# Patient Record
Sex: Female | Born: 1960
Health system: Southern US, Community
[De-identification: ages and names within clinical notes are randomized; demographics above are authoritative.]

## PROBLEM LIST (undated history)

## (undated) DIAGNOSIS — H905 Unspecified sensorineural hearing loss: Secondary | ICD-10-CM

## (undated) DIAGNOSIS — Z9221 Personal history of antineoplastic chemotherapy: Secondary | ICD-10-CM

## (undated) DIAGNOSIS — E785 Hyperlipidemia, unspecified: Secondary | ICD-10-CM

## (undated) DIAGNOSIS — Z923 Personal history of irradiation: Secondary | ICD-10-CM

## (undated) DIAGNOSIS — C50919 Malignant neoplasm of unspecified site of unspecified female breast: Secondary | ICD-10-CM

## (undated) DIAGNOSIS — Z973 Presence of spectacles and contact lenses: Secondary | ICD-10-CM

## (undated) HISTORY — DX: Personal history of antineoplastic chemotherapy: Z92.21

## (undated) HISTORY — PX: TONSILLECTOMY: SUR1361

## (undated) HISTORY — DX: Personal history of irradiation: Z92.3

## (undated) HISTORY — PX: AUGMENTATION MAMMAPLASTY: SUR837

## (undated) HISTORY — DX: Unspecified sensorineural hearing loss: H90.5

## (undated) HISTORY — DX: Malignant neoplasm of unspecified site of unspecified female breast: C50.919

## (undated) HISTORY — DX: Hyperlipidemia, unspecified: E78.5

---

## 1999-08-26 ENCOUNTER — Other Ambulatory Visit: Admission: RE | Admit: 1999-08-26 | Discharge: 1999-08-26 | Payer: Self-pay | Admitting: Obstetrics and Gynecology

## 2000-09-16 ENCOUNTER — Other Ambulatory Visit: Admission: RE | Admit: 2000-09-16 | Discharge: 2000-09-16 | Payer: Self-pay | Admitting: Obstetrics and Gynecology

## 2000-11-09 DIAGNOSIS — C50919 Malignant neoplasm of unspecified site of unspecified female breast: Secondary | ICD-10-CM

## 2000-11-09 HISTORY — PX: BREAST SURGERY: SHX581

## 2000-11-09 HISTORY — PX: BREAST LUMPECTOMY: SHX2

## 2000-11-09 HISTORY — DX: Malignant neoplasm of unspecified site of unspecified female breast: C50.919

## 2000-11-22 ENCOUNTER — Other Ambulatory Visit: Admission: RE | Admit: 2000-11-22 | Discharge: 2000-11-22 | Payer: Self-pay | Admitting: Surgery

## 2000-11-30 ENCOUNTER — Encounter: Admission: RE | Admit: 2000-11-30 | Discharge: 2001-02-28 | Payer: Self-pay | Admitting: Radiation Oncology

## 2000-12-03 ENCOUNTER — Encounter: Admission: RE | Admit: 2000-12-03 | Discharge: 2000-12-03 | Payer: Self-pay | Admitting: Surgery

## 2000-12-03 ENCOUNTER — Encounter: Payer: Self-pay | Admitting: Surgery

## 2000-12-08 ENCOUNTER — Encounter (INDEPENDENT_AMBULATORY_CARE_PROVIDER_SITE_OTHER): Payer: Self-pay | Admitting: Specialist

## 2000-12-08 ENCOUNTER — Ambulatory Visit (HOSPITAL_COMMUNITY): Admission: RE | Admit: 2000-12-08 | Discharge: 2000-12-09 | Payer: Self-pay | Admitting: Surgery

## 2000-12-08 ENCOUNTER — Encounter: Payer: Self-pay | Admitting: Surgery

## 2001-03-28 ENCOUNTER — Ambulatory Visit: Admission: RE | Admit: 2001-03-28 | Discharge: 2001-06-08 | Payer: Self-pay | Admitting: Radiation Oncology

## 2001-06-09 ENCOUNTER — Ambulatory Visit: Admission: RE | Admit: 2001-06-09 | Discharge: 2001-09-07 | Payer: Self-pay | Admitting: Radiation Oncology

## 2001-11-18 ENCOUNTER — Other Ambulatory Visit: Admission: RE | Admit: 2001-11-18 | Discharge: 2001-11-18 | Payer: Self-pay | Admitting: Obstetrics and Gynecology

## 2002-12-14 ENCOUNTER — Other Ambulatory Visit: Admission: RE | Admit: 2002-12-14 | Discharge: 2002-12-14 | Payer: Self-pay | Admitting: Obstetrics and Gynecology

## 2003-11-19 ENCOUNTER — Encounter: Admission: RE | Admit: 2003-11-19 | Discharge: 2003-11-19 | Payer: Self-pay | Admitting: Oncology

## 2004-01-31 ENCOUNTER — Other Ambulatory Visit: Admission: RE | Admit: 2004-01-31 | Discharge: 2004-01-31 | Payer: Self-pay | Admitting: Obstetrics and Gynecology

## 2004-09-19 ENCOUNTER — Ambulatory Visit: Payer: Self-pay | Admitting: Oncology

## 2004-11-19 ENCOUNTER — Ambulatory Visit: Payer: Self-pay | Admitting: Oncology

## 2005-01-28 ENCOUNTER — Inpatient Hospital Stay (HOSPITAL_COMMUNITY): Admission: AD | Admit: 2005-01-28 | Discharge: 2005-02-01 | Payer: Self-pay | Admitting: Obstetrics and Gynecology

## 2005-02-02 ENCOUNTER — Encounter: Admission: RE | Admit: 2005-02-02 | Discharge: 2005-03-04 | Payer: Self-pay | Admitting: Obstetrics and Gynecology

## 2005-03-06 ENCOUNTER — Other Ambulatory Visit: Admission: RE | Admit: 2005-03-06 | Discharge: 2005-03-06 | Payer: Self-pay | Admitting: Obstetrics and Gynecology

## 2005-03-18 ENCOUNTER — Ambulatory Visit: Payer: Self-pay | Admitting: Oncology

## 2005-04-07 ENCOUNTER — Encounter: Admission: RE | Admit: 2005-04-07 | Discharge: 2005-04-07 | Payer: Self-pay | Admitting: Surgery

## 2005-05-11 ENCOUNTER — Encounter: Admission: RE | Admit: 2005-05-11 | Discharge: 2005-05-11 | Payer: Self-pay | Admitting: Surgery

## 2005-07-07 ENCOUNTER — Ambulatory Visit: Payer: Self-pay | Admitting: Oncology

## 2006-01-05 ENCOUNTER — Ambulatory Visit: Payer: Self-pay | Admitting: Oncology

## 2006-05-18 ENCOUNTER — Encounter: Admission: RE | Admit: 2006-05-18 | Discharge: 2006-05-18 | Payer: Self-pay | Admitting: Oncology

## 2006-06-24 ENCOUNTER — Ambulatory Visit: Payer: Self-pay | Admitting: Oncology

## 2006-06-30 LAB — COMPREHENSIVE METABOLIC PANEL
AST: 14 U/L (ref 0–37)
Alkaline Phosphatase: 66 U/L (ref 39–117)
BUN: 17 mg/dL (ref 6–23)
CO2: 27 mEq/L (ref 19–32)
Chloride: 108 mEq/L (ref 96–112)
Total Bilirubin: 0.4 mg/dL (ref 0.3–1.2)
Total Protein: 6.7 g/dL (ref 6.0–8.3)

## 2006-06-30 LAB — CBC WITH DIFFERENTIAL/PLATELET
BASO%: 0.3 % (ref 0.0–2.0)
EOS%: 1.7 % (ref 0.0–7.0)
Eosinophils Absolute: 0.1 10*3/uL (ref 0.0–0.5)
HGB: 13.7 g/dL (ref 11.6–15.9)
MONO%: 8.5 % (ref 0.0–13.0)
WBC: 3.2 10*3/uL — ABNORMAL LOW (ref 3.9–10.0)

## 2007-05-23 ENCOUNTER — Encounter: Admission: RE | Admit: 2007-05-23 | Discharge: 2007-05-23 | Payer: Self-pay | Admitting: Surgery

## 2007-06-24 ENCOUNTER — Ambulatory Visit: Payer: Self-pay | Admitting: Oncology

## 2007-06-28 LAB — CBC WITH DIFFERENTIAL/PLATELET
EOS%: 3.7 % (ref 0.0–7.0)
Eosinophils Absolute: 0.2 10*3/uL (ref 0.0–0.5)
HCT: 41.5 % (ref 34.8–46.6)
LYMPH%: 22 % (ref 14.0–48.0)
MCHC: 35.4 g/dL (ref 32.0–36.0)
MONO#: 0.3 10*3/uL (ref 0.1–0.9)
NEUT#: 3.5 10*3/uL (ref 1.5–6.5)
Platelets: ADEQUATE 10*3/uL (ref 145–400)
RBC: 4.66 10*6/uL (ref 3.70–5.32)

## 2007-06-28 LAB — COMPREHENSIVE METABOLIC PANEL
ALT: 18 U/L (ref 0–35)
AST: 18 U/L (ref 0–37)
Alkaline Phosphatase: 83 U/L (ref 39–117)
Calcium: 9.5 mg/dL (ref 8.4–10.5)
Creatinine, Ser: 0.72 mg/dL (ref 0.40–1.20)
Potassium: 4.3 mEq/L (ref 3.5–5.3)
Sodium: 140 mEq/L (ref 135–145)

## 2007-07-20 ENCOUNTER — Encounter: Admission: RE | Admit: 2007-07-20 | Discharge: 2007-07-20 | Payer: Self-pay | Admitting: Oncology

## 2007-07-21 ENCOUNTER — Encounter: Admission: RE | Admit: 2007-07-21 | Discharge: 2007-07-21 | Payer: Self-pay | Admitting: Internal Medicine

## 2008-05-24 ENCOUNTER — Encounter: Admission: RE | Admit: 2008-05-24 | Discharge: 2008-05-24 | Payer: Self-pay | Admitting: Oncology

## 2008-06-24 ENCOUNTER — Ambulatory Visit: Payer: Self-pay | Admitting: Oncology

## 2008-06-27 LAB — COMPREHENSIVE METABOLIC PANEL
ALT: 18 U/L (ref 0–35)
AST: 15 U/L (ref 0–37)
Albumin: 4.1 g/dL (ref 3.5–5.2)
CO2: 26 mEq/L (ref 19–32)
Creatinine, Ser: 0.76 mg/dL (ref 0.40–1.20)
Glucose, Bld: 111 mg/dL — ABNORMAL HIGH (ref 70–99)
Potassium: 3.9 mEq/L (ref 3.5–5.3)

## 2008-06-27 LAB — CBC WITH DIFFERENTIAL/PLATELET
Eosinophils Absolute: 0.1 10*3/uL (ref 0.0–0.5)
HGB: 13.7 g/dL (ref 11.6–15.9)
MCH: 32.3 pg (ref 26.0–34.0)
MCHC: 35.3 g/dL (ref 32.0–36.0)
MCV: 91.6 fL (ref 81.0–101.0)
MONO%: 7.5 % (ref 0.0–13.0)
NEUT#: 2.1 10*3/uL (ref 1.5–6.5)
NEUT%: 61 % (ref 39.6–76.8)
RBC: 4.25 10*6/uL (ref 3.70–5.32)
WBC: 3.5 10*3/uL — ABNORMAL LOW (ref 3.9–10.0)
lymph#: 1 10*3/uL (ref 0.9–3.3)

## 2009-06-03 ENCOUNTER — Encounter: Admission: RE | Admit: 2009-06-03 | Discharge: 2009-06-03 | Payer: Self-pay | Admitting: Oncology

## 2009-06-20 ENCOUNTER — Ambulatory Visit: Payer: Self-pay | Admitting: Oncology

## 2009-06-24 LAB — COMPREHENSIVE METABOLIC PANEL
ALT: 19 U/L (ref 0–35)
AST: 16 U/L (ref 0–37)
Albumin: 4.3 g/dL (ref 3.5–5.2)
BUN: 15 mg/dL (ref 6–23)
CO2: 25 mEq/L (ref 19–32)
Calcium: 9.6 mg/dL (ref 8.4–10.5)
Chloride: 108 mEq/L (ref 96–112)
Sodium: 142 mEq/L (ref 135–145)
Total Bilirubin: 0.4 mg/dL (ref 0.3–1.2)
Total Protein: 6.8 g/dL (ref 6.0–8.3)

## 2009-06-24 LAB — CBC WITH DIFFERENTIAL/PLATELET
BASO%: 0.5 % (ref 0.0–2.0)
EOS%: 3.6 % (ref 0.0–7.0)
HGB: 13.9 g/dL (ref 11.6–15.9)
MCHC: 34.4 g/dL (ref 31.5–36.0)
MCV: 91.4 fL (ref 79.5–101.0)
MONO#: 0.3 10*3/uL (ref 0.1–0.9)
MONO%: 8.1 % (ref 0.0–14.0)
NEUT%: 51.7 % (ref 38.4–76.8)
RBC: 4.43 10*6/uL (ref 3.70–5.45)
WBC: 3.6 10*3/uL — ABNORMAL LOW (ref 3.9–10.3)
lymph#: 1.3 10*3/uL (ref 0.9–3.3)

## 2009-07-26 ENCOUNTER — Encounter: Admission: RE | Admit: 2009-07-26 | Discharge: 2009-07-26 | Payer: Self-pay | Admitting: Oncology

## 2010-06-10 ENCOUNTER — Encounter: Admission: RE | Admit: 2010-06-10 | Discharge: 2010-06-10 | Payer: Self-pay | Admitting: Obstetrics and Gynecology

## 2010-06-19 ENCOUNTER — Ambulatory Visit: Payer: Self-pay | Admitting: Oncology

## 2010-07-22 ENCOUNTER — Ambulatory Visit: Payer: Self-pay | Admitting: Oncology

## 2010-09-17 ENCOUNTER — Ambulatory Visit: Payer: Self-pay | Admitting: Oncology

## 2010-09-19 LAB — MORPHOLOGY: PLT EST: ADEQUATE

## 2010-09-19 LAB — CBC WITH DIFFERENTIAL/PLATELET
EOS%: 2.1 % (ref 0.0–7.0)
HCT: 41.8 % (ref 34.8–46.6)
MCH: 32.3 pg (ref 25.1–34.0)
MCHC: 34.6 g/dL (ref 31.5–36.0)
MONO#: 0.3 10*3/uL (ref 0.1–0.9)
MONO%: 6.7 % (ref 0.0–14.0)
NEUT%: 61.8 % (ref 38.4–76.8)
RDW: 12.7 % (ref 11.2–14.5)
WBC: 3.8 10*3/uL — ABNORMAL LOW (ref 3.9–10.3)

## 2010-11-29 ENCOUNTER — Other Ambulatory Visit: Payer: Self-pay | Admitting: Oncology

## 2010-11-29 DIAGNOSIS — Z1239 Encounter for other screening for malignant neoplasm of breast: Secondary | ICD-10-CM

## 2011-03-27 NOTE — Discharge Summary (Signed)
NAMEBRITAINY, KOZUB NO.:  [id number]   MEDICAL RECORD NO.:  [id number]          PATIENT TYPE:  INP   LOCATION:  9145                          FACILITY:  [facility]   PHYSICIAN:  Freddy Finner, M.D.   DATE OF BIRTH:  Jul 22, 1961   DATE OF ADMISSION:  01/28/2005  DATE OF DISCHARGE:  02/01/2005                                 DISCHARGE SUMMARY   ADMISSION DIAGNOSES:  1.  Intrauterine pregnancy at terms.  2. Spontaneous rupture of membranes.   DISCHARGE DIAGNOSES:  1.  Status post low transverse cesarean section secondary to cephalopelvic      disproportion.  2. Viable female infant.   PROCEDURE:  Primary low transverse cesarean section.   REASON FOR ADMISSION:  Please see written history and physical.   HOSPITAL COURSE:  The patient is a 50 year old primigravida that was  admitted to [facility] with spontaneous rupture of  membranes.  The patient was noted to have minimal labor.  The patient had  history of positive group B beta strep and IV antibiotics were administered  prophylactically.  Pitocin was started per protocol to augment her labor.  The patient was also administered epidural for patient's comfort.  Fetal  heart tones were in the 140s with good variability.  The patient did  progress to full dilatation.  After three hours of pushing, fetal vertex  failed to descend beyond a +2 station.  The decision was made to proceed  with primary low transverse cesarean section for the diagnosis of  cephalopelvic disproportion.  The patient was then transferred to the  operating room where epidural was dosed to an adequate surgical level.  The  low transverse incision was made with a delivery of a viable female infant  weighing 7 pounds 11 ounces with Apgars of 8 at 1 minute, 9 at 5 minutes.  The patient tolerated the procedure well and was taken to the recovery room  in stable condition.  Later that morning, the patient was transferred to the  mother/baby unit.  Vital signs were stable.  She was afebrile.  Abdomen was  soft.  Fundus was firm and nontender.  Abdominal dressing was noted to be  clean, dry, and intact.  Foley was to straight drain with adequate output.  On postoperative day #1, the patient was without complaint.  Vital signs  were stable.  Abdomen is soft.  Fundus is firm and nontender.  Abdominal  dressing was partially revealing incision that was clean, dry, and intact.   Laboratory findings revealed hemoglobin of 12.5, WBC count of 11.9, platelet  count of 177,000.  On postoperative day #2, the patient was without  complaint.  Vital signs remained stable.  Fundus was firm and nontender.  Incision was clean, dry, and intact.  She is ambulating well and tolerating  a regular diet without complaints of nausea or vomiting.  On postoperative  day #3, the patient was without complaints.  Vital signs remained stable.  She was afebrile.  Fundus was firm and nontender.  The incision was clean,  dry, and intact.  Staples  were removed.  The patient was discharged home.   CONDITION ON DISCHARGE:  Good.   DIET:  Regular as tolerated.   ACTIVITY:  No heavy lifting, no driving x2 weeks, no vaginal entry.   FOLLOW UP:  The patient is to follow up in the office in one to two weeks  for incision check.  She is to call for temperature greater than 100  degrees, persistent nausea or vomiting, heavy vaginal bleeding, and/or  redness or drainage from the incision site.   DISCHARGE MEDICATIONS:  1.  Percocet 5/325 #30, 1 p.o. every 4-6h. p.r.n.  2. Ibuprofen 600 mg every      6 hours.  3. Prenatal vitamins 1 p.o. daily.  4. Colace 1 p.o. daily.      CC/MEDQ  D:  03/06/2005  T:  03/06/2005  Job:  [id number]

## 2011-03-27 NOTE — Op Note (Signed)
NAMEBRITAIN, ANAGNOS NO.:  [id number]   MEDICAL RECORD NO.:  [id number]          PATIENT TYPE:  INP   LOCATION:  9145                          FACILITY:  [facility]   PHYSICIAN:  Duke Salvia. Marcelle Overlie, M.D.DATE OF BIRTH:  Apr 22, 1961   DATE OF PROCEDURE:  01/29/2005  DATE OF DISCHARGE:                                 OPERATIVE REPORT   PREOPERATIVE DIAGNOSIS:  Cephalopelvic disproportion, failure to descend.   POSTOPERATIVE DIAGNOSIS:  Cephalopelvic disproportion, failure to descend.   PROCEDURE:  Primary low transverse cesarean section.   SURGEON:  Duke Salvia. Marcelle Overlie, M.D.   ANESTHESIA:  Epidural.   COMPLICATIONS:  None.   DRAINS:  Foley catheter.   ESTIMATED BLOOD LOSS:  800.   PROCEDURE AND FINDINGS:  This patient had a three-hour second stage.  Increased caput was noted.  Stable fetal heart rate throughout.  Failure to  descend beyond +2 station.  A decision made to proceed with primary CS for  CPD.   The patient taken to the operating room.  After an adequate level of  epidural anesthesia was attained with the patient supine, the abdomen was  prepped and draped in the usual manner for sterile abdominal procedures.  A  Foley catheter was already positioned draining clear urine.  A transverse  incision was made two fingerbreadths above the symphysis, carried down to  the fascia, which was incised and extended transversely.  Rectus muscles  divided in the midline.  Peritoneum entered superiorly without incident and  extended in a vertical fashion.  The vesicouterine serosa was incised and  the bladder was bluntly and sharply dissected off of the lower uterine  segment.  The bladder blade was positioned.  A transverse incision made in  the lower segment, extended with blunt dissection.  Clear fluid was noted.  The patient then delivered of a healthy female.  Apgars were 9 and 9.  The  infant was suctioned, the cord clamped and passed to the pediatric team for  further care.  The placenta was then delivered manually intact, the uterus  exteriorized, the cavity wiped clean with a laparotomy pack.  Closure  obtained in the first layer with 0 chromic in a locked fashion, followed by  an imbricating layer of 0 chromic.  This was hemostatic.  The bladder flap  area was intact and hemostatic.  The tubes and ovaries were normal.  Prior  to closure the sponge, needle and instrument counts were reported as correct  x2.  The peritoneum was closed with running 3-0 Vicryl suture.  The rectus  muscles reapproximated with interrupted 3-0 Vicryl suture.  The fascia  closed from laterally to midline on either side with a 0 PDS suture.  The  subcutaneous fat was hemostatic, clips and Steri-Strips used on the skin.  Clear  urine noted at the end of the case.  The patient was already on ampicillin  IV for GBS prophylaxis, receiving her dose just prior to surgery.  Pitocin  was given IV after the cord was clamped also.  She went to the recovery room  in good  condition.      RMH/MEDQ  D:  01/29/2005  T:  01/29/2005  Job:  [id number]

## 2011-06-12 ENCOUNTER — Ambulatory Visit: Payer: Self-pay

## 2011-06-17 ENCOUNTER — Ambulatory Visit
Admission: RE | Admit: 2011-06-17 | Discharge: 2011-06-17 | Disposition: A | Discharge status: Home / Self Care | Payer: 59 | Source: Ambulatory Visit | Attending: Oncology | Admitting: Oncology

## 2011-06-17 DIAGNOSIS — Z1239 Encounter for other screening for malignant neoplasm of breast: Secondary | ICD-10-CM

## 2011-07-17 ENCOUNTER — Other Ambulatory Visit: Payer: Self-pay | Admitting: Oncology

## 2011-07-17 ENCOUNTER — Encounter (HOSPITAL_BASED_OUTPATIENT_CLINIC_OR_DEPARTMENT_OTHER): Payer: 59 | Admitting: Oncology

## 2011-07-17 DIAGNOSIS — D696 Thrombocytopenia, unspecified: Secondary | ICD-10-CM

## 2011-07-17 DIAGNOSIS — Z853 Personal history of malignant neoplasm of breast: Secondary | ICD-10-CM

## 2011-07-17 DIAGNOSIS — D72819 Decreased white blood cell count, unspecified: Secondary | ICD-10-CM

## 2011-07-17 LAB — COMPREHENSIVE METABOLIC PANEL
ALT: 27 U/L (ref 0–35)
Albumin: 4.1 g/dL (ref 3.5–5.2)
Alkaline Phosphatase: 86 U/L (ref 39–117)
Chloride: 104 mEq/L (ref 96–112)
Potassium: 4.2 mEq/L (ref 3.5–5.3)
Total Protein: 6.8 g/dL (ref 6.0–8.3)

## 2011-07-17 LAB — CBC WITH DIFFERENTIAL/PLATELET
BASO%: 0.3 % (ref 0.0–2.0)
HCT: 39 % (ref 34.8–46.6)
HGB: 13.5 g/dL (ref 11.6–15.9)
LYMPH%: 32.9 % (ref 14.0–49.7)
MCH: 31.5 pg (ref 25.1–34.0)
MCV: 90.9 fL (ref 79.5–101.0)
NEUT#: 2.2 10*3/uL (ref 1.5–6.5)
NEUT%: 58.5 % (ref 38.4–76.8)
RDW: 13.1 % (ref 11.2–14.5)
lymph#: 1.2 10*3/uL (ref 0.9–3.3)

## 2011-07-21 ENCOUNTER — Other Ambulatory Visit: Payer: Self-pay | Admitting: Oncology

## 2011-07-21 DIAGNOSIS — Z9889 Other specified postprocedural states: Secondary | ICD-10-CM

## 2011-07-21 DIAGNOSIS — Z853 Personal history of malignant neoplasm of breast: Secondary | ICD-10-CM

## 2011-08-21 ENCOUNTER — Other Ambulatory Visit: Payer: 59

## 2011-08-25 ENCOUNTER — Ambulatory Visit
Admission: RE | Admit: 2011-08-25 | Discharge: 2011-08-25 | Disposition: A | Discharge status: Home / Self Care | Payer: BC Managed Care – PPO | Source: Ambulatory Visit | Attending: Oncology | Admitting: Oncology

## 2011-08-25 DIAGNOSIS — Z9889 Other specified postprocedural states: Secondary | ICD-10-CM

## 2011-08-25 DIAGNOSIS — Z853 Personal history of malignant neoplasm of breast: Secondary | ICD-10-CM

## 2011-08-25 MED ORDER — GADOBENATE DIMEGLUMINE 529 MG/ML IV SOLN
14.0000 mL | Freq: Once | INTRAVENOUS | Status: AC | PRN
  Administered 2011-08-25: 14 mL via INTRAVENOUS

## 2011-12-12 ENCOUNTER — Telehealth: Payer: Self-pay | Admitting: Oncology

## 2011-12-12 NOTE — Telephone Encounter (Signed)
Called pt, talked to the mother, called sister and left message for the upcoming appt in March 3013, called interpreter, pt is deaf.

## 2012-01-26 ENCOUNTER — Ambulatory Visit: Payer: BC Managed Care – PPO | Admitting: Oncology

## 2012-01-26 ENCOUNTER — Other Ambulatory Visit: Payer: BC Managed Care – PPO | Admitting: Lab

## 2012-07-19 ENCOUNTER — Other Ambulatory Visit: Payer: Self-pay | Admitting: Internal Medicine

## 2012-07-19 DIAGNOSIS — Z1231 Encounter for screening mammogram for malignant neoplasm of breast: Secondary | ICD-10-CM

## 2012-08-10 ENCOUNTER — Ambulatory Visit
Admission: RE | Admit: 2012-08-10 | Discharge: 2012-08-10 | Disposition: A | Discharge status: Home / Self Care | Payer: BC Managed Care – PPO | Source: Ambulatory Visit | Attending: Internal Medicine | Admitting: Internal Medicine

## 2012-08-10 DIAGNOSIS — Z1231 Encounter for screening mammogram for malignant neoplasm of breast: Secondary | ICD-10-CM

## 2013-12-20 ENCOUNTER — Other Ambulatory Visit: Payer: Self-pay | Admitting: Obstetrics and Gynecology

## 2013-12-20 DIAGNOSIS — R928 Other abnormal and inconclusive findings on diagnostic imaging of breast: Secondary | ICD-10-CM

## 2014-01-01 ENCOUNTER — Ambulatory Visit
Admission: RE | Admit: 2014-01-01 | Discharge: 2014-01-01 | Disposition: A | Discharge status: Home / Self Care | Payer: BC Managed Care – PPO | Source: Ambulatory Visit | Attending: Obstetrics and Gynecology | Admitting: Obstetrics and Gynecology

## 2014-01-01 ENCOUNTER — Other Ambulatory Visit: Payer: Self-pay | Admitting: Obstetrics and Gynecology

## 2014-01-01 ENCOUNTER — Other Ambulatory Visit (HOSPITAL_COMMUNITY): Payer: Self-pay | Admitting: Diagnostic Radiology

## 2014-01-01 DIAGNOSIS — R928 Other abnormal and inconclusive findings on diagnostic imaging of breast: Secondary | ICD-10-CM

## 2014-01-02 ENCOUNTER — Other Ambulatory Visit: Payer: Self-pay | Admitting: Obstetrics and Gynecology

## 2014-01-02 DIAGNOSIS — C50919 Malignant neoplasm of unspecified site of unspecified female breast: Secondary | ICD-10-CM

## 2014-01-04 ENCOUNTER — Other Ambulatory Visit: Payer: BC Managed Care – PPO

## 2014-01-05 ENCOUNTER — Other Ambulatory Visit: Payer: BC Managed Care – PPO

## 2014-01-11 ENCOUNTER — Encounter (INDEPENDENT_AMBULATORY_CARE_PROVIDER_SITE_OTHER): Payer: Self-pay | Admitting: Surgery

## 2014-01-11 ENCOUNTER — Ambulatory Visit (INDEPENDENT_AMBULATORY_CARE_PROVIDER_SITE_OTHER): Payer: BC Managed Care – PPO | Admitting: Surgery

## 2014-01-11 VITALS — Temp 98.1°F | Ht 67.0 in | Wt 171.0 lb

## 2014-01-11 DIAGNOSIS — C50219 Malignant neoplasm of upper-inner quadrant of unspecified female breast: Secondary | ICD-10-CM

## 2014-01-11 DIAGNOSIS — C50919 Malignant neoplasm of unspecified site of unspecified female breast: Secondary | ICD-10-CM

## 2014-01-11 DIAGNOSIS — Z853 Personal history of malignant neoplasm of breast: Secondary | ICD-10-CM

## 2014-01-11 DIAGNOSIS — C50912 Malignant neoplasm of unspecified site of left female breast: Secondary | ICD-10-CM

## 2014-01-11 DIAGNOSIS — C50212 Malignant neoplasm of upper-inner quadrant of left female breast: Secondary | ICD-10-CM

## 2014-01-11 NOTE — Progress Notes (Addendum)
Re:   Regina Watts DOB:   17-Mar-1961 MRN:   [id number]  ASSESSMENT AND PLAN: 1.  Left breast cancer (T1, N0), approximately 1.5 cm.  At 10 o'clock.  Biopsy (Accession: [id number]) - IDC, 0% - ER, 0% - PR, Ki67- 66%, Her2Neu - neg  Plan:  1) Consult medical oncology (has seen Dr. Marko Plume), radiation oncology  (has seen Dr. Tammi Klippel), and genetics (I do not think that she got genetic testing the last time around), 2) Has MRI scheduled 01/13/2014, 3) See will back to review all recommendations.  She is a candidate for breast conservation, unless her genetics are abnormal, 4) I showed her a porta cath and explained its function.  I think that last time, she got her treatment with out it.  [MRI showed solitary left breast mass.  Genetic testing negative from [facility] on 02/20/2014.  To schedule surgery.  DN  02/21/2014]   2.  History of right breast cancer  Right breast lumpectomy and right axillary sentinel lymph node biopsy - 12/08/2000 - D. Navaeh Kehres  Invasive ductal carcinoma -T2, N0 - 4 cm cancer, 0/2 nodes, ER/PR - neg, Her2neu - positive  She had chemotx (adriamycin and cytoxan) by Dr. Salomon Mast, then was followed by Dr. Godfrey Pick.  Had RTx by Dr. Gari Crown.  She has been disease free on the right side and has been pleased with the results.  3.  Congenital deafness  Janetta Hora provided sign language  Chief Complaint  Patient presents with  . New Evaluation    eval new br ca/hx of br ca   REFERRING PHYSICIAN: No primary provider on file.  HISTORY OF PRESENT ILLNESS: Regina Watts is a 53 y.o. (DOB: Jan 07, 1961)  white  female whose primary care physician is No primary provider on file. and comes to me today for left breast cancer. Regina Watts is accompanied by Janetta Hora who provided sign language. Andreal is married, but her husband lives in [place].  It seems that they get together maybe once a month.  I am not sure that I have ever met her husband.  [name] is a former breast cancer  patient of mine.  Her original diagnosis was 2002. She had a T2, N0 right breast cancer treated with lumpectomy and SLNBx, chemotx supervised by Dr. Salomon Mast, and radiation therapy supervised by Dr. Gari Crown.  Her right breast cancer was ER/PR neg, but Her2Neu positive.  She received adriamycin and cytoxan.  I am guessing this is just before Herceptin became generally available. I last saw Regina Watts in June 2011.  [facility]. She has a 1.5 cm lesion at 10 o'clock on mammogram and [place].  She underwent a US guided biopsy the same day.   Past Medical History  Diagnosis Date  . Breast cancer JANUARY 2002    RIGHT BREAST T4 SENTINEL NODE NEGATIVE, HER2 3+, ER/PR NEGATIVE  . Congenital deafness   . Hyperlipidemia      Past Surgical History  Procedure Laterality Date  . Breast surgery      lumpectomy on right side      Current Outpatient Prescriptions  Medication Sig Dispense Refill  . rosuvastatin (CRESTOR) 10 MG tablet Take 10 mg by mouth daily.      . sertraline (ZOLOFT) 50 MG tablet Take 50 mg by mouth daily.       No current facility-administered medications for this visit.     No Known  Allergies  REVIEW OF SYSTEMS: Skin:  No history of rash.  No history of abnormal moles. Infection:  No history of hepatitis or HIV.  No history of MRSA. Neurologic:  Congenital deafness. Cardiac:  No history of hypertension. No history of heart disease.  No history of prior cardiac catheterization.  No history of seeing a cardiologist. Pulmonary:  Does not smoke cigarettes.  No asthma or bronchitis.  No OSA/CPAP.  Endocrine:  No diabetes. No thyroid disease. Gastrointestinal:  No history of stomach disease.  No history of liver disease.  No history of gall bladder disease.  No history of pancreas disease.  No history of colon disease. Urologic:  No history of kidney stones.  No history of bladder infections. Musculoskeletal:  No history of joint  or back disease. Hematologic:  No bleeding disorder.  No history of anemia.  Not anticoagulated. Psycho-social:  The patient is oriented.   The patient has no obvious psychologic or social impairment to understanding our conversation and plan.  SOCIAL and FAMILY HISTORY: Married.  Though odd story that her husband lives in [place]. She has one daughter, Regina Watts, who is 77 (born 2006). She is a stay at home mom. Carena is accompanied by Janetta Hora who provided sign language.  PHYSICAL EXAM: Temp(Src) 98.1 F (36.7 C) (Oral)  Ht '5\' 7"'  (1.702 m)  Wt 171 lb (77.565 kg)  BMI 26.78 kg/m2  General: WN WF who is alert and generally healthy appearing.  He communication is through the person signing. HEENT: Normal. Pupils equal. Neck: Supple. No mass.  No thyroid mass. Lymph Nodes:  No supraclavicular, cervical, or axillary node. Lungs: Clear to auscultation and symmetric breath sounds. Heart:  RRR. No murmur or rub. Breasts:  Right - scar at 12 o'clock.  Somewhat thickened.  Som pigmentation of right breast.  Left - Bruise at biopsy site at 10 o'clock. Abdomen: Soft. No mass. No tenderness. No hernia. Normal bowel sounds.  No abdominal scars. Rectal: Not done. Extremities:  Good strength and ROM  in upper and lower extremities. Neurologic:  Grossly intact to motor and sensory function. Psychiatric: Has normal mood and affect. Behavior is normal.   DATA REVIEWED: Epic notes.  Alphonsa Overall, MD,  [facility], [address]  [place], [place]    [place] Phone:  [phone] FAX:  [phone]

## 2014-01-12 ENCOUNTER — Encounter: Payer: Self-pay | Admitting: Radiation Oncology

## 2014-01-13 ENCOUNTER — Ambulatory Visit
Admission: RE | Admit: 2014-01-13 | Discharge: 2014-01-13 | Disposition: A | Discharge status: Home / Self Care | Payer: BC Managed Care – PPO | Source: Ambulatory Visit | Attending: Obstetrics and Gynecology | Admitting: Obstetrics and Gynecology

## 2014-01-13 DIAGNOSIS — C50919 Malignant neoplasm of unspecified site of unspecified female breast: Secondary | ICD-10-CM | POA: Diagnosis not present

## 2014-01-13 MED ORDER — GADOBENATE DIMEGLUMINE 529 MG/ML IV SOLN
14.0000 mL | Freq: Once | INTRAVENOUS | Status: AC | PRN
  Administered 2014-01-13: 14 mL via INTRAVENOUS

## 2014-01-15 ENCOUNTER — Ambulatory Visit
Admission: RE | Admit: 2014-01-15 | Discharge: 2014-01-15 | Disposition: A | Discharge status: Home / Self Care | Payer: BC Managed Care – PPO | Source: Ambulatory Visit | Attending: Radiation Oncology | Admitting: Radiation Oncology

## 2014-01-15 ENCOUNTER — Encounter: Payer: Self-pay | Admitting: Radiation Oncology

## 2014-01-15 VITALS — BP 125/76 | HR 81 | Temp 98.0°F | Resp 16 | Ht 67.0 in | Wt 174.4 lb

## 2014-01-15 DIAGNOSIS — C50212 Malignant neoplasm of upper-inner quadrant of left female breast: Secondary | ICD-10-CM

## 2014-01-15 DIAGNOSIS — E785 Hyperlipidemia, unspecified: Secondary | ICD-10-CM | POA: Diagnosis not present

## 2014-01-15 DIAGNOSIS — C50919 Malignant neoplasm of unspecified site of unspecified female breast: Secondary | ICD-10-CM | POA: Insufficient documentation

## 2014-01-15 DIAGNOSIS — Z923 Personal history of irradiation: Secondary | ICD-10-CM | POA: Insufficient documentation

## 2014-01-15 NOTE — Progress Notes (Signed)
See progress note under physician encounter. 

## 2014-01-15 NOTE — Progress Notes (Signed)
Radiation Oncology         [phone] ________________________________  Initial outpatient Consultation  Name: Regina Watts  MRN: [id number]  Date: 01/15/2014  DOB: [birth year]  DG:LOVFIE,PPIRJJ, MD  Shann Medal, MD   REFERRING PHYSICIAN: Shann Medal, MD  DIAGNOSIS: 53 year old woman with a 1.5 cm estrogen receptor negative invasive ductal carcinoma of the left breast, pending surgery and lymph node sampling - Clinical Stage I  HISTORY OF PRESENT ILLNESS::Regina Watts is a 53 y.o. female with previous right breast cancer who underwent screening mammogram on 12/14/13 at Dr. Ophelia Charter' office.  She was found to have a new small rounded density in the upper inner left breast.  On 2/23, she underwent compression spot views confirming the mass.  Ultrasound guided core needle biopsy showed invasive ductal carcinoma that was estrogen receptor negative.  Subsequent breast MRI shows that the abnormal left breast lesion is solitary and there are no abnormal appearing lymph nodes.  She presents today to discuss the findings and discuss treatment options.  Because of her previous breast cancer at age 69 and new breast cancer at age 71, she is scheduled to see a Dietitian for screening.  Once we have that information, she will be ready to decide about treatment.  PREVIOUS RADIATION THERAPY: Yes 50.4 Gy to the right breast and a boosted to 60.4 Gy for T2 N0 breast cancer in 2002  PAST MEDICAL HISTORY:  has a past medical history of Congenital deafness; Hyperlipidemia; Breast cancer (JANUARY 2002); Breast cancer (February 2015); radiation therapy; and antineoplastic chemo.    PAST SURGICAL HISTORY: Past Surgical History  Procedure Laterality Date  . Breast surgery      lumpectomy on right side  . Breast biopsy    . Breast biopsy      FAMILY HISTORY: family history includes Cancer in her maternal aunt and maternal grandfather; Heart disease in her mother; Hyperlipidemia in her mother;  Hypertension in her mother.  SOCIAL HISTORY:  reports that she quit smoking about 20 years ago. She has never used smokeless tobacco. She reports that she drinks alcohol. She reports that she does not use illicit drugs.  ALLERGIES: Review of patient's allergies indicates not on file.  MEDICATIONS:  Current Outpatient Prescriptions  Medication Sig Dispense Refill  . rosuvastatin (CRESTOR) 10 MG tablet Take 10 mg by mouth daily.      . sertraline (ZOLOFT) 50 MG tablet Take 50 mg by mouth daily.       No current facility-administered medications for this encounter.    REVIEW OF SYSTEMS:  A 15 point review of systems is documented in the electronic medical record. This was obtained by the nursing staff. However, I reviewed this with the patient to discuss relevant findings and make appropriate changes.  A comprehensive review of systems was negative.   PHYSICAL EXAM:  height is _0  (1.702 m) and weight is 174 lb 6.4 oz (79.107 kg). Her oral temperature is 98 F (36.7 C). Her blood pressure is 125/76 and her pulse is 81. Her respiration is 16 and oxygen saturation is 100%.   Exam deferred today, because I have an upper respiratory tract infection, so, all efforts were made to avoid spreading this to the patient.  KPS = 100  100 - Normal; no complaints; no evidence of disease. 90   - Able to carry on normal activity; minor signs or symptoms of disease. 80   - Normal activity with effort; some signs  or symptoms of disease. 75   - Cares for self; unable to carry on normal activity or to do active work. 60   - Requires occasional assistance, but is able to care for most of his personal needs. 50   - Requires considerable assistance and frequent medical care. 76   - Disabled; requires special care and assistance. 85   - Severely disabled; hospital admission is indicated although death not imminent. 62   - Very sick; hospital admission necessary; active supportive treatment necessary. 10   -  Moribund; fatal processes progressing rapidly. 0     - Dead  Karnofsky DA, Abelmann Malott, Craver LS and Burchenal [facility] [phone]) The use of the nitrogen mustards in the palliative treatment of carcinoma: with particular reference to bronchogenic carcinoma Cancer 1 634-56  LABORATORY DATA:  Lab Results  Component Value Date   WBC 3.7* 07/17/2011   HGB 13.5 07/17/2011   HCT 39.0 07/17/2011   MCV 90.9 07/17/2011   PLT 145 07/17/2011   Lab Results  Component Value Date   NA 141 07/17/2011   K 4.2 07/17/2011   CL 104 07/17/2011   CO2 25 07/17/2011   Lab Results  Component Value Date   ALT 27 07/17/2011   AST 21 07/17/2011   ALKPHOS 86 07/17/2011   BILITOT 0.4 07/17/2011     RADIOGRAPHY: Mr Breast Bilateral W Wo Contrast  01/15/2014   CLINICAL DATA:  Biopsy proven invasive ductal carcinoma in the 10 o'clock region of the left breast. History of right breast cancer status post lumpectomy in 2002.  EXAM: BILATERAL BREAST MRI WITH AND WITHOUT CONTRAST  LABS:  None.  TECHNIQUE: Multiplanar, multisequence MR images of both breasts were obtained prior to and following the intravenous administration of 63ml of MultiHance.  THREE-DIMENSIONAL MR IMAGE RENDERING ON INDEPENDENT WORKSTATION:  Three-dimensional MR images were rendered by post-processing of the original MR data on an independent workstation. The three-dimensional MR images were interpreted, and findings are reported in the following complete MRI report for this study. Three dimensional images were evaluated at the independent DynaCad workstation  COMPARISON:  Previous exams  FINDINGS: Breast composition: b.  Scattered fibroglandular tissue.  Background parenchymal enhancement: Moderate  Right breast: Lumpectomy changes are seen in the right breast. No abnormal enhancement.  Left breast: There is an rim enhancing spiculated mass with biopsy changes in middle third of the upper inner quadrant of the left breast measuring 1.7 x 2.0 x 1.8 cm. There is a signal void artifact in  the mass from the biopsy clip.  Lymph nodes: No abnormal appearing lymph nodes.  Ancillary findings:  None.  IMPRESSION: Solitary enhancing mass in the upper-inner quadrant of the left breast corresponding with the known invasive ductal carcinoma.  RECOMMENDATION: Treatment planning at left breast is recommended.  BI-RADS CATEGORY  6: Known biopsy-proven malignancy - appropriate action should be taken.   Electronically Signed   By: Lillia Mountain M.D.   On: 01/15/2014 10:59   Mm Digital Diagnostic Unilat L  01/02/2014   ADDENDUM REPORT: 01/02/2014 09:32  ADDENDUM: Corrected report: Original report was generated due to an error in Voice Recognition.  ACR Breast density category b: There are scattered areas of fibroglandular density.   Electronically Signed   By: Shon Hale M.D.   On: 01/02/2014 09:32   01/02/2014   CLINICAL DATA:  Possible left breast mass at recent screening mammography.  EXAM: DIGITAL DIAGNOSTIC  LEFT MAMMOGRAM  ULTRASOUND LEFT BREAST  COMPARISON:  Previous  examinations, including the screening mammogram dated 12/14/2013.  ACR Breast Density Category d: The breast tissue is extremely dense, which lowers the sensitivity of mammography.  FINDINGS: Spot compression views of the left breast confirm an oval mass with some circumscribed and some mildly irregular and poorly defined margins. This is located in the upper inner quadrant of the breast, posteriorly.  On physical exam, there is a faintly palpable 1.5 cm oval area of focal thickening in the deep 10 o'clock position of the left breast, 9 cm from the nipple. No palpable left axillary lymph nodes.  Ultrasound is performed, showing a 1.5 x 1.4 x 1.3 cm oval, vertically oriented, hypoechoic mass deep in the 10 o'clock position of the left breast, 9 cm from the nipple. This has a mildly irregular and mildly ill-defined margins. No abnormal appearing left axillary lymph nodes.  IMPRESSION: 1.5 cm mass in the 10 o'clock position of the left breast  with imaging features suspicious for malignancy. Ultrasound-guided core needle biopsy is recommended and is scheduled to follow.  RECOMMENDATION: Left breast ultrasound-guided core needle biopsy (scheduled to follow).  I have discussed the findings and recommendations with the patient. Results were also provided in writing at the conclusion of the visit. If applicable, a reminder letter will be sent to the patient regarding the next appointment.  BI-RADS CATEGORY  4: Suspicious abnormality - biopsy should be considered.  Electronically Signed: By: Enrique Sack M.D. On: 01/01/2014 10:12   Mm Digital Diagnostic Unilat L  01/01/2014   CLINICAL DATA:  Status post ultrasound-guided core needle biopsy of a 1.5 cm mass deep in the 10 o'clock position of the left breast.  EXAM: POST-BIOPSY CLIP PLACEMENT LEFT DIAGNOSTIC MAMMOGRAM  COMPARISON:  Previous exams.  FINDINGS: Films are performed following ultrasound guided biopsy of a 1.5 cm mass deep in the 10 o'clock position of the left breast, 9 cm from the nipple. These demonstrate a coil shaped biopsy marker clip within the biopsied mass.  IMPRESSION: Appropriate clip deployment following left breast ultrasound-guided core needle biopsy.  Final Assessment: Post Procedure Mammograms for Marker Placement   Electronically Signed   By: Enrique Sack M.D.   On: 01/01/2014 10:43   US Breast Ltd Uni Left Inc Axilla  01/02/2014   ADDENDUM REPORT: 01/02/2014 09:32  ADDENDUM: Corrected report: Original report was generated due to an error in Voice Recognition.  ACR Breast density category b: There are scattered areas of fibroglandular density.   Electronically Signed   By: Shon Hale M.D.   On: 01/02/2014 09:32   01/02/2014   CLINICAL DATA:  Possible left breast mass at recent screening mammography.  EXAM: DIGITAL DIAGNOSTIC  LEFT MAMMOGRAM  ULTRASOUND LEFT BREAST  COMPARISON:  Previous examinations, including the screening mammogram dated 12/14/2013.  ACR Breast Density  Category d: The breast tissue is extremely dense, which lowers the sensitivity of mammography.  FINDINGS: Spot compression views of the left breast confirm an oval mass with some circumscribed and some mildly irregular and poorly defined margins. This is located in the upper inner quadrant of the breast, posteriorly.  On physical exam, there is a faintly palpable 1.5 cm oval area of focal thickening in the deep 10 o'clock position of the left breast, 9 cm from the nipple. No palpable left axillary lymph nodes.  Ultrasound is performed, showing a 1.5 x 1.4 x 1.3 cm oval, vertically oriented, hypoechoic mass deep in the 10 o'clock position of the left breast, 9 cm from the nipple. This  has a mildly irregular and mildly ill-defined margins. No abnormal appearing left axillary lymph nodes.  IMPRESSION: 1.5 cm mass in the 10 o'clock position of the left breast with imaging features suspicious for malignancy. Ultrasound-guided core needle biopsy is recommended and is scheduled to follow.  RECOMMENDATION: Left breast ultrasound-guided core needle biopsy (scheduled to follow).  I have discussed the findings and recommendations with the patient. Results were also provided in writing at the conclusion of the visit. If applicable, a reminder letter will be sent to the patient regarding the next appointment.  BI-RADS CATEGORY  4: Suspicious abnormality - biopsy should be considered.  Electronically Signed: By: Enrique Sack M.D. On: 01/01/2014 10:12   [place] Lt Breast Bx W Loc Dev 1st Lesion Img Bx Spec US Guide  01/02/2014   ADDENDUM REPORT: 01/02/2014 11:29  ADDENDUM: PATHOLOGY ADDENDUM:  Pathology left breast: Invasive ductal carcinoma  Pathology concordance with imaging findings: Yes  Recommendation: Surgical consultation regarding treatment plan. The patient is scheduled to see Dr. Lucia Gaskins for Surgical consultation on 01/11/2014. MRI has been scheduled for the patient on 01/05/2014.  The patient is deaf. I spoke with her by  telephone at her request through an sign language interpreter on 01/02/2014 at 11:27. All questions were answered. She reports doing well after the biopsy . She reports no significant pain or bleeding at the biopsy site.  I requested that the patient come to the [facility] to pick up educational materials at her convenience.   Electronically Signed   By: Shon Hale M.D.   On: 01/02/2014 11:29   01/02/2014   CLINICAL DATA:  1.5 cm mass in the 10 o'clock position of the left breast with imaging features suspicious for malignancy.  EXAM: ULTRASOUND GUIDED LEFT BREAST CORE NEEDLE BIOPSY WITH VACUUM ASSIST  COMPARISON:  Previous exams.  PROCEDURE: I met with the patient and we discussed the procedure of ultrasound-guided biopsy, including benefits and alternatives. We discussed the high likelihood of a successful procedure. We discussed the risks of the procedure including infection, bleeding, tissue injury, clip migration, and inadequate sampling. Informed written consent was given. The usual time-out protocol was performed immediately prior to the procedure.  Using sterile technique and 2% Lidocaine as local anesthetic, under direct ultrasound visualization, a 12 gauge vacuum-assisteddevice was used to perform biopsy of the recently demonstrated 1.5 cm mass in the 10 o'clock position of the left breast using an inferior approach. At the conclusion of the procedure, a coil shaped tissue marker clip was deployed into the biopsy cavity. Follow-up 2-view mammogram was performed and dictated separately.  IMPRESSION: Ultrasound-guided biopsy of a 1.5 cm 10 o'clock left breast mass. No apparent complications.  Electronically Signed: By: Enrique Sack M.D. On: 01/01/2014 10:44      IMPRESSION: This patient is a very nice 53 year old woman with a new left-sided breast cancer which is apparently clinical stage IA, pending surgical intervention. She had a right-sided T2 N0 breast cancer successfully treated with breast  conservation in 2002 at the age of 80. Her development of metachronous bilateral breast cancers at age 60 or less raises some concern for a germline mutation on BRCA one or 2. This is currently being set up for testing with genetic counseling. Regardless of her BRCA status, patient appears to be a candidate for left breast conservation including radiotherapy versus mastectomy. Patients with documented BRCA germ line mutations do not have a higher risk of local recurrence then age-matched sporadic breast cancers in younger women.  PLAN:Today, I talked to the patient through a sign language interpreter about the findings and work-up thus far.  We discussed the natural history of early stage breast cancer and general treatment, highlighting the role or radiotherapy in the management. She has a good recollection of her previous right breast radiotherapy. We discussed the available radiation techniques, and focused on the details of logistics and delivery.  We reviewed the anticipated acute and late sequelae associated with radiation in this setting.  The patient was encouraged to ask questions that I answered to the best of my ability.  I filled out a patient counseling form during our discussion including treatment diagrams.  We retained a copy for our records.    We spent some time today talking about her upcoming visits to genetic counseling and the impact of this on treatment decisions. She understands that some experts would recommend that she strongly consider bilateral mastectomies in the event that she is a BRCA germline mutation carrier. However, there is compelling evidence that patients who have undergone breast conserving treatment experience local recurrence, can be successfully salvaged with mastectomy to avoid any impact on overall survival. In some series, the risk for local recurrence is no higher than sporadic breast cancers in age-matched controls. In other series, the risk has been quoted to be  as high as 25% which obviously represents a small minority of cases. With this in mind, women who prefer to pursue breast conservation should be encouraged to consider the statistics as they face this decision.  The patient would like to proceed with genetic counseling. I will look forward followup on her progress during the next few weeks..  I spent 60 minutes minutes face to face with the patient and more than 50% of that time was spent in counseling and/or coordination of care.     ------------------------------------------------  Sheral Apley. Tammi Klippel, M.D.

## 2014-01-15 NOTE — Progress Notes (Signed)
Location of Breast Cancer:Left breast cancer, T1N0, approximately 1.5 cm at 10 o'clock  Histology per Pathology Report:    Did patient present with symptoms (if so, please note symptoms) or was this found on screening mammography? Found on routine screening  Past/Anticipated interventions by surgeon, if any:hx of right sided lumpectomy and Dr. Lucia Gaskins planning breast conservation surgery if genetics are NOT abnormal   Past/Anticipated interventions by medical oncology, if any: Chemotherapy in 2002 adriamycin and cytoxan  Lymphedema issues, if any:    Pain issues, if any:    SAFETY ISSUES:  Prior radiation? YES  Pacemaker/ICD? NO  Possible current pregnancy?NO  Is the patient on methotrexate? NO  Current Complaints / other details:  53 year old. Deaf. Married.

## 2014-01-15 NOTE — Progress Notes (Signed)
Patient deaf. Interpretor not present. Obtained information from patient by writing questions and she writing a response back. Patient reports NKDA. Denies pain but, does report her left elbow has been stiff from some time. No edema of either upper extremity noted. Reports biopsy scar is well healed without redness, pain or edema. Denies nipple discharge. Reports night sweats and nausea are very seldom. Denies weight loss or vomiting. Patient does not have a follow up appointment scheduled with either [facility] or surgeon at this time. Patient reports she understands Marko Plume is on vacation this week. Patient anxious to review MRI and pathology results. Patient wants to save her breast if she can. Demonstrated ability to raise arms above her head with restriction or pain. Denies breast pain or tenderness.

## 2014-01-17 ENCOUNTER — Telehealth: Payer: Self-pay | Admitting: *Deleted

## 2014-01-17 ENCOUNTER — Other Ambulatory Visit: Payer: Self-pay | Admitting: Oncology

## 2014-01-17 DIAGNOSIS — C50912 Malignant neoplasm of unspecified site of left female breast: Secondary | ICD-10-CM

## 2014-01-17 NOTE — Progress Notes (Unsigned)
Medical Oncology  Patient presented today at multidisciplinary breast conference; discussed directly with Dr Lucia Gaskins. Request sent to schedulers for appointment with this MD, and to see genetics counselor. Needs sign language interpreter with visits.  Godfrey Pick, MD

## 2014-01-17 NOTE — Telephone Encounter (Signed)
Pt returned my call and I confirmed 01/23/14 genetic appt w/ pt.  Unable to schedule Karen's appt - emailed Audie Clear to enter it.  Emailed Santiago Glad to make aware.  Emailed Christy at [employer] to make aware.

## 2014-01-17 NOTE — Telephone Encounter (Signed)
Left a message with the service center for pt to contact me to schedule a genetic appt.

## 2014-01-18 ENCOUNTER — Other Ambulatory Visit: Payer: Self-pay | Admitting: Oncology

## 2014-01-18 ENCOUNTER — Telehealth: Payer: Self-pay | Admitting: Oncology

## 2014-01-18 NOTE — Telephone Encounter (Signed)
, °

## 2014-01-21 ENCOUNTER — Other Ambulatory Visit: Payer: Self-pay | Admitting: Oncology

## 2014-01-22 ENCOUNTER — Other Ambulatory Visit (HOSPITAL_BASED_OUTPATIENT_CLINIC_OR_DEPARTMENT_OTHER): Payer: BC Managed Care – PPO

## 2014-01-22 ENCOUNTER — Telehealth: Payer: Self-pay | Admitting: Oncology

## 2014-01-22 ENCOUNTER — Ambulatory Visit (HOSPITAL_BASED_OUTPATIENT_CLINIC_OR_DEPARTMENT_OTHER): Payer: Medicare Other | Admitting: Oncology

## 2014-01-22 ENCOUNTER — Encounter: Payer: Self-pay | Admitting: Oncology

## 2014-01-22 VITALS — BP 147/88 | HR 65 | Temp 98.3°F | Resp 18 | Ht 67.0 in | Wt 172.9 lb

## 2014-01-22 DIAGNOSIS — C50212 Malignant neoplasm of upper-inner quadrant of left female breast: Secondary | ICD-10-CM

## 2014-01-22 DIAGNOSIS — C50219 Malignant neoplasm of upper-inner quadrant of unspecified female breast: Secondary | ICD-10-CM

## 2014-01-22 DIAGNOSIS — Z853 Personal history of malignant neoplasm of breast: Secondary | ICD-10-CM

## 2014-01-22 DIAGNOSIS — Z171 Estrogen receptor negative status [ER-]: Secondary | ICD-10-CM | POA: Diagnosis not present

## 2014-01-22 DIAGNOSIS — C50912 Malignant neoplasm of unspecified site of left female breast: Secondary | ICD-10-CM

## 2014-01-22 LAB — COMPREHENSIVE METABOLIC PANEL (CC13)
ALT: 65 U/L — AB (ref 0–55)
AST: 40 U/L — AB (ref 5–34)
Albumin: 4.1 g/dL (ref 3.5–5.0)
Alkaline Phosphatase: 99 U/L (ref 40–150)
Anion Gap: 9 mEq/L (ref 3–11)
BILIRUBIN TOTAL: 0.52 mg/dL (ref 0.20–1.20)
BUN: 13.3 mg/dL (ref 7.0–26.0)
CHLORIDE: 106 meq/L (ref 98–109)
CO2: 25 mEq/L (ref 22–29)
CREATININE: 0.8 mg/dL (ref 0.6–1.1)
Calcium: 9.3 mg/dL (ref 8.4–10.4)
Glucose: 136 mg/dl (ref 70–140)
Potassium: 3.9 mEq/L (ref 3.5–5.1)
SODIUM: 141 meq/L (ref 136–145)
Total Protein: 7.1 g/dL (ref 6.4–8.3)

## 2014-01-22 LAB — CBC WITH DIFFERENTIAL/PLATELET
BASO%: 0.4 % (ref 0.0–2.0)
Basophils Absolute: 0 10*3/uL (ref 0.0–0.1)
EOS%: 3.7 % (ref 0.0–7.0)
Eosinophils Absolute: 0.1 10*3/uL (ref 0.0–0.5)
HEMATOCRIT: 42 % (ref 34.8–46.6)
HGB: 13.9 g/dL (ref 11.6–15.9)
LYMPH#: 1.1 10*3/uL (ref 0.9–3.3)
LYMPH%: 27.1 % (ref 14.0–49.7)
MCH: 31.1 pg (ref 25.1–34.0)
MCHC: 33.2 g/dL (ref 31.5–36.0)
MCV: 93.7 fL (ref 79.5–101.0)
MONO#: 0.3 10*3/uL (ref 0.1–0.9)
MONO%: 7.9 % (ref 0.0–14.0)
NEUT#: 2.4 10*3/uL (ref 1.5–6.5)
NEUT%: 60.9 % (ref 38.4–76.8)
Platelets: 141 10*3/uL — ABNORMAL LOW (ref 145–400)
RBC: 4.48 10*6/uL (ref 3.70–5.45)
RDW: 12.9 % (ref 11.2–14.5)
WBC: 3.9 10*3/uL (ref 3.9–10.3)

## 2014-01-22 NOTE — Telephone Encounter (Signed)
, °

## 2014-01-22 NOTE — Progress Notes (Signed)
OFFICE PROGRESS NOTE   01/22/2014   Physicians: Alphonsa Overall, Tyler Pita, Karrar Husein, Arvella Nigh, Earle Gell  Patient is seen, together with sign language interpreter, for the first time back at this office since 07-17-2011, now with new diagnosis of triple negative left breast cancer. She has had biopsy done, has seen Dr Alphonsa Overall and is scheduled to meed with genetics counselor on 01-23-14. Case was presented at multidisciplinary breast conference last week; Dr Lucia Gaskins and I have discussed at that conference and subsequently. History is significant for T2N0 ER / PR negative, HER 2 + right breast cancer in 12-2000 when she was age 53.  I believe that she declined genetics testing after that cancer. She had infertility treatment in 2005 with successful pregnancy, daughter Regina Watts now 78.  Patient had bilateral mammograms at Dr [facility] office early 12-2013, with area questioned on left as compared with last prior mammograms of 08-2012. She had diagnostic left mammogram and [place] at [facility] on 01-01-14 which showed a mass in left upper inner breast posteriorly 1.5 x 1.4 x 1.3 cm on [place], with no abnormal left axillary nodes identified. She had biopsy on 01-01-14 with pathology ([id number]) showing grade 1-2 invasive ductal carcinoma ER, PR and HER 2 negative with Ki 66%. She had breast MRI 01-15-2014 with solitary enhancing mass upper inner left breast 1.7 x 2.0 x 1.8 cm, right breast not remarkable, no abnormal appearing lymph nodes and no mention of upper abdomen. Dr Lucia Gaskins prefers to wait on genetic testing information before deciding on type of surgery. She had consultation with Dr Tammi Klippel on 01-15-2014  Patient has not been aware of any changes in either breast. She has felt entirely well as usual, with exception of point tenderness lateral left elbow x 1-2 weeks which is noticeable when she lifts.    ONCOLOGIC HISTORY Initial records re right breast cancer diagnosis and treatment are on  microfilm and have been requested.  Notes in [place] include information that the right breast cancer was diagnosed February 2002 when patient was age 53 and premenopausal, T2N0M0 ER negative, PR negative, HER 2 positive. She had lumpectomy, adjuvant chemotherapy by Dr Starr Sinclair with adriamycin cytoxan x 4 cycles from March 2002 thru May 2002  (chemo flowsheets are not in [place]), and local radiation (50.4 Gy with boost to 60.4) Gy Note she declined PAC and received the chemotherapy in 2002 by peripheral vein. I followed her beginning Sept 2003, after Dr Pearlie Oyster left [place], until 07-2011.  She declined TEACH trial with lapatinib in 2007 and 2008. Last breast MRI previously was 08-2011.  Patient had infertility treatments at [facility] in 2005, daughter born 02-01-2005.   Review of systems as above, also: No recent infectious illness. No HA. No dental concerns, up to date on dental exams. No environmental allergies. No respiratory symptoms. No pain other than left elbow, no known trauma. Appetite good, no GERD, no nausea, no other GI symptoms, no change in bowels. No abdominal or pelvic discomfort. Menses irregular prior to pregnancy and stopped after pregnancy. No vaginal or other bleeding. No LE swelling. No neurologic symptoms.  Remainder of 10 point Review of Systems negative.  Past Medical/ Surgical History Congenital deafness Right breast cancer 2002 Infertility treatments 2005, successful pregnancy.  C section 01-2005 Colonoscopy ~ Jan 2015, which patient tells me was not remarkable  Family History Cardiac disease in mother No breast cancer history in family   Social History Lives in [place] with 32 yo daughter Regina Watts, who is  in third grade. Her husband, who is partially deaf, lives in [place] and is employed as Orthoptist; he generally has 3 weeks off per year which he spends in [place], including next week. Regina Watts no longer works outside of home. Keshia's mother lives  locally. Past tobacco DCd 20 years ago. No drugs. Occasional ETOH   Objective:  Vital signs in last 24 hours:  BP 147/88  Pulse 65  Temp(Src) 98.3 F (36.8 C) (Oral)  Resp 18  Ht 5' 7" (1.702 m)  Wt 172 lb 14.4 oz (78.427 kg)  BMI 27.07 kg/m2  SpO2 97%  Alert, oriented and appropriate, a little tearful at times. Ambulatory without difficulty. Respirations not labored RA. Looks comfortable.   HEENT:PERRL, sclerae not icteric. Oral mucosa moist without lesions, posterior pharynx clear. Normal hair pattern. Neck supple. No JVD.  Lymphatics:no cervical,suraclavicular, axillary or inguinal adenopathy Resp: clear to auscultation bilaterally and normal percussion bilaterally Cardio: regular rate and rhythm. No gallop. GI: soft, nontender, not distended, no appreciable mass or organomegaly. Normally active bowel sounds.  Musculoskeletal/ Extremities: without pitting edema, cords, tenderness other than lateral aspect left elbow without bruise, swelling, erythema.  Neuro: no peripheral neuropathy.  Deafness, otherwise nonfocal Skin without rash, ecchymosis, petechiae Breasts: Right with well healed superior lumpectomy scar,without dominant mass, skin or nipple findings. Right axilla benign. Left breast with biopsy site noted, minimal resolving ecchymosis, no clear mass that I can appreciate in area of biopsy site or otherwise, no skin or nipple findings of concern and left axilla negative. No swelling either UE.   Lab Results:  Results for orders placed in visit on 01/22/14  CBC WITH DIFFERENTIAL      Result Value Ref Range   WBC 3.9  3.9 - 10.3 10e3/uL   NEUT# 2.4  1.5 - 6.5 10e3/uL   HGB 13.9  11.6 - 15.9 g/dL   HCT 42.0  34.8 - 46.6 %   Platelets 141 (*) 145 - 400 10e3/uL   MCV 93.7  79.5 - 101.0 fL   MCH 31.1  25.1 - 34.0 pg   MCHC 33.2  31.5 - 36.0 g/dL   RBC 4.48  3.70 - 5.45 10e6/uL   RDW 12.9  11.2 - 14.5 %   lymph# 1.1  0.9 - 3.3 10e3/uL   MONO# 0.3  0.1 - 0.9 10e3/uL    Eosinophils Absolute 0.1  0.0 - 0.5 10e3/uL   Basophils Absolute 0.0  0.0 - 0.1 10e3/uL   NEUT% 60.9  38.4 - 76.8 %   LYMPH% 27.1  14.0 - 49.7 %   MONO% 7.9  0.0 - 14.0 %   EOS% 3.7  0.0 - 7.0 %   BASO% 0.4  0.0 - 2.0 %  COMPREHENSIVE METABOLIC PANEL (JO83)      Result Value Ref Range   Sodium 141  136 - 145 mEq/L   Potassium 3.9  3.5 - 5.1 mEq/L   Chloride 106  98 - 109 mEq/L   CO2 25  22 - 29 mEq/L   Glucose 136  70 - 140 mg/dl   BUN 13.3  7.0 - 26.0 mg/dL   Creatinine 0.8  0.6 - 1.1 mg/dL   Total Bilirubin 0.52  0.20 - 1.20 mg/dL   Alkaline Phosphatase 99  40 - 150 U/L   AST 40 (*) 5 - 34 U/L   ALT 65 (*) 0 - 55 U/L   Total Protein 7.1  6.4 - 8.3 g/dL   Albumin 4.1  3.5 -  5.0 g/dL   Calcium 9.3  8.4 - 10.4 mg/dL   Anion Gap 9  3 - 11 mEq/L  Chemistries resulted after visit, AST 40 and ALT 65  Markers sent for baseline and resulted after visit:  CEA   1.0 CA 2729   27  Studies/Results: Screening mammogram was done at Dr [facility] office, that report not in this EMR.   EXAM: 01-01-14 DIGITAL DIAGNOSTIC LEFT MAMMOGRAM  ULTRASOUND LEFT BREAST  COMPARISON: Previous examinations, including the screening  mammogram dated 12/14/2013.  ACR Breast Density Category d: The breast tissue is extremely dense,  which lowers the sensitivity of mammography.  FINDINGS:  Spot compression views of the left breast confirm an oval mass with  some circumscribed and some mildly irregular and poorly defined  margins. This is located in the upper inner quadrant of the breast,  posteriorly.  On physical exam, there is a faintly palpable 1.5 cm oval area of  focal thickening in the deep 10 o'clock position of the left breast,  9 cm from the nipple. No palpable left axillary lymph nodes.  Ultrasound is performed, showing a 1.5 x 1.4 x 1.3 cm oval,  vertically oriented, hypoechoic mass deep in the 10 o'clock position  of the left breast, 9 cm from the nipple. This has a mildly  irregular and  mildly ill-defined margins. No abnormal appearing left  axillary lymph nodes.  IMPRESSION:  1.5 cm mass in the 10 o'clock position of the left breast with  imaging features suspicious for malignancy. Ultrasound-guided core  needle biopsy is recommended and is scheduled to follow.  RECOMMENDATION:  Left breast ultrasound-guided core needle biopsy (scheduled to  follow).  I have discussed the findings and recommendations with the patient.  Results were also provided in writing at the conclusion of the  visit. If applicable, a reminder letter will be sent to the patient  regarding the next appointment.  BI-RADS CATEGORY 4: Suspicious abnormality - biopsy should be  considered.    EXAM: 01-01-14 POST-BIOPSY CLIP PLACEMENT LEFT DIAGNOSTIC MAMMOGRAM  COMPARISON: Previous exams.  FINDINGS:  Films are performed following ultrasound guided biopsy of a 1.5 cm  mass deep in the 10 o'clock position of the left breast, 9 cm from  the nipple. These demonstrate a coil shaped biopsy marker clip  within the biopsied mass.  IMPRESSION:  Appropriate clip deployment following left breast ultrasound-guided  core needle biopsy.    EXAM: 01-15-2014 BILATERAL BREAST MRI WITH AND WITHOUT CONTRAST   COMPARISON: Previous exams  FINDINGS:  Breast composition: b. Scattered fibroglandular tissue.  Background parenchymal enhancement: Moderate  Right breast: Lumpectomy changes are seen in the right breast. No  abnormal enhancement.  Left breast: There is an rim enhancing spiculated mass with biopsy  changes in middle third of the upper inner quadrant of the left  breast measuring 1.7 x 2.0 x 1.8 cm. There is a signal void artifact  in the mass from the biopsy clip.  Lymph nodes: No abnormal appearing lymph nodes.  Ancillary findings: None.  IMPRESSION:  Solitary enhancing mass in the upper-inner quadrant of the left  breast corresponding with the known invasive ductal  carcinoma.     PATHOLOGY  Accession: [id number] Received: 01/01/2014  SURGICAL PATHOLOGY ADDITIONAL INFORMATION: PROGNOSTIC INDICATORS - ACIS Results: IMMUNOHISTOCHEMICAL AND MORPHOMETRIC ANALYSIS BY THE AUTOMATED CELLULAR IMAGING SYSTEM (ACIS) Estrogen Receptor: 0%, NEGATIVE Progesterone Receptor: 0%, NEGATIVE Proliferation Marker Ki67: 66% COMMENT: The negative hormone receptor study(ies) in this case have an internal positive control.  REFERENCE RANGE ESTROGEN RECEPTOR NEGATIVE <1% POSITIVE =>1% PROGESTERONE RECEPTOR NEGATIVE <1% POSITIVE =>1% All controls stained appropriately  CHROMOGENIC IN-SITU HYBRIDIZATION Results: HER-2/NEU BY CISH - NO AMPLIFICATION OF HER-2 DETECTED. RESULT RATIO OF HER2: CEP 17 SIGNALS 0.85 AVERAGE HER2 COPY NUMBER PER CELL 1.85  REFERENCE RANGE NEGATIVE HER2/Chr17 Ratio <2.0 and Average HER2 copy number <4.0 EQUIVOCAL HER2/Chr17 Ratio <2.0 and Average HER2 copy number 4.0 and <6.0 POSITIVE HER2/Chr17 Ratio >=2.0 and/or Average HER2 copy number >=6.0 Claudette Laws MD Pathologist, Electronic Signature ( Signed 01/05/2014) FINAL DIAGNOSIS Diagnosis Breast, left, needle core biopsy, mass, 10 o'clock - INVASIVE DUCTAL CARCINOMA. - SEE COMMENT. Microscopic Comment Although definitive grading of breast carcinoma is best done on excision, the features of the invasive tumor from the left needle core biopsy at 10 o'clock are compatible with a grade I-II breast carcinoma. Breast prognostic markers will be performed and reported in an addendum. Findings are called to the [place] on 01/02/14. Dr. Donato Heinz has seen this case in consultation with agreement. (RAH:gt, 01/02/14)   Medications: I have reviewed the patient's current medications.  DISCUSSION: with excellent assistance from sign language interpreter, we have carefully discussed all of information available at time of visit, including past breast cancer features and treatment  history,  imaging findings including on MRI from 01-15-14 as well as the other recent breast imaging, and information about triple negative breast cancers. I have explained that genetics information will be important in deciding best surgery, which could be lumpectomy if no genetic abnormality identified, or mastectomy/ bilateral mastectomies if BRCA abnormality present. She understands that blood work for genetics will be obtained after she talks with genetics counselor tomorrow, and will take 3 weeks to result. Even prior to liver chemistries resulting as above, I had recommended staging CTs CAP at least as baseline due to prior history, and also indicated now with the LFT abnormality. We have discussed use of systemic chemotherapy with triple negative breast cancers >0.5 cm even if nodes negative. I did not have total adriamycin information by time of this visit, but have told her that drugs at least similar to her previous AC followed by taxol weekly x 8 may be appropriate; she understands that I will get her previous chemotherapy information and discuss chemo recommendations in detail with her then. We have discussed PAC, which will be needed due to bilateral axillary node evaluations and if vesicant chemotherapy is used; she does seem willing to have PAC per our discussion now. Patient is understandably upset as she thinks about more chemotherapy and hair loss, but is willing to do whatever will be best. I have offered to meet with her and husband next week when he is in town, as he may well have questions that would be easiest discussed directly. She agrees to scheduling another visit with me for this next week, but can cancel if husband does not feel that he needs to meet together.  Patient wonders if she should cancel plans for trip to [place] in July. As this will likely be during or just after completion of chemotherapy, I believe that she will cancel those plans.  I have completed FMLA papers for her  husband now. He may be able to come for several weeks around surgery/ chemo.  Note patient is very comfortable with this sign language interpreter, seemed to understand all of discussion and had questions answered to her satisfaction.  Assessment/Plan: 1.New diagnosis of triple negative left breast cancer: will get body CTs due  to prior treatment and elevated LFTs. Anticipate genetics testing after visit to [facility] tomorrow. Surgery choice will depend on findings from above. Chemotherapy will be needed with triple negative disease. Dr Tammi Klippel involved if radiation appropriate. I will see her back with husband next week if she/ husband would like, then further appointments depending on scans and surgery. 2.history of T2N0 ER PR negative HER 2 + right breast cancer in 2002 at age 37 and premenopausal: treatment as above, which did not include herceptin adjuvantly in 2002. No findings of concern on right by mammogram or MRI now 3.She will need PAC for chemotherapy 4.negative colonoscopy ~ Jan 2015 5.post menopausal since ~ age 87 6.pain left elbow may be tendonitis. Suggested Aleve or ibuprofen for several days, each dose with food.    Time spent 60+ min, >50% in counseling and coordination of care   Akaiya Touchette P, MD   01/22/2014, 10:00 PM

## 2014-01-22 NOTE — Patient Instructions (Signed)
Call if needed prior to next appointment   [phone]

## 2014-01-23 ENCOUNTER — Encounter: Payer: Self-pay | Admitting: Genetic Counselor

## 2014-01-23 ENCOUNTER — Ambulatory Visit (HOSPITAL_BASED_OUTPATIENT_CLINIC_OR_DEPARTMENT_OTHER): Payer: Medicare Other | Admitting: Genetic Counselor

## 2014-01-23 ENCOUNTER — Other Ambulatory Visit: Payer: BC Managed Care – PPO

## 2014-01-23 DIAGNOSIS — C50219 Malignant neoplasm of upper-inner quadrant of unspecified female breast: Secondary | ICD-10-CM | POA: Diagnosis not present

## 2014-01-23 DIAGNOSIS — IMO0002 Reserved for concepts with insufficient information to code with codable children: Secondary | ICD-10-CM

## 2014-01-23 DIAGNOSIS — H9190 Unspecified hearing loss, unspecified ear: Secondary | ICD-10-CM

## 2014-01-23 DIAGNOSIS — Z853 Personal history of malignant neoplasm of breast: Secondary | ICD-10-CM

## 2014-01-23 DIAGNOSIS — Z822 Family history of deafness and hearing loss: Secondary | ICD-10-CM

## 2014-01-23 DIAGNOSIS — C50212 Malignant neoplasm of upper-inner quadrant of left female breast: Secondary | ICD-10-CM

## 2014-01-23 DIAGNOSIS — H905 Unspecified sensorineural hearing loss: Secondary | ICD-10-CM

## 2014-01-23 DIAGNOSIS — Z8042 Family history of malignant neoplasm of prostate: Secondary | ICD-10-CM

## 2014-01-23 LAB — CANCER ANTIGEN 27.29: CA 27.29: 27 U/mL (ref 0–39)

## 2014-01-23 LAB — CEA: CEA: 1 ng/mL (ref 0.0–5.0)

## 2014-01-23 NOTE — Progress Notes (Signed)
Dr.  Evlyn Clines requested a consultation for genetic counseling and risk assessment for Regina Watts, a 53 y.o. female, for discussion of her personal history of bilateral breast cancer.  She presents to clinic today, with her interpreter Juliann Pulse, to discuss the possibility of a genetic predisposition to cancer, and to further clarify her risks, as well as her family members' risks for cancer.   HISTORY OF PRESENT ILLNESS: In 2002, at the age of 50, Regina Watts was diagnosed with invasive ductal carcinoma of the right breast. This was treated with lumpectomy.  The tumor was ER-/PR-/Her2+.  In 2015, at the age of 57, Regina Watts was diagnosed with IDC of the left breast.  The tumor is triple negative.  She never had genetic testing at her original cancer.  Regina Watts also has congenital deafness, which appears to be non-syndromic.  She has a significant family history of deafness on both sides of her family.   Past Medical History  Diagnosis Date  . Congenital deafness   . Hyperlipidemia   . Breast cancer JANUARY 2002    RIGHT BREAST T4 SENTINEL NODE NEGATIVE, HER2 3+, ER/PR NEGATIVE  . Breast cancer February 2015    Left Breast T1N0  . Hx of radiation therapy   . Hx antineoplastic chemo     Past Surgical History  Procedure Laterality Date  . Breast surgery      lumpectomy on right side  . Breast biopsy    . Breast biopsy      History   Social History  . Marital Status: Married    Spouse Name: N/A    Number of Children: 1  . Years of Education: N/A   Social History Main Topics  . Smoking status: Former Smoker -- 3 years    Quit date: 01/11/1994  . Smokeless tobacco: Never Used  . Alcohol Use: Yes     Comment: very very seldom  . Drug Use: No  . Sexual Activity: None   Other Topics Concern  . None   Social History Narrative  . None    REPRODUCTIVE HISTORY AND PERSONAL RISK ASSESSMENT FACTORS: Menarche was at age 63.   postmenopausal Uterus Intact: yes Ovaries Intact:  yes G1P1A0, first live birth at age 70  She has not previously undergone treatment for infertility.   Oral Contraceptive use: 0 years   She has not used HRT in the past.    FAMILY HISTORY:  We obtained a detailed, 4-generation family history.  Significant diagnoses are listed below: Family History  Problem Relation Age of Onset  . Heart disease Mother   . Hypertension Mother   . Hyperlipidemia Mother   . Deafness Mother   . Liver cancer Maternal Aunt   . Liver cancer Maternal Grandfather   . Deafness Father   . Prostate cancer Father 31  . Deafness Sister   . Deafness Paternal Uncle   . Breast cancer Cousin     maternal cousin dx over 56  . Deafness Maternal Aunt   . Leukemia Cousin 2  Regina Watts's mother has 16 brothers and sisters, one sister is deaf.  Her maternal grandfather's mother was also deaf.  She has two sisters and one brother, and one sister is deaf.  Both of Regina Watts's parents are deaf, as is her paternal uncle.  She denies any other physical problem associated with deafness (e.g. Retinitis pigmentosa).  Patient's ancestors are of Caucasian descent. There is no reported Ashkenazi Jewish ancestry. There is no known consanguinity.  GENETIC COUNSELING ASSESSMENT: [name] is a 53 y.o. female with a personal history of bilateral breast cancer, one of which is triple negative, which somewhat suggestive of a hereditary cancer syndrome and predisposition to cancer. We, therefore, discussed and recommended the following at today's visit.   DISCUSSION: We reviewed the characteristics, features and inheritance patterns of hereditary cancer syndromes. We also discussed genetic testing, including the appropriate family members to test, the process of testing, insurance coverage and turn-around-time for results. We reviewed hereditary cancer syndromes, and that her mother's side of the family is large and is not suggestive of a hereditary cancer syndrome.  However, her father had prostate  cancer, he had one brother who had one child who died at age 42.  Therefore she has a limited paternal family history.  PLAN: After considering the risks, benefits, and limitations, Regina Watts provided informed consent to pursue genetic testing and the blood sample will be sent to [facility] for analysis of the Breast/ovarian cancer panel. We discussed the implications of a positive, negative and/ or variant of uncertain significance genetic test result. Results should be available within approximately 3 weeks' time, at which point they will be disclosed by telephone to Regina Watts, as will any additional recommendations warranted by these results. [facility]FRANEK will receive a summary of her genetic counseling visit and a copy of her results once available. This information will also be available in Epic. We encouraged Regina Watts to remain in contact with cancer genetics annually so that we can continuously update the family history and inform her of any changes in cancer genetics and testing that may be of benefit for her family. Regina Watts's questions were answered to her satisfaction today. Our contact information was provided should additional questions or concerns arise.  The patient was seen for a total of 60 minutes, greater than 50% of which was spent face-to-face counseling.  This note will also be sent to the referring provider via the electronic medical record. The patient will be supplied with a summary of this genetic counseling discussion as well as educational information on the discussed hereditary cancer syndromes following the conclusion of their visit.   Patient was discussed with Dr. Marcy Panning.   _______________________________________________________________________ For Office Staff:  Number of people involved in session: 2 Was an Intern/ student involved with case: no

## 2014-01-24 DIAGNOSIS — Z853 Personal history of malignant neoplasm of breast: Secondary | ICD-10-CM | POA: Diagnosis not present

## 2014-01-24 DIAGNOSIS — Z8042 Family history of malignant neoplasm of prostate: Secondary | ICD-10-CM | POA: Diagnosis not present

## 2014-01-24 DIAGNOSIS — C50919 Malignant neoplasm of unspecified site of unspecified female breast: Secondary | ICD-10-CM | POA: Diagnosis not present

## 2014-01-24 DIAGNOSIS — C50219 Malignant neoplasm of upper-inner quadrant of unspecified female breast: Secondary | ICD-10-CM | POA: Diagnosis not present

## 2014-01-29 ENCOUNTER — Other Ambulatory Visit: Payer: Self-pay | Admitting: Oncology

## 2014-01-29 ENCOUNTER — Ambulatory Visit (HOSPITAL_COMMUNITY)
Admission: RE | Admit: 2014-01-29 | Discharge: 2014-01-29 | Disposition: A | Discharge status: Home / Self Care | Payer: BC Managed Care – PPO | Source: Ambulatory Visit | Attending: Oncology | Admitting: Oncology

## 2014-01-29 ENCOUNTER — Encounter (HOSPITAL_COMMUNITY): Payer: Self-pay

## 2014-01-29 DIAGNOSIS — K7689 Other specified diseases of liver: Secondary | ICD-10-CM | POA: Insufficient documentation

## 2014-01-29 DIAGNOSIS — C50919 Malignant neoplasm of unspecified site of unspecified female breast: Secondary | ICD-10-CM | POA: Insufficient documentation

## 2014-01-29 DIAGNOSIS — C50212 Malignant neoplasm of upper-inner quadrant of left female breast: Secondary | ICD-10-CM

## 2014-01-29 MED ORDER — IOHEXOL 300 MG/ML  SOLN
100.0000 mL | Freq: Once | INTRAMUSCULAR | Status: AC | PRN
  Administered 2014-01-29: 100 mL via INTRAVENOUS

## 2014-01-31 ENCOUNTER — Ambulatory Visit (HOSPITAL_BASED_OUTPATIENT_CLINIC_OR_DEPARTMENT_OTHER): Payer: Medicare Other | Admitting: Oncology

## 2014-01-31 ENCOUNTER — Telehealth: Payer: Self-pay | Admitting: Oncology

## 2014-01-31 ENCOUNTER — Encounter: Payer: Self-pay | Admitting: Oncology

## 2014-01-31 ENCOUNTER — Other Ambulatory Visit: Payer: BC Managed Care – PPO

## 2014-01-31 VITALS — BP 127/81 | HR 74 | Temp 98.6°F | Resp 20 | Ht 67.0 in | Wt 173.5 lb

## 2014-01-31 DIAGNOSIS — R945 Abnormal results of liver function studies: Secondary | ICD-10-CM

## 2014-01-31 DIAGNOSIS — C50219 Malignant neoplasm of upper-inner quadrant of unspecified female breast: Secondary | ICD-10-CM | POA: Diagnosis not present

## 2014-01-31 DIAGNOSIS — H918X9 Other specified hearing loss, unspecified ear: Secondary | ICD-10-CM

## 2014-01-31 DIAGNOSIS — C50212 Malignant neoplasm of upper-inner quadrant of left female breast: Secondary | ICD-10-CM

## 2014-01-31 DIAGNOSIS — R7989 Other specified abnormal findings of blood chemistry: Secondary | ICD-10-CM

## 2014-01-31 DIAGNOSIS — Z853 Personal history of malignant neoplasm of breast: Secondary | ICD-10-CM

## 2014-01-31 NOTE — Telephone Encounter (Signed)
S/w the interpreter and the pt is aware of her mri appt this Friday along with the instructions. Pt has the appt in her hand.

## 2014-01-31 NOTE — Patient Instructions (Signed)
We will set up the MRI of liver and will let you know the results.  Call if questions or other concerns before next MD visit

## 2014-01-31 NOTE — Progress Notes (Signed)
OFFICE PROGRESS NOTE   01/31/2014   Physicians: Alphonsa Overall, Tyler Pita, Karrar Husein, Arvella Nigh, Earle Gell  INTERVAL HISTORY:  Patient is seen, together with husband and sign language interpreter, to review all information to date concerning the newly diagnosed triple negative left breast cancer, particularly as husband is in [place] only this week from his residence and work in [place]. Husband is hearing impaired, but communicates verbally as well as by sign language. Since I saw her last week, she met with genetics counselor on 01-23-14 with lab work submitted then. Chemistries from 01-22-14 returned with elevations in AST to 40 and ALT to 65, otherwise normal (CMET had been normal when last done in this EMR system in 07-2011). CEA and CA 27.29 were normal at 1.0 and 27 respectively. CT CAP 01-29-14 had minimal fibrosis anterior right lung thought from RT, minimal scarring LLL, stable 5 mm nodule left unchanged from 2008, diffuse decreased attenuation in liver consistent with steatosis, 9 mm "blush of hyperattenuation" peripheral left hepatic lobe, no other findings of concern abdomen or pelvis. I have reviewed this new information in addition to review of all of situation to date with [place] and husband now, with assistance of the interpreter.   FMLA papers completed for husband.  Daughter's 9th birthday was 01-29-14, thus timing of husband's visit this week; daughter wanted to go to [employer] twice for the birthday.   ONCOLOGIC HISTORY History is of right breast cancer diagnosed January 2002 when patient was age 53 and premenopausal, T2N0M0 (4cm grade 3 invasive ductal carcinoma, <25% high grade DCIS, S phase 8.6%), ER negative, PR negative, HER 2 positive. She had lumpectomy with 2 negative sentinel nodes, adjuvant chemotherapy by Dr Starr Sinclair with adriamycin cytoxan x 4 cycles from March 2002 thru May 2002 (total adriamycin dose 409 mg with weight 142, height 5'7"), and local  radiation (50.4 Gy with boost to 60.4) Gy Note she declined PAC and received the chemotherapy in 2002 by peripheral vein. I followed her beginning Sept 2003, after Dr Pearlie Oyster left [place], until last previous visit 07-2011. She declined TEACH trial with lapatinib in 2007 and 2008. Last breast MRI previously was 08-2011.  Patient had infertility treatments at [facility] in 2005, daughter born 02-01-2005. Menses had been very infrequent after chemo until the infertility treatment, then stopped after that pregnancy.  Patient had bilateral mammograms at [facility] office early 12-2013, with area questioned on left as compared with last prior mammograms of 08-2012. She had diagnostic left mammogram and [place] at [facility] on 01-01-14 which showed a mass in left upper inner breast posteriorly 1.5 x 1.4 x 1.3 cm on [place], with no abnormal left axillary nodes identified. She had biopsy on 01-01-14 with pathology ([id number]) showing grade 1-2 invasive ductal carcinoma ER, PR and HER 2 negative with Ki 66%. She had breast MRI 01-15-2014 with solitary enhancing mass upper inner left breast 1.7 x 2.0 x 1.8 cm, right breast not remarkable, no abnormal appearing lymph nodes and no mention of upper abdomen. Dr Lucia Gaskins prefers to wait on genetic testing information before deciding on type of surgery. She had consultation with Dr Tammi Klippel on 01-15-2014   Review of systems as above, also: No new or different complaints since I saw her a week ago. Remainder of 10 point Review of Systems negative.  Objective:  Vital signs in last 24 hours:  BP 127/81  Pulse 74  Temp(Src) 98.6 F (37 C) (Oral)  Resp 20  Ht '5\' 7"'  (1.702  m)  Wt 173 lb 8 oz (78.699 kg)  BMI 27.17 kg/m2  Alert, oriented and appropriate. Ambulatory without difficulty. Respirations not labored. Looks comfortable  Exam not repeated today, as all of visit was counseling, discussion and coordination of care.   Lab Results:  Results for orders placed in visit on  01/22/14  CBC WITH DIFFERENTIAL      Result Value Ref Range   WBC 3.9  3.9 - 10.3 10e3/uL   NEUT# 2.4  1.5 - 6.5 10e3/uL   HGB 13.9  11.6 - 15.9 g/dL   HCT 42.0  34.8 - 46.6 %   Platelets 141 (*) 145 - 400 10e3/uL   MCV 93.7  79.5 - 101.0 fL   MCH 31.1  25.1 - 34.0 pg   MCHC 33.2  31.5 - 36.0 g/dL   RBC 4.48  3.70 - 5.45 10e6/uL   RDW 12.9  11.2 - 14.5 %   lymph# 1.1  0.9 - 3.3 10e3/uL   MONO# 0.3  0.1 - 0.9 10e3/uL   Eosinophils Absolute 0.1  0.0 - 0.5 10e3/uL   Basophils Absolute 0.0  0.0 - 0.1 10e3/uL   NEUT% 60.9  38.4 - 76.8 %   LYMPH% 27.1  14.0 - 49.7 %   MONO% 7.9  0.0 - 14.0 %   EOS% 3.7  0.0 - 7.0 %   BASO% 0.4  0.0 - 2.0 %  COMPREHENSIVE METABOLIC PANEL (OY77)      Result Value Ref Range   Sodium 141  136 - 145 mEq/L   Potassium 3.9  3.5 - 5.1 mEq/L   Chloride 106  98 - 109 mEq/L   CO2 25  22 - 29 mEq/L   Glucose 136  70 - 140 mg/dl   BUN 13.3  7.0 - 26.0 mg/dL   Creatinine 0.8  0.6 - 1.1 mg/dL   Total Bilirubin 0.52  0.20 - 1.20 mg/dL   Alkaline Phosphatase 99  40 - 150 U/L   AST 40 (*) 5 - 34 U/L   ALT 65 (*) 0 - 55 U/L   Total Protein 7.1  6.4 - 8.3 g/dL   Albumin 4.1  3.5 - 5.0 g/dL   Calcium 9.3  8.4 - 10.4 mg/dL   Anion Gap 9  3 - 11 mEq/L  CEA      Result Value Ref Range   CEA 1.0  0.0 - 5.0 ng/mL  CANCER ANTIGEN 27.29      Result Value Ref Range   CA 27.29 27  0 - 39 U/mL     Studies/Results:  CT CHEST, ABDOMEN, AND PELVIS WITH CONTRAST   01-29-14  COMPARISON: CT CHEST W/O CM dated 07/21/2007  FINDINGS:  CT CHEST FINDINGS  No pathologically enlarged mediastinal, hilar or axillary lymph  nodes. Heart is at the upper limits of normal in size. No  pericardial effusion. No internal mammary adenopathy. Surgical clips  in the right breast.  Minimal biapical pleural parenchymal scarring. Mild subpleural  radiation fibrosis in the anterior right lung. Triangular-shaped 5  mm nodule along the lateral margin of the left major fissure (image  26) is  unchanged from 07/21/2007 and is most consistent with a  subpleural lymph node. Minimal scarring in the left lower lobe. Mild  mosaic attenuation in the lungs, nonspecific and possibly due to  expiratory phase imaging. No pleural fluid. Airway is unremarkable.  CT ABDOMEN AND PELVIS FINDINGS  Liver is decreased in attenuation diffusely. A 9 mm blush of hyper  attenuation in the periphery of the left hepatic lobe (series 2,  image 29) is nonspecific. Liver, gallbladder, adrenal glands,  spleen, pancreas, stomach and bowel, including appendix, are  otherwise unremarkable. Uterus and ovaries are visualized. No  pathologically enlarged lymph nodes. No free fluid. Scattered  atherosclerotic calcification of the arterial vasculature without  abdominal aortic aneurysm. No worrisome lytic or sclerotic lesions.  IMPRESSION:  1. Small blush of hyperattenuation in the peripheral left hepatic  lobe, typical of a perfusion anomaly. However, definitive  characterization is difficult without precontrast and delayed  postcontrast imaging. Therefore, metastatic disease cannot be  definitively excluded. Additional evaluation with MR abdomen without  and with contrast could be performed in further evaluation, as  clinically indicated.  2. Hepatic steatosis.  Medications: I have reviewed the patient's current medications.  DISCUSSION: all of information from imaging and pathology reviewed and discussed now. We have discussed specifics of triple negative breast cancers, rationale for genetics testing, need to consider bilateral mastectomies if genetic condition identified / fact that Lindora may want to consider bilateral mastectomies even if no genetic abnormalities can be identified. Discussed need for chemotherapy for triple negative tumors >0.5 cm. Discussed labs and CT as above, have recommended MRI liver as radiologist requests due to the questioned area left hepatic lobe. Discussed apparent hepatic steatosis  from weight gain, fact that this may account for the LFT abnormalities if MRI not otherwise of concern. I discussed in general chemotherapy considerations given prior adriamycin, including addition of taxane to other chemotherapy for triple negative. We have discussed reconstruction after mastectomy, with tissue expander or TRAM. She understands that reconstruction may be best done later so as not to delay chemotherapy. She does not want referral to plastic surgeon now. Husband aware that [place] is upset by anticipated hair loss. Husband hopes to get extended FMLA time to be in [place] thru much of treatment.   Assessment/Plan: 1.New diagnosis triple negative left breast cancer: work up thus far as above, now to have MRI liver and awaiting genetics information. Expect surgery promptly after genetics testing available, in order to do mastectomies if best based on that. 2.history of T2N0 ER PR negative HER 2 + right breast cancer in 2002 at age 79 and premenopausal: treatment as above, which did not include herceptin adjuvantly in 2002. No findings of concern on right by mammogram or MRI now  3.She will need PAC for chemotherapy  4.negative colonoscopy ~ Jan 2015  5.post menopausal since ~ age 32 6.congenital deafness. Needs sign language interpreter with all visits.   All questions answered to their satisfaction and all information carefully discussed such that patient and husband both appear to have good understanding of situation now. Time spent 35 min, all of which was in counseling and coordination of care.    Peyten Weare P, MD   01/31/2014, 9:17 PM

## 2014-02-02 ENCOUNTER — Telehealth: Payer: Self-pay

## 2014-02-02 ENCOUNTER — Other Ambulatory Visit: Payer: Self-pay | Admitting: Oncology

## 2014-02-02 ENCOUNTER — Ambulatory Visit (HOSPITAL_COMMUNITY)
Admission: RE | Admit: 2014-02-02 | Discharge: 2014-02-02 | Disposition: A | Discharge status: Home / Self Care | Payer: BC Managed Care – PPO | Source: Ambulatory Visit | Attending: Oncology | Admitting: Oncology

## 2014-02-02 DIAGNOSIS — C50212 Malignant neoplasm of upper-inner quadrant of left female breast: Secondary | ICD-10-CM

## 2014-02-02 DIAGNOSIS — R7989 Other specified abnormal findings of blood chemistry: Secondary | ICD-10-CM

## 2014-02-02 DIAGNOSIS — C50919 Malignant neoplasm of unspecified site of unspecified female breast: Secondary | ICD-10-CM | POA: Insufficient documentation

## 2014-02-02 DIAGNOSIS — R945 Abnormal results of liver function studies: Secondary | ICD-10-CM | POA: Insufficient documentation

## 2014-02-02 DIAGNOSIS — K7689 Other specified diseases of liver: Secondary | ICD-10-CM | POA: Insufficient documentation

## 2014-02-02 MED ORDER — GADOBENATE DIMEGLUMINE 529 MG/ML IV SOLN
16.0000 mL | Freq: Once | INTRAVENOUS | Status: AC | PRN
Start: 2014-02-02 — End: 2014-02-02
  Administered 2014-02-02: 16 mL via INTRAVENOUS

## 2014-02-02 NOTE — Telephone Encounter (Signed)
Regina Watts came in today with wife and stated that the insurance company needed more detailed information regarding treatment cycles days etc. On His FMLA papers. Called and spoke with Lovey Newcomer and Explained that definative treatment cannot be determined until the surgical path and prognostic indicators are obtained.   There are different types of chemotherapy treatment options.  Reviewed various  Scenarios.  Lovey Newcomer will approve the max of days of in a month which is 20 days to be used as needed eg:1 day a week or 5 days a week.  She will call Regina Watts  to discuss process. [name]  Will call Lovey Newcomer as he does not have a phone as his parents have one as both he and patient are hearing impaired.

## 2014-02-05 ENCOUNTER — Telehealth: Payer: Self-pay | Admitting: *Deleted

## 2014-02-05 NOTE — Telephone Encounter (Signed)
Left voice mail through interpreter with results below. Told her to call for any questions

## 2014-02-05 NOTE — Telephone Encounter (Signed)
Message copied by Patton Salles on Mon Feb 05, 2014  4:09 PM ------      Message from: Evlyn Clines P      Created: Mon Feb 05, 2014  1:08 PM       Labs seen and need follow up: please let her know that the MRI of liver just shows fatty liver from some weight gain, but nothing that we can tell is cancer, so that is good news.      (Note patient is deaf, but she is able to get the phone messages however we have done that.)  thanks ------

## 2014-02-08 DIAGNOSIS — R7309 Other abnormal glucose: Secondary | ICD-10-CM | POA: Diagnosis not present

## 2014-02-08 DIAGNOSIS — C50919 Malignant neoplasm of unspecified site of unspecified female breast: Secondary | ICD-10-CM | POA: Diagnosis not present

## 2014-02-08 DIAGNOSIS — K219 Gastro-esophageal reflux disease without esophagitis: Secondary | ICD-10-CM | POA: Diagnosis not present

## 2014-02-08 DIAGNOSIS — E782 Mixed hyperlipidemia: Secondary | ICD-10-CM | POA: Diagnosis not present

## 2014-02-08 DIAGNOSIS — F411 Generalized anxiety disorder: Secondary | ICD-10-CM | POA: Diagnosis not present

## 2014-02-18 ENCOUNTER — Encounter: Payer: Self-pay | Admitting: Genetic Counselor

## 2014-02-18 NOTE — Progress Notes (Signed)
HPI:  [name] was previously seen in the [place] clinic due to a personal and family history of cancer and concerns regarding a hereditary predisposition to cancer. Please refer to our prior cancer genetics clinic note for more information regarding [name] medical, social and family histories, and our assessment and recommendations, at the time. [name] recent genetic test results were disclosed to her, as were recommendations warranted by these results. These results and recommendations are discussed in more detail below.  GENETIC TEST RESULTS: At the time of [name]'s visit, we recommended she pursue genetic testing of the Breast/Ovarian Cancer Gene Panel. This test, which included sequencing and deletion/duplication analysis of the genes listed on the test report, was performed at [facility]. Genetic testing was normal, and did not reveal a pathogenic mutation in these genes. A complete list of all genes tested is located on the test report scanned into EPIC. Genetic testing did identify three variants of uncertain significance called, ATM, [id number]; ATM, c.8674G>A and PMS2,  C.1309C>T. At this time, it is unclear if these variants are associated with an increased risk for cancer or a normal finding. With time, we suspect the lab will re-classify these and we will re-contact [name] at that time to discuss the significance, if any.   We discussed with [name] that since the current genetic testing is not perfect, it is possible there may be a gene mutation in one of these genes that current testing cannot detect, but that chance is small.  We also discussed, that it is possible that another gene that has not yet been discovered, or that we have not yet tested, is responsible for the cancer diagnoses in the family, and it is, therefore, important to remain in touch with cancer genetics in the future so that we can continue to offer [name] the most  up to date genetic testing.   CANCER SCREENING RECOMMENDATIONS:  This result is reassuring and suggests that [name] cancer was most likely not due to an inherited predisposition associated with one of these genes.  Most cancers happen by chance and this negative test, along with details of her family history, suggests that her cancer falls into this category.  We, therefore, recommended she continue to follow the cancer management and screening guidelines provided by her primary and oncology providers.   RECOMMENDATIONS FOR FAMILY MEMBERS:  Women in this family might be at some increased risk of developing cancer, over the general population risk, simply due to the family history of cancer.  We recommended women in this family have a yearly mammogram beginning at age 27, an an annual clinical breast exam, and perform monthly breast self-exams. Women in this family should also have a gynecological exam as recommended by their primary provider. All family members should have a colonoscopy by age 92.  FOLLOW-UP: Lastly, we discussed with [name] that cancer genetics is a rapidly advancing field and it is possible that new genetic tests will be appropriate for her and/or her family members in the future. We encouraged her to remain in contact with cancer genetics on an annual basis so we can update her personal and family histories and let her know of advances in cancer genetics that may benefit this family.   Our contact number was provided. [name] questions were answered to her satisfaction, and she knows she is welcome to call us at anytime with additional questions or concerns. This patient was discussed with Dr.  Marko Plume who agrees with the above.   Catherine A. Fine, MS, CGC Certified Genetic Counseor phone: [phone] [email or web address]

## 2014-02-19 ENCOUNTER — Telehealth: Payer: Self-pay | Admitting: *Deleted

## 2014-02-19 NOTE — Telephone Encounter (Signed)
Pt left a voice mail stating she has received her genetic testing results in the mail. She wants to know what is next? Is "ready to get this over with" Note to Dr Marko Plume. Called patient back and left her a message that we received her message and we will follow up as soon as Dr Marko Plume has reviewed her results.

## 2014-02-20 ENCOUNTER — Other Ambulatory Visit: Payer: Self-pay | Admitting: *Deleted

## 2014-02-20 NOTE — Progress Notes (Signed)
Noted scheduled follow up with Dr. Fenton Malling. Newman on 03-01-2014.  Routed Dr. Mariana Kaufman instructions to Dr. Lucia Gaskins.  Report of BRCA not yet received and surgery schedule pending.  No P.O.F initiated for [facility] f/u due to no surgery scheduled at this time.      02-19-2014 patient calling to see when surgery will be due to genetics test results back.  No appointments in EMR from Dr Alphonsa Overall.   RN let Dr Lucia Gaskins his RN know BRCA testing negative (in EMR note from [facility]). Please tell them patient is anxious to proceed with surgery so they can get that scheduled.   Let patient know Dr Pollie Friar office should be in touch with [place], but that she should call us back if she does not hear from them by 4-15 or 4-16.   Set up return apt x 60 min with me 2-3 weeks post surgery + lab   NOTE PATIENT IS DEAF

## 2014-02-21 NOTE — Addendum Note (Signed)
Addended by: Shann Medal on: 02/21/2014 02:25 PM   Modules accepted: Orders

## 2014-03-01 ENCOUNTER — Encounter (INDEPENDENT_AMBULATORY_CARE_PROVIDER_SITE_OTHER): Payer: Self-pay | Admitting: Surgery

## 2014-03-01 ENCOUNTER — Ambulatory Visit (INDEPENDENT_AMBULATORY_CARE_PROVIDER_SITE_OTHER): Payer: BC Managed Care – PPO | Admitting: Surgery

## 2014-03-01 VITALS — BP 120/76 | HR 74 | Resp 14 | Ht 67.0 in | Wt 169.2 lb

## 2014-03-01 DIAGNOSIS — C50219 Malignant neoplasm of upper-inner quadrant of unspecified female breast: Secondary | ICD-10-CM

## 2014-03-01 DIAGNOSIS — C50212 Malignant neoplasm of upper-inner quadrant of left female breast: Secondary | ICD-10-CM

## 2014-03-01 NOTE — Progress Notes (Signed)
 Re:   Regina Watts DOB:   09/21/1961 MRN:   [id number]  ASSESSMENT AND PLAN: 1.  Left breast cancer (T1, N0), approximately 1.5 cm.  At 10 o'clock.  Biopsy (Accession: [id number]) - IDC, 0% - ER, 0% - PR, Ki67- 66%, Her2Neu - neg  She saw Dr. Livesay, saw Dr. Manning, and obtained genetics   MRI showed solitary left breast mass measuring 1.9 cm.  Genetic testing negative from Catherine Fine on 02/20/2014.    I discussed the surgical options of lumpectomy vs. mastectomy.   I think that she is a lumpectomy candidate, though she is obviously concerned about this being her second cancer.   I discussed the indications and potential complications of the power port placement.  The primary complications of the power port, include, but are not limited to, bleeding, infection, nerve injury, thrombosis, and pneumothorax.  I discussed and reviewed left breast lumpectomy, left axillary SLNBx, and power port placement.   2.  History of right breast cancer  Right breast lumpectomy and right axillary sentinel lymph node biopsy - 12/08/2000 - D.   Invasive ductal carcinoma -T2, N0 - 4 cm cancer, 0/2 nodes, ER/PR - neg, Her2neu - positive  She had chemotx (adriamycin and cytoxan) by Dr. L. Shinn, then was followed by Dr. L. Livesay.  Had RTx by Dr. M. Manning.  She has been disease free on the right side and has been pleased with the results.  3.  Congenital deafness  Cathy Ferris provided sign language  No chief complaint on file.  REFERRING PHYSICIAN: HUSAIN,KARRAR, MD  HISTORY OF PRESENT ILLNESS: Regina Watts is a 52 y.o. (DOB: 11/28/1960)  white  female whose primary care physician is HUSAIN,KARRAR, MD and comes to me today for left breast cancer. She is doing well.  I spent around 45 minutes reviewing the findings and discussing the plans.  She had good questions, particularly lumpectomy vs mastectomy and the need for a power port. I wrote my notes on the back of her MRI report. She thinks  that her husband, who lives in [place], will be down here next week.  I have an opening next Tuesday, 4/28, but will have to see OR availability.  Breast Cancer History: Regina Watts is married, but her husband lives in [place].  It seems that they get together maybe once a month.  I am not sure that I have ever met her husband.  Regina Watts is a former breast cancer patient of mine.  Her original diagnosis was 2002. She had a T2, N0 right breast cancer treated with lumpectomy and SLNBx, chemotx supervised by Dr. L. Shinn, and radiation therapy supervised by Dr. M. Manning.  Her right breast cancer was ER/PR neg, but Her2Neu positive.  She received adriamycin and cytoxan.  I am guessing this is just before Herceptin became generally available. I last saw Regina Watts in June 2011.  [facility]. She has a 1.5 cm lesion at 10 o'clock on mammogram and US.  She underwent a US guided biopsy the same day.   Past Medical History  Diagnosis Date  . Congenital deafness   . Hyperlipidemia   . Breast cancer JANUARY 2002    RIGHT BREAST T4 SENTINEL NODE NEGATIVE, HER2 3+, ER/PR NEGATIVE  . Breast cancer February 2015    Left Breast T1N0  . Hx of radiation therapy   . Hx antineoplastic chemo      Past Surgical History    Procedure Laterality Date  . Breast surgery      lumpectomy on right side  . Breast biopsy    . Breast biopsy        Current Outpatient Prescriptions  Medication Sig Dispense Refill  . rosuvastatin (CRESTOR) 10 MG tablet Take 10 mg by mouth daily.      . sertraline (ZOLOFT) 100 MG tablet Take 100 mg by mouth daily.       No current facility-administered medications for this visit.     No Known Allergies  REVIEW OF SYSTEMS: Skin:  No history of rash.  No history of abnormal moles. Infection:  No history of hepatitis or HIV.  No history of MRSA. Neurologic:  Congenital deafness. Cardiac:  No history of hypertension. No history of heart  disease.  No history of prior cardiac catheterization.  No history of seeing a cardiologist. Pulmonary:  Does not smoke cigarettes.  No asthma or bronchitis.  No OSA/CPAP.  Endocrine:  No diabetes. No thyroid disease. Gastrointestinal:  No history of stomach disease.  No history of liver disease.  No history of gall bladder disease.  No history of pancreas disease.  No history of colon disease. Urologic:  No history of kidney stones.  No history of bladder infections. Musculoskeletal:  No history of joint or back disease. Hematologic:  No bleeding disorder.  No history of anemia.  Not anticoagulated. Psycho-social:  The patient is oriented.   The patient has no obvious psychologic or social impairment to understanding our conversation and plan.  SOCIAL and FAMILY HISTORY: Married.  Though odd story that her husband lives in [place]. She has one daughter, [name], who is 10 (born 2006). She is a stay at home mom. Regina Watts is accompanied by Janetta Hora who provided sign language.  PHYSICAL EXAM: BP 120/76  Pulse 74  Resp 14  Ht 5' 7" (1.702 m)  Wt 169 lb 3.2 oz (76.749 kg)  BMI 26.49 kg/m2  General: WN WF who is alert and generally healthy appearing.  He communication is through the person signing. I did not examine her today (see last visit).  I spent time reviewing the findings and discussing the plans.  DATA REVIEWED: Epic notes.  Alphonsa Overall, MD,  [facility], [address]  [place], [place]    [place] Phone:  [phone] FAX:  [phone]

## 2014-03-02 ENCOUNTER — Other Ambulatory Visit (INDEPENDENT_AMBULATORY_CARE_PROVIDER_SITE_OTHER): Payer: Self-pay | Admitting: Surgery

## 2014-03-02 ENCOUNTER — Encounter (HOSPITAL_BASED_OUTPATIENT_CLINIC_OR_DEPARTMENT_OTHER): Payer: Self-pay | Admitting: *Deleted

## 2014-03-02 ENCOUNTER — Other Ambulatory Visit: Payer: Self-pay

## 2014-03-02 DIAGNOSIS — C50212 Malignant neoplasm of upper-inner quadrant of left female breast: Secondary | ICD-10-CM

## 2014-03-02 NOTE — Progress Notes (Signed)
Pt is deaf-will request a sign language person for her-she reads and writes well

## 2014-03-05 ENCOUNTER — Telehealth: Payer: Self-pay | Admitting: Oncology

## 2014-03-05 NOTE — Telephone Encounter (Signed)
lvm with sign lang vm person...they will give msg

## 2014-03-06 ENCOUNTER — Encounter (HOSPITAL_BASED_OUTPATIENT_CLINIC_OR_DEPARTMENT_OTHER): Payer: Self-pay | Admitting: Certified Registered"

## 2014-03-06 ENCOUNTER — Encounter (HOSPITAL_BASED_OUTPATIENT_CLINIC_OR_DEPARTMENT_OTHER)
Admission: RE | Disposition: A | Discharge status: Home / Self Care | Payer: Self-pay | Source: Ambulatory Visit | Attending: Surgery

## 2014-03-06 ENCOUNTER — Ambulatory Visit (HOSPITAL_COMMUNITY)
Admission: RE | Admit: 2014-03-06 | Discharge: 2014-03-06 | Disposition: A | Discharge status: Home / Self Care | Payer: BC Managed Care – PPO | Source: Ambulatory Visit | Attending: Surgery | Admitting: Surgery

## 2014-03-06 ENCOUNTER — Ambulatory Visit (HOSPITAL_BASED_OUTPATIENT_CLINIC_OR_DEPARTMENT_OTHER)
Admission: RE | Admit: 2014-03-06 | Discharge: 2014-03-06 | Disposition: A | Discharge status: Home / Self Care | Payer: BC Managed Care – PPO | Source: Ambulatory Visit | Attending: Surgery | Admitting: Surgery

## 2014-03-06 ENCOUNTER — Ambulatory Visit
Admission: RE | Admit: 2014-03-06 | Discharge: 2014-03-06 | Disposition: A | Discharge status: Home / Self Care | Payer: BC Managed Care – PPO | Source: Ambulatory Visit | Attending: Surgery | Admitting: Surgery

## 2014-03-06 ENCOUNTER — Ambulatory Visit (HOSPITAL_BASED_OUTPATIENT_CLINIC_OR_DEPARTMENT_OTHER): Payer: BC Managed Care – PPO | Admitting: Certified Registered"

## 2014-03-06 ENCOUNTER — Ambulatory Visit (HOSPITAL_COMMUNITY): Payer: BC Managed Care – PPO

## 2014-03-06 ENCOUNTER — Other Ambulatory Visit (INDEPENDENT_AMBULATORY_CARE_PROVIDER_SITE_OTHER): Payer: Self-pay | Admitting: Surgery

## 2014-03-06 ENCOUNTER — Encounter (HOSPITAL_BASED_OUTPATIENT_CLINIC_OR_DEPARTMENT_OTHER): Payer: BC Managed Care – PPO | Admitting: Certified Registered"

## 2014-03-06 DIAGNOSIS — C50919 Malignant neoplasm of unspecified site of unspecified female breast: Secondary | ICD-10-CM

## 2014-03-06 DIAGNOSIS — C50212 Malignant neoplasm of upper-inner quadrant of left female breast: Secondary | ICD-10-CM

## 2014-03-06 DIAGNOSIS — Z9221 Personal history of antineoplastic chemotherapy: Secondary | ICD-10-CM | POA: Insufficient documentation

## 2014-03-06 DIAGNOSIS — Z79899 Other long term (current) drug therapy: Secondary | ICD-10-CM | POA: Insufficient documentation

## 2014-03-06 DIAGNOSIS — Z853 Personal history of malignant neoplasm of breast: Secondary | ICD-10-CM | POA: Insufficient documentation

## 2014-03-06 DIAGNOSIS — E785 Hyperlipidemia, unspecified: Secondary | ICD-10-CM | POA: Insufficient documentation

## 2014-03-06 DIAGNOSIS — H9190 Unspecified hearing loss, unspecified ear: Secondary | ICD-10-CM | POA: Insufficient documentation

## 2014-03-06 DIAGNOSIS — Z171 Estrogen receptor negative status [ER-]: Secondary | ICD-10-CM | POA: Insufficient documentation

## 2014-03-06 DIAGNOSIS — Z923 Personal history of irradiation: Secondary | ICD-10-CM | POA: Insufficient documentation

## 2014-03-06 HISTORY — PX: BREAST LUMPECTOMY: SHX2

## 2014-03-06 HISTORY — PX: BREAST LUMPECTOMY WITH NEEDLE LOCALIZATION AND AXILLARY SENTINEL LYMPH NODE BX: SHX5760

## 2014-03-06 HISTORY — PX: PORTACATH PLACEMENT: SHX2246

## 2014-03-06 HISTORY — DX: Presence of spectacles and contact lenses: Z97.3

## 2014-03-06 LAB — POCT HEMOGLOBIN-HEMACUE: Hemoglobin: 14.1 g/dL (ref 12.0–15.0)

## 2014-03-06 SURGERY — BREAST LUMPECTOMY WITH NEEDLE LOCALIZATION AND AXILLARY SENTINEL LYMPH NODE BX
Anesthesia: General | Site: Chest | Laterality: Right

## 2014-03-06 MED ORDER — SODIUM CHLORIDE 0.9 % IJ SOLN
INTRAMUSCULAR | Status: AC
  Filled 2014-03-06: qty 10

## 2014-03-06 MED ORDER — HEPARIN (PORCINE) IN NACL 2-0.9 UNIT/ML-% IJ SOLN
INTRAMUSCULAR | Status: DC | PRN
  Administered 2014-03-06: 500 mL via INTRAVENOUS

## 2014-03-06 MED ORDER — BUPIVACAINE HCL (PF) 0.25 % IJ SOLN
INTRAMUSCULAR | Status: AC
  Filled 2014-03-06: qty 30

## 2014-03-06 MED ORDER — MIDAZOLAM HCL 2 MG/2ML IJ SOLN
INTRAMUSCULAR | Status: AC
  Filled 2014-03-06: qty 2

## 2014-03-06 MED ORDER — BUPIVACAINE HCL (PF) 0.25 % IJ SOLN
INTRAMUSCULAR | Status: DC | PRN
  Administered 2014-03-06: 30 mL

## 2014-03-06 MED ORDER — CHLORHEXIDINE GLUCONATE 4 % EX LIQD: 1.0000 "application " | Freq: Once | CUTANEOUS | Status: DC

## 2014-03-06 MED ORDER — FENTANYL CITRATE 0.05 MG/ML IJ SOLN
INTRAMUSCULAR | Status: AC
  Filled 2014-03-06: qty 6

## 2014-03-06 MED ORDER — CEFAZOLIN SODIUM-DEXTROSE 2-3 GM-% IV SOLR
INTRAVENOUS | Status: AC
  Filled 2014-03-06: qty 50

## 2014-03-06 MED ORDER — MIDAZOLAM HCL 2 MG/2ML IJ SOLN
1.0000 mg | INTRAMUSCULAR | Status: DC | PRN
  Administered 2014-03-06: 1 mg via INTRAVENOUS

## 2014-03-06 MED ORDER — ONDANSETRON HCL 4 MG/2ML IJ SOLN
INTRAMUSCULAR | Status: DC | PRN
  Administered 2014-03-06: 4 mg via INTRAVENOUS

## 2014-03-06 MED ORDER — CEFAZOLIN SODIUM-DEXTROSE 2-3 GM-% IV SOLR: 2.0000 g | INTRAVENOUS | Status: DC

## 2014-03-06 MED ORDER — HYDROCODONE-ACETAMINOPHEN 5-325 MG PO TABS: 1.0000 | ORAL_TABLET | Freq: Four times a day (QID) | ORAL | Status: DC | PRN

## 2014-03-06 MED ORDER — SUCCINYLCHOLINE CHLORIDE 20 MG/ML IJ SOLN
INTRAMUSCULAR | Status: AC
  Filled 2014-03-06: qty 1

## 2014-03-06 MED ORDER — EPHEDRINE SULFATE 50 MG/ML IJ SOLN
INTRAMUSCULAR | Status: DC | PRN
  Administered 2014-03-06: 10 mg via INTRAVENOUS
  Administered 2014-03-06: 5 mg via INTRAVENOUS

## 2014-03-06 MED ORDER — FENTANYL CITRATE 0.05 MG/ML IJ SOLN
50.0000 ug | INTRAMUSCULAR | Status: DC | PRN
  Administered 2014-03-06: 50 ug via INTRAVENOUS

## 2014-03-06 MED ORDER — LIDOCAINE HCL (PF) 1 % IJ SOLN
INTRAMUSCULAR | Status: AC
  Filled 2014-03-06: qty 30

## 2014-03-06 MED ORDER — LACTATED RINGERS IV SOLN
INTRAVENOUS | Status: DC
  Administered 2014-03-06 (×3): via INTRAVENOUS

## 2014-03-06 MED ORDER — PROPOFOL 10 MG/ML IV EMUL
INTRAVENOUS | Status: AC
  Filled 2014-03-06: qty 50

## 2014-03-06 MED ORDER — BUPIVACAINE HCL (PF) 0.25 % IJ SOLN
INTRAMUSCULAR | Status: DC | PRN
  Administered 2014-03-06: 4.5 mL

## 2014-03-06 MED ORDER — HEPARIN SOD (PORK) LOCK FLUSH 100 UNIT/ML IV SOLN
INTRAVENOUS | Status: AC
  Filled 2014-03-06: qty 5

## 2014-03-06 MED ORDER — CEFAZOLIN SODIUM-DEXTROSE 2-3 GM-% IV SOLR
2.0000 g | INTRAVENOUS | Status: AC
Start: 2014-03-06 — End: 2014-03-06
  Administered 2014-03-06: 2 g via INTRAVENOUS

## 2014-03-06 MED ORDER — FENTANYL CITRATE 0.05 MG/ML IJ SOLN
INTRAMUSCULAR | Status: AC
  Filled 2014-03-06: qty 2

## 2014-03-06 MED ORDER — HEPARIN (PORCINE) IN NACL 2-0.9 UNIT/ML-% IJ SOLN
INTRAMUSCULAR | Status: AC
  Filled 2014-03-06: qty 500

## 2014-03-06 MED ORDER — LIDOCAINE HCL (CARDIAC) 20 MG/ML IV SOLN
INTRAVENOUS | Status: DC | PRN
  Administered 2014-03-06: 80 mg via INTRAVENOUS

## 2014-03-06 MED ORDER — PROPOFOL 10 MG/ML IV BOLUS
INTRAVENOUS | Status: DC | PRN
  Administered 2014-03-06: 180 mg via INTRAVENOUS

## 2014-03-06 MED ORDER — POVIDONE-IODINE 10 % EX OINT
TOPICAL_OINTMENT | CUTANEOUS | Status: AC
  Filled 2014-03-06: qty 28.35

## 2014-03-06 MED ORDER — FENTANYL CITRATE 0.05 MG/ML IJ SOLN
INTRAMUSCULAR | Status: DC | PRN
  Administered 2014-03-06 (×3): 50 ug via INTRAVENOUS

## 2014-03-06 MED ORDER — SODIUM CHLORIDE 0.9 % IJ SOLN
INTRAMUSCULAR | Status: DC | PRN
  Administered 2014-03-06: 10:00:00 via INTRAMUSCULAR

## 2014-03-06 MED ORDER — HEPARIN SOD (PORK) LOCK FLUSH 100 UNIT/ML IV SOLN
INTRAVENOUS | Status: DC | PRN
  Administered 2014-03-06: 500 [IU]

## 2014-03-06 MED ORDER — METHYLENE BLUE 1 % INJ SOLN
INTRAMUSCULAR | Status: AC
  Filled 2014-03-06: qty 10

## 2014-03-06 MED ORDER — DEXAMETHASONE SODIUM PHOSPHATE 4 MG/ML IJ SOLN
INTRAMUSCULAR | Status: DC | PRN
  Administered 2014-03-06: 10 mg via INTRAVENOUS

## 2014-03-06 MED ORDER — TECHNETIUM TC 99M SULFUR COLLOID FILTERED
1.0000 | Freq: Once | INTRAVENOUS | Status: AC | PRN
  Administered 2014-03-06: 1 via INTRADERMAL

## 2014-03-06 SURGICAL SUPPLY — 89 items
ADH SKN CLS APL DERMABOND .7 (GAUZE/BANDAGES/DRESSINGS) ×4
APL SKNCLS STERI-STRIP NONHPOA (GAUZE/BANDAGES/DRESSINGS) ×2
APPLIER CLIP 11 MED OPEN (CLIP)
APR CLP MED 11 20 MLT OPN (CLIP)
BAG DECANTER FOR FLEXI CONT (MISCELLANEOUS) ×4 IMPLANT
BANDAGE ELASTIC 6 VELCRO ST LF (GAUZE/BANDAGES/DRESSINGS) IMPLANT
BENZOIN TINCTURE PRP APPL 2/3 (GAUZE/BANDAGES/DRESSINGS) ×4 IMPLANT
BINDER BREAST LRG (GAUZE/BANDAGES/DRESSINGS) ×2 IMPLANT
BINDER BREAST MEDIUM (GAUZE/BANDAGES/DRESSINGS) IMPLANT
BINDER BREAST XLRG (GAUZE/BANDAGES/DRESSINGS) IMPLANT
BINDER BREAST XXLRG (GAUZE/BANDAGES/DRESSINGS) IMPLANT
BLADE 10 SAFETY STRL DISP (BLADE) ×4 IMPLANT
BLADE 15 SAFETY STRL DISP (BLADE) ×4 IMPLANT
CANISTER SUCT 1200ML W/VALVE (MISCELLANEOUS) ×4 IMPLANT
CHLORAPREP W/TINT 26ML (MISCELLANEOUS) ×6 IMPLANT
CLEANER CAUTERY TIP 5X5 PAD (MISCELLANEOUS) ×2 IMPLANT
CLIP APPLIE 11 MED OPEN (CLIP) IMPLANT
CLIP TI WIDE RED SMALL 6 (CLIP) ×4 IMPLANT
CLOSURE WOUND 1/4X4 (GAUZE/BANDAGES/DRESSINGS) ×1
COVER MAYO STAND STRL (DRAPES) ×6 IMPLANT
COVER PROBE 5X48 (MISCELLANEOUS)
COVER PROBE W GEL 5X96 (DRAPES) ×4 IMPLANT
COVER TABLE BACK 60X90 (DRAPES) ×6 IMPLANT
DECANTER SPIKE VIAL GLASS SM (MISCELLANEOUS) IMPLANT
DERMABOND ADVANCED (GAUZE/BANDAGES/DRESSINGS) ×4
DERMABOND ADVANCED .7 DNX12 (GAUZE/BANDAGES/DRESSINGS) ×2 IMPLANT
DEVICE DUBIN W/COMP PLATE 8390 (MISCELLANEOUS) ×4 IMPLANT
DRAIN CHANNEL 19F RND (DRAIN) IMPLANT
DRAIN HEMOVAC 1/8 X 5 (WOUND CARE) IMPLANT
DRAPE C-ARM 42X72 X-RAY (DRAPES) ×4 IMPLANT
DRAPE LAPAROSCOPIC ABDOMINAL (DRAPES) ×6 IMPLANT
DRAPE LAPAROTOMY T 102X78X121 (DRAPES) ×6 IMPLANT
DRAPE UTILITY XL STRL (DRAPES) ×6 IMPLANT
ELECT COATED BLADE 2.86 ST (ELECTRODE) ×6 IMPLANT
ELECT REM PT RETURN 9FT ADLT (ELECTROSURGICAL) ×4
ELECTRODE REM PT RTRN 9FT ADLT (ELECTROSURGICAL) ×2 IMPLANT
EVACUATOR SILICONE 100CC (DRAIN) IMPLANT
GAUZE SPONGE 4X4 12PLY STRL (GAUZE/BANDAGES/DRESSINGS) IMPLANT
GAUZE SPONGE 4X4 16PLY XRAY LF (GAUZE/BANDAGES/DRESSINGS) IMPLANT
GLOVE BIOGEL PI IND STRL 6.5 (GLOVE) IMPLANT
GLOVE BIOGEL PI IND STRL 7.0 (GLOVE) IMPLANT
GLOVE BIOGEL PI INDICATOR 6.5 (GLOVE) ×2
GLOVE BIOGEL PI INDICATOR 7.0 (GLOVE) ×2
GLOVE ECLIPSE 6.5 STRL STRAW (GLOVE) ×8 IMPLANT
GLOVE EXAM NITRILE MD LF STRL (GLOVE) ×2 IMPLANT
GLOVE SURG SIGNA 7.5 PF LTX (GLOVE) ×6 IMPLANT
GOWN STRL REUS W/ TWL LRG LVL3 (GOWN DISPOSABLE) IMPLANT
GOWN STRL REUS W/ TWL XL LVL3 (GOWN DISPOSABLE) IMPLANT
GOWN STRL REUS W/TWL LRG LVL3 (GOWN DISPOSABLE) ×12
GOWN STRL REUS W/TWL XL LVL3 (GOWN DISPOSABLE) ×8
IV CATH AUTO 14GX1.75 SAFE ORG (IV SOLUTION) IMPLANT
IV CATH PLACEMENT UNIT 16 GA (IV SOLUTION) IMPLANT
IV KIT MINILOC 20X1 SAFETY (NEEDLE) IMPLANT
KIT CVR 48X5XPRB PLUP LF (MISCELLANEOUS) IMPLANT
KIT MARKER MARGIN INK (KITS) ×2 IMPLANT
KIT PORT POWER 8FR ISP CVUE (Catheter) ×2 IMPLANT
NDL HYPO 25X1 1.5 SAFETY (NEEDLE) ×4 IMPLANT
NDL SAFETY ECLIPSE 18X1.5 (NEEDLE) ×2 IMPLANT
NEEDLE HYPO 18GX1.5 SHARP (NEEDLE) ×4
NEEDLE HYPO 22GX1.5 SAFETY (NEEDLE) ×2 IMPLANT
NEEDLE HYPO 25X1 1.5 SAFETY (NEEDLE) ×12 IMPLANT
NS IRRIG 1000ML POUR BTL (IV SOLUTION) ×4 IMPLANT
PACK BASIN DAY SURGERY FS (CUSTOM PROCEDURE TRAY) ×4 IMPLANT
PAD ALCOHOL SWAB (MISCELLANEOUS) ×2 IMPLANT
PAD CLEANER CAUTERY TIP 5X5 (MISCELLANEOUS) ×2
PENCIL BUTTON HOLSTER BLD 10FT (ELECTRODE) ×6 IMPLANT
PIN SAFETY STERILE (MISCELLANEOUS) IMPLANT
SET SHEATH INTRODUCER 10FR (MISCELLANEOUS) IMPLANT
SHEATH COOK PEEL AWAY SET 9F (SHEATH) IMPLANT
SHEET MEDIUM DRAPE 40X70 STRL (DRAPES) ×4 IMPLANT
SLEEVE SCD COMPRESS KNEE MED (MISCELLANEOUS) ×4 IMPLANT
SPONGE GAUZE 4X4 12PLY STER LF (GAUZE/BANDAGES/DRESSINGS) ×8 IMPLANT
SPONGE LAP 18X18 X RAY DECT (DISPOSABLE) ×6 IMPLANT
STRIP CLOSURE SKIN 1/4X4 (GAUZE/BANDAGES/DRESSINGS) ×3 IMPLANT
SUT ETHILON 2 0 FS 18 (SUTURE) IMPLANT
SUT ETHILON 3 0 PS 1 (SUTURE) IMPLANT
SUT MNCRL AB 4-0 PS2 18 (SUTURE) ×2 IMPLANT
SUT MON AB 5-0 PS2 18 (SUTURE) ×4 IMPLANT
SUT SILK 3 0 TIES 17X18 (SUTURE)
SUT SILK 3-0 18XBRD TIE BLK (SUTURE) IMPLANT
SUT VIC AB 5-0 PS2 18 (SUTURE) ×4 IMPLANT
SUT VICRYL 3-0 CR8 SH (SUTURE) ×8 IMPLANT
SYR 5ML LUER SLIP (SYRINGE) ×4 IMPLANT
SYR CONTROL 10ML LL (SYRINGE) ×10 IMPLANT
TOWEL OR 17X24 6PK STRL BLUE (TOWEL DISPOSABLE) ×8 IMPLANT
TOWEL OR NON WOVEN STRL DISP B (DISPOSABLE) ×4 IMPLANT
TUBE CONNECTING 20'X1/4 (TUBING) ×1
TUBE CONNECTING 20X1/4 (TUBING) ×3 IMPLANT
YANKAUER SUCT BULB TIP NO VENT (SUCTIONS) ×4 IMPLANT

## 2014-03-06 NOTE — Op Note (Addendum)
03/06/2014  12:46 PM  PATIENT:  Regina Watts DOB: [birth year] MRN: [id number]  PREOP DIAGNOSIS:  left breast cancer  POSTOP DIAGNOSIS:   Left breast cancer  , 11 o'clock position (T1, N0)  PROCEDURE:   Procedure(s): Left BREAST LUMPECTOMY WITH NEEDLE LOCALIZATION AND Left AXILLARY SENTINEL LYMPH NODE BIOPSY, INSERTION PORT-A-CATH, Injection of peri areolar area of breast with methylene blue (1.0 cc)  SURGEON:   Alphonsa Overall, M.D.  ANESTHESIA:   general  Anesthesiologist: Albertha Ghee, MD CRNA: Baxter Flattery, CRNA; Tawni Millers, CRNA  General  EBL:  < 50  ml  DRAINS: none   LOCAL MEDICATIONS USED:   35 cc 1/4% Marcaine  SPECIMEN:   Left breast lumpectomy, posterior margin of lumpectomy (suture marks superior margin), left axillary sentinel lymph node  COUNTS CORRECT:  YES  INDICATIONS FOR PROCEDURE:  Regina Watts is a 53 y.o. (DOB: [birth year]) white  female whose primary care physician is HUSAIN,KARRAR, MD and comes for left breast lumpectomy and left axillary sentinel lymph node biopsy.  I will also place a power port for anticipated chemotherapy.  Dr. Evlyn Clines is the treating oncologist.   Options for breast cancer treatment were discussed with the patient. She elected to proceed with lumpectomy and axillary sentinel lymph node.     The indications and potential complications of surgery were explained to the patient. Potential complications include, but are not limited to, bleeding, infection, the need for further surgery, and nerve injury.  Since she is expecting chemotherapy, I will place a power port at the same time.    The patient had a needle loc wire placed at the 10 o'clock position of the left breast at [facility] by Dr. Radford Pax.  In the holding area, her left areola was injected with 1 millicurie of Technitium Sulfur Colloid.  OPERATIVE NOTE:   The patient was taken to room # 8 at [facility] where she underwent a general anesthesia  supervised by  Anesthesiologist: Albertha Ghee, MD CRNA: Baxter Flattery, CRNA; Tawni Millers, CRNA. Her left breast and axilla were prepped with  ChloraPrep and sterilely draped.    A time-out and the surgical check list was reviewed.    I injected about 1.0 mL of 40% methylene blue around her left areola.   I started with the left axillary sentinel lymph node biopsy. I made an incision in the left axilla.  I found a hot area at the junction of the breast and the pectoralis major muscle. I cut down and  identified a hot node that had counts of 140 and the background has 0 counts. The lymph node was blue. I checked her internal mammary nodes and supraclavicular nodes with the neoprobe and found no other hot area. The axillary node was then sent to pathology.    I turned attention to the cancer which was about at the 11 o'clock position of the left breast.   The wire came in medial to lateral about 6 cm from the tumor.  I cut down and tried to take an ellipse of breast tissue around the tumor by at least 1 cm.   I excised this block of breast tissue approximately 6 cm by 6 cm  in diameter. I did take the dissection down to the pectoralis major.  The tumor was close to the pectoralis major, but not in the muscle.   I took an additional deep margin.  I painted the lumpectomy specimen with the 6 color paint  kit (medial on the specimen is towards the sternum) and did a specimen mammogram which confirmed the mass, clip and the wire were all in the right position.  I painted the additional deep margin and placed a suture on the cranial end.  This additional margin was mainly fatty tissue.   I sent the specimen to pathology, who called back and said the tumor is at the deep margin.  I think I have addressed this and the tumor is not in the pectoralis major.   I then irrigated the wound with saline. I infiltrated approximately 30 mL of 1% local between the  Incisions.  I placed 8 clips to mark biopsy cavity, at 12, 3, 6, and 9  o'clock. Four clips were placed on the pectoralis major in a circle around where the tumor was close to the pectoralis major.  I expect that this area may need a boost in radiation therapy.   I then closed all the wounds in layers using 3-0 Vicryl sutures for the deep layer. At the skin, I closed the incisions with a 5-0 Monocryl suture.    The patient was then repositioned for the power port placement.   A time out was held and the surgery checklist reviewed.   The patient was placed in Trendelenburg position.  The right subclavian vein was accessed with a 16 gauge needle and a guide wire threaded through the needle into the vein.  The position of the wire was checked with fluoroscopy.   I then developed a pocket in the upper inner aspect of the right chest for the port reservoir.  I used the [employer] for venous access.  The reservoir was sewn in place with a 3-0 Vicryl suture.  The reservoir had been flushed with dilute (10 units/cc) heparin.   I then passed the silastic tubing from the reservoir incision to the subclavian stick site and used the 8 French introducer to pass it into the vein.  The tip of the silastic catheter was position at the junction of the SVC and the right atrium under fluoroscopy.  The silastic catheter was then attached to the port with the bayonet device.     The entire port and tubing were checked with fluoroscopy and then the port was flushed with 4 cc of concentrated heparin (100 units/cc).   The wounds were then closed with 3-0 vicryl subcutaneous sutures and the skin closed with a 5-0 Vicryl suture.  The skin was painted with tincture of benzoin and steri-stripped.   The breast and axillary incisions were then painted with Dermabond.  She had gauze place over the wounds and placed in a breast binder.   The patient tolerated the procedure well, was transported to the recovery room in good condition. Sponge and needle count were correct at the end of the  case.   Final pathology is pending.  CXR is pending.   Alphonsa Overall, MD, [facility] Pager: [phone] Office phone:  [phone]

## 2014-03-06 NOTE — H&P (View-Only) (Signed)
 Re:   Regina Watts DOB:   11/21/1960 MRN:   [id number]  ASSESSMENT AND PLAN: 1.  Left breast cancer (T1, N0), approximately 1.5 cm.  At 10 o'clock.  Biopsy (Accession: [id number]) - IDC, 0% - ER, 0% - PR, Ki67- 66%, Her2Neu - neg  She saw Dr. Livesay, saw Dr. Manning, and obtained genetics   MRI showed solitary left breast mass measuring 1.9 cm.  Genetic testing negative from [facility] on 02/20/2014.    I discussed the surgical options of lumpectomy vs. mastectomy.   I think that she is a lumpectomy candidate, though she is obviously concerned about this being her second cancer.   I discussed the indications and potential complications of the power port placement.  The primary complications of the power port, include, but are not limited to, bleeding, infection, nerve injury, thrombosis, and pneumothorax.  I discussed and reviewed left breast lumpectomy, left axillary SLNBx, and power port placement.   2.  History of right breast cancer  Right breast lumpectomy and right axillary sentinel lymph node biopsy - 12/08/2000 - D. Bernadette Gores  Invasive ductal carcinoma -T2, N0 - 4 cm cancer, 0/2 nodes, ER/PR - neg, Her2neu - positive  She had chemotx (adriamycin and cytoxan) by Dr. L. Shinn, then was followed by Dr. L. Livesay.  Had RTx by Dr. M. Manning.  She has been disease free on the right side and has been pleased with the results.  3.  Congenital deafness  Regina Watts provided sign language  No chief complaint on file.  REFERRING PHYSICIAN: HUSAIN,KARRAR, MD  HISTORY OF PRESENT ILLNESS: Regina Watts is a 53 y.o. (DOB: 06/09/1961)  white  female whose primary care physician is HUSAIN,KARRAR, MD and comes to me today for left breast cancer. She is doing well.  I spent around 45 minutes reviewing the findings and discussing the plans.  She had good questions, particularly lumpectomy vs mastectomy and the need for a power port. I wrote my notes on the back of her MRI report. She thinks  that her husband, who lives in [place], will be down here next week.  I have an opening next Tuesday, 4/28, but will have to see OR availability.  Breast Cancer History: Regina Watts is married, but her husband lives in [place].  It seems that they get together maybe once a month.  I am not sure that I have ever met her husband.  Regina Watts is a former breast cancer patient of mine.  Her original diagnosis was 2002. She had a T2, N0 right breast cancer treated with lumpectomy and SLNBx, chemotx supervised by Dr. L. Shinn, and radiation therapy supervised by Dr. M. Manning.  Her right breast cancer was ER/PR neg, but Her2Neu positive.  She received adriamycin and cytoxan.  I am guessing this is just before Herceptin became generally available. I last saw Skiler in June 2011.  [facility]. She has a 1.5 cm lesion at 10 o'clock on mammogram and US.  She underwent a US guided biopsy the same day.   Past Medical History  Diagnosis Date  . Congenital deafness   . Hyperlipidemia   . Breast cancer JANUARY 2002    RIGHT BREAST T4 SENTINEL NODE NEGATIVE, HER2 3+, ER/PR NEGATIVE  . Breast cancer February 2015    Left Breast T1N0  . Hx of radiation therapy   . Hx antineoplastic chemo      Past Surgical History    Procedure Laterality Date  . Breast surgery      lumpectomy on right side  . Breast biopsy    . Breast biopsy        Current Outpatient Prescriptions  Medication Sig Dispense Refill  . rosuvastatin (CRESTOR) 10 MG tablet Take 10 mg by mouth daily.      . sertraline (ZOLOFT) 100 MG tablet Take 100 mg by mouth daily.       No current facility-administered medications for this visit.     No Known Allergies  REVIEW OF SYSTEMS: Skin:  No history of rash.  No history of abnormal moles. Infection:  No history of hepatitis or HIV.  No history of MRSA. Neurologic:  Congenital deafness. Cardiac:  No history of hypertension. No history of heart  disease.  No history of prior cardiac catheterization.  No history of seeing a cardiologist. Pulmonary:  Does not smoke cigarettes.  No asthma or bronchitis.  No OSA/CPAP.  Endocrine:  No diabetes. No thyroid disease. Gastrointestinal:  No history of stomach disease.  No history of liver disease.  No history of gall bladder disease.  No history of pancreas disease.  No history of colon disease. Urologic:  No history of kidney stones.  No history of bladder infections. Musculoskeletal:  No history of joint or back disease. Hematologic:  No bleeding disorder.  No history of anemia.  Not anticoagulated. Psycho-social:  The patient is oriented.   The patient has no obvious psychologic or social impairment to understanding our conversation and plan.  SOCIAL and FAMILY HISTORY: Married.  Though odd story that her husband lives in [place]. She has one daughter, Regina Watts, who is 10 (born 2006). She is a stay at home mom. Alethia is accompanied by Janetta Hora who provided sign language.  PHYSICAL EXAM: BP 120/76  Pulse 74  Resp 14  Ht 5' 7" (1.702 m)  Wt 169 lb 3.2 oz (76.749 kg)  BMI 26.49 kg/m2  General: WN WF who is alert and generally healthy appearing.  He communication is through the person signing. I did not examine her today (see last visit).  I spent time reviewing the findings and discussing the plans.  DATA REVIEWED: Epic notes.  Alphonsa Overall, MD,  [facility], [address] [place]  [place], [place]    [place] Phone:  [phone] FAX:  [phone]

## 2014-03-06 NOTE — Progress Notes (Signed)
Present for nuc med inj. Side rails up, monitors on throughout procedure. See vital signs in flow sheet. Tolerated Procedure well.

## 2014-03-06 NOTE — Discharge Instructions (Signed)
[facility] - DISCHARGE INSTRUCTIONS TO PATIENT  Activity:  Lifting - Take is easy for 3 or 4 days, then no limit  Wound Care:   Leave bandages x 2 days, then may shower.  Diet:  Regular  Follow up appointment:  Call Dr. Pollie Friar office [facility]) at [phone] for an appointment in 2 to 3 weeks.  Medications and dosages:  Resume your home medications.  You have a prescription for:  Vicodin  Call Dr. Lucia Gaskins or his office  [phone]) if you have:  Temperature greater than 100.4,  Persistent nausea and vomiting,  Severe uncontrolled pain,  Redness, tenderness, or signs of infection (pain, swelling, redness, odor or green/yellow discharge around the site),  Difficulty breathing, headache or visual disturbances,  Any other questions or concerns you may have after discharge.  In an emergency, call 911 or go to an Emergency Department at a nearby hospital.    Post Anesthesia Home Care Instructions  Activity: Get plenty of rest for the remainder of the day. A responsible adult should stay with you for 24 hours following the procedure.  For the next 24 hours, DO NOT: -Drive a car -Paediatric nurse -Drink alcoholic beverages -Take any medication unless instructed by your physician -Make any legal decisions or sign important papers.  Meals: Start with liquid foods such as gelatin or soup. Progress to regular foods as tolerated. Avoid greasy, spicy, heavy foods. If nausea and/or vomiting occur, drink only clear liquids until the nausea and/or vomiting subsides. Call your physician if vomiting continues.  Special Instructions/Symptoms: Your throat may feel dry or sore from the anesthesia or the breathing tube placed in your throat during surgery. If this causes discomfort, gargle with warm salt water. The discomfort should disappear within 24 hours.

## 2014-03-06 NOTE — Anesthesia Procedure Notes (Signed)
Procedure Name: LMA Insertion Date/Time: 03/06/2014 10:21 AM Performed by: Baxter Flattery Pre-anesthesia Checklist: Patient identified, Emergency Drugs available, Suction available and Patient being monitored Patient Re-evaluated:Patient Re-evaluated prior to inductionOxygen Delivery Method: Circle System Utilized Preoxygenation: Pre-oxygenation with 100% oxygen Intubation Type: IV induction Ventilation: Mask ventilation without difficulty LMA: LMA inserted LMA Size: 4.0 Number of attempts: 1 Airway Equipment and Method: bite block Placement Confirmation: positive ETCO2 and breath sounds checked- equal and bilateral Tube secured with: Tape Dental Injury: Teeth and Oropharynx as per pre-operative assessment

## 2014-03-06 NOTE — Anesthesia Preprocedure Evaluation (Signed)
Anesthesia Evaluation  Patient identified by MRN, date of birth, ID band Patient awake    Reviewed: Allergy & Precautions, H&P , NPO status , Patient's Chart, lab work & pertinent test results  Airway Mallampati: II   Neck ROM: full    Dental   Pulmonary former smoker,          Cardiovascular negative cardio ROS      Neuro/Psych    GI/Hepatic   Endo/Other    Renal/GU      Musculoskeletal   Abdominal   Peds  Hematology   Anesthesia Other Findings   Reproductive/Obstetrics                             Anesthesia Physical Anesthesia Plan  ASA: II  Anesthesia Plan: General   Post-op Pain Management:    Induction: Intravenous  Airway Management Planned: LMA  Additional Equipment:   Intra-op Plan:   Post-operative Plan:   Informed Consent: I have reviewed the patients History and Physical, chart, labs and discussed the procedure including the risks, benefits and alternatives for the proposed anesthesia with the patient or authorized representative who has indicated his/her understanding and acceptance.     Plan Discussed with: CRNA, Anesthesiologist and Surgeon  Anesthesia Plan Comments:         Anesthesia Quick Evaluation  

## 2014-03-06 NOTE — Anesthesia Postprocedure Evaluation (Signed)
Anesthesia Post Note  Patient: Regina Watts  Procedure(s) Performed: Procedure(s) (LRB): BREAST LUMPECTOMY WITH NEEDLE LOCALIZATION AND AXILLARY SENTINEL LYMPH NODE BIOPSY (Left) INSERTION PORT-A-CATH (Right)  Anesthesia type: General  Patient location: PACU  Post pain: Pain level controlled and Adequate analgesia  Post assessment: Post-op Vital signs reviewed, Patient's Cardiovascular Status Stable, Respiratory Function Stable, Patent Airway and Pain level controlled  Last Vitals:  Filed Vitals:   03/06/14 1330  BP: 143/127  Pulse: 95  Temp:   Resp: 18    Post vital signs: Reviewed and stable  Level of consciousness: awake, alert  and oriented  Complications: No apparent anesthesia complications

## 2014-03-06 NOTE — Transfer of Care (Signed)
Immediate Anesthesia Transfer of Care Note  Patient: Regina Watts  Procedure(s) Performed: Procedure(s) with comments: BREAST LUMPECTOMY WITH NEEDLE LOCALIZATION AND AXILLARY SENTINEL LYMPH NODE BIOPSY (Left) INSERTION PORT-A-CATH (Right) - subclavian  Patient Location: PACU  Anesthesia Type:General  Level of Consciousness: awake, alert  and patient cooperative  Airway & Oxygen Therapy: Patient Spontanous Breathing and Patient connected to face mask oxygen  Post-op Assessment: Report given to PACU RN, Post -op Vital signs reviewed and stable and Patient moving all extremities  Post vital signs: Reviewed and stable  Complications: No apparent anesthesia complications

## 2014-03-06 NOTE — Interval H&P Note (Signed)
History and Physical Interval Note:  03/06/2014 9:50 AM  Regina Watts  has presented today for surgery, with the diagnosis of left breast cancer  The various methods of treatment have been discussed with the patient and family.  Husband is here.  Interpreter is with patient.  After consideration of risks, benefits and other options for treatment, the patient has consented to  Procedure(s): BREAST LUMPECTOMY WITH NEEDLE LOCALIZATION AND AXILLARY SENTINEL LYMPH NODE BX (Left) INSERTION PORT-A-CATH (N/A) as a surgical intervention .  The patient's history has been reviewed, patient examined, no change in status, stable for surgery.  I have reviewed the patient's chart and labs.  Questions were answered to the patient's satisfaction.     Shann Medal

## 2014-03-07 ENCOUNTER — Encounter (HOSPITAL_BASED_OUTPATIENT_CLINIC_OR_DEPARTMENT_OTHER): Payer: Self-pay | Admitting: Surgery

## 2014-03-08 ENCOUNTER — Other Ambulatory Visit: Payer: Self-pay | Admitting: Oncology

## 2014-03-09 ENCOUNTER — Telehealth (INDEPENDENT_AMBULATORY_CARE_PROVIDER_SITE_OTHER): Payer: Self-pay

## 2014-03-09 NOTE — Telephone Encounter (Signed)
Pt and her husband called per Dr Pollie Friar request for the path results. [place] working the urgent office.  I did review the path result with him and advised him that Dr Lucia Gaskins would call them back next week when he returns to discuss the path result further and next step in the pts treatment plan. Pts husband states he understands and will await call back next week.

## 2014-03-14 ENCOUNTER — Telehealth: Payer: Self-pay | Admitting: Oncology

## 2014-03-14 NOTE — Telephone Encounter (Signed)
, °

## 2014-03-18 ENCOUNTER — Other Ambulatory Visit: Payer: Self-pay | Admitting: Oncology

## 2014-03-19 ENCOUNTER — Encounter: Payer: Self-pay | Admitting: Oncology

## 2014-03-19 ENCOUNTER — Ambulatory Visit (HOSPITAL_BASED_OUTPATIENT_CLINIC_OR_DEPARTMENT_OTHER): Payer: BC Managed Care – PPO | Admitting: Oncology

## 2014-03-19 ENCOUNTER — Other Ambulatory Visit (HOSPITAL_BASED_OUTPATIENT_CLINIC_OR_DEPARTMENT_OTHER): Payer: BC Managed Care – PPO

## 2014-03-19 ENCOUNTER — Telehealth: Payer: Self-pay | Admitting: Oncology

## 2014-03-19 VITALS — BP 131/76 | HR 96 | Temp 98.5°F | Resp 18 | Ht 67.0 in | Wt 169.7 lb

## 2014-03-19 DIAGNOSIS — C50219 Malignant neoplasm of upper-inner quadrant of unspecified female breast: Secondary | ICD-10-CM

## 2014-03-19 DIAGNOSIS — H905 Unspecified sensorineural hearing loss: Secondary | ICD-10-CM

## 2014-03-19 DIAGNOSIS — H9190 Unspecified hearing loss, unspecified ear: Secondary | ICD-10-CM

## 2014-03-19 DIAGNOSIS — C50212 Malignant neoplasm of upper-inner quadrant of left female breast: Secondary | ICD-10-CM

## 2014-03-19 DIAGNOSIS — Z853 Personal history of malignant neoplasm of breast: Secondary | ICD-10-CM

## 2014-03-19 DIAGNOSIS — Z171 Estrogen receptor negative status [ER-]: Secondary | ICD-10-CM

## 2014-03-19 LAB — CBC WITH DIFFERENTIAL/PLATELET
BASO%: 0.5 % (ref 0.0–2.0)
BASOS ABS: 0 10*3/uL (ref 0.0–0.1)
EOS ABS: 0.1 10*3/uL (ref 0.0–0.5)
EOS%: 1.9 % (ref 0.0–7.0)
HCT: 41.2 % (ref 34.8–46.6)
HGB: 13.8 g/dL (ref 11.6–15.9)
LYMPH%: 23.6 % (ref 14.0–49.7)
MCH: 31 pg (ref 25.1–34.0)
MCHC: 33.4 g/dL (ref 31.5–36.0)
MCV: 92.9 fL (ref 79.5–101.0)
MONO#: 0.3 10*3/uL (ref 0.1–0.9)
MONO%: 4.4 % (ref 0.0–14.0)
NEUT#: 4.1 10*3/uL (ref 1.5–6.5)
NEUT%: 69.6 % (ref 38.4–76.8)
Platelets: 161 10*3/uL (ref 145–400)
RBC: 4.44 10*6/uL (ref 3.70–5.45)
RDW: 12.5 % (ref 11.2–14.5)
WBC: 5.9 10*3/uL (ref 3.9–10.3)
lymph#: 1.4 10*3/uL (ref 0.9–3.3)

## 2014-03-19 LAB — COMPREHENSIVE METABOLIC PANEL (CC13)
ALT: 54 U/L (ref 0–55)
AST: 34 U/L (ref 5–34)
Albumin: 3.9 g/dL (ref 3.5–5.0)
Alkaline Phosphatase: 96 U/L (ref 40–150)
Anion Gap: 13 mEq/L — ABNORMAL HIGH (ref 3–11)
BUN: 17 mg/dL (ref 7.0–26.0)
CALCIUM: 9.9 mg/dL (ref 8.4–10.4)
CHLORIDE: 106 meq/L (ref 98–109)
CO2: 24 mEq/L (ref 22–29)
Creatinine: 0.9 mg/dL (ref 0.6–1.1)
Glucose: 190 mg/dl — ABNORMAL HIGH (ref 70–140)
Potassium: 4.6 mEq/L (ref 3.5–5.1)
Sodium: 143 mEq/L (ref 136–145)
Total Bilirubin: 0.5 mg/dL (ref 0.20–1.20)
Total Protein: 6.9 g/dL (ref 6.4–8.3)

## 2014-03-19 NOTE — Telephone Encounter (Signed)
, °

## 2014-03-19 NOTE — Progress Notes (Signed)
OFFICE PROGRESS NOTE   03/19/2014   Physicians: Alphonsa Overall, Tyler Pita, Karrar Husein, Arvella Nigh, Earle Gell   INTERVAL HISTORY:  Patient is seen, together with sign language interpreter, now post lumpectomy with sentinel node evaluation for left triple negative breast cancer, this done 03-06-2014 by Dr Lucia Gaskins. This is second primary breast cancer, with right T2N0M0 in 2002.  Surgery was done outpatient and she has been recovering without complications. She is to see Dr Lucia Gaskins on 03-22-14. Pathology [phone]) found invasive grade II ductal carcinoma 1.8 cm with final margins clear by >=1 cm, one sentinel node negative, No DCIS or lobular neoplasia identified, no definite lymphovascular invasion identified. Previous biopsy ([id number]) had ER/PR/Her-2 negative with Ki-67 66%. Preoperatively she had CT CAP and MRI liver, without apparent metastatic disease, and breast imaging including MRI.  She had genetics testing with Breast/Ovarian Cancer Gene Panel  results available 02-18-14, with no pathogenic mutation in those genes, tho 3 variants of uncertain significance (ATM, [id number]; ATM, c.8674G>A and PMS2, C.1309C>T); genetics counseling program will let her know if these are re-classified as significant variants in future.  She will have individual chemotherapy education at this office with sign language interpreter within the week.   She had PAC placed at time of the lumpectomy.  ONCOLOGIC HISTORY History is of right breast cancer diagnosed January 2002 when patient was age 65 and premenopausal, T2N0M0 (4cm grade 3 invasive ductal carcinoma, <25% high grade DCIS, S phase 8.6%), ER negative, PR negative, HER 2 positive. She had lumpectomy with 2 negative sentinel nodes, adjuvant chemotherapy by Dr Starr Sinclair with adriamycin cytoxan x 4 cycles from March 2002 thru May 2002 (total adriamycin dose 409 mg with weight 142, height 5'7", for total adriamycin 235 mg/m2), and  local radiation (50.4 Gy with boost to 60.4) Gy Note she declined PAC and received the chemotherapy in 2002 by peripheral vein. I followed her beginning Sept 2003, after Dr Pearlie Oyster left [place], until last previous visit 07-2011. She declined TEACH trial with lapatinib in 2007 and 2008. Last breast MRI previously was 08-2011.  Patient had infertility treatments at [facility] in 2005, daughter born 02-01-2005. Menses had been very infrequent after chemo until the infertility treatment, then stopped after that pregnancy.     Review of systems as above, also: No significant discomfort from lumpectomy/node or PAC, and has not used pain medication. No respiratory, GI, musculoskeletal symptoms. Remainder of 10 point Review of Systems negative.  Husband came from [facility] for ~ a week for her surgery and plans to come at least intermittently during chemo. His work has been very Equities trader.  Objective:  Vital signs in last 24 hours:  BP 131/76  Pulse 96  Temp(Src) 98.5 F (36.9 C) (Oral)  Resp 18  Ht 5' 7" (1.702 m)  Wt 169 lb 11.2 oz (76.975 kg)  BMI 26.57 kg/m2  Alert, oriented and appropriate. Ambulatory without difficulty  HEENT:PERRL, sclerae not icteric. Oral mucosa moist without lesions, posterior pharynx clear.  Neck supple. No JVD.  Lymphatics:no cervical,suraclavicular, axillary or inguinal adenopathy Resp: clear to auscultation bilaterally and normal percussion bilaterally Cardio: regular rate and rhythm. No gallop. GI: soft, nontender, not distended, no mass or organomegaly. Normally active bowel sounds. Surgical incision not remarkable. Musculoskeletal/ Extremities: without pitting edema, cords, tenderness. No swelling either UE. Neuro: Deafness. no peripheral neuropathy. Otherwise nonfocal Skin without rash, ecchymosis, petechiae Breasts: Right breast with well healed lumpectomy scar, no dominant mass, no skin or nipple findings. Left breast with lumpectomy  scar  superiorly which appears to be healing well, no obvious hematoma, no erythema and minimal resolving ecchymoses. No other dominant mass in left breast and no skin or nipple findings.  Axillae benign. Portacath-steristrips in place, minimal resolving ecchymosis, positioning good, not tender.  Lab Results:  Results for orders placed in visit on 03/19/14  CBC WITH DIFFERENTIAL      Result Value Ref Range   WBC 5.9  3.9 - 10.3 10e3/uL   NEUT# 4.1  1.5 - 6.5 10e3/uL   HGB 13.8  11.6 - 15.9 g/dL   HCT 41.2  34.8 - 46.6 %   Platelets 161  145 - 400 10e3/uL   MCV 92.9  79.5 - 101.0 fL   MCH 31.0  25.1 - 34.0 pg   MCHC 33.4  31.5 - 36.0 g/dL   RBC 4.44  3.70 - 5.45 10e6/uL   RDW 12.5  11.2 - 14.5 %   lymph# 1.4  0.9 - 3.3 10e3/uL   MONO# 0.3  0.1 - 0.9 10e3/uL   Eosinophils Absolute 0.1  0.0 - 0.5 10e3/uL   Basophils Absolute 0.0  0.0 - 0.1 10e3/uL   NEUT% 69.6  38.4 - 76.8 %   LYMPH% 23.6  14.0 - 49.7 %   MONO% 4.4  0.0 - 14.0 %   EOS% 1.9  0.0 - 7.0 %   BASO% 0.5  0.0 - 2.0 %  COMPREHENSIVE METABOLIC PANEL ([id number])      Result Value Ref Range   Sodium 143  136 - 145 mEq/L   Potassium 4.6  3.5 - 5.1 mEq/L   Chloride 106  98 - 109 mEq/L   CO2 24  22 - 29 mEq/L   Glucose 190 (*) 70 - 140 mg/dl   BUN 17.0  7.0 - 26.0 mg/dL   Creatinine 0.9  0.6 - 1.1 mg/dL   Total Bilirubin 0.50  0.20 - 1.20 mg/dL   Alkaline Phosphatase 96  40 - 150 U/L   AST 34  5 - 34 U/L   ALT 54  0 - 55 U/L   Total Protein 6.9  6.4 - 8.3 g/dL   Albumin 3.9  3.5 - 5.0 g/dL   Calcium 9.9  8.4 - 10.4 mg/dL   Anion Gap 13 (*) 3 - 11 mEq/L     Studies/Results:  PATHOLOGY ([id number]) Patient: Regina Watts A Collected: 03/06/2014 Client: [facility] Accession: [id number] Received: 03/06/2014 Alphonsa Overall  Diagnosis 1. Lymph node, sentinel, biopsy, Left axillary - ONE BENIGN LYMPH NODE WITH NO TUMOR SEEN (0/1). 2. Breast, lumpectomy, Left - INVASIVE GRADE II DUCTAL CARCINOMA SPANNING 1.8 CM IN  GREATEST DIMENSION. - INVASIVE TUMOR IS EXTREMELY CLOSE (0.1 CM) TO POSTERIOR MARGIN ON INITIAL LUMPECTOMY SPECIMEN, PLEASE SEE SPECIMEN # 3 FOR FINAL MARGIN STATUS. - OTHER MARGINS ARE NEGATIVE. - SEE ONCOLOGY TEMPLATE. 3. Breast, excision, Left posterior margin - BENIGN BREAST PARENCHYMA. - NO TUMOR SEEN. Microscopic Comment 2. BREAST, INVASIVE TUMOR, WITH LYMPH NODE SAMPLING Specimen, including laterality and lymph node sampling (sentinel, non-sentinel): Left partial breast with additional left posterior margin and left sentinel lymph node sampling. Procedure: Left breast lumpectomy with additional left posterior margin excision and sentinel lymph node biopsy. Histologic type: Invasive ductal carcinoma. Grade: II. Tubule formation: 3. Nuclear pleomorphism: 2. Mitotic:2. Tumor size (gross measurement): 1.8 cm. Margins: Invasive, distance to closest margin: Invasive tumor is extremely close (0.1 cm) to posterior margin on initial lumpectomy specimen. However, the additional left posterior margin specimen does not  demonstrate tumor; therefore, tumor is greater than 1 cm away from the nearest margin. Lymphovascular invasion: Definitive lymph/vascular invasion is not identified. Ductal carcinoma in situ: Not identified. Lobular neoplasia: No. Tumor focality: Unifocal. Treatment effect: N/A.  Extent of tumor: Confined to breast parenchyma. Skin: Not involved. Nipple: Not received. Skeletal muscle: Not received. Lymph nodes: Examined: 1 Sentinel. 0 Non-sentinel. 1 Total. Lymph nodes with metastasis: 0. Breast prognostic profile: Performed on previous case [id number] Estrogen receptor: 0%, negative. Progesterone receptor: 0%, negative. Her 2 neu: 0.85 ratio, no amplification. Ki-67: 66%. Non-neoplastic breast: Fibrocystic changes. TNM: pT1c, pN0.  Medications: I have reviewed the patient's current medications. Pharmacy is [facility]. Prescriptions will be sent for  EMLA, zofran 8 mg bid prn starting on day 3, ativan 0.5 mg 1-2 SL or po q 6 hr prn will make drowsy, compazine 10 mg q 6 hr prn, and decadron 4 mg two tabs AM after chemo with food then 8 mg bid x 2 days.  DISCUSSION: With excellent assistance of sign language interpreter, we have discussed pathology information and genetics testing as above. We have reviewed rationale for adjuvant chemotherapy in triple negative breast cancer, as well as her previous chemotherapy in 2002. We have discussed fact that adriamycin in dose dense regimen can be very useful for triple negative breast cancers, however total amount of this drug is limited by cardiac tolerance. As she has had only total of 235 mg/m2 adriamycin with chemotherapy in 2002, it is possible that we can give additional adriamycin in dose dense fashion for at least 2 cycles, tho she needs cardiac evaluation prior. Following her visit, I learned from [place] that PREVENT study thru [facility] is just now available, this using statins in attempt to prevent anthracycline toxicity, monitoring with MRIs done at [facility] at baseline, 6 mo and 24 months. I did not discuss the PREVENT trial with Cecille Rubin at visit, but will share that information with her if [facility] finds her eligible. I have discussed following initial ~ 2 cycles of dose dense AC with taxotere cytoxan 3-4 cycles.  Dior is particularly upset by losing her hair again, but is willing to do this. I mentioned wig boutique at [facility].  Consent will be done after full chemotherapy education and prior to first chemotherapy.  I did not address possible RT following chemo with rest of conversation today. She is known to Dr Tyler Pita.  EPIC messaging sent to collaborative RNs, chemo ed RNs and Research re prescriptions, antiemetic teaching, chemo plan, possible PREVENT study, timing of chemo, alopecia concerns in order to coordinate with sign language interpreter as  possible.  Assessment/Plan: 1.New diagnosis triple negative invasive ductal left breast cancer: 1.8 cm with clear margins and single negative sentinel node (pT1cN0   M0). Post lumpectomy with single sentinel node on 03-06-14. For adjuvant chemotherapy, regimen to be confirmed after cardiac evaluation (echocardiogram ordered now, possibly also MRI evaluation on PREVENT study if eligible and if she agrees), but would like to give dose dense AC x2 followed by 3-4 cycles of taxotere cytoxan if possible. I need to see her back after teaching to confirm all of plan, and timing of first chemotherapy depends on rest of scheduling including possible PREVENT trial, tho we mentioned beginning around 03-30-14 (this may not be ideal with office closed for [facility]). As above, I did not address RT today, but will copy this note to Dr Tammi Klippel. 2.history of T2N0 ER PR negative HER 2 + right  breast cancer in 2002 at age 3 and premenopausal: treatment as above including 4 cycles of AC for total adriamycin dose calculated now at 235 mg/m2. That adjuvant treatment did not include herceptin in 2002 and subsequently patient declined TEACH trial. No findings of concern on right by mammogram or MRI now  3.PAC placed at left breast surgery - thank you! 4.negative colonoscopy ~ Jan 2015  5.post menopausal since ~ age 75  6.congenital deafness. Needs sign language interpreter with all visits.    Patient understood all of our discussion and had all questions answered to her satisfaction. Sign language interpreter was present for all conversation (PE done without interpreter present, as has been patient's preference with me for years).   Time spent 60 min including >50% counseling and coordination of care.    Gordy Levan, MD   03/19/2014, 9:33 PM

## 2014-03-20 ENCOUNTER — Encounter: Payer: Self-pay | Admitting: Oncology

## 2014-03-20 ENCOUNTER — Other Ambulatory Visit: Payer: Self-pay

## 2014-03-20 ENCOUNTER — Encounter: Payer: Self-pay | Admitting: *Deleted

## 2014-03-20 DIAGNOSIS — H905 Unspecified sensorineural hearing loss: Secondary | ICD-10-CM | POA: Insufficient documentation

## 2014-03-20 NOTE — Progress Notes (Unsigned)
Medical Oncology  Communicated with Dr Ledon Snare, who would be glad to see her again, generally would begin RT ~ 4 weeks after chemo.  Not discussed with patient as yet.  Godfrey Pick, MD

## 2014-03-20 NOTE — Progress Notes (Signed)
03/20/2014 Referred by Dr. Marko Plume for the [name],  Preventing Anthracycline Cardiovascular Toxicity with Statins (PREVENT).  She is not eligible for 2 reasons, She is taking a statin and there is a panel of timed neurocognitive testing procedures to be administered before and during the study. Regina Watts is deaf and communicates using an interpreter. These tests cannot be administered through the use of an interpreter.

## 2014-03-21 ENCOUNTER — Other Ambulatory Visit: Payer: Self-pay

## 2014-03-21 DIAGNOSIS — C50212 Malignant neoplasm of upper-inner quadrant of left female breast: Secondary | ICD-10-CM

## 2014-03-21 MED ORDER — ONDANSETRON HCL 8 MG PO TABS: 8.0000 mg | ORAL_TABLET | Freq: Two times a day (BID) | ORAL | Status: DC | PRN

## 2014-03-21 MED ORDER — PROCHLORPERAZINE MALEATE 10 MG PO TABS: 10.0000 mg | ORAL_TABLET | Freq: Four times a day (QID) | ORAL | Status: DC | PRN

## 2014-03-21 MED ORDER — LORAZEPAM 0.5 MG PO TABS
ORAL_TABLET | ORAL | Status: DC
Start: 2014-03-21 — End: 2014-03-21

## 2014-03-21 MED ORDER — LIDOCAINE-PRILOCAINE 2.5-2.5 % EX CREA: TOPICAL_CREAM | CUTANEOUS | Status: DC

## 2014-03-21 MED ORDER — LORAZEPAM 0.5 MG PO TABS: ORAL_TABLET | ORAL | Status: DC

## 2014-03-21 MED ORDER — DEXAMETHASONE 4 MG PO TABS: ORAL_TABLET | ORAL | Status: DC

## 2014-03-21 NOTE — Addendum Note (Signed)
Addended by: Baruch Merl on: 03/21/2014 03:36 PM   Modules accepted: Orders

## 2014-03-22 ENCOUNTER — Telehealth: Payer: Self-pay | Admitting: Oncology

## 2014-03-22 ENCOUNTER — Telehealth (INDEPENDENT_AMBULATORY_CARE_PROVIDER_SITE_OTHER): Payer: Self-pay

## 2014-03-22 ENCOUNTER — Encounter (INDEPENDENT_AMBULATORY_CARE_PROVIDER_SITE_OTHER): Payer: BC Managed Care – PPO | Admitting: Surgery

## 2014-03-22 NOTE — Telephone Encounter (Signed)
Interpreter services number for patient  Is [phone] prefers Lang Snow

## 2014-03-22 NOTE — Telephone Encounter (Signed)
, °

## 2014-03-26 ENCOUNTER — Telehealth: Payer: Self-pay | Admitting: *Deleted

## 2014-03-26 ENCOUNTER — Other Ambulatory Visit: Payer: Self-pay | Admitting: *Deleted

## 2014-03-26 ENCOUNTER — Telehealth: Payer: Self-pay | Admitting: Oncology

## 2014-03-26 ENCOUNTER — Other Ambulatory Visit: Payer: BC Managed Care – PPO

## 2014-03-26 NOTE — Telephone Encounter (Signed)
Per staff message and POF I have scheduled appts.  JMW  

## 2014-03-26 NOTE — Telephone Encounter (Signed)
, °

## 2014-03-27 ENCOUNTER — Ambulatory Visit (HOSPITAL_COMMUNITY)
Admission: RE | Admit: 2014-03-27 | Discharge: 2014-03-27 | Disposition: A | Discharge status: Home / Self Care | Payer: BC Managed Care – PPO | Source: Ambulatory Visit | Attending: Oncology | Admitting: Oncology

## 2014-03-27 DIAGNOSIS — C50219 Malignant neoplasm of upper-inner quadrant of unspecified female breast: Secondary | ICD-10-CM | POA: Insufficient documentation

## 2014-03-27 DIAGNOSIS — I059 Rheumatic mitral valve disease, unspecified: Secondary | ICD-10-CM | POA: Diagnosis not present

## 2014-03-27 DIAGNOSIS — I079 Rheumatic tricuspid valve disease, unspecified: Secondary | ICD-10-CM | POA: Insufficient documentation

## 2014-03-27 DIAGNOSIS — Z01818 Encounter for other preprocedural examination: Secondary | ICD-10-CM | POA: Insufficient documentation

## 2014-03-27 DIAGNOSIS — Z853 Personal history of malignant neoplasm of breast: Secondary | ICD-10-CM

## 2014-03-27 DIAGNOSIS — C50212 Malignant neoplasm of upper-inner quadrant of left female breast: Secondary | ICD-10-CM

## 2014-03-27 NOTE — Progress Notes (Signed)
Echocardiogram 2D Echocardiogram has been performed.  Alyson Locket Cheyrl Buley 03/27/2014, 12:01 PM

## 2014-03-28 ENCOUNTER — Encounter: Payer: Self-pay | Admitting: *Deleted

## 2014-03-29 ENCOUNTER — Ambulatory Visit (HOSPITAL_BASED_OUTPATIENT_CLINIC_OR_DEPARTMENT_OTHER): Payer: BC Managed Care – PPO | Admitting: Oncology

## 2014-03-29 ENCOUNTER — Other Ambulatory Visit (HOSPITAL_BASED_OUTPATIENT_CLINIC_OR_DEPARTMENT_OTHER): Payer: BC Managed Care – PPO

## 2014-03-29 ENCOUNTER — Encounter: Payer: Self-pay | Admitting: Oncology

## 2014-03-29 ENCOUNTER — Other Ambulatory Visit: Payer: Self-pay | Admitting: Oncology

## 2014-03-29 VITALS — BP 128/80 | HR 76 | Temp 98.4°F | Resp 17 | Ht 67.0 in | Wt 171.8 lb

## 2014-03-29 DIAGNOSIS — Z853 Personal history of malignant neoplasm of breast: Secondary | ICD-10-CM | POA: Diagnosis not present

## 2014-03-29 DIAGNOSIS — C50919 Malignant neoplasm of unspecified site of unspecified female breast: Secondary | ICD-10-CM

## 2014-03-29 DIAGNOSIS — C50212 Malignant neoplasm of upper-inner quadrant of left female breast: Secondary | ICD-10-CM

## 2014-03-29 DIAGNOSIS — C50219 Malignant neoplasm of upper-inner quadrant of unspecified female breast: Secondary | ICD-10-CM | POA: Diagnosis not present

## 2014-03-29 DIAGNOSIS — C50912 Malignant neoplasm of unspecified site of left female breast: Secondary | ICD-10-CM

## 2014-03-29 DIAGNOSIS — Z171 Estrogen receptor negative status [ER-]: Secondary | ICD-10-CM | POA: Diagnosis not present

## 2014-03-29 DIAGNOSIS — Z780 Asymptomatic menopausal state: Secondary | ICD-10-CM

## 2014-03-29 LAB — COMPREHENSIVE METABOLIC PANEL (CC13)
ALBUMIN: 4.1 g/dL (ref 3.5–5.0)
ALK PHOS: 102 U/L (ref 40–150)
ALT: 66 U/L — AB (ref 0–55)
AST: 48 U/L — ABNORMAL HIGH (ref 5–34)
Anion Gap: 9 mEq/L (ref 3–11)
BUN: 15.8 mg/dL (ref 7.0–26.0)
CO2: 27 mEq/L (ref 22–29)
Calcium: 9.6 mg/dL (ref 8.4–10.4)
Chloride: 107 mEq/L (ref 98–109)
Creatinine: 0.8 mg/dL (ref 0.6–1.1)
Glucose: 111 mg/dl (ref 70–140)
Potassium: 4.4 mEq/L (ref 3.5–5.1)
SODIUM: 142 meq/L (ref 136–145)
TOTAL PROTEIN: 7.2 g/dL (ref 6.4–8.3)
Total Bilirubin: 0.55 mg/dL (ref 0.20–1.20)

## 2014-03-29 LAB — CBC WITH DIFFERENTIAL/PLATELET
BASO%: 0.5 % (ref 0.0–2.0)
Basophils Absolute: 0 10*3/uL (ref 0.0–0.1)
EOS%: 3 % (ref 0.0–7.0)
Eosinophils Absolute: 0.1 10*3/uL (ref 0.0–0.5)
HCT: 42.3 % (ref 34.8–46.6)
HGB: 14.1 g/dL (ref 11.6–15.9)
LYMPH%: 26.6 % (ref 14.0–49.7)
MCH: 31 pg (ref 25.1–34.0)
MCHC: 33.2 g/dL (ref 31.5–36.0)
MCV: 93.1 fL (ref 79.5–101.0)
MONO#: 0.3 10*3/uL (ref 0.1–0.9)
MONO%: 7.5 % (ref 0.0–14.0)
NEUT%: 62.4 % (ref 38.4–76.8)
NEUTROS ABS: 2.9 10*3/uL (ref 1.5–6.5)
Platelets: 153 10*3/uL (ref 145–400)
RBC: 4.55 10*6/uL (ref 3.70–5.45)
RDW: 13 % (ref 11.2–14.5)
WBC: 4.7 10*3/uL (ref 3.9–10.3)
lymph#: 1.2 10*3/uL (ref 0.9–3.3)

## 2014-03-29 NOTE — Progress Notes (Signed)
OFFICE PROGRESS NOTE   03/29/2014   Physicians:David Cristy Hilts, Karrar Husein, John McComb, Earle Gell   INTERVAL HISTORY:  Patient is seen, together with husband and sign language interpreter, to complete discussion and planning for adjuvant treatment for recently diagnosed T1N0 triple negative left breast cancer. She is to begin chemotherapy with dose dense AC on 03-30-14 and will have neulasta on 03-31-14.  We have reviewed the surgical findings, including information in EMR from Dr Lucia Gaskins that he feels margins are clear, with additional tissue taken during procedure. We have discussed 2 cycles of dose dense AC due to prior adriamycin doses, then 3-4 cycles of taxotere cytoxan. As nausea was significant problem with [facility] for her first breast cancer, we will set up IVF with the neulasta on day 2.  Discussed referral back to Dr Tammi Klippel, as she may be appropriate for local radiation.  I did not discuss PREVENT trial with patient, as Research let me know prior to this visit that she is not eligible due to communication limitations.    ONCOLOGIC HISTORY History is of right breast cancer diagnosed January 2002 when patient was age 89 and premenopausal, T2N0M0 (4cm grade 3 invasive ductal carcinoma, <25% high grade DCIS, S phase 8.6%), ER negative, PR negative, HER 2 positive. She had lumpectomy with 2 negative sentinel nodes, adjuvant chemotherapy by Dr Starr Sinclair with adriamycin cytoxan x 4 cycles from March 2002 thru May 2002 (total adriamycin dose 409 mg with weight 142, height 5'7", for total adriamycin 235 mg/m2), and local radiation (50.4 Gy with boost to 60.4) Gy Note she declined PAC and received the chemotherapy in 2002 by peripheral vein. I followed her beginning Sept 2003, after Dr Pearlie Oyster left [place], until last previous visit 07-2011. She declined TEACH trial with lapatinib in 2007 and 2008. Last breast MRI previously was 08-2011.  Patient had infertility treatments at  [facility] in 2005, daughter born 02-01-2005. Menses had been very infrequent after chemo until the infertility treatment, then stopped after that pregnancy.     Review of systems as above, also: Appetite and energy good. No problems from lumpectomy and sentinel node, or from [facility].  Remainder of 10 point Review of Systems negative.  Objective:  Vital signs in last 24 hours:  BP 128/80  Pulse 76  Temp(Src) 98.4 F (36.9 C) (Oral)  Resp 17  Ht 5\' 7"  (1.702 m)  Wt 171 lb 12.8 oz (77.928 kg)  BMI 26.90 kg/m2  SpO2 96%  Alert, looks comfortable and follows discussion well with assistance of interpreter. Ambulatory without difficulty.  Portacath-without erythema or tenderness  Lab Results:  Results for orders placed in visit on 03/29/14  CBC WITH DIFFERENTIAL      Result Value Ref Range   WBC 4.7  3.9 - 10.3 10e3/uL   NEUT# 2.9  1.5 - 6.5 10e3/uL   HGB 14.1  11.6 - 15.9 g/dL   HCT 42.3  34.8 - 46.6 %   Platelets 153  145 - 400 10e3/uL   MCV 93.1  79.5 - 101.0 fL   MCH 31.0  25.1 - 34.0 pg   MCHC 33.2  31.5 - 36.0 g/dL   RBC 4.55  3.70 - 5.45 10e6/uL   RDW 13.0  11.2 - 14.5 %   lymph# 1.2  0.9 - 3.3 10e3/uL   MONO# 0.3  0.1 - 0.9 10e3/uL   Eosinophils Absolute 0.1  0.0 - 0.5 10e3/uL   Basophils Absolute 0.0  0.0 - 0.1 10e3/uL   NEUT%  62.4  38.4 - 76.8 %   LYMPH% 26.6  14.0 - 49.7 %   MONO% 7.5  0.0 - 14.0 %   EOS% 3.0  0.0 - 7.0 %   BASO% 0.5  0.0 - 2.0 %  COMPREHENSIVE METABOLIC PANEL (IH47)      Result Value Ref Range   Sodium 142  136 - 145 mEq/L   Potassium 4.4  3.5 - 5.1 mEq/L   Chloride 107  98 - 109 mEq/L   CO2 27  22 - 29 mEq/L   Glucose 111  70 - 140 mg/dl   BUN 15.8  7.0 - 26.0 mg/dL   Creatinine 0.8  0.6 - 1.1 mg/dL   Total Bilirubin 0.55  0.20 - 1.20 mg/dL   Alkaline Phosphatase 102  40 - 150 U/L   AST 48 (*) 5 - 34 U/L   ALT 66 (*) 0 - 55 U/L   Total Protein 7.2  6.4 - 8.3 g/dL   Albumin 4.1  3.5 - 5.0 g/dL   Calcium 9.6  8.4 - 10.4 mg/dL   Anion Gap 9   3 - 11 mEq/L     Studies/Results:  No results found.  Medications: I have reviewed the patient's current medications. We have discussed treatment as above, antiemetics including decadron x 3 days after chemo, timing of zofran (due to aloxi) and compazine/ ativan,  EMLA and neulasta.  DISCUSSION: Patient and husband had all questions answered to their satisfaction. They understand that they can contact this office at any time prior to scheduled visits.  Husband will stay at least thru this weekend, tho his work in [place] is very accommodating if he needs to stay longer. He has just driven to [place] from [place] today.  Assessment/Plan: 1.New diagnosis triple negative invasive ductal left breast cancer: 1.8 cm with clear margins and single negative sentinel node (pT1cN0 M0). Post lumpectomy with single sentinel node on 03-06-14. For adjuvant chemotherapy with 2 cycles dose dense AC (due to prior adria doses) then taxotere cytoxan q 3 weeks x ~ 4 cycles. Will ask Dr Tammi Klippel to see in consultation. 2.history of T2N0 ER PR negative HER 2 + right breast cancer in 2002 at age 76 and premenopausal: treatment as above including 4 cycles of AC for total adriamycin dose calculated now at 235 mg/m2. That adjuvant treatment did not include herceptin in 2002 and subsequently patient declined TEACH trial. No findings of concern on right by mammogram or MRI now  3.PAC placed at left breast surgery  4.negative colonoscopy ~ Jan 2015  5.post menopausal since ~ age 89  6.congenital deafness. Needs sign language interpreter with all visits.    Patient and husband are in agreement with plan. Chemotherapy orders completed. IVF with neulasta day 2. I will see her back 04-09-14 with labs.  Time spent 20 min, all in counseling and coordination of care   Gordy Levan, MD   03/29/2014, 5:13 PM

## 2014-03-30 ENCOUNTER — Other Ambulatory Visit: Payer: Self-pay | Admitting: Oncology

## 2014-03-30 ENCOUNTER — Ambulatory Visit: Payer: Medicare Other | Admitting: Oncology

## 2014-03-30 ENCOUNTER — Other Ambulatory Visit: Payer: Medicare Other

## 2014-03-30 ENCOUNTER — Encounter: Payer: Self-pay | Admitting: *Deleted

## 2014-03-30 ENCOUNTER — Telehealth: Payer: Self-pay | Admitting: Oncology

## 2014-03-30 ENCOUNTER — Telehealth: Payer: Self-pay | Admitting: *Deleted

## 2014-03-30 ENCOUNTER — Ambulatory Visit (HOSPITAL_BASED_OUTPATIENT_CLINIC_OR_DEPARTMENT_OTHER): Payer: BC Managed Care – PPO

## 2014-03-30 VITALS — BP 120/76 | HR 80 | Temp 98.6°F | Resp 18

## 2014-03-30 DIAGNOSIS — C50212 Malignant neoplasm of upper-inner quadrant of left female breast: Secondary | ICD-10-CM

## 2014-03-30 DIAGNOSIS — Z5111 Encounter for antineoplastic chemotherapy: Secondary | ICD-10-CM

## 2014-03-30 DIAGNOSIS — C50219 Malignant neoplasm of upper-inner quadrant of unspecified female breast: Secondary | ICD-10-CM

## 2014-03-30 MED ORDER — PALONOSETRON HCL INJECTION 0.25 MG/5ML
0.2500 mg | Freq: Once | INTRAVENOUS | Status: AC
  Administered 2014-03-30: 0.25 mg via INTRAVENOUS

## 2014-03-30 MED ORDER — SODIUM CHLORIDE 0.9 % IV SOLN
Freq: Once | INTRAVENOUS | Status: AC
  Administered 2014-03-30: 09:00:00 via INTRAVENOUS

## 2014-03-30 MED ORDER — DEXAMETHASONE SODIUM PHOSPHATE 20 MG/5ML IJ SOLN
12.0000 mg | Freq: Once | INTRAMUSCULAR | Status: AC
  Administered 2014-03-30: 12 mg via INTRAVENOUS

## 2014-03-30 MED ORDER — SODIUM CHLORIDE 0.9 % IV SOLN: INTRAVENOUS | Status: DC

## 2014-03-30 MED ORDER — SODIUM CHLORIDE 0.9 % IJ SOLN
10.0000 mL | INTRAMUSCULAR | Status: DC | PRN
  Administered 2014-03-30: 10 mL
  Filled 2014-03-30: qty 10

## 2014-03-30 MED ORDER — DEXAMETHASONE SODIUM PHOSPHATE 20 MG/5ML IJ SOLN
INTRAMUSCULAR | Status: AC
  Filled 2014-03-30: qty 5

## 2014-03-30 MED ORDER — SODIUM CHLORIDE 0.9 % IV SOLN
600.0000 mg/m2 | Freq: Once | INTRAVENOUS | Status: AC
  Administered 2014-03-30: 1140 mg via INTRAVENOUS
  Filled 2014-03-30: qty 57

## 2014-03-30 MED ORDER — HEPARIN SOD (PORK) LOCK FLUSH 100 UNIT/ML IV SOLN
500.0000 [IU] | Freq: Once | INTRAVENOUS | Status: AC | PRN
  Administered 2014-03-30: 500 [IU]
  Filled 2014-03-30: qty 5

## 2014-03-30 MED ORDER — PALONOSETRON HCL INJECTION 0.25 MG/5ML
INTRAVENOUS | Status: AC
  Filled 2014-03-30: qty 5

## 2014-03-30 MED ORDER — DOXORUBICIN HCL CHEMO IV INJECTION 2 MG/ML
60.0000 mg/m2 | Freq: Once | INTRAVENOUS | Status: AC
Start: 2014-03-30 — End: 2014-03-30
  Administered 2014-03-30: 114 mg via INTRAVENOUS
  Filled 2014-03-30: qty 57

## 2014-03-30 MED ORDER — SODIUM CHLORIDE 0.9 % IV SOLN
150.0000 mg | Freq: Once | INTRAVENOUS | Status: AC
  Administered 2014-03-30: 150 mg via INTRAVENOUS
  Filled 2014-03-30: qty 5

## 2014-03-30 MED ORDER — PROCHLORPERAZINE EDISYLATE 5 MG/ML IJ SOLN: 10.0000 mg | Freq: Once | INTRAMUSCULAR | Status: DC | PRN

## 2014-03-30 NOTE — Patient Instructions (Signed)
[facility] Discharge Instructions for Patients Receiving Chemotherapy  Today you received the following chemotherapy agents ADRIAMYCIN, CYTOXAN To help prevent nausea and vomiting after your treatment, we encourage you to take your nausea medication AS DISCUSSED WITH DR Marko Plume   If you develop nausea and vomiting that is not controlled by your nausea medication, call the clinic.   BELOW ARE SYMPTOMS THAT SHOULD BE REPORTED IMMEDIATELY:  *FEVER GREATER THAN 100.5 F  *CHILLS WITH OR WITHOUT FEVER  NAUSEA AND VOMITING THAT IS NOT CONTROLLED WITH YOUR NAUSEA MEDICATION  *UNUSUAL SHORTNESS OF BREATH  *UNUSUAL BRUISING OR BLEEDING  TENDERNESS IN MOUTH AND THROAT WITH OR WITHOUT PRESENCE OF ULCERS  *URINARY PROBLEMS  *BOWEL PROBLEMS  UNUSUAL RASH Items with * indicate a potential emergency and should be followed up as soon as possible.  Feel free to call the clinic you have any questions or concerns. The clinic phone number is [phone].

## 2014-03-30 NOTE — Telephone Encounter (Signed)
Per staff message and POF I have scheduled appts.  JMW  

## 2014-03-30 NOTE — Progress Notes (Signed)
1120 pt lightheaded after chemo completed. Chair reclined and snack of crackers given. BP 108/65 pulse 76. Observation for 15 minutes. 1135 Pt asymptomatic. BP 120/76 pulse 80. Instructed to get up slowly and drink plenty of water.

## 2014-03-30 NOTE — Telephone Encounter (Signed)
, °

## 2014-03-31 ENCOUNTER — Ambulatory Visit (HOSPITAL_BASED_OUTPATIENT_CLINIC_OR_DEPARTMENT_OTHER): Payer: BC Managed Care – PPO

## 2014-03-31 VITALS — BP 137/71 | HR 89 | Temp 98.6°F | Resp 20

## 2014-03-31 DIAGNOSIS — C50212 Malignant neoplasm of upper-inner quadrant of left female breast: Secondary | ICD-10-CM

## 2014-03-31 DIAGNOSIS — Z5189 Encounter for other specified aftercare: Secondary | ICD-10-CM

## 2014-03-31 DIAGNOSIS — C50219 Malignant neoplasm of upper-inner quadrant of unspecified female breast: Secondary | ICD-10-CM

## 2014-03-31 MED ORDER — PEGFILGRASTIM INJECTION 6 MG/0.6ML
6.0000 mg | Freq: Once | SUBCUTANEOUS | Status: AC
  Administered 2014-03-31: 6 mg via SUBCUTANEOUS

## 2014-03-31 NOTE — Patient Instructions (Signed)

## 2014-03-31 NOTE — Progress Notes (Signed)
Pt here for injection. She had her first chemo treatment yesterday. She had a very good night. No nausea or vomiting. She ate a good dinner and breakfast. I explained the side effects of neulasta regarding bone pain and that she can take her regular pain medication if needed. She and her husband know to call if any problems or questions.

## 2014-04-04 ENCOUNTER — Other Ambulatory Visit: Payer: Self-pay | Admitting: Oncology

## 2014-04-04 ENCOUNTER — Ambulatory Visit: Payer: Medicare Other

## 2014-04-04 DIAGNOSIS — C50212 Malignant neoplasm of upper-inner quadrant of left female breast: Secondary | ICD-10-CM

## 2014-04-05 ENCOUNTER — Encounter: Payer: Self-pay | Admitting: Oncology

## 2014-04-05 ENCOUNTER — Ambulatory Visit (HOSPITAL_BASED_OUTPATIENT_CLINIC_OR_DEPARTMENT_OTHER): Payer: BC Managed Care – PPO | Admitting: Oncology

## 2014-04-05 ENCOUNTER — Telehealth: Payer: Self-pay | Admitting: Oncology

## 2014-04-05 ENCOUNTER — Other Ambulatory Visit (HOSPITAL_BASED_OUTPATIENT_CLINIC_OR_DEPARTMENT_OTHER): Payer: BC Managed Care – PPO

## 2014-04-05 ENCOUNTER — Ambulatory Visit (INDEPENDENT_AMBULATORY_CARE_PROVIDER_SITE_OTHER): Payer: BC Managed Care – PPO | Admitting: Surgery

## 2014-04-05 VITALS — BP 118/68 | HR 84 | Temp 98.4°F | Resp 20 | Ht 67.0 in | Wt 168.5 lb

## 2014-04-05 VITALS — BP 120/64 | HR 80 | Temp 98.0°F | Resp 18 | Ht 67.0 in | Wt 167.0 lb

## 2014-04-05 DIAGNOSIS — C50919 Malignant neoplasm of unspecified site of unspecified female breast: Secondary | ICD-10-CM

## 2014-04-05 DIAGNOSIS — C50219 Malignant neoplasm of upper-inner quadrant of unspecified female breast: Secondary | ICD-10-CM

## 2014-04-05 DIAGNOSIS — C50212 Malignant neoplasm of upper-inner quadrant of left female breast: Secondary | ICD-10-CM

## 2014-04-05 LAB — CBC WITH DIFFERENTIAL/PLATELET
BASO%: 0.3 % (ref 0.0–2.0)
Basophils Absolute: 0 10*3/uL (ref 0.0–0.1)
EOS%: 0.4 % (ref 0.0–7.0)
Eosinophils Absolute: 0 10*3/uL (ref 0.0–0.5)
HCT: 39.6 % (ref 34.8–46.6)
HGB: 13 g/dL (ref 11.6–15.9)
LYMPH#: 0.2 10*3/uL — AB (ref 0.9–3.3)
LYMPH%: 26 % (ref 14.0–49.7)
MCH: 31 pg (ref 25.1–34.0)
MCHC: 32.9 g/dL (ref 31.5–36.0)
MCV: 94.4 fL (ref 79.5–101.0)
MONO#: 0 10*3/uL — AB (ref 0.1–0.9)
MONO%: 3.2 % (ref 0.0–14.0)
NEUT#: 0.5 10*3/uL — CL (ref 1.5–6.5)
NEUT%: 70.1 % (ref 38.4–76.8)
Platelets: 81 10*3/uL — ABNORMAL LOW (ref 145–400)
RBC: 4.19 10*6/uL (ref 3.70–5.45)
RDW: 12.8 % (ref 11.2–14.5)
WBC: 0.8 10*3/uL — CL (ref 3.9–10.3)

## 2014-04-05 LAB — COMPREHENSIVE METABOLIC PANEL (CC13)
ALT: 36 U/L (ref 0–55)
ANION GAP: 10 meq/L (ref 3–11)
AST: 14 U/L (ref 5–34)
Albumin: 3.6 g/dL (ref 3.5–5.0)
Alkaline Phosphatase: 113 U/L (ref 40–150)
BUN: 18.9 mg/dL (ref 7.0–26.0)
CALCIUM: 9.3 mg/dL (ref 8.4–10.4)
CHLORIDE: 104 meq/L (ref 98–109)
CO2: 24 meq/L (ref 22–29)
Creatinine: 0.8 mg/dL (ref 0.6–1.1)
Glucose: 219 mg/dl — ABNORMAL HIGH (ref 70–140)
Potassium: 4.9 mEq/L (ref 3.5–5.1)
Sodium: 138 mEq/L (ref 136–145)
TOTAL PROTEIN: 6.8 g/dL (ref 6.4–8.3)
Total Bilirubin: 1.27 mg/dL — ABNORMAL HIGH (ref 0.20–1.20)

## 2014-04-05 MED ORDER — CIPROFLOXACIN HCL 250 MG PO TABS: 250.0000 mg | ORAL_TABLET | Freq: Two times a day (BID) | ORAL | Status: DC

## 2014-04-05 NOTE — Telephone Encounter (Signed)
, °

## 2014-04-05 NOTE — Progress Notes (Signed)
OFFICE PROGRESS NOTE   04/05/2014   Physicians:David Cristy Hilts, Karrar Husein, John Valene Bors   INTERVAL HISTORY:  Patient is seen, together with interpreter, in follow up of first dose dense AC given 03-30-14 with neulasta 03-31-14, for recently diagnosed T1N0 triple negative left breast cancer. She is neutropenic today, but not febrile, and otherwise has done better with most recent treatment than with prior chemotherapy in 2002. Particularly she has not had nausea and has not been fatigued, even mowing the yard this week.She has had slight sore throat in past 2 days, but no other symptoms of infection. PAC functioned well with treatment. Her husband returned to [place] 04-02-14.   She has PAC   ONCOLOGIC HISTORY History is of right breast cancer diagnosed January 2002 when patient was age 57 and premenopausal, T2N0M0 (4cm grade 3 invasive ductal carcinoma, <25% high grade DCIS, S phase 8.6%), ER negative, PR negative, HER 2 positive. She had lumpectomy with 2 negative sentinel nodes, adjuvant chemotherapy by Dr Starr Sinclair with adriamycin cytoxan x 4 cycles from March 2002 thru May 2002 (total adriamycin dose 409 mg with weight 142, height 5'7", for total adriamycin 235 mg/m2), and local radiation (50.4 Gy with boost to 60.4) Gy Note she declined PAC and received the chemotherapy in 2002 by peripheral vein. I followed her beginning Sept 2003, after Dr Pearlie Oyster left [place], until last previous visit 07-2011. She declined TEACH trial with lapatinib in 2007 and 2008. Last breast MRI previously was 08-2011.  Patient had infertility treatments at [facility] in 2005, daughter born 02-01-2005. Menses had been very infrequent after chemo until the infertility treatment, then stopped after that pregnancy. Patient had bilateral mammograms at Dr [facility] office early 12-2013, with area questioned on left compared with last prior mammograms of 08-2012. She had diagnostic left mammogram  and [place] at [facility] on 01-01-14 which showed a mass in left upper inner breast posteriorly 1.5 x 1.4 x 1.3 cm on [place], with no abnormal left axillary nodes identified. She had biopsy on 01-01-14 with pathology ([id number]) showing grade 1-2 invasive ductal carcinoma ER, PR and HER 2 negative with Ki 66%. She had breast MRI 01-15-2014 with solitary enhancing mass upper inner left breast 1.7 x 2.0 x 1.8 cm, right breast not remarkable, no abnormal appearing lymph nodes. She had CT CAP 01-29-14 then MRI liver 02-05-14 with no evidence of metastatic disease. Genetics testing with Breast/Ovarian Cancer Gene Panel [phone] did not reveal a pathogenic mutation in these genes, tho she did have 3 variants of uncertain significance (ATM, c.[id number]; ATM, c.8674G>A and PMS2, C.1309C>T). She had consultation with Dr Tammi Klippel prior to definitive surgery. She had lumpectomy and left axillary sentinel node biopsy by Dr Lucia Gaskins on 03-06-2014. Pathology [phone]) found 1.8 cm grade II invasive ductal carcinoma with one negative sentinel node and final margins negative. She had PAC placed by Dr Lucia Gaskins.  Chemotherapy began with dose dense AC on 03-30-14, planning 2 cycles of AC due to prior adriamycin treatment, then 4 cycles of taxotere cytoxan.     Review of systems as above, also: Has been able to sleep. No SOB, bowels moving, no bleeding. She did not have aching after neulasta injection. Remainder of 10 point Review of Systems negative.  Objective:  Vital signs in last 24 hours:  BP 118/68  Pulse 84  Temp(Src) 98.4 F (36.9 C) (Oral)  Resp 20  Ht _0  (1.702 m)  Wt 168 lb 8 oz (76.431 kg)  BMI 26.38 kg/m2  weight is down 1 lb  Alert, oriented and appropriate. Easily mobile, looks comfortable  No alopecia  HEENT:PERRL, sclerae not icteric. Oral mucosa moist without lesions, posterior pharynx with slight patchy erythema, no exudate, no thrush.  Neck supple. No JVD.  Lymphatics:no cervical,suraclavicular,  axillary or inguinal adenopathy Resp: clear to auscultation bilaterally and normal percussion bilaterally Cardio: regular rate and rhythm. No gallop. GI: soft, nontender, not distended, no mass or organomegaly. Normally active bowel sounds. Surgical incision not remarkable.Musculoskeletal/ Extremities: without pitting edema, cords, tenderness Neuro: congenital deafness Skin without rash, ecchymosis, petechiae Breasts: Left lumpectomy incision healing well, otherwise without dominant mass, skin or nipple findings bilaterally. Axillae benign. Portacath-without erythema or tenderness  Lab Results:  Results for orders placed in visit on 04/05/14  CBC WITH DIFFERENTIAL      Result Value Ref Range   WBC 0.8 Repeated and Verified (*) 3.9 - 10.3 10e3/uL   NEUT# 0.5 Repeated and Verified (*) 1.5 - 6.5 10e3/uL   HGB 13.0  11.6 - 15.9 g/dL   HCT 39.6  34.8 - 46.6 %   Platelets 81 repeated and verified (*) 145 - 400 10e3/uL   MCV 94.4  79.5 - 101.0 fL   MCH 31.0  25.1 - 34.0 pg   MCHC 32.9  31.5 - 36.0 g/dL   RBC 4.19  3.70 - 5.45 10e6/uL   RDW 12.8  11.2 - 14.5 %   lymph# 0.2 (*) 0.9 - 3.3 10e3/uL   MONO# 0.0 (*) 0.1 - 0.9 10e3/uL   Eosinophils Absolute 0.0  0.0 - 0.5 10e3/uL   Basophils Absolute 0.0  0.0 - 0.1 10e3/uL   NEUT% 70.1  38.4 - 76.8 %   LYMPH% 26.0  14.0 - 49.7 %   MONO% 3.2  0.0 - 14.0 %   EOS% 0.4  0.0 - 7.0 %   BASO% 0.3  0.0 - 2.0 %  COMPREHENSIVE METABOLIC PANEL (HX50)      Result Value Ref Range   Sodium 138  136 - 145 mEq/L   Potassium 4.9  3.5 - 5.1 mEq/L   Chloride 104  98 - 109 mEq/L   CO2 24  22 - 29 mEq/L   Glucose 219 (*) 70 - 140 mg/dl   BUN 18.9  7.0 - 26.0 mg/dL   Creatinine 0.8  0.6 - 1.1 mg/dL   Total Bilirubin 1.27 (*) 0.20 - 1.20 mg/dL   Alkaline Phosphatase 113  40 - 150 U/L   AST 14  5 - 34 U/L   ALT 36  0 - 55 U/L   Total Protein 6.8  6.4 - 8.3 g/dL   Albumin 3.6  3.5 - 5.0 g/dL   Calcium 9.3  8.4 - 10.4 mg/dL   Anion Gap 10  3 - 11 mEq/L      Studies/Results:  No results found.  Medications: I have reviewed the patient's current medications. Neulasta was given on 03-31-14. Will begin Cipro 250 mg bid until we recheck CBC on 04-09-14.  DISCUSSION: with assistance of interpreter, I have explained present neutropenia and thrombocytopenia, increased risk of infection and bleeding, neutropenic precautions and importance of contacting this office immediately for temp >=100.5 or symptoms of infection. She understood all of this information  Assessment/Plan: 1. T1cN0 triple negative invasive ductal left breast cancer: post lumpectomy and sentinel node evaluation and first cycle of dose dense AC given 03-30-14 with neulasta 03-31-14. She is neutropenic and thrombocytopenic today. Neutropenic and bleeding precautions, cipro and repeat counts at visit  04-09-14. Cycle 2 dose dense AC due 04-13-14. 2.history of T2N0 ER PR negative HER 2 + right breast cancer in 2002 at age 89 and premenopausal: treatment as above including 4 cycles of AC for total adriamycin dose calculated now at 235 mg/m2. That adjuvant treatment did not include herceptin in 2002 and subsequently patient declined TEACH trial. No findings of concern on right by mammogram or MRI now  3.PAC placed at left breast surgery  4.negative colonoscopy ~ Jan 2015  5.post menopausal since ~ age 56  6.congenital deafness. Needs sign language interpreter with all visits.        Gordy Levan, MD   04/05/2014, 3:03 PM

## 2014-04-05 NOTE — Progress Notes (Signed)
Re:   Regina KITT DOB:   1961-03-08 MRN:   [id number]  ASSESSMENT AND PLAN: 1.  Left breast cancer (T1, N0), approximately 1.5 cm.  At 10 o'clock.  Final path - 03/06/2104 (Accession: [id number]) - IDC, 0% - ER, 0% - PR, Ki67- 66%, Her2Neu - neg  She sees Dr. Marko Plume, saw Dr. Tammi Klippel.  Genetic testing negative from [facility] on 02/20/2014.    Left lumpectomy/left axillary SLNBx - 03/06/2014  On the first of 6 chemotx treatments.  She'll see me back in 6 months.    2.  History of right breast cancer  Right breast lumpectomy and right axillary sentinel lymph node biopsy - 12/08/2000 - D.   Invasive ductal carcinoma -T2, N0 - 4 cm cancer, 0/2 nodes, ER/PR - neg, Her2neu - positive  She had chemotx (adriamycin and cytoxan) by Dr. Salomon Mast, then was followed by Dr. Godfrey Pick.  Had RTx by Dr. Gari Crown.  She has been disease free on the right side and has been pleased with the results.  3.  Congenital deafness  Sherrilee Gilles provided sign language  Chief Complaint  Patient presents with  . Routine Post Op    left lumpectomy   REFERRING PHYSICIAN: Wenda Low, MD  HISTORY OF PRESENT ILLNESS: Regina Watts is a 53 y.o. (DOB: 1961/08/12)  white  female whose primary care physician is HUSAIN,KARRAR, MD and comes to me today for left breast cancer. She is doing well. She has had 1/6 chemo tx. Her only complaint is the glue on left breast.  Breast Cancer History: [name] is married, but her husband lives in [place].  It seems that they get together maybe once a month.  I am not sure that I have ever met her husband.  Regina Watts is a former breast cancer patient of mine.  Her original diagnosis was 2002. She had a T2, N0 right breast cancer treated with lumpectomy and SLNBx, chemotx supervised by Dr. Salomon Mast, and radiation therapy supervised by Dr. Gari Crown.  Her right breast cancer was ER/PR neg, but Her2Neu positive.  She received adriamycin and cytoxan.  I am guessing this is just  before Herceptin became generally available. I last saw Regina Watts in June 2011.  [facility]. She has a 1.5 cm lesion at 10 o'clock on mammogram and [place].  She underwent a US guided biopsy the same day.   Past Medical History  Diagnosis Date  . Congenital deafness   . Hyperlipidemia   . Breast cancer JANUARY 2002    RIGHT BREAST T4 SENTINEL NODE NEGATIVE, HER2 3+, ER/PR NEGATIVE  . Breast cancer February 2015    Left Breast T1N0  . Hx of radiation therapy   . Hx antineoplastic chemo   . Wears glasses     to drive     Past Surgical History  Procedure Laterality Date  . Tonsillectomy    . Breast surgery  2002    lumpectomy on right side  . Cesarean section    . Breast lumpectomy with needle localization and axillary sentinel lymph node bx Left 03/06/2014    Procedure: BREAST LUMPECTOMY WITH NEEDLE LOCALIZATION AND AXILLARY SENTINEL LYMPH NODE BIOPSY;  Surgeon: Shann Medal, MD;  Location: [place];  Service: General;  Laterality: Left;  . Portacath placement Right 03/06/2014    Procedure: INSERTION PORT-A-CATH;  Surgeon: Shann Medal, MD;  Location: [place];  Service:  General;  Laterality: Right;  subclavian      Current Outpatient Prescriptions  Medication Sig Dispense Refill  . dexamethasone (DECADRON) 4 MG tablet Take 2 tablets by mouth the day after chemotherapy; then take 2 tablets two times a day for 2 days. Take with food.  10 tablet  1  . lidocaine-prilocaine (EMLA) cream Apply to PortaCath  1-2 hrs prior to access as directed.  30 g  1  . LORazepam (ATIVAN) 0.5 MG tablet Place 1-2 tabs under tongue or swallow every 6 hrs as needed for nausea.  Take night of chemo whether or not any nausea.  Will make Drowsy  30 tablet  0  . ondansetron (ZOFRAN) 8 MG tablet Take 1 tablet (8 mg total) by mouth 2 (two) times daily as needed. Start on the third day after chemotherapy. Will not make Drowsy  30  tablet  1  . prochlorperazine (COMPAZINE) 10 MG tablet Take 1 tablet (10 mg total) by mouth every 6 (six) hours as needed for nausea (Will make drowsy).  30 tablet  1  . rosuvastatin (CRESTOR) 10 MG tablet Take 10 mg by mouth daily.      . sertraline (ZOLOFT) 100 MG tablet Take 100 mg by mouth daily.      Marland Kitchen HYDROcodone-acetaminophen (NORCO/VICODIN) 5-325 MG per tablet Take 1-2 tablets by mouth every 6 (six) hours as needed.  30 tablet  0   No current facility-administered medications for this visit.     No Known Allergies  REVIEW OF SYSTEMS: Neurologic:  Congenital deafness.  SOCIAL and FAMILY HISTORY: Married.  Though odd story that her husband lives in [place]. She has one daughter, Regina Watts, who is 65 (born 2006). She is a stay at home mom. Ellamae is accompanied by Janetta Hora who provided sign language.  PHYSICAL EXAM: BP 120/64  Pulse 80  Temp(Src) 98 F (36.7 C)  Resp 18  Ht 5' 7" (1.702 m)  Wt 167 lb (75.751 kg)  BMI 26.15 kg/m2  General: WN WF who is alert and generally healthy appearing. He communication is through the person signing.  HEENT: Normal. Pupils equal.  Breasts: Right - scar at 12 o'clock. Somewhat thickened. Som pigmentation of right breast.   Left - UOQ incision and left axillary incision - okay.  DATA REVIEWED: Path to patient.  Alphonsa Overall, MD,  [facility], [address]  [place], [place]    [place] Phone:  [phone] FAX:  [phone]

## 2014-04-06 ENCOUNTER — Telehealth: Payer: Self-pay | Admitting: *Deleted

## 2014-04-06 NOTE — Telephone Encounter (Signed)
Per staff message and POF I have adjusted 6/5 apt

## 2014-04-07 ENCOUNTER — Ambulatory Visit: Payer: Medicare Other

## 2014-04-08 ENCOUNTER — Other Ambulatory Visit: Payer: Self-pay | Admitting: Oncology

## 2014-04-08 DIAGNOSIS — C50212 Malignant neoplasm of upper-inner quadrant of left female breast: Secondary | ICD-10-CM

## 2014-04-09 ENCOUNTER — Ambulatory Visit (HOSPITAL_BASED_OUTPATIENT_CLINIC_OR_DEPARTMENT_OTHER): Payer: BC Managed Care – PPO | Admitting: Oncology

## 2014-04-09 ENCOUNTER — Encounter: Payer: Self-pay | Admitting: Oncology

## 2014-04-09 ENCOUNTER — Telehealth: Payer: Self-pay | Admitting: Oncology

## 2014-04-09 ENCOUNTER — Other Ambulatory Visit (HOSPITAL_BASED_OUTPATIENT_CLINIC_OR_DEPARTMENT_OTHER): Payer: BC Managed Care – PPO

## 2014-04-09 ENCOUNTER — Telehealth: Payer: Self-pay | Admitting: *Deleted

## 2014-04-09 VITALS — BP 113/73 | HR 80 | Temp 98.6°F | Resp 18 | Ht 67.0 in | Wt 170.3 lb

## 2014-04-09 DIAGNOSIS — C50912 Malignant neoplasm of unspecified site of left female breast: Secondary | ICD-10-CM

## 2014-04-09 DIAGNOSIS — Z171 Estrogen receptor negative status [ER-]: Secondary | ICD-10-CM | POA: Diagnosis not present

## 2014-04-09 DIAGNOSIS — K1230 Oral mucositis (ulcerative), unspecified: Secondary | ICD-10-CM

## 2014-04-09 DIAGNOSIS — C50219 Malignant neoplasm of upper-inner quadrant of unspecified female breast: Secondary | ICD-10-CM

## 2014-04-09 DIAGNOSIS — K121 Other forms of stomatitis: Secondary | ICD-10-CM

## 2014-04-09 DIAGNOSIS — C50212 Malignant neoplasm of upper-inner quadrant of left female breast: Secondary | ICD-10-CM

## 2014-04-09 LAB — CBC WITH DIFFERENTIAL/PLATELET
BASO%: 1.4 % (ref 0.0–2.0)
Basophils Absolute: 0.1 10*3/uL (ref 0.0–0.1)
EOS%: 1.6 % (ref 0.0–7.0)
Eosinophils Absolute: 0.1 10*3/uL (ref 0.0–0.5)
HCT: 36.8 % (ref 34.8–46.6)
HGB: 12.2 g/dL (ref 11.6–15.9)
LYMPH%: 21.6 % (ref 14.0–49.7)
MCH: 31.2 pg (ref 25.1–34.0)
MCHC: 33.3 g/dL (ref 31.5–36.0)
MCV: 93.8 fL (ref 79.5–101.0)
MONO#: 0.3 10*3/uL (ref 0.1–0.9)
MONO%: 5.8 % (ref 0.0–14.0)
NEUT%: 69.6 % (ref 38.4–76.8)
NEUTROS ABS: 3.2 10*3/uL (ref 1.5–6.5)
Platelets: 83 10*3/uL — ABNORMAL LOW (ref 145–400)
RBC: 3.92 10*6/uL (ref 3.70–5.45)
RDW: 12.4 % (ref 11.2–14.5)
WBC: 4.6 10*3/uL (ref 3.9–10.3)
lymph#: 1 10*3/uL (ref 0.9–3.3)

## 2014-04-09 LAB — COMPREHENSIVE METABOLIC PANEL (CC13)
ALBUMIN: 3.4 g/dL — AB (ref 3.5–5.0)
ALK PHOS: 103 U/L (ref 40–150)
ALT: 52 U/L (ref 0–55)
AST: 23 U/L (ref 5–34)
Anion Gap: 11 mEq/L (ref 3–11)
BUN: 13.1 mg/dL (ref 7.0–26.0)
CO2: 23 mEq/L (ref 22–29)
Calcium: 9.2 mg/dL (ref 8.4–10.4)
Chloride: 107 mEq/L (ref 98–109)
Creatinine: 0.7 mg/dL (ref 0.6–1.1)
GLUCOSE: 101 mg/dL (ref 70–140)
POTASSIUM: 4.5 meq/L (ref 3.5–5.1)
SODIUM: 140 meq/L (ref 136–145)
TOTAL PROTEIN: 6.4 g/dL (ref 6.4–8.3)
Total Bilirubin: 0.28 mg/dL (ref 0.20–1.20)

## 2014-04-09 MED ORDER — MAGIC MOUTHWASH W/LIDOCAINE: ORAL | Status: DC

## 2014-04-09 NOTE — Patient Instructions (Signed)
We will check your blood counts on Friday 6-5 before you get chemo, to be sure they have come up well enough for chemo that day. If you get chemo on 6-5, you will get the white blood cell shot on 6-6 (Saturday). You can use ice chips or popsicles during the adriamycin chemotherapy, which should help with mouth and throat soreness next time. The soreness is mucositis from that chemo.

## 2014-04-09 NOTE — Progress Notes (Signed)
OFFICE PROGRESS NOTE   04/09/2014   Physicians:David Cristy Hilts, Karrar Husein, John McComb, Earle Gell   INTERVAL HISTORY:  Patient is seen, together with sign language interpreter, now having had first dose dense AC on 03-30-14 for recently diagnosed T1N0 triple negative left breast cancer. She had neulasta on 03-31-14, was neutropenic with ANC 0.5 on day 7 but had no fever and WBC has recovered today. She has had mild oral and esophageal mucositis symptoms which appear related to the adriamycin, but no nausea, no bleeding and no noticeable fatigue. Her scalp itches, likely symptom of anticipated hair loss.  She has PAC   Husband has been back in [place] with work this past week, but will return to [facility] for chemo due 04-13-14.   ONCOLOGIC HISTORY History is of right breast cancer diagnosed January 2002 when patient was age 73 and premenopausal, T2N0M0 (4cm grade 3 invasive ductal carcinoma, <25% high grade DCIS, S phase 8.6%), ER negative, PR negative, HER 2 positive. She had lumpectomy with 2 negative sentinel nodes, adjuvant chemotherapy by Dr Starr Sinclair with adriamycin cytoxan x 4 cycles from March 2002 thru May 2002 (total adriamycin dose 409 mg with weight 142, height 5'7", for total adriamycin 235 mg/m2), and local radiation (50.4 Gy with boost to 60.4) Gy Note she declined PAC and received the chemotherapy in 2002 by peripheral vein. I followed her beginning Sept 2003, after Dr Pearlie Oyster left [place], until last previous visit 07-2011. She declined TEACH trial with lapatinib in 2007 and 2008. Last breast MRI previously was 08-2011.  Patient had infertility treatments at [facility] in 2005, daughter born 02-01-2005. Menses had been very infrequent after chemo until the infertility treatment, then stopped after that pregnancy.   Patient had bilateral mammograms at Dr [facility] office early 12-2013, with area questioned on left compared with last prior mammograms of 08-2012. She  had diagnostic left mammogram and [place] at [facility] on 01-01-14 which showed a mass in left upper inner breast posteriorly 1.5 x 1.4 x 1.3 cm on [place], with no abnormal left axillary nodes identified. She had biopsy on 01-01-14 with pathology ([id number]) showing grade 1-2 invasive ductal carcinoma ER, PR and HER 2 negative with Ki 66%. She had breast MRI 01-15-2014 with solitary enhancing mass upper inner left breast 1.7 x 2.0 x 1.8 cm, right breast not remarkable, no abnormal appearing lymph nodes. She had CT CAP 01-29-14 then MRI liver 02-05-14 with no evidence of metastatic disease. Genetics testing with Breast/Ovarian Cancer Gene Panel [phone] did not reveal a pathogenic mutation in these genes, tho she did have 3 variants of uncertain significance (ATM, c.3874_3885del12; ATM, c.8674G>A and PMS2, C.1309C>T). She had consultation with Dr Tammi Klippel prior to definitive surgery. She had lumpectomy and left axillary sentinel node biopsy by Dr Lucia Gaskins on 03-06-2014. Pathology [id number]) found 1.8 cm grade II invasive ductal carcinoma with one negative sentinel node and final margins negative. She had PAC placed by Dr Lucia Gaskins.  Chemotherapy began with dose dense AC on 03-30-14, planning 2 cycles of AC due to prior adriamycin treatment, then 4 cycles of taxotere cytoxan.      Review of systems as above, also: No SOB, no chest discomfort, no problems with PAC. Energy good. Bowels moving. Remainder of 10 point Review of Systems negative.  Objective:  Vital signs in last 24 hours:  BP 113/73  Pulse 80  Temp(Src) 98.6 F (37 C) (Oral)  Resp 18  Ht 5' 7" (1.702 m)  Wt 170 lb 4.8 oz (  77.248 kg)  BMI 26.67 kg/m2  Alert, oriented and appropriate. Ambulatory without difficulty. No allopecia  HEENT:PERRL, sclerae not icteric. Oral mucosa moist without lesions, posterior pharynx with patchy erythema, no thrush, no exudate.. Scalp not remarkable. Neck supple. No JVD.  Lymphatics:no cervical,suraclavicular, axillary  or inguinal adenopathy Resp: clear to auscultation bilaterally and normal percussion bilaterally Cardio: regular rate and rhythm. No gallop. GI: soft, nontender, not distended, no mass or organomegaly. Normally active bowel sounds. Musculoskeletal/ Extremities: without pitting edema, cords, tenderness Neuro: no peripheral neuropathy. Otherwise nonfocal Skin without rash, ecchymosis, petechiae Breasts: left lumpectomy scar healing, otherwise without dominant mass, skin or nipple findings. Axillae benign. Portacath-without erythema or tenderness  Lab Results:  Results for orders placed in visit on 04/09/14  CBC WITH DIFFERENTIAL      Result Value Ref Range   WBC 4.6  3.9 - 10.3 10e3/uL   NEUT# 3.2  1.5 - 6.5 10e3/uL   HGB 12.2  11.6 - 15.9 g/dL   HCT 36.8  34.8 - 46.6 %   Platelets 83 (*) 145 - 400 10e3/uL   MCV 93.8  79.5 - 101.0 fL   MCH 31.2  25.1 - 34.0 pg   MCHC 33.3  31.5 - 36.0 g/dL   RBC 3.92  3.70 - 5.45 10e6/uL   RDW 12.4  11.2 - 14.5 %   lymph# 1.0  0.9 - 3.3 10e3/uL   MONO# 0.3  0.1 - 0.9 10e3/uL   Eosinophils Absolute 0.1  0.0 - 0.5 10e3/uL   Basophils Absolute 0.1  0.0 - 0.1 10e3/uL   NEUT% 69.6  38.4 - 76.8 %   LYMPH% 21.6  14.0 - 49.7 %   MONO% 5.8  0.0 - 14.0 %   EOS% 1.6  0.0 - 7.0 %   BASO% 1.4  0.0 - 2.0 %  COMPREHENSIVE METABOLIC PANEL (ST41)      Result Value Ref Range   Sodium 140  136 - 145 mEq/L   Potassium 4.5  3.5 - 5.1 mEq/L   Chloride 107  98 - 109 mEq/L   CO2 23  22 - 29 mEq/L   Glucose 101  70 - 140 mg/dl   BUN 13.1  7.0 - 26.0 mg/dL   Creatinine 0.7  0.6 - 1.1 mg/dL   Total Bilirubin 0.28  0.20 - 1.20 mg/dL   Alkaline Phosphatase 103  40 - 150 U/L   AST 23  5 - 34 U/L   ALT 52  0 - 55 U/L   Total Protein 6.4  6.4 - 8.3 g/dL   Albumin 3.4 (*) 3.5 - 5.0 g/dL   Calcium 9.2  8.4 - 10.4 mg/dL   Anion Gap 11  3 - 11 mEq/L     Studies/Results:  No results found.  Medications: I have reviewed the patient's current medications. Will try  magic mouthwash prn. She has been on prophylactic cipro since neutropenic on 5-28, will stop that now with Post recovered.  DISCUSSION: will try ice or popsicles x 15 min pre/post/during adria to lessen mucositis with cycle 2  Assessment/Plan:  . T1cN0 triple negative invasive ductal left breast cancer: post lumpectomy and sentinel node evaluation and first cycle of dose dense AC given 03-30-14 with neulasta 03-31-14. She was neutropenic by day 7, ANC up without complications today tho still mildly thrombocytopenic. Cycle 2 dose dense AC due 04-13-14 as long as ANC >=1.5 and plt >=100k; she willl have neulasta on day 2. I will see her 6-10 (day 6)  and she will need cipro coverage then if ANC low. We will stop dose dense AC after this second cycle due to previous use of that drug, and will continue with 4 cycles of taxotere cytoxan q 3 weeks. She has had teaching for taxotere already. 2.history of T2N0 ER PR negative HER 2 + right breast cancer in 2002 at age 51 and premenopausal: treatment as above including 4 cycles of AC for total adriamycin dose calculated now at 235 mg/m2. That adjuvant treatment did not include herceptin in 2002 and subsequently patient declined TEACH trial. No findings of concern on right by mammogram or MRI now  3.PAC placed at left breast surgery  4.negative colonoscopy ~ Jan 2015  5.post menopausal since ~ age 49. She has not had bone density scan at least listed in this EMR 6.congenital deafness. Needs sign language interpreter with all visits.    Patient understood all of discussion and has had questions answered to her satisfaction. 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    Gordy Levan, MD   04/09/2014, 12:56 PM

## 2014-04-09 NOTE — Telephone Encounter (Signed)
Per staff message and POF I have scheduled appts.  JMW  

## 2014-04-13 ENCOUNTER — Ambulatory Visit (HOSPITAL_BASED_OUTPATIENT_CLINIC_OR_DEPARTMENT_OTHER): Payer: BC Managed Care – PPO

## 2014-04-13 ENCOUNTER — Other Ambulatory Visit (HOSPITAL_BASED_OUTPATIENT_CLINIC_OR_DEPARTMENT_OTHER): Payer: BC Managed Care – PPO

## 2014-04-13 VITALS — BP 117/76 | HR 82 | Temp 98.7°F | Resp 17

## 2014-04-13 DIAGNOSIS — C50212 Malignant neoplasm of upper-inner quadrant of left female breast: Secondary | ICD-10-CM

## 2014-04-13 DIAGNOSIS — Z5111 Encounter for antineoplastic chemotherapy: Secondary | ICD-10-CM | POA: Diagnosis not present

## 2014-04-13 DIAGNOSIS — C50219 Malignant neoplasm of upper-inner quadrant of unspecified female breast: Secondary | ICD-10-CM

## 2014-04-13 LAB — CBC WITH DIFFERENTIAL/PLATELET
BASO%: 0.7 % (ref 0.0–2.0)
Basophils Absolute: 0 10*3/uL (ref 0.0–0.1)
EOS%: 0.3 % (ref 0.0–7.0)
Eosinophils Absolute: 0 10*3/uL (ref 0.0–0.5)
HCT: 35.8 % (ref 34.8–46.6)
HGB: 12 g/dL (ref 11.6–15.9)
LYMPH%: 15.1 % (ref 14.0–49.7)
MCH: 30.7 pg (ref 25.1–34.0)
MCHC: 33.5 g/dL (ref 31.5–36.0)
MCV: 91.6 fL (ref 79.5–101.0)
MONO#: 0.5 10*3/uL (ref 0.1–0.9)
MONO%: 8.2 % (ref 0.0–14.0)
NEUT#: 4.5 10*3/uL (ref 1.5–6.5)
NEUT%: 75.7 % (ref 38.4–76.8)
Platelets: 131 10*3/uL — ABNORMAL LOW (ref 145–400)
RBC: 3.91 10*6/uL (ref 3.70–5.45)
RDW: 12.5 % (ref 11.2–14.5)
WBC: 5.9 10*3/uL (ref 3.9–10.3)
lymph#: 0.9 10*3/uL (ref 0.9–3.3)

## 2014-04-13 MED ORDER — SODIUM CHLORIDE 0.9 % IV SOLN
600.0000 mg/m2 | Freq: Once | INTRAVENOUS | Status: AC
  Administered 2014-04-13: 1140 mg via INTRAVENOUS
  Filled 2014-04-13: qty 57

## 2014-04-13 MED ORDER — DEXAMETHASONE SODIUM PHOSPHATE 20 MG/5ML IJ SOLN
INTRAMUSCULAR | Status: AC
  Filled 2014-04-13: qty 5

## 2014-04-13 MED ORDER — SODIUM CHLORIDE 0.9 % IV SOLN
Freq: Once | INTRAVENOUS | Status: AC
  Administered 2014-04-13: 11:00:00 via INTRAVENOUS

## 2014-04-13 MED ORDER — PALONOSETRON HCL INJECTION 0.25 MG/5ML
INTRAVENOUS | Status: AC
  Filled 2014-04-13: qty 5

## 2014-04-13 MED ORDER — HEPARIN SOD (PORK) LOCK FLUSH 100 UNIT/ML IV SOLN
500.0000 [IU] | Freq: Once | INTRAVENOUS | Status: AC | PRN
Start: 2014-04-13 — End: 2014-04-13
  Administered 2014-04-13: 500 [IU]
  Filled 2014-04-13: qty 5

## 2014-04-13 MED ORDER — DOXORUBICIN HCL CHEMO IV INJECTION 2 MG/ML
60.0000 mg/m2 | Freq: Once | INTRAVENOUS | Status: AC
  Administered 2014-04-13: 114 mg via INTRAVENOUS
  Filled 2014-04-13: qty 57

## 2014-04-13 MED ORDER — DEXAMETHASONE SODIUM PHOSPHATE 20 MG/5ML IJ SOLN
12.0000 mg | Freq: Once | INTRAMUSCULAR | Status: AC
  Administered 2014-04-13: 12 mg via INTRAVENOUS

## 2014-04-13 MED ORDER — SODIUM CHLORIDE 0.9 % IV SOLN
150.0000 mg | Freq: Once | INTRAVENOUS | Status: AC
  Administered 2014-04-13: 150 mg via INTRAVENOUS
  Filled 2014-04-13: qty 5

## 2014-04-13 MED ORDER — PALONOSETRON HCL INJECTION 0.25 MG/5ML
0.2500 mg | Freq: Once | INTRAVENOUS | Status: AC
  Administered 2014-04-13: 0.25 mg via INTRAVENOUS

## 2014-04-13 MED ORDER — SODIUM CHLORIDE 0.9 % IJ SOLN
10.0000 mL | INTRAMUSCULAR | Status: DC | PRN
  Administered 2014-04-13: 10 mL
  Filled 2014-04-13: qty 10

## 2014-04-13 NOTE — Patient Instructions (Signed)
[facility] Discharge Instructions for Patients Receiving Chemotherapy  Today you received the following chemotherapy agents: Adriamycin, Cytoxan   To help prevent nausea and vomiting after your treatment, we encourage you to take your nausea medication: Compazine 10 mg every 6 hrs as needed.    If you develop nausea and vomiting that is not controlled by your nausea medication, call the clinic.   BELOW ARE SYMPTOMS THAT SHOULD BE REPORTED IMMEDIATELY:  *FEVER GREATER THAN 100.5 F  *CHILLS WITH OR WITHOUT FEVER  NAUSEA AND VOMITING THAT IS NOT CONTROLLED WITH YOUR NAUSEA MEDICATION  *UNUSUAL SHORTNESS OF BREATH  *UNUSUAL BRUISING OR BLEEDING  TENDERNESS IN MOUTH AND THROAT WITH OR WITHOUT PRESENCE OF ULCERS  *URINARY PROBLEMS  *BOWEL PROBLEMS  UNUSUAL RASH Items with * indicate a potential emergency and should be followed up as soon as possible.  Feel free to call the clinic you have any questions or concerns. The clinic phone number is [phone].

## 2014-04-14 ENCOUNTER — Ambulatory Visit (HOSPITAL_BASED_OUTPATIENT_CLINIC_OR_DEPARTMENT_OTHER): Payer: BC Managed Care – PPO

## 2014-04-14 ENCOUNTER — Ambulatory Visit: Payer: Medicare Other

## 2014-04-14 VITALS — BP 110/63 | HR 87 | Temp 98.4°F

## 2014-04-14 DIAGNOSIS — Z5189 Encounter for other specified aftercare: Secondary | ICD-10-CM

## 2014-04-14 DIAGNOSIS — C50219 Malignant neoplasm of upper-inner quadrant of unspecified female breast: Secondary | ICD-10-CM

## 2014-04-14 DIAGNOSIS — C50212 Malignant neoplasm of upper-inner quadrant of left female breast: Secondary | ICD-10-CM

## 2014-04-14 MED ORDER — PEGFILGRASTIM INJECTION 6 MG/0.6ML
6.0000 mg | Freq: Once | SUBCUTANEOUS | Status: AC
  Administered 2014-04-14: 6 mg via SUBCUTANEOUS

## 2014-04-14 NOTE — Patient Instructions (Signed)

## 2014-04-15 ENCOUNTER — Other Ambulatory Visit: Payer: Self-pay | Admitting: Oncology

## 2014-04-15 DIAGNOSIS — C50212 Malignant neoplasm of upper-inner quadrant of left female breast: Secondary | ICD-10-CM

## 2014-04-18 ENCOUNTER — Other Ambulatory Visit (HOSPITAL_BASED_OUTPATIENT_CLINIC_OR_DEPARTMENT_OTHER): Payer: BC Managed Care – PPO

## 2014-04-18 ENCOUNTER — Telehealth: Payer: Self-pay

## 2014-04-18 ENCOUNTER — Other Ambulatory Visit: Payer: Medicare Other

## 2014-04-18 ENCOUNTER — Ambulatory Visit: Payer: Medicare Other | Admitting: Oncology

## 2014-04-18 DIAGNOSIS — C50219 Malignant neoplasm of upper-inner quadrant of unspecified female breast: Secondary | ICD-10-CM

## 2014-04-18 DIAGNOSIS — C50212 Malignant neoplasm of upper-inner quadrant of left female breast: Secondary | ICD-10-CM

## 2014-04-18 LAB — CBC WITH DIFFERENTIAL/PLATELET
BASO%: 0.9 % (ref 0.0–2.0)
Basophils Absolute: 0 10*3/uL (ref 0.0–0.1)
EOS%: 0.7 % (ref 0.0–7.0)
Eosinophils Absolute: 0 10*3/uL (ref 0.0–0.5)
HEMATOCRIT: 36.5 % (ref 34.8–46.6)
HGB: 12.2 g/dL (ref 11.6–15.9)
LYMPH%: 21.5 % (ref 14.0–49.7)
MCH: 31 pg (ref 25.1–34.0)
MCHC: 33.3 g/dL (ref 31.5–36.0)
MCV: 93.2 fL (ref 79.5–101.0)
MONO#: 0 10*3/uL — ABNORMAL LOW (ref 0.1–0.9)
MONO%: 2 % (ref 0.0–14.0)
NEUT#: 1.6 10*3/uL (ref 1.5–6.5)
NEUT%: 74.9 % (ref 38.4–76.8)
Platelets: 108 10*3/uL — ABNORMAL LOW (ref 145–400)
RBC: 3.92 10*6/uL (ref 3.70–5.45)
RDW: 12.7 % (ref 11.2–14.5)
WBC: 2.2 10*3/uL — ABNORMAL LOW (ref 3.9–10.3)
lymph#: 0.5 10*3/uL — ABNORMAL LOW (ref 0.9–3.3)

## 2014-04-18 NOTE — Telephone Encounter (Signed)
L/M in home voice mail to call regarding cancelled appointment today.  Will need for her to come in and check CBC/diff todayto see if neutropenic from treatment on 04-13-14.

## 2014-04-18 NOTE — Progress Notes (Signed)
Patient returned call via interpreter assistance and after explaining as to need for CBC today she agrees to lab work at 12:00. Since she is not seeing the physician, she will not have an interpreter with her.

## 2014-04-19 ENCOUNTER — Telehealth: Payer: Self-pay | Admitting: *Deleted

## 2014-04-19 ENCOUNTER — Other Ambulatory Visit: Payer: Self-pay | Admitting: *Deleted

## 2014-04-19 MED ORDER — CIPROFLOXACIN HCL 250 MG PO TABS: 250.0000 mg | ORAL_TABLET | Freq: Two times a day (BID) | ORAL | Status: DC

## 2014-04-19 NOTE — Telephone Encounter (Signed)
Left message to call.

## 2014-04-19 NOTE — Telephone Encounter (Signed)
INSTRUCTED Regina Watts. TO START CIPRO 250MG  BID X 3 DAYS. Regina Watts. HAS MEDICATION AT HOME AND WILL START IT. ALSO INSTRUCTED Regina Watts. TO CALL FOR TEMPERATURE >= 100.5 OR SYMPTOMS OF AN INFECTION, AND TO FOLLOW NEUTROPENIC PRECAUTIONS THROUGH THIS WEEKEND. REVIEWED NEXT WEEK'S APPOINTMENTS INCLUDING DR.LIVESAY'S WITH Regina Watts. LAST NIGHT Regina Watts. HAD TWO DIARRHEA STOOLS. SHE DID NOT TAKE ANYTHING FOR THE DIARRHEA AND HAS HAD ONE NORMAL BOWEL MOVEMENT TODAY. INSTRUCTED Regina Watts. TO GET IMODIUM TO HAVE AT HOME IF NEEDED. SHE IS EATING WELL BUT HAS ONLY HAD 16 OUNCES OF FLUID IN THE PAST 24 HOURS DUE TO NAUSEA. Regina Watts. HAS TAKEN THE ZOFRAN ONE TIME. STRESSED TO Regina Watts. THE IMPORTANCE OF FORCING FLUIDS AND TO TAKE HER MEDICATION FOR NAUSEA. SHE VOICES UNDERSTANDING. Regina Watts. MENTIONED THE SOLES OF HER FEET ACROSS THE BALLS OF HER FEET ARE SENSITIVE. SHE IS HAVING SOME DIFFICULTY WALKING. THIS INFORMATION WAS GIVEN TO DR.LIVESAY'S NURSE, TAMMI HOLLAND,RN.

## 2014-04-19 NOTE — Telephone Encounter (Signed)
Message copied by Patton Salles on Thu Apr 19, 2014 10:39 AM ------      Message from: Evlyn Clines P      Created: Thu Apr 19, 2014 10:19 AM       She missed MD apt yesterday but did have CBC -- not neutropenic yet as of yesterday, but likely will be in next 1-2 days based on cycle 1 dose dense chemo. Please have her start cipro 250 mg bid x 3 days. She should call for temp >=100.5 or sx infection, and follow neutropenic precautions thru this weekend      Please be sure she knows about my apt next week.            She is deaf, but uses interpreter phone system            Thank you ------

## 2014-04-23 ENCOUNTER — Other Ambulatory Visit: Payer: Self-pay | Admitting: Oncology

## 2014-04-25 ENCOUNTER — Other Ambulatory Visit (HOSPITAL_BASED_OUTPATIENT_CLINIC_OR_DEPARTMENT_OTHER): Payer: BC Managed Care – PPO

## 2014-04-25 ENCOUNTER — Telehealth: Payer: Self-pay | Admitting: Oncology

## 2014-04-25 ENCOUNTER — Ambulatory Visit (HOSPITAL_BASED_OUTPATIENT_CLINIC_OR_DEPARTMENT_OTHER): Payer: BC Managed Care – PPO | Admitting: Oncology

## 2014-04-25 ENCOUNTER — Encounter: Payer: Self-pay | Admitting: Oncology

## 2014-04-25 ENCOUNTER — Telehealth: Payer: Self-pay | Admitting: *Deleted

## 2014-04-25 VITALS — BP 118/79 | HR 83 | Temp 98.7°F | Resp 18 | Ht 67.0 in | Wt 168.3 lb

## 2014-04-25 DIAGNOSIS — K1231 Oral mucositis (ulcerative) due to antineoplastic therapy: Secondary | ICD-10-CM | POA: Diagnosis not present

## 2014-04-25 DIAGNOSIS — K1370 Unspecified lesions of oral mucosa: Secondary | ICD-10-CM

## 2014-04-25 DIAGNOSIS — Z171 Estrogen receptor negative status [ER-]: Secondary | ICD-10-CM

## 2014-04-25 DIAGNOSIS — C50219 Malignant neoplasm of upper-inner quadrant of unspecified female breast: Secondary | ICD-10-CM

## 2014-04-25 DIAGNOSIS — C50212 Malignant neoplasm of upper-inner quadrant of left female breast: Secondary | ICD-10-CM

## 2014-04-25 LAB — CBC WITH DIFFERENTIAL/PLATELET
BASO%: 0.6 % (ref 0.0–2.0)
BASOS ABS: 0.1 10*3/uL (ref 0.0–0.1)
EOS%: 0 % (ref 0.0–7.0)
Eosinophils Absolute: 0 10*3/uL (ref 0.0–0.5)
HCT: 33.9 % — ABNORMAL LOW (ref 34.8–46.6)
HEMOGLOBIN: 11.5 g/dL — AB (ref 11.6–15.9)
LYMPH#: 0.9 10*3/uL (ref 0.9–3.3)
LYMPH%: 10.6 % — ABNORMAL LOW (ref 14.0–49.7)
MCH: 31.3 pg (ref 25.1–34.0)
MCHC: 33.9 g/dL (ref 31.5–36.0)
MCV: 92.4 fL (ref 79.5–101.0)
MONO#: 0.5 10*3/uL (ref 0.1–0.9)
MONO%: 6.4 % (ref 0.0–14.0)
NEUT#: 6.9 10*3/uL — ABNORMAL HIGH (ref 1.5–6.5)
NEUT%: 82.4 % — ABNORMAL HIGH (ref 38.4–76.8)
Platelets: 107 10*3/uL — ABNORMAL LOW (ref 145–400)
RBC: 3.67 10*6/uL — AB (ref 3.70–5.45)
RDW: 12.3 % (ref 11.2–14.5)
WBC: 8.4 10*3/uL (ref 3.9–10.3)

## 2014-04-25 LAB — COMPREHENSIVE METABOLIC PANEL (CC13)
ALBUMIN: 3.4 g/dL — AB (ref 3.5–5.0)
ALK PHOS: 113 U/L (ref 40–150)
ALT: 45 U/L (ref 0–55)
AST: 29 U/L (ref 5–34)
Anion Gap: 7 mEq/L (ref 3–11)
BUN: 9.1 mg/dL (ref 7.0–26.0)
CO2: 28 mEq/L (ref 22–29)
Calcium: 9.3 mg/dL (ref 8.4–10.4)
Chloride: 109 mEq/L (ref 98–109)
Creatinine: 0.8 mg/dL (ref 0.6–1.1)
Glucose: 133 mg/dl (ref 70–140)
POTASSIUM: 4.5 meq/L (ref 3.5–5.1)
Sodium: 144 mEq/L (ref 136–145)
Total Bilirubin: 0.26 mg/dL (ref 0.20–1.20)
Total Protein: 6.2 g/dL — ABNORMAL LOW (ref 6.4–8.3)

## 2014-04-25 MED ORDER — DEXAMETHASONE 4 MG PO TABS: ORAL_TABLET | ORAL | Status: DC

## 2014-04-25 MED ORDER — TRIAMCINOLONE ACETONIDE 0.1 % MT PSTE: PASTE | OROMUCOSAL | Status: DC

## 2014-04-25 NOTE — Telephone Encounter (Signed)
per pof to sch appts-sent emailt o MW to see if correct time was alloted for trmt on 6/19-adv pt will call to let her know how ong trmt will be-pt understood-gave copy of sch to pt

## 2014-04-25 NOTE — Telephone Encounter (Signed)
Per POF and staff message I have adjsuted 6/19 appt

## 2014-04-25 NOTE — Progress Notes (Signed)
OFFICE PROGRESS NOTE   04/25/2014   Physicians:David Cristy Hilts, Karrar Husein, John McComb, Earle Gell   INTERVAL HISTORY:  Patient is seen, together with sign language interpreter, in continuing attention to adjuvant chemotherapy in process for T1N0 triple negative carcinoma of left breast. She had cycle 2 dose dense adriamycin cytoxan on 04-13-14, with neulasta on 04-14-14. We plan to change regimen now to taxotere cytoxan q 3 weeks x 4 cycles, due to total adriamycin dose now of 355 mg/m2. Patient missed MD appointment on 04-18-14 (day 6 cycle 2), but did have CBC done and was not neutropenic and counts otherwise not markedly low. Despite oral ice during adriamycin she has had oral mucositis symptoms not improved with Biotene; mouth now just feels "itchy" however she has ulcerations inner lips. She has had no nausea, has still been eating and drinking fluids, had only mild aches in legs from neulasta. She was tired last week, but energy is good today. Soles of feet were sore for several days after chemo, resolved now. Her husband is already en route from [place] for anticipated chemotherapy on 04-27-14.   She has PAC  ONCOLOGIC HISTORY History is of right breast cancer diagnosed January 2002 when patient was age 44 and premenopausal, T2N0M0 (4cm grade 3 invasive ductal carcinoma, <25% high grade DCIS, S phase 8.6%), ER negative, PR negative, HER 2 positive. She had lumpectomy with 2 negative sentinel nodes, adjuvant chemotherapy by Dr Starr Sinclair with adriamycin cytoxan x 4 cycles from March 2002 thru May 2002 (total adriamycin dose 409 mg with weight 142, height 5'7", for total adriamycin 235 mg/m2), and local radiation (50.4 Gy with boost to 60.4) Gy Note she declined PAC and received the chemotherapy in 2002 by peripheral vein. I followed her beginning Sept 2003, after Dr Pearlie Oyster left [place], until last previous visit 07-2011. She declined TEACH trial with lapatinib in 2007 and  2008. Last breast MRI previously was 08-2011.  Patient had infertility treatments at [facility] in 2005, daughter born 02-01-2005. Menses had been very infrequent after chemo until the infertility treatment, then stopped after that pregnancy.  Patient had bilateral mammograms at [facility] office early 12-2013, with area questioned on left compared with last prior mammograms of 08-2012. She had diagnostic left mammogram and [place] at [facility] on 01-01-14 which showed a mass in left upper inner breast posteriorly 1.5 x 1.4 x 1.3 cm on [place], with no abnormal left axillary nodes identified. She had biopsy on 01-01-14 with pathology ([id number]) showing grade 1-2 invasive ductal carcinoma ER, PR and HER 2 negative with Ki 66%. She had breast MRI 01-15-2014 with solitary enhancing mass upper inner left breast 1.7 x 2.0 x 1.8 cm, right breast not remarkable, no abnormal appearing lymph nodes. She had CT CAP 01-29-14 then MRI liver 02-05-14 with no evidence of metastatic disease. Genetics testing with Breast/Ovarian Cancer Gene Panel [phone] did not reveal a pathogenic mutation in these genes, tho she did have 3 variants of uncertain significance (ATM, [id number]; ATM, c.8674G>A and PMS2, C.1309C>T). She had consultation with Dr Tammi Klippel prior to definitive surgery. She had lumpectomy and left axillary sentinel node biopsy by Dr Lucia Gaskins on 03-06-2014. Pathology [id number]) found 1.8 cm grade II invasive ductal carcinoma with one negative sentinel node and final margins negative. She had PAC placed by Dr Lucia Gaskins.  Chemotherapy began with dose dense AC on 03-30-14, planning 2 cycles of AC due to prior adriamycin treatment, then 4 cycles of taxotere cytoxan.  Review of systems as  above, also: No fever or symptoms of infection. No SOB, LE swelling, chest pain. No problems with PAC. Bowels moving regularly since one episode of diarrhea last week.  Remainder of 10 point Review of Systems negative.  Objective:  Vital signs in last  24 hours:  BP 118/79  Pulse 83  Temp(Src) 98.7 F (37.1 C) (Oral)  Resp 18  Ht '5\' 7"'  (1.702 m)  Wt 168 lb 4.8 oz (76.34 kg)  BMI 26.35 kg/m2  Weight is down 2 lbs.  Alert, oriented and appropriate. Ambulatory without difficulty.  Alopecia  HEENT:PERRL, sclerae not icteric. Oral mucosa moist without lesions mouth or posterior pharynx other than aphthous ulcerations inner upper and lower lips. Neck supple. No JVD.  Lymphatics:no cervical,suraclavicular, axillary or inguinal adenopathy Resp: clear to auscultation bilaterally and normal percussion bilaterally Cardio: regular rate and rhythm. No gallop. GI: soft, nontender, not distended, no mass or organomegaly. Normally active bowel sounds.  Musculoskeletal/ Extremities: without pitting edema, cords, tenderness. Back not tender Neuro: congenital deafness, no peripheral neuropathy. Otherwise nonfocal. PSYCH mood and affect normal. Skin without rash, ecchymosis, petechiae. Slight dry desquamation soles of feet, without erythema or swelling now. Breasts: Right with well healed lumpectomy scar and no dominant mass, skin or nipple findings. Left with less seroma/hematoma fullness at otherwise well healed lumpectomy scar, no skin or nipple changes, no dominant mass otherwise.  Axillae benign. Portacath-without erythema or tenderness  Lab Results:  Results for orders placed in visit on 04/25/14  CBC WITH DIFFERENTIAL      Result Value Ref Range   WBC 8.4  3.9 - 10.3 10e3/uL   NEUT# 6.9 (*) 1.5 - 6.5 10e3/uL   HGB 11.5 (*) 11.6 - 15.9 g/dL   HCT 33.9 (*) 34.8 - 46.6 %   Platelets 107 (*) 145 - 400 10e3/uL   MCV 92.4  79.5 - 101.0 fL   MCH 31.3  25.1 - 34.0 pg   MCHC 33.9  31.5 - 36.0 g/dL   RBC 3.67 (*) 3.70 - 5.45 10e6/uL   RDW 12.3  11.2 - 14.5 %   lymph# 0.9  0.9 - 3.3 10e3/uL   MONO# 0.5  0.1 - 0.9 10e3/uL   Eosinophils Absolute 0.0  0.0 - 0.5 10e3/uL   Basophils Absolute 0.1  0.0 - 0.1 10e3/uL   NEUT% 82.4 (*) 38.4 - 76.8 %    LYMPH% 10.6 (*) 14.0 - 49.7 %   MONO% 6.4  0.0 - 14.0 %   EOS% 0.0  0.0 - 7.0 %   BASO% 0.6  0.0 - 2.0 %  COMPREHENSIVE METABOLIC PANEL (MW41)      Result Value Ref Range   Sodium 144  136 - 145 mEq/L   Potassium 4.5  3.5 - 5.1 mEq/L   Chloride 109  98 - 109 mEq/L   CO2 28  22 - 29 mEq/L   Glucose 133  70 - 140 mg/dl   BUN 9.1  7.0 - 26.0 mg/dL   Creatinine 0.8  0.6 - 1.1 mg/dL   Total Bilirubin 0.26  0.20 - 1.20 mg/dL   Alkaline Phosphatase 113  40 - 150 U/L   AST 29  5 - 34 U/L   ALT 45  0 - 55 U/L   Total Protein 6.2 (*) 6.4 - 8.3 g/dL   Albumin 3.4 (*) 3.5 - 5.0 g/dL   Calcium 9.3  8.4 - 10.4 mg/dL   Anion Gap 7  3 - 11 mEq/L     Studies/Results:  No results found.  Medications: I have reviewed the patient's current medications. Will add triamcinolone acetonide 0.1% dental paste bid.   DISCUSSION: teaching reviewed by RN for taxotere cytoxan q 3 weeks x 4, including decadron 8 mg with food bid x 3 days beginning day prior to taxotere. All questions answered and she is in agreement with planned treatment. Counts are just adequate for treatment with cycle 1 taxotere cytoxan on 04-27-14, which she wants to do particularly as husband has made arrangements with his work in [place] to be here. She understands that counts including platelets may drop with this treatment given lower counts today, and is aware of neulasta day after chemo. I have told her that we will follow counts prior to cycle 2 so that husband knows if there might be any delay then.   Assessment/Plan:  . T1cN0 triple negative invasive ductal left breast cancer: post lumpectomy and sentinel node evaluation and two cycles of dose dense AC given 03-30-14 - 04-13-14, with neulasta. Total adriamycin dose now 355 mg/m2 Will change now to taxotere cytoxan beginning 04-27-14 as long as counts repeated 6-19 have [place] >=1.5 and plt >=100k. IVF increased to keep bladder flushed with cytoxan. She will have neulasta day 2.  I will see  her with labs 6-26 and 05-09-14. 2.history of T2N0 ER PR negative HER 2 + right breast cancer in 2002 at age 52 (premenopausal): treatment as above including 4 cycles of AC . Marland Kitchen That adjuvant treatment did not include herceptin in 2002 and subsequently patient declined TEACH trial. No findings of concern on right by mammogram or MRI now  3.PAC placed at left breast surgery  4.adriamycin mucositis and aphthous ulcerations: triamcinolone acetonide dental paste now.  5.post menopausal since ~ age 89. She has not had bone density scan at least listed in this EMR  6.congenital deafness. Needs sign language interpreter with all visits. 7.negative colonoscopy ~ Jan 2015 8.bilateral breast ca: Genetics testing with Breast/Ovarian Gene Panel was negative in March 2015, tho she did have 3 variants of uncertain significance.     LIVESAY,LENNIS P, MD   04/25/2014, 2:04 PM

## 2014-04-26 ENCOUNTER — Telehealth: Payer: Self-pay | Admitting: Oncology

## 2014-04-26 ENCOUNTER — Other Ambulatory Visit: Payer: Self-pay

## 2014-04-26 ENCOUNTER — Other Ambulatory Visit: Payer: Self-pay | Admitting: Oncology

## 2014-04-26 NOTE — Telephone Encounter (Signed)
cld pt to adv of appt/trmt time for 6/19-left message with automatd line that will realy message to pt

## 2014-04-27 ENCOUNTER — Other Ambulatory Visit (HOSPITAL_BASED_OUTPATIENT_CLINIC_OR_DEPARTMENT_OTHER): Payer: BC Managed Care – PPO

## 2014-04-27 ENCOUNTER — Other Ambulatory Visit: Payer: Self-pay | Admitting: Oncology

## 2014-04-27 ENCOUNTER — Ambulatory Visit (HOSPITAL_BASED_OUTPATIENT_CLINIC_OR_DEPARTMENT_OTHER): Payer: BC Managed Care – PPO

## 2014-04-27 VITALS — BP 136/72 | HR 73 | Temp 97.9°F | Resp 18

## 2014-04-27 DIAGNOSIS — C50219 Malignant neoplasm of upper-inner quadrant of unspecified female breast: Secondary | ICD-10-CM

## 2014-04-27 DIAGNOSIS — C50212 Malignant neoplasm of upper-inner quadrant of left female breast: Secondary | ICD-10-CM

## 2014-04-27 DIAGNOSIS — Z5111 Encounter for antineoplastic chemotherapy: Secondary | ICD-10-CM | POA: Diagnosis not present

## 2014-04-27 LAB — CBC WITH DIFFERENTIAL/PLATELET
BASO%: 0.4 % (ref 0.0–2.0)
BASOS ABS: 0.1 10*3/uL (ref 0.0–0.1)
EOS%: 0 % (ref 0.0–7.0)
Eosinophils Absolute: 0 10*3/uL (ref 0.0–0.5)
HEMATOCRIT: 36.1 % (ref 34.8–46.6)
HEMOGLOBIN: 12.1 g/dL (ref 11.6–15.9)
LYMPH%: 3.3 % — ABNORMAL LOW (ref 14.0–49.7)
MCH: 31.1 pg (ref 25.1–34.0)
MCHC: 33.6 g/dL (ref 31.5–36.0)
MCV: 92.4 fL (ref 79.5–101.0)
MONO#: 0.4 10*3/uL (ref 0.1–0.9)
MONO%: 2.5 % (ref 0.0–14.0)
NEUT#: 16.5 10*3/uL — ABNORMAL HIGH (ref 1.5–6.5)
NEUT%: 93.8 % — AB (ref 38.4–76.8)
PLATELETS: 189 10*3/uL (ref 145–400)
RBC: 3.91 10*6/uL (ref 3.70–5.45)
RDW: 12.3 % (ref 11.2–14.5)
WBC: 17.6 10*3/uL — AB (ref 3.9–10.3)
lymph#: 0.6 10*3/uL — ABNORMAL LOW (ref 0.9–3.3)

## 2014-04-27 MED ORDER — SODIUM CHLORIDE 0.9 % IJ SOLN
10.0000 mL | INTRAMUSCULAR | Status: DC | PRN
  Administered 2014-04-27: 10 mL
  Filled 2014-04-27: qty 10

## 2014-04-27 MED ORDER — DOCETAXEL CHEMO INJECTION 160 MG/16ML
75.0000 mg/m2 | Freq: Once | INTRAVENOUS | Status: AC
  Administered 2014-04-27: 140 mg via INTRAVENOUS
  Filled 2014-04-27: qty 14

## 2014-04-27 MED ORDER — SODIUM CHLORIDE 0.9 % IV SOLN
600.0000 mg/m2 | Freq: Once | INTRAVENOUS | Status: AC
  Administered 2014-04-27: 1140 mg via INTRAVENOUS
  Filled 2014-04-27: qty 57

## 2014-04-27 MED ORDER — HEPARIN SOD (PORK) LOCK FLUSH 100 UNIT/ML IV SOLN
500.0000 [IU] | Freq: Once | INTRAVENOUS | Status: AC | PRN
Start: 2014-04-27 — End: 2014-04-27
  Administered 2014-04-27: 500 [IU]
  Filled 2014-04-27: qty 5

## 2014-04-27 MED ORDER — ONDANSETRON 16 MG/50ML IVPB (CHCC)
16.0000 mg | Freq: Once | INTRAVENOUS | Status: AC
  Administered 2014-04-27: 16 mg via INTRAVENOUS

## 2014-04-27 MED ORDER — ONDANSETRON 16 MG/50ML IVPB (CHCC)
INTRAVENOUS | Status: AC
  Filled 2014-04-27: qty 16

## 2014-04-27 MED ORDER — SODIUM CHLORIDE 0.9 % IV SOLN
Freq: Once | INTRAVENOUS | Status: AC
  Administered 2014-04-27: 10:00:00 via INTRAVENOUS

## 2014-04-27 MED ORDER — DEXAMETHASONE SODIUM PHOSPHATE 10 MG/ML IJ SOLN
10.0000 mg | Freq: Once | INTRAMUSCULAR | Status: AC
  Administered 2014-04-27: 10 mg via INTRAVENOUS

## 2014-04-27 MED ORDER — DEXAMETHASONE SODIUM PHOSPHATE 10 MG/ML IJ SOLN
INTRAMUSCULAR | Status: AC
  Filled 2014-04-27: qty 1

## 2014-04-27 NOTE — Patient Instructions (Signed)
[facility] Discharge Instructions for Patients Receiving Chemotherapy  Today you received the following chemotherapy agents Taxotere and Cytoxan.   To help prevent nausea and vomiting after your treatment, we encourage you to take your nausea medication.   If you develop nausea and vomiting that is not controlled by your nausea medication, call the clinic.   BELOW ARE SYMPTOMS THAT SHOULD BE REPORTED IMMEDIATELY:  *FEVER GREATER THAN 100.5 F  *CHILLS WITH OR WITHOUT FEVER  NAUSEA AND VOMITING THAT IS NOT CONTROLLED WITH YOUR NAUSEA MEDICATION  *UNUSUAL SHORTNESS OF BREATH  *UNUSUAL BRUISING OR BLEEDING  TENDERNESS IN MOUTH AND THROAT WITH OR WITHOUT PRESENCE OF ULCERS  *URINARY PROBLEMS  *BOWEL PROBLEMS  UNUSUAL RASH Items with * indicate a potential emergency and should be followed up as soon as possible.  Feel free to call the clinic you have any questions or concerns. The clinic phone number is [phone].

## 2014-04-28 ENCOUNTER — Ambulatory Visit (HOSPITAL_BASED_OUTPATIENT_CLINIC_OR_DEPARTMENT_OTHER): Payer: BC Managed Care – PPO

## 2014-04-28 VITALS — BP 114/67 | HR 77 | Temp 97.8°F

## 2014-04-28 DIAGNOSIS — C50212 Malignant neoplasm of upper-inner quadrant of left female breast: Secondary | ICD-10-CM

## 2014-04-28 DIAGNOSIS — Z5189 Encounter for other specified aftercare: Secondary | ICD-10-CM

## 2014-04-28 DIAGNOSIS — C50219 Malignant neoplasm of upper-inner quadrant of unspecified female breast: Secondary | ICD-10-CM

## 2014-04-28 MED ORDER — PEGFILGRASTIM INJECTION 6 MG/0.6ML
6.0000 mg | Freq: Once | SUBCUTANEOUS | Status: AC
  Administered 2014-04-28: 6 mg via SUBCUTANEOUS

## 2014-04-29 ENCOUNTER — Telehealth: Payer: Self-pay | Admitting: Oncology

## 2014-04-29 NOTE — Telephone Encounter (Signed)
Per LL to chge appt to 30 min to open spot for another p

## 2014-04-30 ENCOUNTER — Telehealth: Payer: Self-pay | Admitting: *Deleted

## 2014-04-30 NOTE — Telephone Encounter (Signed)
Message copied by Cherylynn Ridges on Mon Apr 30, 2014  2:08 PM ------      Message from: Jaci Carrel A      Created: Fri Apr 27, 2014  1:04 PM      Regarding: chemo follow up call       First time Taxotere and Cytoxan. Has had Cytoxan with AC prior. Dr Marko Plume. Patient is hearing impaired but has ability to receive phone calls.             Thanks ------

## 2014-04-30 NOTE — Telephone Encounter (Signed)
Called Regina Watts at [phone] number(s).  Message left requesting a return call for chemotherapy follow up.  Awaiting return call from patient.

## 2014-05-02 ENCOUNTER — Telehealth: Payer: Self-pay

## 2014-05-02 NOTE — Telephone Encounter (Signed)
LM for Ms. Cassetta that this nurse was returning her call from 73.  She may call back and if Dr. Mariana Kaufman nurse not available she can ask to speak with the triage nurse if issue is urgent.

## 2014-05-03 ENCOUNTER — Other Ambulatory Visit: Payer: Self-pay | Admitting: Oncology

## 2014-05-03 NOTE — Telephone Encounter (Signed)
Tried to reach Regina Watts today at home number x2.  Left a message stating that this nurse was following up her symptoms discussed yesterday. Reached her mother.She said that the rash is worse but the soreness in hands and feet was better today. She has the follow up appointment tomorrow with Dr. Marko Plume at 0930.  If the rash is progressing, she could go to the [facility] ED. To have it evaluated.

## 2014-05-03 NOTE — Telephone Encounter (Signed)
Reached Regina Watts 05-02-14 at 1430. She states that her feet and hands are sore.  They feel like they are on fire. She is using Gold bond foot cream. Suggested she try 1/2 to 1 norco tab and soak her hands and feet in cold water for ~15 minutes 4-5 times a day. She also has a rash on her face,chest, arms, and legs that is sore to the touch.  Rash is raised and lesions pea sized. Afebrile. The dental past is not effective on the oral ulcers but able to drink fluids

## 2014-05-04 ENCOUNTER — Other Ambulatory Visit (HOSPITAL_BASED_OUTPATIENT_CLINIC_OR_DEPARTMENT_OTHER): Payer: BC Managed Care – PPO

## 2014-05-04 ENCOUNTER — Ambulatory Visit: Payer: Medicare Other | Admitting: Oncology

## 2014-05-04 ENCOUNTER — Ambulatory Visit (HOSPITAL_BASED_OUTPATIENT_CLINIC_OR_DEPARTMENT_OTHER): Payer: Medicare Other | Admitting: Oncology

## 2014-05-04 ENCOUNTER — Encounter: Payer: Self-pay | Admitting: Oncology

## 2014-05-04 VITALS — BP 122/74 | HR 99 | Temp 98.9°F | Resp 20 | Ht 67.0 in | Wt 162.1 lb

## 2014-05-04 DIAGNOSIS — T451X5A Adverse effect of antineoplastic and immunosuppressive drugs, initial encounter: Secondary | ICD-10-CM | POA: Diagnosis not present

## 2014-05-04 DIAGNOSIS — C50212 Malignant neoplasm of upper-inner quadrant of left female breast: Secondary | ICD-10-CM

## 2014-05-04 DIAGNOSIS — C50219 Malignant neoplasm of upper-inner quadrant of unspecified female breast: Secondary | ICD-10-CM

## 2014-05-04 DIAGNOSIS — R21 Rash and other nonspecific skin eruption: Secondary | ICD-10-CM

## 2014-05-04 LAB — COMPREHENSIVE METABOLIC PANEL (CC13)
ALK PHOS: 128 U/L (ref 40–150)
ALT: 44 U/L (ref 0–55)
AST: 33 U/L (ref 5–34)
Albumin: 3.6 g/dL (ref 3.5–5.0)
Anion Gap: 12 mEq/L — ABNORMAL HIGH (ref 3–11)
BILIRUBIN TOTAL: 0.61 mg/dL (ref 0.20–1.20)
BUN: 8.6 mg/dL (ref 7.0–26.0)
CO2: 24 mEq/L (ref 22–29)
Calcium: 9.7 mg/dL (ref 8.4–10.4)
Chloride: 104 mEq/L (ref 98–109)
Creatinine: 0.8 mg/dL (ref 0.6–1.1)
Glucose: 118 mg/dl (ref 70–140)
Potassium: 3.9 mEq/L (ref 3.5–5.1)
Sodium: 139 mEq/L (ref 136–145)
TOTAL PROTEIN: 6.6 g/dL (ref 6.4–8.3)

## 2014-05-04 LAB — CBC WITH DIFFERENTIAL/PLATELET
BASO%: 1.3 % (ref 0.0–2.0)
Basophils Absolute: 0.1 10*3/uL (ref 0.0–0.1)
EOS%: 0.2 % (ref 0.0–7.0)
Eosinophils Absolute: 0 10*3/uL (ref 0.0–0.5)
HCT: 32.7 % — ABNORMAL LOW (ref 34.8–46.6)
HGB: 11.1 g/dL — ABNORMAL LOW (ref 11.6–15.9)
LYMPH%: 20.1 % (ref 14.0–49.7)
MCH: 30.8 pg (ref 25.1–34.0)
MCHC: 33.8 g/dL (ref 31.5–36.0)
MCV: 91 fL (ref 79.5–101.0)
MONO#: 1.2 10*3/uL — ABNORMAL HIGH (ref 0.1–0.9)
MONO%: 25.9 % — AB (ref 0.0–14.0)
NEUT#: 2.5 10*3/uL (ref 1.5–6.5)
NEUT%: 52.5 % (ref 38.4–76.8)
PLATELETS: 150 10*3/uL (ref 145–400)
RBC: 3.59 10*6/uL — AB (ref 3.70–5.45)
RDW: 12.4 % (ref 11.2–14.5)
WBC: 4.8 10*3/uL (ref 3.9–10.3)
lymph#: 1 10*3/uL (ref 0.9–3.3)

## 2014-05-04 LAB — TECHNOLOGIST REVIEW

## 2014-05-04 MED ORDER — TRAMADOL HCL 50 MG PO TABS: 50.0000 mg | ORAL_TABLET | Freq: Four times a day (QID) | ORAL | Status: DC | PRN

## 2014-05-04 NOTE — Patient Instructions (Signed)
Use benadryl (diphenhydramine) 25 mg every 4-6 hours for itching  Take claritin (lortadine) 10 mg now and once daily, also to help itching  Try Tramadol 50 mg every 6 hours for pain in feet. Call if this does not help pain or if it causes nausea.  Use baking soda 1 tsp in water swish and spit as mouthwash ~ every 2 hours.  Dr Rolm Bookbinder will see you at 1:45 today as work in patient. Take papers to her.

## 2014-05-04 NOTE — Progress Notes (Signed)
OFFICE PROGRESS NOTE   05/04/2014   Physicians:David Cristy Hilts, Karrar Husein, Arvella Nigh, Roni Bread Lomax   INTERVAL HISTORY:  Patient is seen, together with sign language interpreter, for scheduled visit but with acute problem since first cycle of cytoxan taxotere given 04-27-14. She had neulasta on 04-28-14.  From history now, she developed skin rash and soreness hands and feet on day 4 (04-30-14), which was progressed to a very puritic rash mostly on upper extremities, some on upper trunk and scattered on LE, as well as soles of feet so painful that she has difficulty walking, and sore and swollen hands. She also has more oral ulcers not improved with triamcinolone acetonide dental paste or mouthwashes, tho she is able to eat and drink. She denies fever, respiratory problems, diarrhea or significant N/V. She has not used any pain medication, as hydrocodone that she had available caused nausea previously. It has been difficult for our office to reach her by phone in last couple of days; she does tell me that we can speak with mother (who is hard of hearing) or sister if needed.   I have reviewed drug information and discussed with [place] pharmacist. There are case reports of hand foot syndrome and of rash with taxotere, tho no specific interventions that we can find for these problems. I have also spoken by phone with Dr Rolm Bookbinder, whom patient has seen in past. With hand and foot findings together with rash it seems most likely that this is all drug related, however Dr Ubaldo Glassing has kindly offered to work patient in after my visit today, as certainly we do not want to miss disseminated zoster.   ONCOLOGIC HISTORY History is of right breast cancer diagnosed January 2002 when patient was age 63 and premenopausal, T2N0M0 (4cm grade 3 invasive ductal carcinoma, <25% high grade DCIS, S phase 8.6%), ER negative, PR negative, HER 2 positive. She had lumpectomy with 2 negative  sentinel nodes, adjuvant chemotherapy by Dr Starr Sinclair with adriamycin cytoxan x 4 cycles from March 2002 thru May 2002 (total adriamycin dose 409 mg with weight 142, height 5'7", for total adriamycin 235 mg/m2), and local radiation (50.4 Gy with boost to 60.4) Gy Note she declined PAC and received the chemotherapy in 2002 by peripheral vein. I followed her beginning Sept 2003, after Dr Pearlie Oyster left [place], until last previous visit 07-2011. She declined TEACH trial with lapatinib in 2007 and 2008. Last breast MRI previously was 08-2011.  Patient had infertility treatments at [facility] in 2005, daughter born 02-01-2005. Menses had been very infrequent after chemo until the infertility treatment, then stopped after that pregnancy.  Patient had bilateral mammograms at Dr [facility] office early 12-2013, with area questioned on left compared with last prior mammograms of 08-2012. She had diagnostic left mammogram and [place] at [facility] on 01-01-14 which showed a mass in left upper inner breast posteriorly 1.5 x 1.4 x 1.3 cm on [place], with no abnormal left axillary nodes identified. She had biopsy on 01-01-14 with pathology ([id number]) showing grade 1-2 invasive ductal carcinoma ER, PR and HER 2 negative with Ki 66%. She had breast MRI 01-15-2014 with solitary enhancing mass upper inner left breast 1.7 x 2.0 x 1.8 cm, right breast not remarkable, no abnormal appearing lymph nodes. She had CT CAP 01-29-14 then MRI liver 02-05-14 with no evidence of metastatic disease. Genetics testing with Breast/Ovarian Cancer Gene Panel [phone] did not reveal a pathogenic mutation in these genes, tho she did have  3 variants of uncertain significance (ATM, c.[id number]; ATM, c.8674G>A and PMS2, C.1309C>T). She had consultation with Dr Tammi Klippel prior to definitive surgery. She had lumpectomy and left axillary sentinel node biopsy by Dr Lucia Gaskins on 03-06-2014. Pathology [id number] [id number]) found 1.8 cm grade II invasive ductal carcinoma with one negative  sentinel node and final margins negative. She had PAC placed by Dr Lucia Gaskins.  Chemotherapy began with 2 cycles of dose dense AC on 03-30-14 and 04-13-14, then one treatment of cytoxan taxotere on 2-84-13 complicated by severe rash and hand foot syndrome.  Review of systems as above, also: No pain in throat. Tired, sleeping poorly with pruritic rash and painful hands and feet. Slight aching across shoulders after neulasta, did not use claritin. No bleeding. Is using lotion on hands and feet, not helping. No problems with PAC. Remainder of 10 point Review of Systems negative.  Daughter will be spending summer in [place] with [place] husband. He will be in [place] next week and can come to visit with her to discuss changing chemo again due to these side effects.  Objective:  Vital signs in last 24 hours:  BP 122/74  Pulse 99  Temp(Src) 98.9 F (37.2 C) (Oral)  Resp 20  Ht 5' 7" (1.702 m)  Wt 162 lb 1.6 oz (73.528 kg)  BMI 25.38 kg/m2 weight is down 6 lbs.  Alert, oriented and appropriate. Scratching UE frequently. Looks uncomfortable but NAD. Ambulatory with difficulty due to painful feet. Alopecia  HEENT:PERRL, sclerae not icteric. Oral mucosa moist. Ulcers inner lower lip. No thrush.  Neck supple. No JVD.  Lymphatics:no cervical,suraclavicular, axillary or inguinal adenopathy Resp: clear to auscultation bilaterally and normal percussion bilaterally Cardio: regular rate and rhythm. No gallop. GI: soft, nontender, not distended, no mass or organomegaly. Normally active bowel sounds.  Musculoskeletal/ Extremities: No swelling UE except hands puffy and erythematous especially on palms, no desquamation. LE no edema or cords. Feet swollen and erythematous especially soles, which are extremely tender with some slight desquamation, no apparent suprainfection.  Neuro: no peripheral neuropathy. Otherwise nonfocal Skin excoriated, erythematous rash primarily dorsum of forearms onto upper arms, upper back  and chest, scattered on lower back and scattered on legs. No vesicles. Not clearly suprainfected. Breasts:Left lumpectomy scar fullness resolving, scar itself well healed and axillary scar also, without dominant mass, skin or nipple findings in left breast. Right breast with well healed lumpectomy scar, otherwise not remarkable.Marland Kitchen Axillae benign. Portacath-without erythema or tenderness  Lab Results:  Results for orders placed in visit on 05/04/14  TECHNOLOGIST REVIEW      Result Value Ref Range   Technologist Review       Value: Metas and Myelocytes, atypical lymphs, 8% nrbcs present  CBC WITH DIFFERENTIAL      Result Value Ref Range   WBC 4.8  3.9 - 10.3 10e3/uL   NEUT# 2.5  1.5 - 6.5 10e3/uL   HGB 11.1 (*) 11.6 - 15.9 g/dL   HCT 32.7 (*) 34.8 - 46.6 %   Platelets 150  145 - 400 10e3/uL   MCV 91.0  79.5 - 101.0 fL   MCH 30.8  25.1 - 34.0 pg   MCHC 33.8  31.5 - 36.0 g/dL   RBC 3.59 (*) 3.70 - 5.45 10e6/uL   RDW 12.4  11.2 - 14.5 %   lymph# 1.0  0.9 - 3.3 10e3/uL   MONO# 1.2 (*) 0.1 - 0.9 10e3/uL   Eosinophils Absolute 0.0  0.0 - 0.5 10e3/uL   Basophils Absolute 0.1  0.0 -  0.1 10e3/uL   NEUT% 52.5  38.4 - 76.8 %   LYMPH% 20.1  14.0 - 49.7 %   MONO% 25.9 (*) 0.0 - 14.0 %   EOS% 0.2  0.0 - 7.0 %   BASO% 1.3  0.0 - 2.0 %  COMPREHENSIVE METABOLIC PANEL (LG92)      Result Value Ref Range   Sodium 139  136 - 145 mEq/L   Potassium 3.9  3.5 - 5.1 mEq/L   Chloride 104  98 - 109 mEq/L   CO2 24  22 - 29 mEq/L   Glucose 118  70 - 140 mg/dl   BUN 8.6  7.0 - 26.0 mg/dL   Creatinine 0.8  0.6 - 1.1 mg/dL   Total Bilirubin 0.61  0.20 - 1.20 mg/dL   Alkaline Phosphatase 128  40 - 150 U/L   AST 33  5 - 34 U/L   ALT 44  0 - 55 U/L   Total Protein 6.6  6.4 - 8.3 g/dL   Albumin 3.6  3.5 - 5.0 g/dL   Calcium 9.7  8.4 - 10.4 mg/dL   Anion Gap 12 (*) 3 - 11 mEq/L   Left shift consistent with recent neulasta.  Studies/Results:  No results found.  Medications: I have reviewed the patient's  current medications. Will try tramadol 50 mg q 6 hrs, prn benadryl and prn claritin. I would like her to use baking soda in water as mouth rinse multiple times daily and add back biotene if possible.  Per my phone discussion with Dr Ubaldo Glassing after her exam, she will begin oral steroid taper + topical steroid and emollient lotion as she also thinks all of this is drug reaction.  From literature review that our pharmacist has found, not clear that steroids helpful with taxotere rash/ hand foot, but no contraindication and seems very reasonable to try.  DISCUSSION: Patient understands that these problems appear to be reaction to the taxotere, tho Dr Ubaldo Glassing is to evaluate to be sure she does not find another etiology. We will not use further taxotere, which needs to be listed as allergy. She should call if anything is worse over weekend and let us know how she is on 6-29. I will see her again at least 7-1, when her husband will also be here, to discuss change in chemo regimen. Not discussed now, will use either weekly taxol + carbo or weekly taxol x 12 weeks, beginning after this acute problem is improved sufficiently.  Even with discomfort in feet, Jazmen does not want family to drive her to Dr Josie Dixon appointment.  Assessment/Plan:  1.Pruritic rash and palmar plantar syndrome, which are reported as rare side effects of taxotere. Appreciate Dr Josie Dixon assistance today, and she is in agreement that this all appears to be drug effect. Plan symptomatic treatment and steroids as above.  2.T1cN0 triple negative invasive ductal left breast cancer: post lumpectomy and sentinel node evaluation and two cycles of dose dense AC given 03-30-14 - 04-13-14, with neulasta. Total adriamycin dose now 355 mg/m2. Cycle 1 cytoxan taxotere given 04-27-14 with neulasta 1-19, complicated by severe pruitic rash and hand foot syndrome. Will not use further taxotere. Expect will change to weekly carbo taxol vs weekly taxol x 12 after these  problems subside. 3.not neutropenic today and did have neulasta, but may still drop further in next several days. RN to follow up by phone on 6-29; would need to have counts checked and be worked in for visit then if concerns. 4.PAC  in 5.history of T2N0 ER PR negative HER 2 + right breast cancer in 2002 at age 71 (premenopausal): treatment as above including 4 cycles of AC . Marland Kitchen That adjuvant treatment did not include herceptin in 2002 and subsequently patient declined TEACH trial. No findings of concern on right by mammogram or MRI now 6.congenital deafness: needs sign language interpreter with all visits.  7.post menopausal since age 65. No bone density scan in this EMR 8.negative colonoscopy 11-2013 9.Breast Ovarian Gene Panel negative 01-2014 tho 3 variants of uncertain significance identified    Time spent 45 min including >50% counseling and coordination of care   LIVESAY,LENNIS P, MD   05/04/2014, 9:16 PM

## 2014-05-07 ENCOUNTER — Telehealth: Payer: Self-pay | Admitting: *Deleted

## 2014-05-07 NOTE — Telephone Encounter (Signed)
Per Dr. Marko Plume, I called patient to follow up with Friday's appointment. This message was left with the interpreter. Requested patient to call back and inform us how her skin and mouth sores are doing.

## 2014-05-07 NOTE — Telephone Encounter (Signed)
Reached Regina Watts and she states that she is doing much better. Mouth ulcers are gone. She is able to walk and sign with less difficulity. The rash is diminishing.   Dr. Marko Plume notified of information.

## 2014-05-08 ENCOUNTER — Other Ambulatory Visit: Payer: Self-pay

## 2014-05-08 DIAGNOSIS — C50212 Malignant neoplasm of upper-inner quadrant of left female breast: Secondary | ICD-10-CM

## 2014-05-09 ENCOUNTER — Ambulatory Visit (HOSPITAL_BASED_OUTPATIENT_CLINIC_OR_DEPARTMENT_OTHER): Payer: Medicare Other | Admitting: Oncology

## 2014-05-09 ENCOUNTER — Other Ambulatory Visit: Payer: Self-pay | Admitting: *Deleted

## 2014-05-09 ENCOUNTER — Telehealth: Payer: Self-pay | Admitting: *Deleted

## 2014-05-09 ENCOUNTER — Other Ambulatory Visit (HOSPITAL_BASED_OUTPATIENT_CLINIC_OR_DEPARTMENT_OTHER): Payer: BC Managed Care – PPO

## 2014-05-09 ENCOUNTER — Telehealth: Payer: Self-pay | Admitting: Oncology

## 2014-05-09 ENCOUNTER — Encounter: Payer: Self-pay | Admitting: Oncology

## 2014-05-09 ENCOUNTER — Telehealth: Payer: Self-pay

## 2014-05-09 VITALS — BP 133/73 | HR 80 | Temp 98.9°F | Resp 18 | Ht 67.0 in | Wt 165.5 lb

## 2014-05-09 DIAGNOSIS — R21 Rash and other nonspecific skin eruption: Secondary | ICD-10-CM

## 2014-05-09 DIAGNOSIS — C50212 Malignant neoplasm of upper-inner quadrant of left female breast: Secondary | ICD-10-CM

## 2014-05-09 DIAGNOSIS — C50219 Malignant neoplasm of upper-inner quadrant of unspecified female breast: Secondary | ICD-10-CM | POA: Diagnosis not present

## 2014-05-09 DIAGNOSIS — D72829 Elevated white blood cell count, unspecified: Secondary | ICD-10-CM

## 2014-05-09 DIAGNOSIS — Z171 Estrogen receptor negative status [ER-]: Secondary | ICD-10-CM

## 2014-05-09 DIAGNOSIS — Z853 Personal history of malignant neoplasm of breast: Secondary | ICD-10-CM

## 2014-05-09 LAB — CBC WITH DIFFERENTIAL/PLATELET
BASO%: 1.5 % (ref 0.0–2.0)
BASOS ABS: 0.4 10*3/uL — AB (ref 0.0–0.1)
EOS%: 0 % (ref 0.0–7.0)
Eosinophils Absolute: 0 10*3/uL (ref 0.0–0.5)
HEMATOCRIT: 32.7 % — AB (ref 34.8–46.6)
HEMOGLOBIN: 11 g/dL — AB (ref 11.6–15.9)
LYMPH#: 1.6 10*3/uL (ref 0.9–3.3)
LYMPH%: 6.1 % — ABNORMAL LOW (ref 14.0–49.7)
MCH: 31.3 pg (ref 25.1–34.0)
MCHC: 33.6 g/dL (ref 31.5–36.0)
MCV: 92.9 fL (ref 79.5–101.0)
MONO#: 1.4 10*3/uL — ABNORMAL HIGH (ref 0.1–0.9)
MONO%: 5.4 % (ref 0.0–14.0)
NEUT#: 23 10*3/uL — ABNORMAL HIGH (ref 1.5–6.5)
NEUT%: 87 % — AB (ref 38.4–76.8)
Platelets: 115 10*3/uL — ABNORMAL LOW (ref 145–400)
RBC: 3.52 10*6/uL — ABNORMAL LOW (ref 3.70–5.45)
RDW: 14.1 % (ref 11.2–14.5)
WBC: 26.5 10*3/uL — ABNORMAL HIGH (ref 3.9–10.3)
nRBC: 2 % — ABNORMAL HIGH (ref 0–0)

## 2014-05-09 MED ORDER — DEXAMETHASONE 4 MG PO TABS: ORAL_TABLET | ORAL | Status: DC

## 2014-05-09 NOTE — Telephone Encounter (Signed)
, °

## 2014-05-09 NOTE — Telephone Encounter (Signed)
Dex prescription printed.  Called in to [facility].

## 2014-05-09 NOTE — Telephone Encounter (Signed)
Per staff message and POF I have scheduled appts. Advised scheduler of appts. JMW  

## 2014-05-09 NOTE — Progress Notes (Signed)
OFFICE PROGRESS NOTE   05/09/2014   Physicians:David Cristy Hilts, Karrar Husein, John McComb, Roni Bread Lomax   INTERVAL HISTORY:  Patient is seen, together with husband and sign language interpreter, in follow up of acute skin reaction from first cycle of adjuvant taxotere cytoxan given 04-27-14 for T51cN0 triple negative left breast cancer. She had developed extensive puritic rash on arms and upper trunk on ~ day 4, with severe hand foot syndrome. Since she was seen here and by Dr Rolm Bookbinder on 05-04-14, she has been on steroid taper (completes tomorrow) with topical steroid cream and emollient lotion, with rapid improvement. The rash is nearly resolved, feet are no longer symptomatic and not peeling, and hands are desquamating with fingertips tender. She is tolerating the steroids without difficulty and remains in good spirits in spite of the problems.  She has had no fever, no mucositis, no bleeding.  She has PAC.   ONCOLOGIC HISTORY History is of right breast cancer diagnosed January 2002 when patient was age 53 and premenopausal, T2N0M0 (4cm grade 3 invasive ductal carcinoma, <25% high grade DCIS, S phase 8.6%), ER negative, PR negative, HER 2 positive. She had lumpectomy with 2 negative sentinel nodes, adjuvant chemotherapy by Dr Starr Sinclair with adriamycin cytoxan x 4 cycles from March 2002 thru May 2002 (total adriamycin dose 409 mg with weight 142, height 5'7", for total adriamycin 235 mg/m2), and local radiation (50.4 Gy with boost to 60.4) Gy Note she declined PAC and received the chemotherapy in 2002 by peripheral vein. I followed her beginning Sept 2003, after Dr Pearlie Oyster left [place], until last previous visit 07-2011. She declined TEACH trial with lapatinib in 2007 and 2008. Last breast MRI previously was 08-2011.  Patient had infertility treatments at [facility] in 2005, daughter born 02-01-2005. Menses had been very infrequent after chemo until the infertility  treatment, then stopped after that pregnancy.  Patient had bilateral mammograms at Dr [facility] office early 12-2013, with area questioned on left compared with last prior mammograms of 08-2012. She had diagnostic left mammogram and [place] at [facility] on 01-01-14 which showed a mass in left upper inner breast posteriorly 1.5 x 1.4 x 1.3 cm on [place], with no abnormal left axillary nodes identified. She had biopsy on 01-01-14 with pathology ([id number]) showing grade 1-2 invasive ductal carcinoma ER, PR and HER 2 negative with Ki 66%. She had breast MRI 01-15-2014 with solitary enhancing mass upper inner left breast 1.7 x 2.0 x 1.8 cm, right breast not remarkable, no abnormal appearing lymph nodes. She had CT CAP 01-29-14 then MRI liver 02-05-14 with no evidence of metastatic disease. Genetics testing with Breast/Ovarian Cancer Gene Panel [phone] did not reveal a pathogenic mutation in these genes, tho she did have 3 variants of uncertain significance (ATM, c.[id number]; ATM, c.8674G>A and PMS2, C.1309C>T). She had consultation with Dr Tammi Klippel prior to definitive surgery. She had lumpectomy and left axillary sentinel node biopsy by Dr Lucia Gaskins on 03-06-2014. Pathology [id number] [id number]) found 1.8 cm grade II invasive ductal carcinoma with one negative sentinel node and final margins negative. She had PAC placed by Dr Lucia Gaskins.  Chemotherapy began with 2 cycles of dose dense AC on 03-30-14 and 04-13-14, then one treatment of cytoxan taxotere on 02-18-50 complicated by severe rash and hand foot syndrome.  Review of systems as above, also: No fever. No diarrhea. No respiratory problems. No new or different pain. Remainder of 10 point Review of Systems negative.  Objective:  Vital signs in last 24 hours:  BP 133/73  Pulse 80  Temp(Src) 98.9 F (37.2 C) (Oral)  Resp 18  Ht '5\' 7"'  (1.702 m)  Wt 165 lb 8 oz (75.07 kg)  BMI 25.91 kg/m2 weight is up 3 lbs from last week.  Alert, oriented and appropriate. Ambulatory without  difficulty.  Alopecia  HEENT:PERRL, sclerae not icteric. Oral mucosa moist without lesions, posterior pharynx clear.  Neck supple. No JVD.  Lymphatics:no cervical,suraclavicular adenopathy Resp: clear to auscultation bilaterally and normal percussion bilaterally Cardio: regular rate and rhythm. No gallop. GI: soft, nontender, not distended. Normally active bowel sounds.  Musculoskeletal/ Extremities: without pitting edema, cords, tenderness Neuro: no peripheral neuropathy. Congenital deafness, otherwise nonfocal Skin rash nearly resolved on extremities and upper chest. Soles of feet with prominent callous MTP heads and heels, no swelling, minimal erythema, no skin desquamation. Palms and fingers with thick layers of skin peeling, fingers still somewhat puffy. Portacath-without erythema or tenderness  Lab Results:  Results for orders placed in visit on 05/09/14  CBC WITH DIFFERENTIAL      Result Value Ref Range   WBC 26.5 (*) 3.9 - 10.3 10e3/uL   NEUT# 23.0 (*) 1.5 - 6.5 10e3/uL   HGB 11.0 (*) 11.6 - 15.9 g/dL   HCT 32.7 (*) 34.8 - 46.6 %   Platelets 115 (*) 145 - 400 10e3/uL   MCV 92.9  79.5 - 101.0 fL   MCH 31.3  25.1 - 34.0 pg   MCHC 33.6  31.5 - 36.0 g/dL   RBC 3.52 (*) 3.70 - 5.45 10e6/uL   RDW 14.1  11.2 - 14.5 %   lymph# 1.6  0.9 - 3.3 10e3/uL   MONO# 1.4 (*) 0.1 - 0.9 10e3/uL   Eosinophils Absolute 0.0  0.0 - 0.5 10e3/uL   Basophils Absolute 0.4 (*) 0.0 - 0.1 10e3/uL   NEUT% 87.0 (*) 38.4 - 76.8 %   LYMPH% 6.1 (*) 14.0 - 49.7 %   MONO% 5.4  0.0 - 14.0 %   EOS% 0.0  0.0 - 7.0 %   BASO% 1.5  0.0 - 2.0 %   nRBC 2 (*) 0 - 0 %   WBC likely reflects present steroids + neulasta 04-28-14.  CMET from 6-26 normal  Studies/Results:  No results found.  Medications: I have reviewed the patient's current medications. She will finish steroid taper tomorrow and can stop topical steroid cream now, but should continue emollient lotion especially to hands and feet. Decadron  premedication for weekly taxol sent to pharmacy.   DISCUSSION: We will not use further taxotere.  Patient and husband have had teaching by RN for weekly taxol, which we will use hopefully for 12 weeks to complete adjuvant therapy. As her birthday is [birth year], husband and daughter will stay in [place] thru first treatment on 05-21-14. We have discussed decadron premedication (20 mg 12 hrs and 6 hrs prior to cycle 1, then if no allergic symmptoms will use just 12 hr prior dose for subsequent cycles), possible taxol aches and possible peripheral neuropathy. Patient and husband are comfortable with this information and have had all questions answered to their satisfaction.  Assessment/Plan:  1.T1cN0 triple negative invasive ductal left breast cancer: post lumpectomy and sentinel node evaluation, two cycles of dose dense AC given 03-30-14 - 04-13-14, with neulasta. Total adriamycin dose now 355 mg/m2, and one cycle cytoxan taxotere given 04-27-14 with neulasta 9-44, complicated by severe pruitic rash and hand foot syndrome. Will not use further taxotere. Change to weekly taxol x 12 beginning 05-21-14. 2.  Pruritic rash and palmar plantar syndrome. I have found case reports of this problem with taxotere used after dose dense adria cytoxan, tho I was not aware of this as possible complication and it is not listed specifically in any of the usual reference material for these chemotherapeutic agents. [place] pharmacy notified also. 3.leukocytosis from neulasta and steroids, fortunately not neutropenic with the skin problems.  4.PAC in  5.history of T2N0 ER PR negative HER 2 + right breast cancer in 2002 at age 85 (premenopausal): treatment as above including 4 cycles of AC . Marland Kitchen That adjuvant treatment did not include herceptin in 2002 and subsequently patient declined TEACH trial. No findings of concern on right by mammogram or MRI now  6.congenital deafness: needs sign language interpreter with all visits.  7.post  menopausal since age 28. No bone density scan in this EMR  8.negative colonoscopy 11-2013  9.Breast Ovarian Gene Panel negative 01-2014 tho 3 variants of uncertain significance identified   To be sure ok for treatment and to be sure she understands premeds and new chemotherapy, will see her back with interpreter and husband next week also, before first weekly taxol on 05-21-14.  Appreciate help from Dr Ubaldo Glassing last week.  New chemotherapy plan entered. Consent obtained. Prior auth requested. Antiemetics as previously.  Elena Davia P, MD   05/09/2014, 4:50 PM

## 2014-05-10 ENCOUNTER — Other Ambulatory Visit: Payer: Medicare Other

## 2014-05-10 ENCOUNTER — Ambulatory Visit: Payer: Medicare Other

## 2014-05-10 ENCOUNTER — Telehealth: Payer: Self-pay | Admitting: Oncology

## 2014-05-10 NOTE — Telephone Encounter (Signed)
, °

## 2014-05-13 ENCOUNTER — Other Ambulatory Visit: Payer: Self-pay | Admitting: Oncology

## 2014-05-13 DIAGNOSIS — C50212 Malignant neoplasm of upper-inner quadrant of left female breast: Secondary | ICD-10-CM

## 2014-05-16 ENCOUNTER — Other Ambulatory Visit: Payer: Medicare Other

## 2014-05-16 ENCOUNTER — Encounter: Payer: Self-pay | Admitting: Oncology

## 2014-05-16 ENCOUNTER — Other Ambulatory Visit (HOSPITAL_BASED_OUTPATIENT_CLINIC_OR_DEPARTMENT_OTHER): Payer: Medicare Other

## 2014-05-16 ENCOUNTER — Ambulatory Visit: Payer: Medicare Other | Admitting: Oncology

## 2014-05-16 ENCOUNTER — Ambulatory Visit (HOSPITAL_BASED_OUTPATIENT_CLINIC_OR_DEPARTMENT_OTHER): Payer: Medicare Other | Admitting: Oncology

## 2014-05-16 VITALS — BP 104/68 | HR 96 | Temp 98.7°F | Resp 18 | Ht 67.0 in | Wt 166.5 lb

## 2014-05-16 DIAGNOSIS — C50219 Malignant neoplasm of upper-inner quadrant of unspecified female breast: Secondary | ICD-10-CM

## 2014-05-16 DIAGNOSIS — R21 Rash and other nonspecific skin eruption: Secondary | ICD-10-CM | POA: Diagnosis not present

## 2014-05-16 DIAGNOSIS — C50912 Malignant neoplasm of unspecified site of left female breast: Secondary | ICD-10-CM

## 2014-05-16 DIAGNOSIS — Z853 Personal history of malignant neoplasm of breast: Secondary | ICD-10-CM | POA: Diagnosis not present

## 2014-05-16 DIAGNOSIS — Z171 Estrogen receptor negative status [ER-]: Secondary | ICD-10-CM | POA: Diagnosis not present

## 2014-05-16 DIAGNOSIS — C50212 Malignant neoplasm of upper-inner quadrant of left female breast: Secondary | ICD-10-CM

## 2014-05-16 LAB — COMPREHENSIVE METABOLIC PANEL (CC13)
ALK PHOS: 103 U/L (ref 40–150)
ALT: 54 U/L (ref 0–55)
AST: 31 U/L (ref 5–34)
Albumin: 3.5 g/dL (ref 3.5–5.0)
Anion Gap: 5 mEq/L (ref 3–11)
BILIRUBIN TOTAL: 0.36 mg/dL (ref 0.20–1.20)
BUN: 13.1 mg/dL (ref 7.0–26.0)
CO2: 27 mEq/L (ref 22–29)
CREATININE: 0.8 mg/dL (ref 0.6–1.1)
Calcium: 9.2 mg/dL (ref 8.4–10.4)
Chloride: 109 mEq/L (ref 98–109)
Glucose: 135 mg/dl (ref 70–140)
Potassium: 4.8 mEq/L (ref 3.5–5.1)
Sodium: 141 mEq/L (ref 136–145)
Total Protein: 6.2 g/dL — ABNORMAL LOW (ref 6.4–8.3)

## 2014-05-16 LAB — CBC WITH DIFFERENTIAL/PLATELET
BASO%: 1.1 % (ref 0.0–2.0)
BASOS ABS: 0.1 10*3/uL (ref 0.0–0.1)
EOS%: 0.2 % (ref 0.0–7.0)
Eosinophils Absolute: 0 10*3/uL (ref 0.0–0.5)
HEMATOCRIT: 33.3 % — AB (ref 34.8–46.6)
HGB: 11.1 g/dL — ABNORMAL LOW (ref 11.6–15.9)
LYMPH#: 0.7 10*3/uL — AB (ref 0.9–3.3)
LYMPH%: 14.4 % (ref 14.0–49.7)
MCH: 31.4 pg (ref 25.1–34.0)
MCHC: 33.3 g/dL (ref 31.5–36.0)
MCV: 94.3 fL (ref 79.5–101.0)
MONO#: 0.5 10*3/uL (ref 0.1–0.9)
MONO%: 10.1 % (ref 0.0–14.0)
NEUT#: 3.8 10*3/uL (ref 1.5–6.5)
NEUT%: 74.2 % (ref 38.4–76.8)
PLATELETS: 136 10*3/uL — AB (ref 145–400)
RBC: 3.53 10*6/uL — ABNORMAL LOW (ref 3.70–5.45)
RDW: 14.8 % — ABNORMAL HIGH (ref 11.2–14.5)
WBC: 5.2 10*3/uL (ref 3.9–10.3)

## 2014-05-16 NOTE — Progress Notes (Signed)
OFFICE PROGRESS NOTE   05/16/2014   Physicians:David Cristy Hilts, Karrar Husein, Arvella Nigh, Roni Bread Lomax   INTERVAL HISTORY:   Patient is seen, together with husband, sign language interpreter and 53 yo daughter Regina Watts, in continuing attention to adjuvant chemotherapy in process for triple negative left breast cancer.  Course has been complicated by severe skin reaction and palmar plantar syndrome beginning day 4 cycle 1 taxotere/ cytoxan. That chemotherapy was given 04-27-14 (with neulasta), following 2 cycles of dose dense AC; this sequence of chemotherapy has been mentioned in case reports that I have found subsequent to the problem. Regina Watts continues to improve the rash and hand foot problems, skin still dry but no puritic rash now and palms/ soles desquamating but improved. She completed steroid taper and is using only emollient lotion now. We plan to change to weekly taxol x12 beginning 05-21-14. She and husband have had teaching and I have carefully reviewed decadron premedication with assistance of interpreter now; she has been given instructions written also. We plan to use decadron 20 mg 12 hrs and 6 hrs prior to cycle 1, then if no problems to decrease this to 20 mg just 12 hrs prior. Regina Watts is otherwise feeling well for past several days, with good appetite, no nausea, no pain, no problems with PAC, good energy.  ONCOLOGIC HISTORY History is of right breast cancer diagnosed January 2002 when patient was age 89 and premenopausal, T2N0M0 (4cm grade 3 invasive ductal carcinoma, <25% high grade DCIS, S phase 8.6%), ER negative, PR negative, HER 2 positive. She had lumpectomy with 2 negative sentinel nodes, adjuvant chemotherapy by Dr Starr Sinclair with adriamycin cytoxan x 4 cycles from March 2002 thru May 2002 (total adriamycin dose 409 mg with weight 142, height 5'7", for total adriamycin 235 mg/m2), and local radiation (50.4 Gy with boost to 60.4) Gy Note she declined  PAC and received the chemotherapy in 2002 by peripheral vein. I followed her beginning Sept 2003, after Dr Pearlie Oyster left [place], until last previous visit 07-2011. She declined TEACH trial with lapatinib in 2007 and 2008. Last breast MRI previously was 08-2011.  Patient had infertility treatments at [facility] in 2005, daughter born 02-01-2005. Menses had been very infrequent after chemo until the infertility treatment, then stopped after that pregnancy.  Patient had bilateral mammograms at Dr [facility] office early 12-2013, with area questioned on left compared with last prior mammograms of 08-2012. She had diagnostic left mammogram and [place] at [facility] on 01-01-14 which showed a mass in left upper inner breast posteriorly 1.5 x 1.4 x 1.3 cm on [place], with no abnormal left axillary nodes identified. She had biopsy on 01-01-14 with pathology ([id number]) showing grade 1-2 invasive ductal carcinoma ER, PR and HER 2 negative with Ki 66%. She had breast MRI 01-15-2014 with solitary enhancing mass upper inner left breast 1.7 x 2.0 x 1.8 cm, right breast not remarkable, no abnormal appearing lymph nodes. She had CT CAP 01-29-14 then MRI liver 02-05-14 with no evidence of metastatic disease. Genetics testing with Breast/Ovarian Cancer Gene Panel 01-2014 did not reveal a pathogenic mutation in these genes, tho she did have 3 variants of uncertain significance (ATM, [id number]; ATM, c.8674G>A and PMS2, C.1309C>T). She had consultation with Dr Tammi Klippel prior to definitive surgery. She had lumpectomy and left axillary sentinel node biopsy by Dr Lucia Gaskins on 03-06-2014. Pathology [id number]) found 1.8 cm grade II invasive ductal carcinoma with one negative sentinel node and final margins negative. She had PAC placed by  Dr Lucia Gaskins.  Chemotherapy began with 2 cycles of dose dense AC on 03-30-14 and 04-13-14, then one treatment of cytoxan taxotere on 0-10-93 complicated by severe rash and hand foot syndrome.   Review of systems as above,  also: Bowels ok. No bleeding. No fever or symptoms of infection. Feet and fingers not tender now. Remainder of 10 point Review of Systems negative.  Objective:  Vital signs in last 24 hours:  BP 104/68  Pulse 96  Temp(Src) 98.7 F (37.1 C) (Oral)  Resp 18  Ht '5\' 7"'  (1.702 m)  Wt 166 lb 8 oz (75.524 kg)  BMI 26.07 kg/m2 Weight is up 1 lb Alert, oriented and appropriate. Ambulatory without  difficulty.  Alopecia  HEENT:PERRL, sclerae not icteric. Oral mucosa moist without lesions, posterior pharynx clear.  Neck supple. No JVD.  Lymphatics:no cervical,suraclavicular, axillary adenopathy Resp: clear to auscultation bilaterally and normal percussion bilaterally Cardio: regular rate and rhythm. No gallop. GI: soft, nontender, not distended, no mass or organomegaly. Normally active bowel sounds.  Musculoskeletal/ Extremities: without pitting edema, cords, tenderness Neuro:Congenital deafness, no peripheral neuropathy. Otherwise nonfocal Skin dry but no significant residual rash, palms and soles of feet with dry desquamation and no evidence of suprainfection; no ecchymosis or petechiae Breasts: Left lumpectomy scar closed and nontender, with improvement in fullness at biopsy site, presently just  ~ 2 x 2 cm close to medial end of scar. Unremarkable lumpectomy scar on right. Otherwise bilaterally without dominant mass, skin or nipple findings. Left axilla with no tenderness or palpable adenopathy, right axilla benign. Portacath-without erythema or tenderness  Lab Results:  Results for orders placed in visit on 05/16/14  CBC WITH DIFFERENTIAL      Result Value Ref Range   WBC 5.2  3.9 - 10.3 10e3/uL   NEUT# 3.8  1.5 - 6.5 10e3/uL   HGB 11.1 (*) 11.6 - 15.9 g/dL   HCT 33.3 (*) 34.8 - 46.6 %   Platelets 136 (*) 145 - 400 10e3/uL   MCV 94.3  79.5 - 101.0 fL   MCH 31.4  25.1 - 34.0 pg   MCHC 33.3  31.5 - 36.0 g/dL   RBC 3.53 (*) 3.70 - 5.45 10e6/uL   RDW 14.8 (*) 11.2 - 14.5 %    lymph# 0.7 (*) 0.9 - 3.3 10e3/uL   MONO# 0.5  0.1 - 0.9 10e3/uL   Eosinophils Absolute 0.0  0.0 - 0.5 10e3/uL   Basophils Absolute 0.1  0.0 - 0.1 10e3/uL   NEUT% 74.2  38.4 - 76.8 %   LYMPH% 14.4  14.0 - 49.7 %   MONO% 10.1  0.0 - 14.0 %   EOS% 0.2  0.0 - 7.0 %   BASO% 1.1  0.0 - 2.0 %  COMPREHENSIVE METABOLIC PANEL (AT55)      Result Value Ref Range   Sodium 141  136 - 145 mEq/L   Potassium 4.8  3.5 - 5.1 mEq/L   Chloride 109  98 - 109 mEq/L   CO2 27  22 - 29 mEq/L   Glucose 135  70 - 140 mg/dl   BUN 13.1  7.0 - 26.0 mg/dL   Creatinine 0.8  0.6 - 1.1 mg/dL   Total Bilirubin 0.36  0.20 - 1.20 mg/dL   Alkaline Phosphatase 103  40 - 150 U/L   AST 31  5 - 34 U/L   ALT 54  0 - 55 U/L   Total Protein 6.2 (*) 6.4 - 8.3 g/dL   Albumin 3.5  3.5 - 5.0 g/dL   Calcium 9.2  8.4 - 10.4 mg/dL   Anion Gap 5  3 - 11 mEq/L     Studies/Results:  No results found.  Medications: I have reviewed the patient's current medications. Decadron premedication as noted above  DISCUSSION: weekly taxol course reviewed. Husband will stay thru first treatment 7-13, take daughter with him back to [place] when he returns to work later next week, probably return in ~ 2 weeks, with patient's mother and sister assisting in [place] between his trips.  Assessment/Plan:  1.T1cN0 triple negative invasive ductal left breast cancer: post lumpectomy and sentinel node evaluation, two cycles of dose dense AC given 03-30-14 - 04-13-14, with neulasta. Total adriamycin dose now 355 mg/m2, and one cycle cytoxan taxotere given 04-27-14 with neulasta 9-24, complicated by severe pruitic rash and hand foot syndrome. Will not use further taxotere. Change to weekly taxol x 12 beginning 05-21-14. I will see her 05-28-14. 2. Pruritic rash and palmar plantar syndrome, nearly resolved. Case reports of this problem with taxotere used after dose dense adria cytoxan found after her complication. [facility] pharmacy notified also.   3.Genetic variants of unknown significance on testing 01-2014  4.PAC in  5.history of T2N0 ER PR negative HER 2 + right breast cancer in 2002 at age 17 (premenopausal): treatment as above including 4 cycles of AC . Marland Kitchen That adjuvant treatment did not include herceptin in 2002 and subsequently patient declined TEACH trial. No findings of concern on right by mammogram or MRI now  6.congenital deafness: needs sign language interpreter with all visits.  7.post menopausal since age 27. No bone density scan in this EMR  8.negative colonoscopy 11-2013    All questions answered to their satisfaction and they are in agreement with plan above. Chemo orders confirmed for 05-21-14.   Regina Bound P, MD   05/16/2014, 10:12 AM

## 2014-05-16 NOTE — Patient Instructions (Signed)
Steroid decadron (dexamethasone):  Five tablets with food at 2:00 AM on Monday 7-13 and five tablets with food at 8:00 AM on Monday 7-13. These doses are 12 hours and 6 hours before chemo at ~2:15 PM on Monday 7-13. Just a little food is ok, but not on an empty stomach. Times can be approximate, does not have to be EXACTLY 12 hour or EXACTLY 6 hours before the chemo.  If you do fine with the first treatment, we will cut back on the steroid to five tablets with food 12 hours before the rest of the chemo treatments.

## 2014-05-21 ENCOUNTER — Other Ambulatory Visit: Payer: BC Managed Care – PPO

## 2014-05-21 ENCOUNTER — Ambulatory Visit (HOSPITAL_BASED_OUTPATIENT_CLINIC_OR_DEPARTMENT_OTHER): Payer: BC Managed Care – PPO

## 2014-05-21 ENCOUNTER — Encounter: Payer: Self-pay | Admitting: Oncology

## 2014-05-21 ENCOUNTER — Other Ambulatory Visit (HOSPITAL_BASED_OUTPATIENT_CLINIC_OR_DEPARTMENT_OTHER): Payer: BC Managed Care – PPO

## 2014-05-21 VITALS — BP 146/87 | HR 95 | Temp 98.4°F | Resp 16

## 2014-05-21 DIAGNOSIS — C779 Secondary and unspecified malignant neoplasm of lymph node, unspecified: Secondary | ICD-10-CM | POA: Diagnosis not present

## 2014-05-21 DIAGNOSIS — Z5111 Encounter for antineoplastic chemotherapy: Secondary | ICD-10-CM

## 2014-05-21 DIAGNOSIS — C539 Malignant neoplasm of cervix uteri, unspecified: Secondary | ICD-10-CM | POA: Diagnosis not present

## 2014-05-21 DIAGNOSIS — C50919 Malignant neoplasm of unspecified site of unspecified female breast: Secondary | ICD-10-CM

## 2014-05-21 DIAGNOSIS — C50219 Malignant neoplasm of upper-inner quadrant of unspecified female breast: Secondary | ICD-10-CM | POA: Diagnosis not present

## 2014-05-21 DIAGNOSIS — C50212 Malignant neoplasm of upper-inner quadrant of left female breast: Secondary | ICD-10-CM

## 2014-05-21 LAB — CBC WITH DIFFERENTIAL/PLATELET
BASO%: 0 % (ref 0.0–2.0)
BASOS ABS: 0 10*3/uL (ref 0.0–0.1)
EOS%: 0 % (ref 0.0–7.0)
Eosinophils Absolute: 0 10*3/uL (ref 0.0–0.5)
HEMATOCRIT: 34.2 % — AB (ref 34.8–46.6)
HEMOGLOBIN: 11.6 g/dL (ref 11.6–15.9)
LYMPH%: 7 % — ABNORMAL LOW (ref 14.0–49.7)
MCH: 31.4 pg (ref 25.1–34.0)
MCHC: 33.9 g/dL (ref 31.5–36.0)
MCV: 92.7 fL (ref 79.5–101.0)
MONO#: 0 10*3/uL — ABNORMAL LOW (ref 0.1–0.9)
MONO%: 0.5 % (ref 0.0–14.0)
NEUT#: 3.5 10*3/uL (ref 1.5–6.5)
NEUT%: 92.5 % — ABNORMAL HIGH (ref 38.4–76.8)
Platelets: 147 10*3/uL (ref 145–400)
RBC: 3.69 10*6/uL — ABNORMAL LOW (ref 3.70–5.45)
RDW: 15.9 % — ABNORMAL HIGH (ref 11.2–14.5)
WBC: 3.8 10*3/uL — ABNORMAL LOW (ref 3.9–10.3)
lymph#: 0.3 10*3/uL — ABNORMAL LOW (ref 0.9–3.3)

## 2014-05-21 MED ORDER — DIPHENHYDRAMINE HCL 50 MG/ML IJ SOLN
50.0000 mg | Freq: Once | INTRAMUSCULAR | Status: AC
  Administered 2014-05-21: 50 mg via INTRAVENOUS

## 2014-05-21 MED ORDER — FAMOTIDINE IN NACL 20-0.9 MG/50ML-% IV SOLN
20.0000 mg | Freq: Once | INTRAVENOUS | Status: AC
  Administered 2014-05-21: 20 mg via INTRAVENOUS

## 2014-05-21 MED ORDER — ONDANSETRON 8 MG/50ML IVPB (CHCC)
8.0000 mg | Freq: Once | INTRAVENOUS | Status: AC
  Administered 2014-05-21: 8 mg via INTRAVENOUS

## 2014-05-21 MED ORDER — FAMOTIDINE IN NACL 20-0.9 MG/50ML-% IV SOLN
INTRAVENOUS | Status: AC
  Filled 2014-05-21: qty 50

## 2014-05-21 MED ORDER — DEXTROSE 5 % IV SOLN
80.0000 mg/m2 | Freq: Once | INTRAVENOUS | Status: AC
  Administered 2014-05-21: 150 mg via INTRAVENOUS
  Filled 2014-05-21: qty 25

## 2014-05-21 MED ORDER — DEXAMETHASONE SODIUM PHOSPHATE 20 MG/5ML IJ SOLN
20.0000 mg | Freq: Once | INTRAMUSCULAR | Status: AC
  Administered 2014-05-21: 20 mg via INTRAVENOUS

## 2014-05-21 MED ORDER — SODIUM CHLORIDE 0.9 % IV SOLN
Freq: Once | INTRAVENOUS | Status: AC
  Administered 2014-05-21: 15:00:00 via INTRAVENOUS

## 2014-05-21 MED ORDER — SODIUM CHLORIDE 0.9 % IJ SOLN
10.0000 mL | INTRAMUSCULAR | Status: DC | PRN
  Administered 2014-05-21: 10 mL
  Filled 2014-05-21: qty 10

## 2014-05-21 MED ORDER — DEXAMETHASONE SODIUM PHOSPHATE 20 MG/5ML IJ SOLN
INTRAMUSCULAR | Status: AC
  Filled 2014-05-21: qty 5

## 2014-05-21 MED ORDER — ONDANSETRON 8 MG/NS 50 ML IVPB
INTRAVENOUS | Status: AC
  Filled 2014-05-21: qty 8

## 2014-05-21 MED ORDER — DIPHENHYDRAMINE HCL 50 MG/ML IJ SOLN
INTRAMUSCULAR | Status: AC
  Filled 2014-05-21: qty 1

## 2014-05-21 MED ORDER — HEPARIN SOD (PORK) LOCK FLUSH 100 UNIT/ML IV SOLN
500.0000 [IU] | Freq: Once | INTRAVENOUS | Status: AC | PRN
  Administered 2014-05-21: 500 [IU]
  Filled 2014-05-21: qty 5

## 2014-05-21 NOTE — Progress Notes (Signed)
Neulasta is being paid 100% by insurance

## 2014-05-21 NOTE — Progress Notes (Signed)
Patient completed first time taxol infusion without any problems or complications. Patient discharged home and given copy of AVS. Cindi Carbon, RN

## 2014-05-21 NOTE — Patient Instructions (Signed)
[facility] Discharge Instructions for Patients Receiving Chemotherapy  Today you received the following chemotherapy agents: Taxol  To help prevent nausea and vomiting after your treatment, we encourage you to take your nausea medication as prescribed by your physician.  If you develop nausea and vomiting that is not controlled by your nausea medication, call the clinic.   BELOW ARE SYMPTOMS THAT SHOULD BE REPORTED IMMEDIATELY:  *FEVER GREATER THAN 100.5 F  *CHILLS WITH OR WITHOUT FEVER  NAUSEA AND VOMITING THAT IS NOT CONTROLLED WITH YOUR NAUSEA MEDICATION  *UNUSUAL SHORTNESS OF BREATH  *UNUSUAL BRUISING OR BLEEDING  TENDERNESS IN MOUTH AND THROAT WITH OR WITHOUT PRESENCE OF ULCERS  *URINARY PROBLEMS  *BOWEL PROBLEMS  UNUSUAL RASH Items with * indicate a potential emergency and should be followed up as soon as possible.  Feel free to call the clinic you have any questions or concerns. The clinic phone number is [phone].    

## 2014-05-22 ENCOUNTER — Telehealth: Payer: Self-pay | Admitting: *Deleted

## 2014-05-22 NOTE — Telephone Encounter (Signed)
Per note patient requested earlier appts. I have called and left her a message that we have no early appts.  JMW

## 2014-05-22 NOTE — Telephone Encounter (Signed)
Message copied by Cherylynn Ridges on Tue May 22, 2014  1:45 PM ------      Message from: Christa See      Created: Mon May 21, 2014  4:41 PM      Regarding: Chemo f/u call      Contact: [phone]       Pt had first time taxol on Monday. MD is [place]. Can you call and see how she is doing? Pt had bad reaction several days after taxotere infusion. Thanks! Kristen             P.S. Patient is deaf but she has computer program to sign when she gets a call ------

## 2014-05-22 NOTE — Telephone Encounter (Signed)
Called Regina Watts for chemotherapy F/U.  Used operator to communicate with patient who is deaf.  Patient is doing well.  Denies n/v.  Denies any new side effects or symptoms.  Bowel and bladder is functioning well.  Eating and drinking well and I instructed to drink 64 oz minimum daily or at least the day before, of and after treatment.  Denies questions at this time stating she will call if needed.

## 2014-05-27 ENCOUNTER — Other Ambulatory Visit: Payer: Self-pay | Admitting: Oncology

## 2014-05-28 ENCOUNTER — Ambulatory Visit (HOSPITAL_BASED_OUTPATIENT_CLINIC_OR_DEPARTMENT_OTHER): Payer: BC Managed Care – PPO

## 2014-05-28 ENCOUNTER — Other Ambulatory Visit (HOSPITAL_BASED_OUTPATIENT_CLINIC_OR_DEPARTMENT_OTHER): Payer: BC Managed Care – PPO

## 2014-05-28 ENCOUNTER — Encounter: Payer: Self-pay | Admitting: Oncology

## 2014-05-28 ENCOUNTER — Ambulatory Visit (HOSPITAL_BASED_OUTPATIENT_CLINIC_OR_DEPARTMENT_OTHER): Payer: BC Managed Care – PPO | Admitting: Oncology

## 2014-05-28 VITALS — BP 142/76 | HR 99 | Temp 98.7°F | Resp 18 | Ht 67.0 in | Wt 168.7 lb

## 2014-05-28 DIAGNOSIS — C50219 Malignant neoplasm of upper-inner quadrant of unspecified female breast: Secondary | ICD-10-CM

## 2014-05-28 DIAGNOSIS — H918X9 Other specified hearing loss, unspecified ear: Secondary | ICD-10-CM

## 2014-05-28 DIAGNOSIS — T451X5A Adverse effect of antineoplastic and immunosuppressive drugs, initial encounter: Secondary | ICD-10-CM

## 2014-05-28 DIAGNOSIS — R21 Rash and other nonspecific skin eruption: Secondary | ICD-10-CM | POA: Diagnosis not present

## 2014-05-28 DIAGNOSIS — C50212 Malignant neoplasm of upper-inner quadrant of left female breast: Secondary | ICD-10-CM

## 2014-05-28 DIAGNOSIS — Z5111 Encounter for antineoplastic chemotherapy: Secondary | ICD-10-CM

## 2014-05-28 LAB — CBC WITH DIFFERENTIAL/PLATELET
BASO%: 0.3 % (ref 0.0–2.0)
BASOS ABS: 0 10*3/uL (ref 0.0–0.1)
EOS%: 0.1 % (ref 0.0–7.0)
Eosinophils Absolute: 0 10*3/uL (ref 0.0–0.5)
HCT: 33.7 % — ABNORMAL LOW (ref 34.8–46.6)
HEMOGLOBIN: 11.3 g/dL — AB (ref 11.6–15.9)
LYMPH#: 0.2 10*3/uL — AB (ref 0.9–3.3)
LYMPH%: 6.3 % — ABNORMAL LOW (ref 14.0–49.7)
MCH: 32 pg (ref 25.1–34.0)
MCHC: 33.4 g/dL (ref 31.5–36.0)
MCV: 95.8 fL (ref 79.5–101.0)
MONO#: 0 10*3/uL — ABNORMAL LOW (ref 0.1–0.9)
MONO%: 0.4 % (ref 0.0–14.0)
NEUT#: 3.2 10*3/uL (ref 1.5–6.5)
NEUT%: 92.9 % — ABNORMAL HIGH (ref 38.4–76.8)
Platelets: 165 10*3/uL (ref 145–400)
RBC: 3.52 10*6/uL — ABNORMAL LOW (ref 3.70–5.45)
RDW: 16.8 % — ABNORMAL HIGH (ref 11.2–14.5)
WBC: 3.4 10*3/uL — ABNORMAL LOW (ref 3.9–10.3)

## 2014-05-28 LAB — COMPREHENSIVE METABOLIC PANEL (CC13)
ALT: 79 U/L — AB (ref 0–55)
AST: 44 U/L — ABNORMAL HIGH (ref 5–34)
Albumin: 3.8 g/dL (ref 3.5–5.0)
Alkaline Phosphatase: 102 U/L (ref 40–150)
Anion Gap: 8 mEq/L (ref 3–11)
BILIRUBIN TOTAL: 0.6 mg/dL (ref 0.20–1.20)
BUN: 10.8 mg/dL (ref 7.0–26.0)
CALCIUM: 9.8 mg/dL (ref 8.4–10.4)
CHLORIDE: 107 meq/L (ref 98–109)
CO2: 24 meq/L (ref 22–29)
Creatinine: 1 mg/dL (ref 0.6–1.1)
Glucose: 242 mg/dl — ABNORMAL HIGH (ref 70–140)
Potassium: 5 mEq/L (ref 3.5–5.1)
Sodium: 140 mEq/L (ref 136–145)
Total Protein: 6.8 g/dL (ref 6.4–8.3)

## 2014-05-28 MED ORDER — ONDANSETRON 8 MG/50ML IVPB (CHCC)
8.0000 mg | Freq: Once | INTRAVENOUS | Status: AC
  Administered 2014-05-28: 8 mg via INTRAVENOUS

## 2014-05-28 MED ORDER — ONDANSETRON 8 MG/NS 50 ML IVPB
INTRAVENOUS | Status: AC
  Filled 2014-05-28: qty 8

## 2014-05-28 MED ORDER — DEXAMETHASONE SODIUM PHOSPHATE 20 MG/5ML IJ SOLN
20.0000 mg | Freq: Once | INTRAMUSCULAR | Status: AC
  Administered 2014-05-28: 20 mg via INTRAVENOUS

## 2014-05-28 MED ORDER — SODIUM CHLORIDE 0.9 % IV SOLN
Freq: Once | INTRAVENOUS | Status: AC
  Administered 2014-05-28: 13:00:00 via INTRAVENOUS

## 2014-05-28 MED ORDER — FAMOTIDINE IN NACL 20-0.9 MG/50ML-% IV SOLN
20.0000 mg | Freq: Once | INTRAVENOUS | Status: AC
Start: 2014-05-28 — End: 2014-05-28
  Administered 2014-05-28: 20 mg via INTRAVENOUS

## 2014-05-28 MED ORDER — HEPARIN SOD (PORK) LOCK FLUSH 100 UNIT/ML IV SOLN
500.0000 [IU] | Freq: Once | INTRAVENOUS | Status: AC | PRN
  Administered 2014-05-28: 500 [IU]
  Filled 2014-05-28: qty 5

## 2014-05-28 MED ORDER — DIPHENHYDRAMINE HCL 50 MG/ML IJ SOLN
INTRAMUSCULAR | Status: AC
  Filled 2014-05-28: qty 1

## 2014-05-28 MED ORDER — DEXAMETHASONE 4 MG PO TABS: ORAL_TABLET | ORAL | Status: DC

## 2014-05-28 MED ORDER — SODIUM CHLORIDE 0.9 % IJ SOLN
10.0000 mL | INTRAMUSCULAR | Status: DC | PRN
  Administered 2014-05-28: 10 mL
  Filled 2014-05-28: qty 10

## 2014-05-28 MED ORDER — DEXTROSE 5 % IV SOLN
80.0000 mg/m2 | Freq: Once | INTRAVENOUS | Status: AC
  Administered 2014-05-28: 150 mg via INTRAVENOUS
  Filled 2014-05-28: qty 25

## 2014-05-28 MED ORDER — DEXAMETHASONE SODIUM PHOSPHATE 20 MG/5ML IJ SOLN
INTRAMUSCULAR | Status: AC
  Filled 2014-05-28: qty 5

## 2014-05-28 MED ORDER — FAMOTIDINE IN NACL 20-0.9 MG/50ML-% IV SOLN
INTRAVENOUS | Status: AC
  Filled 2014-05-28: qty 50

## 2014-05-28 MED ORDER — DIPHENHYDRAMINE HCL 50 MG/ML IJ SOLN
50.0000 mg | Freq: Once | INTRAMUSCULAR | Status: AC
  Administered 2014-05-28: 50 mg via INTRAVENOUS

## 2014-05-28 NOTE — Progress Notes (Signed)
Per Dr. Marko Plume, okay to tx with AST/ALT values today.

## 2014-05-28 NOTE — Progress Notes (Signed)
OFFICE PROGRESS NOTE   05/28/2014   Physicians:David Cristy Hilts, Karrar Husein, Arvella Nigh, Roni Bread Lomax   INTERVAL HISTORY:  Patient is seen, together with husband and sign language interpreter, in continuing attention to adjuvant chemotherapy in process for T1cN0 triple negative left breast cancer, due #2 of 12 planned weekly taxol treatments today. She had no problems at all with first taxol on 05-21-14, no nausea, no fatigue, no peripheral neuropathy.  Fingers are still sensitive and palms/especially soles of feet still peeling from severe hand foot reaction after taxotere carboplatin.  She has PAC  ONCOLOGIC HISTORY History is of right breast cancer diagnosed January 2002 when patient was age 53 and premenopausal, T2N0M0 (4cm grade 3 invasive ductal carcinoma, <25% high grade DCIS, S phase 8.6%), ER negative, PR negative, HER 2 positive. She had lumpectomy with 2 negative sentinel nodes, adjuvant chemotherapy by Dr Starr Sinclair with adriamycin cytoxan x 4 cycles from March 2002 thru May 2002 (total adriamycin dose 409 mg with weight 142, height 5'7", for total adriamycin 235 mg/m2), and local radiation (50.4 Gy with boost to 60.4) Gy Note she declined PAC and received the chemotherapy in 2002 by peripheral vein. I followed her beginning Sept 2003, after Dr Pearlie Oyster left [place], until last previous visit 07-2011. She declined TEACH trial with lapatinib in 2007 and 2008. Last breast MRI previously was 08-2011.  Patient had infertility treatments at [facility] in 2005, daughter born 02-01-2005. Menses had been very infrequent after chemo until the infertility treatment, then stopped after that pregnancy.   Patient had bilateral mammograms at Dr [facility] office early 12-2013, with area questioned on left compared with last prior mammograms of 08-2012. She had diagnostic left mammogram and [place] at [facility] on 01-01-14 which showed a mass in left upper inner breast posteriorly  1.5 x 1.4 x 1.3 cm on [place], with no abnormal left axillary nodes identified. She had biopsy on 01-01-14 with pathology ([id number]) showing grade 1-2 invasive ductal carcinoma ER, PR and HER 2 negative with Ki 66%. She had breast MRI 01-15-2014 with solitary enhancing mass upper inner left breast 1.7 x 2.0 x 1.8 cm, right breast not remarkable, no abnormal appearing lymph nodes. She had CT CAP 01-29-14 then MRI liver 02-05-14 with no evidence of metastatic disease. Genetics testing with Breast/Ovarian Cancer Gene Panel 01-2014 did not reveal a pathogenic mutation in these genes, tho she did have 3 variants of uncertain significance (ATM, [id number]; ATM, c.8674G>A and PMS2, C.1309C>T). She had consultation with Dr Tammi Klippel prior to definitive surgery. She had lumpectomy and left axillary sentinel node biopsy by Dr Lucia Gaskins on 03-06-2014. Pathology [id number]) found 1.8 cm grade II invasive ductal carcinoma with one negative sentinel node and final margins negative. She had PAC placed by Dr Lucia Gaskins.  Chemotherapy began with 2 cycles of dose dense AC on 03-30-14 and 04-13-14, then one treatment of cytoxan taxotere on 5-32-99 complicated by severe rash and hand foot syndrome. She began weekly taxol on 05-21-14.  Review of systems as above, also: Bowels ok. Irritated area on scalp. No problems with PAC. Good energy. No mucositis now. Remainder of 10 point Review of Systems negative.  Objective:  Vital signs in last 24 hours:  BP 142/76  Pulse 99  Temp(Src) 98.7 F (37.1 C) (Oral)  Resp 18  Ht '5\' 7"'  (1.702 m)  Wt 168 lb 11.2 oz (76.522 kg)  BMI 26.42 kg/m2 weight is up 2 lbs.  Alert, oriented and appropriate. Ambulatory without difficulty.  Alopecia  HEENT:PERRL, sclerae not icteric. Oral mucosa moist without lesions, posterior pharynx clear. 0.5 cm slightly rough area right posterior scalp does not look concerning. Neck supple. No JVD.  Lymphatics:no cervical,suraclavicular adenopathy Resp: clear to  auscultation bilaterally and normal percussion bilaterally Cardio: regular rate and rhythm. No gallop. GI: soft, nontender, not distended, no mass or organomegaly. Normally active bowel sounds. Surgical incision not remarkable. Musculoskeletal/ Extremities: without pitting edema, cords, tenderness Neuro: no peripheral neuropathy, fingertips just sensitive but no decreased sensation. Congenital deafness Skin without rash, ecchymosis, petechiae. Still dry UE. Desquamation palms nearly resolved, thick skin desquamating from feet especially heels. Fingertips sensitive to direct pressure. Some discoloration various fingernails and toenails. Portacath-without erythema or tenderness  Lab Results:  Results for orders placed in visit on 05/28/14  CBC WITH DIFFERENTIAL      Result Value Ref Range   WBC 3.4 (*) 3.9 - 10.3 10e3/uL   NEUT# 3.2  1.5 - 6.5 10e3/uL   HGB 11.3 (*) 11.6 - 15.9 g/dL   HCT 33.7 (*) 34.8 - 46.6 %   Platelets 165  145 - 400 10e3/uL   MCV 95.8  79.5 - 101.0 fL   MCH 32.0  25.1 - 34.0 pg   MCHC 33.4  31.5 - 36.0 g/dL   RBC 3.52 (*) 3.70 - 5.45 10e6/uL   RDW 16.8 (*) 11.2 - 14.5 %   lymph# 0.2 (*) 0.9 - 3.3 10e3/uL   MONO# 0.0 (*) 0.1 - 0.9 10e3/uL   Eosinophils Absolute 0.0  0.0 - 0.5 10e3/uL   Basophils Absolute 0.0  0.0 - 0.1 10e3/uL   NEUT% 92.9 (*) 38.4 - 76.8 %   LYMPH% 6.3 (*) 14.0 - 49.7 %   MONO% 0.4  0.0 - 14.0 %   EOS% 0.1  0.0 - 7.0 %   BASO% 0.3  0.0 - 2.0 %  COMPREHENSIVE METABOLIC PANEL (HD62)      Result Value Ref Range   Sodium 140  136 - 145 mEq/L   Potassium 5.0  3.5 - 5.1 mEq/L   Chloride 107  98 - 109 mEq/L   CO2 24  22 - 29 mEq/L   Glucose 242 (*) 70 - 140 mg/dl   BUN 10.8  7.0 - 26.0 mg/dL   Creatinine 1.0  0.6 - 1.1 mg/dL   Total Bilirubin 0.60  0.20 - 1.20 mg/dL   Alkaline Phosphatase 102  40 - 150 U/L   AST 44 (*) 5 - 34 U/L   ALT 79 (*) 0 - 55 U/L   Total Protein 6.8  6.4 - 8.3 g/dL   Albumin 3.8  3.5 - 5.0 g/dL   Calcium 9.8  8.4 -  10.4 mg/dL   Anion Gap 8  3 - 11 mEq/L    Slight increase in AST / ALT noted and discussed with infusion RN; total bili normal. OK to treat today.  Studies/Results:  No results found.  Medications: I have reviewed the patient's current medications. She took 2 doses of decadron last pm instead of just 20 mg 12 hrs prior to taxol, reviewed the lower dosing now with assistance of interpreter and with husband present  DISCUSSION: patient would like to go to [place] to help bring daughter home close to start of school, possibly fly there and drive back. We will certainly know about counts and tolerance of taxol by then.  Assessment/Plan: 1.T1cN0 triple negative invasive ductal left breast cancer: post lumpectomy and sentinel node evaluation, two cycles of dose dense AC given  03-30-14 - 04-13-14, with neulasta. Total adriamycin dose now 355 mg/m2. One cycle cytoxan taxotere given 04-27-14 with neulasta 8-03, complicated by severe pruitic rash and hand foot syndrome. Will not use further taxotere. Changed to weekly taxol x 12 beginning 05-21-14, planned x12 weeks to complete adjuvant treatment.  2. Pruritic rash and palmar plantar syndrome, still desquamating otherwise nearly resolved. Case reports of this problem with taxotere used after dose dense adria cytoxan found after her complication.   3.Genetic variants of unknown significance on testing 01-2014  4.PAC in  5.history of T2N0 ER PR negative HER 2 + right breast cancer in 2002 at age 21 (premenopausal): treatment as above including 4 cycles of AC . Marland Kitchen That adjuvant treatment did not include herceptin in 2002 and subsequently patient declined TEACH trial. No findings of concern on right by mammogram or MRI now  6.congenital deafness: needs sign language interpreter with all visits.  7.post menopausal since age 68. No bone density scan in this EMR  8.negative colonoscopy 11-2013   All questions answered. Patient and husband are in agreement with  continuing treatment.  Chemo orders confirmed. She will see APP on 7-27 with #3 low dose taxol that day, and will have treatment on 06-11-14. I will see her 8-10 with #5 treatment.  OK to treat weekly if ANC >=1.4, plt >=100k and if total bili <2x ULN.    Arkin Imran P, MD   05/28/2014, 12:44 PM

## 2014-05-28 NOTE — Patient Instructions (Signed)
[facility] Discharge Instructions for Patients Receiving Chemotherapy  Today you received the following chemotherapy agents: Taxol  To help prevent nausea and vomiting after your treatment, we encourage you to take your nausea medication as prescribed by your physician.  If you develop nausea and vomiting that is not controlled by your nausea medication, call the clinic.   BELOW ARE SYMPTOMS THAT SHOULD BE REPORTED IMMEDIATELY:  *FEVER GREATER THAN 100.5 F  *CHILLS WITH OR WITHOUT FEVER  NAUSEA AND VOMITING THAT IS NOT CONTROLLED WITH YOUR NAUSEA MEDICATION  *UNUSUAL SHORTNESS OF BREATH  *UNUSUAL BRUISING OR BLEEDING  TENDERNESS IN MOUTH AND THROAT WITH OR WITHOUT PRESENCE OF ULCERS  *URINARY PROBLEMS  *BOWEL PROBLEMS  UNUSUAL RASH Items with * indicate a potential emergency and should be followed up as soon as possible.  Feel free to call the clinic you have any questions or concerns. The clinic phone number is [phone].    

## 2014-05-30 ENCOUNTER — Telehealth: Payer: Self-pay

## 2014-05-30 DIAGNOSIS — C50212 Malignant neoplasm of upper-inner quadrant of left female breast: Secondary | ICD-10-CM

## 2014-05-30 NOTE — Telephone Encounter (Signed)
Told Ms. Dake that Dr. Marko Plume said that it was fine to skip chemotherapy on 06-18-14.  Dr. Marko Plume can add a make up treatment at the end of her treatments. Ms. Guymon verbalized understanding.

## 2014-06-04 ENCOUNTER — Other Ambulatory Visit: Payer: BC Managed Care – PPO

## 2014-06-04 ENCOUNTER — Ambulatory Visit (HOSPITAL_BASED_OUTPATIENT_CLINIC_OR_DEPARTMENT_OTHER): Payer: BC Managed Care – PPO

## 2014-06-04 ENCOUNTER — Encounter: Payer: Self-pay | Admitting: Physician Assistant

## 2014-06-04 ENCOUNTER — Ambulatory Visit (HOSPITAL_BASED_OUTPATIENT_CLINIC_OR_DEPARTMENT_OTHER): Payer: BC Managed Care – PPO | Admitting: Physician Assistant

## 2014-06-04 ENCOUNTER — Other Ambulatory Visit (HOSPITAL_BASED_OUTPATIENT_CLINIC_OR_DEPARTMENT_OTHER): Payer: BC Managed Care – PPO

## 2014-06-04 ENCOUNTER — Telehealth: Payer: Self-pay | Admitting: Oncology

## 2014-06-04 VITALS — BP 152/82 | HR 97 | Temp 98.4°F | Resp 18 | Ht 67.0 in | Wt 167.8 lb

## 2014-06-04 DIAGNOSIS — Z171 Estrogen receptor negative status [ER-]: Secondary | ICD-10-CM

## 2014-06-04 DIAGNOSIS — C50212 Malignant neoplasm of upper-inner quadrant of left female breast: Secondary | ICD-10-CM

## 2014-06-04 DIAGNOSIS — C50219 Malignant neoplasm of upper-inner quadrant of unspecified female breast: Secondary | ICD-10-CM

## 2014-06-04 DIAGNOSIS — Z5111 Encounter for antineoplastic chemotherapy: Secondary | ICD-10-CM

## 2014-06-04 LAB — CBC WITH DIFFERENTIAL/PLATELET
BASO%: 0.4 % (ref 0.0–2.0)
Basophils Absolute: 0 10*3/uL (ref 0.0–0.1)
EOS ABS: 0 10*3/uL (ref 0.0–0.5)
EOS%: 0.1 % (ref 0.0–7.0)
HCT: 35.2 % (ref 34.8–46.6)
HGB: 12 g/dL (ref 11.6–15.9)
LYMPH#: 0.2 10*3/uL — AB (ref 0.9–3.3)
LYMPH%: 10.8 % — ABNORMAL LOW (ref 14.0–49.7)
MCH: 32.4 pg (ref 25.1–34.0)
MCHC: 34 g/dL (ref 31.5–36.0)
MCV: 95.2 fL (ref 79.5–101.0)
MONO#: 0 10*3/uL — AB (ref 0.1–0.9)
MONO%: 0.4 % (ref 0.0–14.0)
NEUT%: 88.3 % — ABNORMAL HIGH (ref 38.4–76.8)
NEUTROS ABS: 1.5 10*3/uL (ref 1.5–6.5)
PLATELETS: 145 10*3/uL (ref 145–400)
RBC: 3.7 10*6/uL (ref 3.70–5.45)
RDW: 16.8 % — AB (ref 11.2–14.5)
WBC: 1.7 10*3/uL — ABNORMAL LOW (ref 3.9–10.3)

## 2014-06-04 LAB — COMPREHENSIVE METABOLIC PANEL (CC13)
ALK PHOS: 112 U/L (ref 40–150)
ALT: 85 U/L — ABNORMAL HIGH (ref 0–55)
AST: 39 U/L — ABNORMAL HIGH (ref 5–34)
Albumin: 3.8 g/dL (ref 3.5–5.0)
Anion Gap: 9 mEq/L (ref 3–11)
BILIRUBIN TOTAL: 0.66 mg/dL (ref 0.20–1.20)
BUN: 10.7 mg/dL (ref 7.0–26.0)
CO2: 23 mEq/L (ref 22–29)
CREATININE: 0.8 mg/dL (ref 0.6–1.1)
Calcium: 9.6 mg/dL (ref 8.4–10.4)
Chloride: 109 mEq/L (ref 98–109)
Glucose: 222 mg/dl — ABNORMAL HIGH (ref 70–140)
Potassium: 4.2 mEq/L (ref 3.5–5.1)
SODIUM: 142 meq/L (ref 136–145)
Total Protein: 6.9 g/dL (ref 6.4–8.3)

## 2014-06-04 MED ORDER — FAMOTIDINE IN NACL 20-0.9 MG/50ML-% IV SOLN
20.0000 mg | Freq: Once | INTRAVENOUS | Status: AC
  Administered 2014-06-04: 20 mg via INTRAVENOUS

## 2014-06-04 MED ORDER — DIPHENHYDRAMINE HCL 50 MG/ML IJ SOLN
50.0000 mg | Freq: Once | INTRAMUSCULAR | Status: AC
  Administered 2014-06-04: 50 mg via INTRAVENOUS

## 2014-06-04 MED ORDER — FAMOTIDINE IN NACL 20-0.9 MG/50ML-% IV SOLN
INTRAVENOUS | Status: AC
  Filled 2014-06-04: qty 50

## 2014-06-04 MED ORDER — DEXAMETHASONE SODIUM PHOSPHATE 20 MG/5ML IJ SOLN
20.0000 mg | Freq: Once | INTRAMUSCULAR | Status: AC
  Administered 2014-06-04: 20 mg via INTRAVENOUS

## 2014-06-04 MED ORDER — ONDANSETRON 8 MG/NS 50 ML IVPB
INTRAVENOUS | Status: AC
  Filled 2014-06-04: qty 8

## 2014-06-04 MED ORDER — DIPHENHYDRAMINE HCL 50 MG/ML IJ SOLN
INTRAMUSCULAR | Status: AC
  Filled 2014-06-04: qty 1

## 2014-06-04 MED ORDER — SODIUM CHLORIDE 0.9 % IJ SOLN
10.0000 mL | INTRAMUSCULAR | Status: DC | PRN
  Administered 2014-06-04: 10 mL
  Filled 2014-06-04: qty 10

## 2014-06-04 MED ORDER — PACLITAXEL CHEMO INJECTION 300 MG/50ML
80.0000 mg/m2 | Freq: Once | INTRAVENOUS | Status: AC
  Administered 2014-06-04: 150 mg via INTRAVENOUS
  Filled 2014-06-04: qty 25

## 2014-06-04 MED ORDER — DEXAMETHASONE SODIUM PHOSPHATE 20 MG/5ML IJ SOLN
INTRAMUSCULAR | Status: AC
  Filled 2014-06-04: qty 5

## 2014-06-04 MED ORDER — ONDANSETRON 8 MG/50ML IVPB (CHCC)
8.0000 mg | Freq: Once | INTRAVENOUS | Status: AC
  Administered 2014-06-04: 8 mg via INTRAVENOUS

## 2014-06-04 MED ORDER — SODIUM CHLORIDE 0.9 % IV SOLN
Freq: Once | INTRAVENOUS | Status: AC
  Administered 2014-06-04: 14:00:00 via INTRAVENOUS

## 2014-06-04 MED ORDER — HEPARIN SOD (PORK) LOCK FLUSH 100 UNIT/ML IV SOLN
500.0000 [IU] | Freq: Once | INTRAVENOUS | Status: AC | PRN
  Administered 2014-06-04: 500 [IU]
  Filled 2014-06-04: qty 5

## 2014-06-04 NOTE — Progress Notes (Signed)
OFFICE PROGRESS NOTE   06/04/2014   Physicians:David Cristy Hilts, Karrar Husein, Arvella Nigh, Roni Bread Lomax   INTERVAL HISTORY:  Patient is seen, together with a sign language interpreter, in continuing attention to adjuvant chemotherapy in process for T1cN0 triple negative left breast cancer, due #2 of 12 planned weekly taxol treatments today. She had no problems at all with first taxol on 05-21-14, no nausea, no fatigue. She reports numbness in her fingers remains stable. She denied any fever, chills, diarrhea or constipation. no peripheral neuropathy.   She has PAC  ONCOLOGIC HISTORY History is of right breast cancer diagnosed January 2002 when patient was age 63 and premenopausal, T2N0M0 (4cm grade 3 invasive ductal carcinoma, <25% high grade DCIS, S phase 8.6%), ER negative, PR negative, HER 2 positive. She had lumpectomy with 2 negative sentinel nodes, adjuvant chemotherapy by Dr Starr Sinclair with adriamycin cytoxan x 4 cycles from March 2002 thru May 2002 (total adriamycin dose 409 mg with weight 142, height 5'7", for total adriamycin 235 mg/m2), and local radiation (50.4 Gy with boost to 60.4) Gy Note she declined PAC and received the chemotherapy in 2002 by peripheral vein. I followed her beginning Sept 2003, after Dr Pearlie Oyster left [place], until last previous visit 07-2011. She declined TEACH trial with lapatinib in 2007 and 2008. Last breast MRI previously was 08-2011.  Patient had infertility treatments at [facility] in 2005, daughter born 02-01-2005. Menses had been very infrequent after chemo until the infertility treatment, then stopped after that pregnancy.   Patient had bilateral mammograms at [facility] office early 12-2013, with area questioned on left compared with last prior mammograms of 08-2012. She had diagnostic left mammogram and [place] at [facility] on 01-01-14 which showed a mass in left upper inner breast posteriorly 1.5 x 1.4 x 1.3 cm on [place], with no  abnormal left axillary nodes identified. She had biopsy on 01-01-14 with pathology ([id number]) showing grade 1-2 invasive ductal carcinoma ER, PR and HER 2 negative with Ki 66%. She had breast MRI 01-15-2014 with solitary enhancing mass upper inner left breast 1.7 x 2.0 x 1.8 cm, right breast not remarkable, no abnormal appearing lymph nodes. She had CT CAP 01-29-14 then MRI liver 02-05-14 with no evidence of metastatic disease. Genetics testing with Breast/Ovarian Cancer Gene Panel 01-2014 did not reveal a pathogenic mutation in these genes, tho she did have 3 variants of uncertain significance (ATM, [id number]; ATM, c.8674G>A and PMS2, C.1309C>T). She had consultation with Dr Tammi Klippel prior to definitive surgery. She had lumpectomy and left axillary sentinel node biopsy by Dr Lucia Gaskins on 03-06-2014. Pathology [id number]) found 1.8 cm grade II invasive ductal carcinoma with one negative sentinel node and final margins negative. She had PAC placed by Dr Lucia Gaskins.  Chemotherapy began with 2 cycles of dose dense AC on 03-30-14 and 04-13-14, then one treatment of cytoxan taxotere on 4-58-59 complicated by severe rash and hand foot syndrome. She began weekly taxol on 05-21-14.  Review of systems as above, also: Bowels ok. Irritated area on scalp. No problems with PAC. Good energy. No mucositis now. Remainder of 10 point Review of Systems negative.  Objective:  Vital signs in last 24 hours:  BP 152/82  Pulse 97  Temp(Src) 98.4 F (36.9 C) (Oral)  Resp 18  Ht '5\' 7"'  (1.702 m)  Wt 167 lb 12.8 oz (76.114 kg)  BMI 26.28 kg/m2 weight is up 2 lbs.  Alert, oriented and appropriate. Ambulatory without difficulty.  Alopecia  HEENT:PERRL, sclerae not  icteric. Oral mucosa moist without lesions, posterior pharynx clear.  Neck supple. No JVD.  Lymphatics:no cervical,suraclavicular adenopathy Resp: clear to auscultation bilaterally and normal percussion bilaterally Cardio: regular rate and rhythm. No gallop. GI:  soft, nontender, not distended, no mass or organomegaly. Normally active bowel sounds. Surgical incision not remarkable. Musculoskeletal/ Extremities: without pitting edema, cords, tenderness Neuro: no peripheral neuropathy, fingertips just sensitive but no decreased sensation. Congenital deafness Skin without rash, ecchymosis, petechiae. Some discoloration various fingernails and toenails. Portacath-without erythema or tenderness  Lab Results:  Results for orders placed in visit on 06/04/14  COMPREHENSIVE METABOLIC PANEL (DH74)      Result Value Ref Range   Sodium 142  136 - 145 mEq/L   Potassium 4.2  3.5 - 5.1 mEq/L   Chloride 109  98 - 109 mEq/L   CO2 23  22 - 29 mEq/L   Glucose 222 (*) 70 - 140 mg/dl   BUN 10.7  7.0 - 26.0 mg/dL   Creatinine 0.8  0.6 - 1.1 mg/dL   Total Bilirubin 0.66  0.20 - 1.20 mg/dL   Alkaline Phosphatase 112  40 - 150 U/L   AST 39 (*) 5 - 34 U/L   ALT 85 (*) 0 - 55 U/L   Total Protein 6.9  6.4 - 8.3 g/dL   Albumin 3.8  3.5 - 5.0 g/dL   Calcium 9.6  8.4 - 10.4 mg/dL   Anion Gap 9  3 - 11 mEq/L  CBC WITH DIFFERENTIAL      Result Value Ref Range   WBC 1.7 (*) 3.9 - 10.3 10e3/uL   NEUT# 1.5  1.5 - 6.5 10e3/uL   HGB 12.0  11.6 - 15.9 g/dL   HCT 35.2  34.8 - 46.6 %   Platelets 145  145 - 400 10e3/uL   MCV 95.2  79.5 - 101.0 fL   MCH 32.4  25.1 - 34.0 pg   MCHC 34.0  31.5 - 36.0 g/dL   RBC 3.70  3.70 - 5.45 10e6/uL   RDW 16.8 (*) 11.2 - 14.5 %   lymph# 0.2 (*) 0.9 - 3.3 10e3/uL   MONO# 0.0 (*) 0.1 - 0.9 10e3/uL   Eosinophils Absolute 0.0  0.0 - 0.5 10e3/uL   Basophils Absolute 0.0  0.0 - 0.1 10e3/uL   NEUT% 88.3 (*) 38.4 - 76.8 %   LYMPH% 10.8 (*) 14.0 - 49.7 %   MONO% 0.4  0.0 - 14.0 %   EOS% 0.1  0.0 - 7.0 %   BASO% 0.4  0.0 - 2.0 %    Slight increase in AST / ALT noted and discussed with infusion RN; total bili normal. OK to treat today.  Studies/Results:  No results found.  Medications: I have reviewed the patient's current medications.    DISCUSSION: Overall the patient is tolerating her chemotherapy without difficulty. Laboratory data has been reviewed is within treatable range and she will continue with her weekly Taxol for scheduled to  Assessment/Plan: 1.T1cN0 triple negative invasive ductal left breast cancer: post lumpectomy and sentinel node evaluation, two cycles of dose dense AC given 03-30-14 - 04-13-14, with neulasta. Total adriamycin dose now 355 mg/m2. One cycle cytoxan taxotere given 04-27-14 with neulasta 1-63, complicated by severe pruitic rash and hand foot syndrome. Will not use further taxotere. Changed to weekly taxol x 12 beginning 05-21-14, planned x12 weeks to complete adjuvant treatment.  2. Pruritic rash and palmar plantar syndrome, still desquamating otherwise nearly resolved. Case reports of this problem with taxotere  used after dose dense adria cytoxan found after her complication.   3.Genetic variants of unknown significance on testing 01-2014  4.PAC in  5.history of T2N0 ER PR negative HER 2 + right breast cancer in 2002 at age 55 (premenopausal): treatment as above including 4 cycles of AC . Marland Kitchen That adjuvant treatment did not include herceptin in 2002 and subsequently patient declined TEACH trial. No findings of concern on right by mammogram or MRI now  6.congenital deafness: needs sign language interpreter with all visits.  7.post menopausal since age 31. No bone density scan in this EMR  8.negative colonoscopy 11-2013   All questions answered. Patient is in agreement with continuing treatment.  Chemo orders confirmed. She will  have treatment on 06-11-14. Dr. Marko Plume will see her 8-10 with #7 treatment.  OK to treat weekly if ANC >=1.4, plt >=100k and if total bili <2x ULN.    Awilda Metro E, PA-C   06/04/2014, 4:45 PM

## 2014-06-04 NOTE — Patient Instructions (Signed)
[facility] Discharge Instructions for Patients Receiving Chemotherapy  Today you received the following chemotherapy agents: Taxol.  To help prevent nausea and vomiting after your treatment, we encourage you to take your nausea medication as prescribed.   If you develop nausea and vomiting that is not controlled by your nausea medication, call the clinic.   BELOW ARE SYMPTOMS THAT SHOULD BE REPORTED IMMEDIATELY:  *FEVER GREATER THAN 100.5 F  *CHILLS WITH OR WITHOUT FEVER  NAUSEA AND VOMITING THAT IS NOT CONTROLLED WITH YOUR NAUSEA MEDICATION  *UNUSUAL SHORTNESS OF BREATH  *UNUSUAL BRUISING OR BLEEDING  TENDERNESS IN MOUTH AND THROAT WITH OR WITHOUT PRESENCE OF ULCERS  *URINARY PROBLEMS  *BOWEL PROBLEMS  UNUSUAL RASH Items with * indicate a potential emergency and should be followed up as soon as possible.  Feel free to call the clinic you have any questions or concerns. The clinic phone number is [phone].    

## 2014-06-04 NOTE — Telephone Encounter (Signed)
gv interpreter appt schedule for aug/sept. s/w AJ and no new pof sent pt to [facility].

## 2014-06-04 NOTE — Progress Notes (Signed)
Per Adrena, PA, okay to treat today with WBC: 1.7, AST: 39, and ALT: 85.

## 2014-06-07 NOTE — Patient Instructions (Signed)
Continue labs and chemotherapy as scheduled Follow up with Dr. Marko Plume as scheduled

## 2014-06-11 ENCOUNTER — Ambulatory Visit: Payer: BC Managed Care – PPO

## 2014-06-11 ENCOUNTER — Other Ambulatory Visit (HOSPITAL_BASED_OUTPATIENT_CLINIC_OR_DEPARTMENT_OTHER): Payer: BC Managed Care – PPO

## 2014-06-11 ENCOUNTER — Other Ambulatory Visit: Payer: BC Managed Care – PPO

## 2014-06-11 ENCOUNTER — Other Ambulatory Visit: Payer: Self-pay | Admitting: Physician Assistant

## 2014-06-11 ENCOUNTER — Other Ambulatory Visit: Payer: Self-pay | Admitting: Oncology

## 2014-06-11 DIAGNOSIS — C50219 Malignant neoplasm of upper-inner quadrant of unspecified female breast: Secondary | ICD-10-CM

## 2014-06-11 DIAGNOSIS — C50212 Malignant neoplasm of upper-inner quadrant of left female breast: Secondary | ICD-10-CM

## 2014-06-11 LAB — CBC WITH DIFFERENTIAL/PLATELET
BASO%: 0 % (ref 0.0–2.0)
Basophils Absolute: 0 10*3/uL (ref 0.0–0.1)
EOS ABS: 0 10*3/uL (ref 0.0–0.5)
EOS%: 0 % (ref 0.0–7.0)
HCT: 33.6 % — ABNORMAL LOW (ref 34.8–46.6)
HGB: 11.5 g/dL — ABNORMAL LOW (ref 11.6–15.9)
LYMPH%: 18.2 % (ref 14.0–49.7)
MCH: 32 pg (ref 25.1–34.0)
MCHC: 34.2 g/dL (ref 31.5–36.0)
MCV: 93.6 fL (ref 79.5–101.0)
MONO#: 0 10*3/uL — AB (ref 0.1–0.9)
MONO%: 1.3 % (ref 0.0–14.0)
NEUT%: 80.5 % — ABNORMAL HIGH (ref 38.4–76.8)
NEUTROS ABS: 1.3 10*3/uL — AB (ref 1.5–6.5)
PLATELETS: 129 10*3/uL — AB (ref 145–400)
RBC: 3.59 10*6/uL — ABNORMAL LOW (ref 3.70–5.45)
RDW: 15.3 % — ABNORMAL HIGH (ref 11.2–14.5)
WBC: 1.6 10*3/uL — ABNORMAL LOW (ref 3.9–10.3)
lymph#: 0.3 10*3/uL — ABNORMAL LOW (ref 0.9–3.3)

## 2014-06-11 LAB — COMPREHENSIVE METABOLIC PANEL (CC13)
ALT: 76 U/L — ABNORMAL HIGH (ref 0–55)
ANION GAP: 8 meq/L (ref 3–11)
AST: 34 U/L (ref 5–34)
Albumin: 3.9 g/dL (ref 3.5–5.0)
Alkaline Phosphatase: 102 U/L (ref 40–150)
BILIRUBIN TOTAL: 0.66 mg/dL (ref 0.20–1.20)
BUN: 10.9 mg/dL (ref 7.0–26.0)
CO2: 24 meq/L (ref 22–29)
CREATININE: 0.8 mg/dL (ref 0.6–1.1)
Calcium: 9.8 mg/dL (ref 8.4–10.4)
Chloride: 108 mEq/L (ref 98–109)
GLUCOSE: 195 mg/dL — AB (ref 70–140)
Potassium: 4.7 mEq/L (ref 3.5–5.1)
Sodium: 139 mEq/L (ref 136–145)
Total Protein: 7 g/dL (ref 6.4–8.3)

## 2014-06-11 NOTE — Patient Instructions (Signed)
Neutropenia Neutropenia is a condition that occurs when the level of a certain type of white blood cell (neutrophil) in your body becomes lower than normal. Neutrophils are made in the bone marrow and fight infections. These cells protect against bacteria and viruses. The fewer neutrophils you have, and the longer your body remains without them, the greater your risk of getting a severe infection becomes. CAUSES  The cause of neutropenia may be hard to determine. However, it is usually due to 3 main problems:   Decreased production of neutrophils. This may be due to:  Certain medicines such as chemotherapy.  Genetic problems.  Cancer.  Radiation treatments.  Vitamin deficiency.  Some pesticides.  Increased destruction of neutrophils. This may be due to:  Overwhelming infections.  Hemolytic anemia. This is when the body destroys its own blood cells.  Chemotherapy.  Neutrophils moving to areas of the body where they cannot fight infections. This may be due to:  Dialysis procedures.  Conditions where the spleen becomes enlarged. Neutrophils are held in the spleen and are not available to the rest of the body.  Overwhelming infections. The neutrophils are held in the area of the infection and are not available to the rest of the body. SYMPTOMS  There are no specific symptoms of neutropenia. The lack of neutrophils can result in an infection, and an infection can cause various problems. DIAGNOSIS  Diagnosis is made by a blood test. A complete blood count is performed. The normal level of neutrophils in human blood differs with age and race. Infants have lower counts than older children and adults. African Americans have lower counts than Caucasians or Asians. The average adult level is 1500 cells/mm3 of blood. Neutrophil counts are interpreted as follows:  Greater than 1000 cells/mm3 gives normal protection against infection.  500 to 1000 cells/mm3 gives an increased risk for  infection.  200 to 500 cells/mm3 is a greater risk for severe infection.  Lower than 200 cells/mm3 is a marked risk of infection. This may require hospitalization and treatment with antibiotic medicines. TREATMENT  Treatment depends on the underlying cause, severity, and presence of infections or symptoms. It also depends on your health. Your caregiver will discuss the treatment plan with you. Mild cases are often easily treated and have a good outcome. Preventative measures may also be started to limit your risk of infections. Treatment can include:  Taking antibiotics.  Stopping medicines that are known to cause neutropenia.  Correcting nutritional deficiencies by eating green vegetables to supply folic acid and taking vitamin B supplements.  Stopping exposure to pesticides if your neutropenia is related to pesticide exposure.  Taking a blood growth factor called sargramostim, pegfilgrastim, or filgrastim if you are undergoing chemotherapy for cancer. This stimulates white blood cell production.  Removal of the spleen if you have Felty's syndrome and have repeated infections. HOME CARE INSTRUCTIONS   Follow your caregiver's instructions about when you need to have blood work done.  Wash your hands often. Make sure others who come in contact with you also wash their hands.  Wash raw fruits and vegetables before eating them. They can carry bacteria and fungi.  Avoid people with colds or spreadable (contagious) diseases (chickenpox, herpes zoster, influenza).  Avoid large crowds.  Avoid construction areas. The dust can release fungus into the air.  Be cautious around children in daycare or school environments.  Take care of your respiratory system by coughing and deep breathing.  Bathe daily.  Protect your skin from cuts and   burns.  Do not work in the garden or with flowers and plants.  Care for the mouth before and after meals by brushing with a soft toothbrush. If you have  mucositis, do not use mouthwash. Mouthwash contains alcohol and can dry out the mouth even more.  Clean the area between the genitals and the anus (perineal area) after urination and bowel movements. Women need to wipe from front to back.  Use a water soluble lubricant during sexual intercourse and practice good hygiene after. Do not have intercourse if you are severely neutropenic. Check with your caregiver for guidelines.  Exercise daily as tolerated.  Avoid people who were vaccinated with a live vaccine in the past 30 days. You should not receive live vaccines (polio, typhoid).  Do not provide direct care for pets. Avoid animal droppings. Do not clean litter boxes and bird cages.  Do not share food utensils.  Do not use tampons, enemas, or rectal suppositories unless directed by your caregiver.  Use an electric razor to remove hair.  Wash your hands after handling magazines, letters, and newspapers. SEEK IMMEDIATE MEDICAL CARE IF:   You have a fever.  You have chills or start to shake.  You feel nauseous or vomit.  You develop mouth sores.  You develop aches and pains.  You have redness and swelling around open wounds.  Your skin is warm to the touch.  You have pus coming from your wounds.  You develop swollen lymph nodes.  You feel weak or fatigued.  You develop red streaks on the skin. MAKE SURE YOU:  Understand these instructions.  Will watch your condition.  Will get help right away if you are not doing well or get worse. Document Released: 04/17/2002 Document Revised: 01/18/2012 Document Reviewed: 05/15/2011 Exit[employer] Patient Information 2015 [employer]. This information is not intended to replace advice given to you by your health care provider. Make sure you discuss any questions you have with your health care provider.  

## 2014-06-11 NOTE — Progress Notes (Signed)
Noted pt ANC 1.3. Reviewed labs with Adrena and per Dr. Mariana Kaufman treatment condition "may treat in [facility] >1.4" treatment was held today. Pt next scheduled treatment is 8/17 d/t patient is going out of town next week.  Pt states she will return to town on 8/11 and inquired if she could be scheduled for treatment upon her return rather than waiting two weeks for her next treatment.  RN informed pt will send request to Dr. Marko Plume for review and this office will contact her.  Reviewed neutropenic precautions (written and oral) with patient and interpreter and gave patient masks. Pt signed understanding and interpreter verbalized.

## 2014-06-13 ENCOUNTER — Telehealth: Payer: Self-pay | Admitting: *Deleted

## 2014-06-13 ENCOUNTER — Telehealth: Payer: Self-pay | Admitting: Oncology

## 2014-06-13 NOTE — Telephone Encounter (Signed)
Cld pt & interperer to adv of appt sch for 8/11 @ 10:15

## 2014-06-13 NOTE — Telephone Encounter (Signed)
Per [facility] staff phone call scheduled appts. Advised schedulers 

## 2014-06-18 ENCOUNTER — Other Ambulatory Visit: Payer: BC Managed Care – PPO

## 2014-06-18 ENCOUNTER — Ambulatory Visit: Payer: BC Managed Care – PPO

## 2014-06-18 ENCOUNTER — Ambulatory Visit: Payer: BC Managed Care – PPO | Admitting: Oncology

## 2014-06-19 ENCOUNTER — Telehealth: Payer: Self-pay | Admitting: *Deleted

## 2014-06-19 ENCOUNTER — Other Ambulatory Visit: Payer: BC Managed Care – PPO

## 2014-06-19 ENCOUNTER — Ambulatory Visit: Payer: BC Managed Care – PPO

## 2014-06-19 NOTE — Telephone Encounter (Signed)
Called and left VM for patient - missed lab and chemo appointment today. Told patient to call [phone] to reschedule.

## 2014-06-25 ENCOUNTER — Other Ambulatory Visit (HOSPITAL_BASED_OUTPATIENT_CLINIC_OR_DEPARTMENT_OTHER): Payer: BC Managed Care – PPO

## 2014-06-25 ENCOUNTER — Other Ambulatory Visit: Payer: BC Managed Care – PPO

## 2014-06-25 ENCOUNTER — Ambulatory Visit (HOSPITAL_BASED_OUTPATIENT_CLINIC_OR_DEPARTMENT_OTHER): Payer: BC Managed Care – PPO

## 2014-06-25 VITALS — BP 149/84 | HR 88 | Temp 98.4°F | Resp 18

## 2014-06-25 DIAGNOSIS — Z5111 Encounter for antineoplastic chemotherapy: Secondary | ICD-10-CM

## 2014-06-25 DIAGNOSIS — C50219 Malignant neoplasm of upper-inner quadrant of unspecified female breast: Secondary | ICD-10-CM

## 2014-06-25 DIAGNOSIS — C50212 Malignant neoplasm of upper-inner quadrant of left female breast: Secondary | ICD-10-CM

## 2014-06-25 LAB — COMPREHENSIVE METABOLIC PANEL (CC13)
ALT: 85 U/L — ABNORMAL HIGH (ref 0–55)
ANION GAP: 8 meq/L (ref 3–11)
AST: 50 U/L — ABNORMAL HIGH (ref 5–34)
Albumin: 3.9 g/dL (ref 3.5–5.0)
Alkaline Phosphatase: 102 U/L (ref 40–150)
BUN: 11.6 mg/dL (ref 7.0–26.0)
CALCIUM: 9.7 mg/dL (ref 8.4–10.4)
CHLORIDE: 108 meq/L (ref 98–109)
CO2: 22 meq/L (ref 22–29)
CREATININE: 0.8 mg/dL (ref 0.6–1.1)
GLUCOSE: 237 mg/dL — AB (ref 70–140)
Potassium: 4.7 mEq/L (ref 3.5–5.1)
Sodium: 139 mEq/L (ref 136–145)
Total Bilirubin: 0.63 mg/dL (ref 0.20–1.20)
Total Protein: 7 g/dL (ref 6.4–8.3)

## 2014-06-25 LAB — CBC WITH DIFFERENTIAL/PLATELET
BASO%: 0.3 % (ref 0.0–2.0)
BASOS ABS: 0 10*3/uL (ref 0.0–0.1)
EOS ABS: 0 10*3/uL (ref 0.0–0.5)
EOS%: 0 % (ref 0.0–7.0)
HCT: 38.6 % (ref 34.8–46.6)
HEMOGLOBIN: 13 g/dL (ref 11.6–15.9)
LYMPH#: 0.4 10*3/uL — AB (ref 0.9–3.3)
LYMPH%: 6.3 % — ABNORMAL LOW (ref 14.0–49.7)
MCH: 32.5 pg (ref 25.1–34.0)
MCHC: 33.8 g/dL (ref 31.5–36.0)
MCV: 96.2 fL (ref 79.5–101.0)
MONO#: 0 10*3/uL — AB (ref 0.1–0.9)
MONO%: 0.5 % (ref 0.0–14.0)
NEUT%: 92.9 % — AB (ref 38.4–76.8)
NEUTROS ABS: 5.7 10*3/uL (ref 1.5–6.5)
PLATELETS: 189 10*3/uL (ref 145–400)
RBC: 4.01 10*6/uL (ref 3.70–5.45)
RDW: 16.5 % — ABNORMAL HIGH (ref 11.2–14.5)
WBC: 6.1 10*3/uL (ref 3.9–10.3)

## 2014-06-25 MED ORDER — ONDANSETRON 8 MG/NS 50 ML IVPB
INTRAVENOUS | Status: AC
  Filled 2014-06-25: qty 8

## 2014-06-25 MED ORDER — DEXAMETHASONE SODIUM PHOSPHATE 20 MG/5ML IJ SOLN
20.0000 mg | Freq: Once | INTRAMUSCULAR | Status: AC
  Administered 2014-06-25: 20 mg via INTRAVENOUS

## 2014-06-25 MED ORDER — FAMOTIDINE IN NACL 20-0.9 MG/50ML-% IV SOLN
20.0000 mg | Freq: Once | INTRAVENOUS | Status: AC
  Administered 2014-06-25: 20 mg via INTRAVENOUS

## 2014-06-25 MED ORDER — DEXAMETHASONE SODIUM PHOSPHATE 20 MG/5ML IJ SOLN
INTRAMUSCULAR | Status: AC
Start: 2014-06-25 — End: 2014-06-25
  Filled 2014-06-25: qty 5

## 2014-06-25 MED ORDER — HEPARIN SOD (PORK) LOCK FLUSH 100 UNIT/ML IV SOLN
500.0000 [IU] | Freq: Once | INTRAVENOUS | Status: AC | PRN
  Administered 2014-06-25: 500 [IU]
  Filled 2014-06-25: qty 5

## 2014-06-25 MED ORDER — DEXTROSE 5 % IV SOLN
80.0000 mg/m2 | Freq: Once | INTRAVENOUS | Status: AC
  Administered 2014-06-25: 150 mg via INTRAVENOUS
  Filled 2014-06-25: qty 25

## 2014-06-25 MED ORDER — DIPHENHYDRAMINE HCL 50 MG/ML IJ SOLN
50.0000 mg | Freq: Once | INTRAMUSCULAR | Status: AC
  Administered 2014-06-25: 50 mg via INTRAVENOUS

## 2014-06-25 MED ORDER — SODIUM CHLORIDE 0.9 % IV SOLN
Freq: Once | INTRAVENOUS | Status: AC
  Administered 2014-06-25: 10:00:00 via INTRAVENOUS

## 2014-06-25 MED ORDER — SODIUM CHLORIDE 0.9 % IJ SOLN
10.0000 mL | INTRAMUSCULAR | Status: DC | PRN
  Administered 2014-06-25: 10 mL
  Filled 2014-06-25: qty 10

## 2014-06-25 MED ORDER — ONDANSETRON 8 MG/50ML IVPB (CHCC)
8.0000 mg | Freq: Once | INTRAVENOUS | Status: AC
  Administered 2014-06-25: 8 mg via INTRAVENOUS

## 2014-06-25 MED ORDER — DIPHENHYDRAMINE HCL 50 MG/ML IJ SOLN
INTRAMUSCULAR | Status: AC
  Filled 2014-06-25: qty 1

## 2014-06-25 MED ORDER — FAMOTIDINE IN NACL 20-0.9 MG/50ML-% IV SOLN
INTRAVENOUS | Status: AC
  Filled 2014-06-25: qty 50

## 2014-06-25 NOTE — Patient Instructions (Signed)
[place] Discharge Instructions for Patients Receiving Chemotherapy  Today you received the following chemotherapy agent Taxol.  To help prevent nausea and vomiting after your treatment, we encourage you to take your nausea medication as directed by your physician.   If you develop nausea and vomiting that is not controlled by your nausea medication, call the clinic.   BELOW ARE SYMPTOMS THAT SHOULD BE REPORTED IMMEDIATELY:  *FEVER GREATER THAN 100.5 F  *CHILLS WITH OR WITHOUT FEVER  NAUSEA AND VOMITING THAT IS NOT CONTROLLED WITH YOUR NAUSEA MEDICATION  *UNUSUAL SHORTNESS OF BREATH  *UNUSUAL BRUISING OR BLEEDING  TENDERNESS IN MOUTH AND THROAT WITH OR WITHOUT PRESENCE OF ULCERS  *URINARY PROBLEMS  *BOWEL PROBLEMS  UNUSUAL RASH Items with * indicate a potential emergency and should be followed up as soon as possible.  Feel free to call the clinic you have any questions or concerns. The clinic phone number is [phone].

## 2014-06-30 ENCOUNTER — Other Ambulatory Visit: Payer: Self-pay | Admitting: Oncology

## 2014-06-30 DIAGNOSIS — C50212 Malignant neoplasm of upper-inner quadrant of left female breast: Secondary | ICD-10-CM

## 2014-07-01 ENCOUNTER — Telehealth: Payer: Self-pay | Admitting: Adult Health

## 2014-07-01 NOTE — Telephone Encounter (Signed)
Pt to receive updated sch at next visit 08/24, labs/ov/chemo per 08/22 [facility], sent msg to add chemo.Marland Kitchen..KJ

## 2014-07-02 ENCOUNTER — Telehealth: Payer: Self-pay | Admitting: *Deleted

## 2014-07-02 ENCOUNTER — Ambulatory Visit (HOSPITAL_BASED_OUTPATIENT_CLINIC_OR_DEPARTMENT_OTHER): Payer: BC Managed Care – PPO | Admitting: Oncology

## 2014-07-02 ENCOUNTER — Encounter: Payer: Self-pay | Admitting: Oncology

## 2014-07-02 ENCOUNTER — Ambulatory Visit (HOSPITAL_BASED_OUTPATIENT_CLINIC_OR_DEPARTMENT_OTHER): Payer: BC Managed Care – PPO

## 2014-07-02 ENCOUNTER — Other Ambulatory Visit: Payer: BC Managed Care – PPO

## 2014-07-02 ENCOUNTER — Telehealth: Payer: Self-pay | Admitting: Oncology

## 2014-07-02 ENCOUNTER — Other Ambulatory Visit (HOSPITAL_BASED_OUTPATIENT_CLINIC_OR_DEPARTMENT_OTHER): Payer: BC Managed Care – PPO

## 2014-07-02 VITALS — BP 138/80 | HR 96 | Temp 98.3°F | Resp 18 | Ht 67.0 in | Wt 166.0 lb

## 2014-07-02 DIAGNOSIS — Z853 Personal history of malignant neoplasm of breast: Secondary | ICD-10-CM | POA: Diagnosis not present

## 2014-07-02 DIAGNOSIS — C50219 Malignant neoplasm of upper-inner quadrant of unspecified female breast: Secondary | ICD-10-CM | POA: Diagnosis not present

## 2014-07-02 DIAGNOSIS — C50212 Malignant neoplasm of upper-inner quadrant of left female breast: Secondary | ICD-10-CM

## 2014-07-02 DIAGNOSIS — Z5111 Encounter for antineoplastic chemotherapy: Secondary | ICD-10-CM

## 2014-07-02 DIAGNOSIS — G609 Hereditary and idiopathic neuropathy, unspecified: Secondary | ICD-10-CM

## 2014-07-02 DIAGNOSIS — Z171 Estrogen receptor negative status [ER-]: Secondary | ICD-10-CM

## 2014-07-02 LAB — CBC WITH DIFFERENTIAL/PLATELET
BASO%: 0.4 % (ref 0.0–2.0)
BASOS ABS: 0 10*3/uL (ref 0.0–0.1)
EOS%: 0 % (ref 0.0–7.0)
Eosinophils Absolute: 0 10*3/uL (ref 0.0–0.5)
HCT: 36.7 % (ref 34.8–46.6)
HGB: 12.6 g/dL (ref 11.6–15.9)
LYMPH#: 0.2 10*3/uL — AB (ref 0.9–3.3)
LYMPH%: 5.1 % — ABNORMAL LOW (ref 14.0–49.7)
MCH: 32.7 pg (ref 25.1–34.0)
MCHC: 34.4 g/dL (ref 31.5–36.0)
MCV: 95.3 fL (ref 79.5–101.0)
MONO#: 0 10*3/uL — ABNORMAL LOW (ref 0.1–0.9)
MONO%: 0.3 % (ref 0.0–14.0)
NEUT#: 4.6 10*3/uL (ref 1.5–6.5)
NEUT%: 94.2 % — ABNORMAL HIGH (ref 38.4–76.8)
Platelets: 191 10*3/uL (ref 145–400)
RBC: 3.85 10*6/uL (ref 3.70–5.45)
RDW: 15.9 % — AB (ref 11.2–14.5)
WBC: 4.9 10*3/uL (ref 3.9–10.3)

## 2014-07-02 LAB — COMPREHENSIVE METABOLIC PANEL (CC13)
ALK PHOS: 97 U/L (ref 40–150)
ALT: 74 U/L — AB (ref 0–55)
AST: 36 U/L — AB (ref 5–34)
Albumin: 4 g/dL (ref 3.5–5.0)
Anion Gap: 9 mEq/L (ref 3–11)
BUN: 13.3 mg/dL (ref 7.0–26.0)
CALCIUM: 9.7 mg/dL (ref 8.4–10.4)
CHLORIDE: 108 meq/L (ref 98–109)
CO2: 21 mEq/L — ABNORMAL LOW (ref 22–29)
Creatinine: 0.9 mg/dL (ref 0.6–1.1)
Glucose: 234 mg/dl — ABNORMAL HIGH (ref 70–140)
Potassium: 4.6 mEq/L (ref 3.5–5.1)
Sodium: 138 mEq/L (ref 136–145)
Total Bilirubin: 0.54 mg/dL (ref 0.20–1.20)
Total Protein: 7.1 g/dL (ref 6.4–8.3)

## 2014-07-02 MED ORDER — HEPARIN SOD (PORK) LOCK FLUSH 100 UNIT/ML IV SOLN
500.0000 [IU] | Freq: Once | INTRAVENOUS | Status: AC | PRN
  Administered 2014-07-02: 500 [IU]
  Filled 2014-07-02: qty 5

## 2014-07-02 MED ORDER — LIDOCAINE-PRILOCAINE 2.5-2.5 % EX CREA: TOPICAL_CREAM | CUTANEOUS | Status: DC

## 2014-07-02 MED ORDER — FAMOTIDINE IN NACL 20-0.9 MG/50ML-% IV SOLN
INTRAVENOUS | Status: AC
  Filled 2014-07-02: qty 50

## 2014-07-02 MED ORDER — DEXAMETHASONE SODIUM PHOSPHATE 20 MG/5ML IJ SOLN
20.0000 mg | Freq: Once | INTRAMUSCULAR | Status: AC
  Administered 2014-07-02: 20 mg via INTRAVENOUS

## 2014-07-02 MED ORDER — ONDANSETRON 8 MG/NS 50 ML IVPB
INTRAVENOUS | Status: AC
  Filled 2014-07-02: qty 8

## 2014-07-02 MED ORDER — ONDANSETRON 8 MG/50ML IVPB (CHCC)
8.0000 mg | Freq: Once | INTRAVENOUS | Status: AC
  Administered 2014-07-02: 8 mg via INTRAVENOUS

## 2014-07-02 MED ORDER — SODIUM CHLORIDE 0.9 % IV SOLN
Freq: Once | INTRAVENOUS | Status: AC
  Administered 2014-07-02: 12:00:00 via INTRAVENOUS

## 2014-07-02 MED ORDER — PACLITAXEL CHEMO INJECTION 300 MG/50ML
80.0000 mg/m2 | Freq: Once | INTRAVENOUS | Status: AC
  Administered 2014-07-02: 150 mg via INTRAVENOUS
  Filled 2014-07-02: qty 25

## 2014-07-02 MED ORDER — FAMOTIDINE IN NACL 20-0.9 MG/50ML-% IV SOLN
20.0000 mg | Freq: Once | INTRAVENOUS | Status: AC
  Administered 2014-07-02: 20 mg via INTRAVENOUS

## 2014-07-02 MED ORDER — DEXAMETHASONE SODIUM PHOSPHATE 20 MG/5ML IJ SOLN
INTRAMUSCULAR | Status: AC
  Filled 2014-07-02: qty 5

## 2014-07-02 MED ORDER — DIPHENHYDRAMINE HCL 50 MG/ML IJ SOLN
INTRAMUSCULAR | Status: AC
  Filled 2014-07-02: qty 1

## 2014-07-02 MED ORDER — SERTRALINE HCL 100 MG PO TABS: 100.0000 mg | ORAL_TABLET | Freq: Every day | ORAL | Status: AC

## 2014-07-02 MED ORDER — DIPHENHYDRAMINE HCL 50 MG/ML IJ SOLN
50.0000 mg | Freq: Once | INTRAMUSCULAR | Status: AC
  Administered 2014-07-02: 50 mg via INTRAVENOUS

## 2014-07-02 MED ORDER — SODIUM CHLORIDE 0.9 % IJ SOLN
10.0000 mL | INTRAMUSCULAR | Status: DC | PRN
  Administered 2014-07-02: 10 mL
  Filled 2014-07-02: qty 10

## 2014-07-02 MED ORDER — DEXAMETHASONE 4 MG PO TABS: ORAL_TABLET | ORAL | Status: DC

## 2014-07-02 NOTE — Patient Instructions (Signed)
[facility] Discharge Instructions for Patients Receiving Chemotherapy  Today you received the following chemotherapy agents:  Taxol  To help prevent nausea and vomiting after your treatment, we encourage you to take your nausea medication as ordered per MD.   If you develop nausea and vomiting that is not controlled by your nausea medication, call the clinic.   BELOW ARE SYMPTOMS THAT SHOULD BE REPORTED IMMEDIATELY:  *FEVER GREATER THAN 100.5 F  *CHILLS WITH OR WITHOUT FEVER  NAUSEA AND VOMITING THAT IS NOT CONTROLLED WITH YOUR NAUSEA MEDICATION  *UNUSUAL SHORTNESS OF BREATH  *UNUSUAL BRUISING OR BLEEDING  TENDERNESS IN MOUTH AND THROAT WITH OR WITHOUT PRESENCE OF ULCERS  *URINARY PROBLEMS  *BOWEL PROBLEMS  UNUSUAL RASH Items with * indicate a potential emergency and should be followed up as soon as possible.  Feel free to call the clinic you have any questions or concerns. The clinic phone number is [phone].    

## 2014-07-02 NOTE — Telephone Encounter (Signed)
Per staff message and POF I have scheduled appts. Advised scheduler of appts. JMW  

## 2014-07-02 NOTE — Telephone Encounter (Signed)
I have adjusted appts 

## 2014-07-02 NOTE — Progress Notes (Signed)
OFFICE PROGRESS NOTE   07/02/2014   Physicians:David Cristy Hilts, Karrar Husein, John McComb, Roni Bread Lomax   INTERVAL HISTORY:  Patient is seen, together with husband and sign language interpreter, in continuing attention to adjuvant chemotherapy in process for T1cN0 triple negative left breast cancer, due cycle 5 of planned 12 cycles weekly taxol today. Cycle 4 was delayed due to [place] 1.3. Patient has mild numbness in tips of fingers bilaterally, which is a little bothersome with fine motor activities but still tolerable; she has no neuropathy in feet. She is otherwise tolerating the weekly taxol very well, with no nausea, no aches, no fatigue. Previous severe hand foot syndrome has resolved.  She has PAC  Daughter [name] begins 4th grade today, in academically gifted program.   ONCOLOGIC HISTORY History is of right breast cancer diagnosed January 2002 when patient was age 56 and premenopausal, T2N0M0 (4cm grade 3 invasive ductal carcinoma, <25% high grade DCIS, S phase 8.6%), ER negative, PR negative, HER 2 positive. She had lumpectomy with 2 negative sentinel nodes, adjuvant chemotherapy by Dr Starr Sinclair with adriamycin cytoxan x 4 cycles from March 2002 thru May 2002 (total adriamycin dose 409 mg with weight 142, height 5'7", for total adriamycin 235 mg/m2), and local radiation (50.4 Gy with boost to 60.4) Gy Note she declined PAC and received the chemotherapy in 2002 by peripheral vein. I followed her beginning Sept 2003, after Dr Pearlie Oyster left [place], until last previous visit 07-2011. She declined TEACH trial with lapatinib in 2007 and 2008. Last breast MRI previously was 08-2011.  Patient had infertility treatments at [facility] in 2005, daughter born 02-01-2005. Menses had been very infrequent after chemo until the infertility treatment, then stopped after that pregnancy.  Patient had bilateral mammograms at Dr [facility] office early 12-2013, with area questioned on  left compared with last prior mammograms of 08-2012. She had diagnostic left mammogram and [place] at [facility] on 01-01-14 which showed a mass in left upper inner breast posteriorly 1.5 x 1.4 x 1.3 cm on [place], with no abnormal left axillary nodes identified. She had biopsy on 01-01-14 with pathology ([id number]) showing grade 1-2 invasive ductal carcinoma ER, PR and HER 2 negative with Ki 66%. She had breast MRI 01-15-2014 with solitary enhancing mass upper inner left breast 1.7 x 2.0 x 1.8 cm, right breast not remarkable, no abnormal appearing lymph nodes. She had CT CAP 01-29-14 then MRI liver 02-05-14 with no evidence of metastatic disease. Genetics testing with Breast/Ovarian Cancer Gene Panel 01-2014 did not reveal a pathogenic mutation in these genes, tho she did have 3 variants of uncertain significance (ATM, [id number]; ATM, c.8674G>A and PMS2, C.1309C>T). She had consultation with Dr Tammi Klippel prior to definitive surgery. She had lumpectomy and left axillary sentinel node biopsy by Dr Lucia Gaskins on 03-06-2014. Pathology [id number]) found 1.8 cm grade II invasive ductal carcinoma with one negative sentinel node and final margins negative. She had PAC placed by Dr Lucia Gaskins.  Chemotherapy began with 2 cycles of dose dense AC on 03-30-14 and 04-13-14, then one treatment of cytoxan taxotere on 7-82-42 complicated by severe rash and hand foot syndrome. She began weekly taxol on 05-21-14.  Review of systems as above, also: No fever or symptoms of infection. No nausea. Bowels moving regularly. No swelling LE. No discomfort at lumpectomy or left axilla. No problems with PAC. No SOB Remainder of 10 point Review of Systems negative.  Objective:  Vital signs in last 24 hours:  BP 138/80  Pulse  96  Temp(Src) 98.3 F (36.8 C)  Resp 18  Ht '5\' 7"'  (1.702 m)  Wt 166 lb (75.297 kg)  BMI 25.99 kg/m2  SpO2 96%  Weight is down 2 lbs.  Alert, oriented and appropriate. Ambulatory without difficulty. Signing easily. Near  complete alopecia  HEENT:PERRL, sclerae not icteric. Oral mucosa moist without lesions, posterior pharynx clear.  Neck supple. No JVD.  Lymphatics:no cervical,suraclavicular adenopathy Resp: clear to auscultation bilaterally and normal percussion bilaterally Cardio: regular rate and rhythm. No gallop. GI: soft, nontender, not distended. Normally active bowel sounds.  Musculoskeletal/ Extremities: without pitting edema, cords, tenderness Neuro: no peripheral neuropathy. Otherwise nonfocal Skin without rash, ecchymosis, petechiae Breast exam not repeated with interpreter present Portacath-without erythema or tenderness  Lab Results:  Results for orders placed in visit on 07/02/14  CBC WITH DIFFERENTIAL      Result Value Ref Range   WBC 4.9  3.9 - 10.3 10e3/uL   NEUT# 4.6  1.5 - 6.5 10e3/uL   HGB 12.6  11.6 - 15.9 g/dL   HCT 36.7  34.8 - 46.6 %   Platelets 191  145 - 400 10e3/uL   MCV 95.3  79.5 - 101.0 fL   MCH 32.7  25.1 - 34.0 pg   MCHC 34.4  31.5 - 36.0 g/dL   RBC 3.85  3.70 - 5.45 10e6/uL   RDW 15.9 (*) 11.2 - 14.5 %   lymph# 0.2 (*) 0.9 - 3.3 10e3/uL   MONO# 0.0 (*) 0.1 - 0.9 10e3/uL   Eosinophils Absolute 0.0  0.0 - 0.5 10e3/uL   Basophils Absolute 0.0  0.0 - 0.1 10e3/uL   NEUT% 94.2 (*) 38.4 - 76.8 %   LYMPH% 5.1 (*) 14.0 - 49.7 %   MONO% 0.3  0.0 - 14.0 %   EOS% 0.0  0.0 - 7.0 %   BASO% 0.4  0.0 - 2.0 %  COMPREHENSIVE METABOLIC PANEL (EL38)      Result Value Ref Range   Sodium 138  136 - 145 mEq/L   Potassium 4.6  3.5 - 5.1 mEq/L   Chloride 108  98 - 109 mEq/L   CO2 21 (*) 22 - 29 mEq/L   Glucose 234 (*) 70 - 140 mg/dl   BUN 13.3  7.0 - 26.0 mg/dL   Creatinine 0.9  0.6 - 1.1 mg/dL   Total Bilirubin 0.54  0.20 - 1.20 mg/dL   Alkaline Phosphatase 97  40 - 150 U/L   AST 36 (*) 5 - 34 U/L   ALT 74 (*) 0 - 55 U/L   Total Protein 7.1  6.4 - 8.3 g/dL   Albumin 4.0  3.5 - 5.0 g/dL   Calcium 9.7  8.4 - 10.4 mg/dL   Anion Gap 9  3 - 11 mEq/L      Studies/Results:  No results found.  Medications: I have reviewed the patient's current medications. Add B6 or B complex bid. Be sure to eat at time of oral premed decadron.   DISCUSSION: discussed peripheral neuropathy involving fingertips, which we need to follow closely, but from standpoint of treatment of this breast cancer we are all in agreement that we will try to continue with the taxol for now. Obviously if neuropathy symptoms worsen to the point that she is significantly impaired with use of hands we will have to reconsider.   Assessment/Plan: 1.T1cN0 triple negative invasive ductal left breast cancer: post lumpectomy and sentinel node evaluation, two cycles of dose dense AC given 03-30-14 - 04-13-14,  with neulasta. Total adriamycin dose now 355 mg/m2. One cycle cytoxan taxotere given 04-27-14 with neulasta 6-46, complicated by severe pruitic rash and hand foot syndrome. Changed to weekly taxol x 12 beginning 05-21-14, planned x12 weeks to complete adjuvant treatment. She will need referral back to Dr Tammi Klippel after completion of chemotherapy. 2. 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 Add B6, lots of activity with hands. 3.Genetic variants of unknown significance on testing 01-2014  4.PAC in  5.history of T2N0 ER PR negative HER 2 + right breast cancer in 2002 at age 5 (premenopausal): treatment as above including 4 cycles of AC . Marland Kitchen That adjuvant treatment did not include herceptin in 2002 and subsequently patient declined TEACH trial. No findings of concern on right by mammogram or MRI now  6.congenital deafness: needs sign language interpreter with all visits.  7.post menopausal since age 47. No bone density scan in this EMR  8.negative colonoscopy 11-2013 9. Pruritic rash and palmar plantar syndrome resolved. Case reports of this problem with taxotere used after dose dense adria  cytoxan found after her complication.    Chemo orders confirmed. She will see either MD or APP ~ every other treatment or sooner if needed. If neuropathy becomes significantly symptomatic may have to reconsider completion of course, however from standpoint of the breast cancer it seems most prudent to try to continue if possible.     Regina Watts P, MD   07/02/2014, 10:35 AM

## 2014-07-03 ENCOUNTER — Other Ambulatory Visit: Payer: Self-pay | Admitting: Oncology

## 2014-07-03 DIAGNOSIS — C50212 Malignant neoplasm of upper-inner quadrant of left female breast: Secondary | ICD-10-CM

## 2014-07-05 ENCOUNTER — Telehealth: Payer: Self-pay | Admitting: *Deleted

## 2014-07-05 NOTE — Telephone Encounter (Signed)
Called patient as noted below by Dr. Marko Plume and confirmed that she got new schedule and will be here Monday 07/09/14 at 9:45am for lab, MD and chemo. Patient confirms she did get the schedule and will be here Monday.

## 2014-07-05 NOTE — Telephone Encounter (Signed)
Message copied by Christa See on Thu Jul 05, 2014  4:30 PM ------      Message from: Gordy Levan      Created: Tue Jul 03, 2014  2:25 PM       I added more MD visits on new POF today. She has Rx on Mondays, is deaf, will need to be sure she has paper copy of new schedule.            thanks ------

## 2014-07-09 ENCOUNTER — Other Ambulatory Visit: Payer: BC Managed Care – PPO

## 2014-07-09 ENCOUNTER — Ambulatory Visit (HOSPITAL_BASED_OUTPATIENT_CLINIC_OR_DEPARTMENT_OTHER): Payer: BC Managed Care – PPO

## 2014-07-09 ENCOUNTER — Ambulatory Visit (HOSPITAL_BASED_OUTPATIENT_CLINIC_OR_DEPARTMENT_OTHER): Payer: BC Managed Care – PPO | Admitting: Adult Health

## 2014-07-09 ENCOUNTER — Telehealth: Payer: Self-pay | Admitting: *Deleted

## 2014-07-09 ENCOUNTER — Encounter: Payer: Self-pay | Admitting: Adult Health

## 2014-07-09 ENCOUNTER — Other Ambulatory Visit (HOSPITAL_BASED_OUTPATIENT_CLINIC_OR_DEPARTMENT_OTHER): Payer: BC Managed Care – PPO

## 2014-07-09 ENCOUNTER — Telehealth: Payer: Self-pay | Admitting: Oncology

## 2014-07-09 VITALS — BP 166/78 | HR 102 | Temp 98.8°F | Resp 18 | Ht 67.0 in | Wt 165.5 lb

## 2014-07-09 DIAGNOSIS — Z171 Estrogen receptor negative status [ER-]: Secondary | ICD-10-CM

## 2014-07-09 DIAGNOSIS — R21 Rash and other nonspecific skin eruption: Secondary | ICD-10-CM | POA: Diagnosis not present

## 2014-07-09 DIAGNOSIS — C50219 Malignant neoplasm of upper-inner quadrant of unspecified female breast: Secondary | ICD-10-CM

## 2014-07-09 DIAGNOSIS — G609 Hereditary and idiopathic neuropathy, unspecified: Secondary | ICD-10-CM

## 2014-07-09 DIAGNOSIS — C50212 Malignant neoplasm of upper-inner quadrant of left female breast: Secondary | ICD-10-CM

## 2014-07-09 DIAGNOSIS — Z5111 Encounter for antineoplastic chemotherapy: Secondary | ICD-10-CM | POA: Diagnosis not present

## 2014-07-09 DIAGNOSIS — Z853 Personal history of malignant neoplasm of breast: Secondary | ICD-10-CM

## 2014-07-09 LAB — COMPREHENSIVE METABOLIC PANEL (CC13)
ALT: 69 U/L — ABNORMAL HIGH (ref 0–55)
AST: 28 U/L (ref 5–34)
Albumin: 4.1 g/dL (ref 3.5–5.0)
Alkaline Phosphatase: 114 U/L (ref 40–150)
Anion Gap: 9 mEq/L (ref 3–11)
BILIRUBIN TOTAL: 0.74 mg/dL (ref 0.20–1.20)
BUN: 13.3 mg/dL (ref 7.0–26.0)
CALCIUM: 9.7 mg/dL (ref 8.4–10.4)
CHLORIDE: 107 meq/L (ref 98–109)
CO2: 23 mEq/L (ref 22–29)
Creatinine: 0.8 mg/dL (ref 0.6–1.1)
GLUCOSE: 210 mg/dL — AB (ref 70–140)
Potassium: 4.2 mEq/L (ref 3.5–5.1)
Sodium: 139 mEq/L (ref 136–145)
TOTAL PROTEIN: 7 g/dL (ref 6.4–8.3)

## 2014-07-09 LAB — CBC WITH DIFFERENTIAL/PLATELET
BASO%: 0 % (ref 0.0–2.0)
Basophils Absolute: 0 10*3/uL (ref 0.0–0.1)
EOS%: 0 % (ref 0.0–7.0)
Eosinophils Absolute: 0 10*3/uL (ref 0.0–0.5)
HEMATOCRIT: 35.3 % (ref 34.8–46.6)
HGB: 12.3 g/dL (ref 11.6–15.9)
LYMPH#: 0.2 10*3/uL — AB (ref 0.9–3.3)
LYMPH%: 9.8 % — AB (ref 14.0–49.7)
MCH: 32.2 pg (ref 25.1–34.0)
MCHC: 34.8 g/dL (ref 31.5–36.0)
MCV: 92.4 fL (ref 79.5–101.0)
MONO#: 0 10*3/uL — AB (ref 0.1–0.9)
MONO%: 0.4 % (ref 0.0–14.0)
NEUT%: 89.8 % — AB (ref 38.4–76.8)
NEUTROS ABS: 2.2 10*3/uL (ref 1.5–6.5)
PLATELETS: 188 10*3/uL (ref 145–400)
RBC: 3.82 10*6/uL (ref 3.70–5.45)
RDW: 13.8 % (ref 11.2–14.5)
WBC: 2.4 10*3/uL — AB (ref 3.9–10.3)

## 2014-07-09 MED ORDER — DEXTROSE 5 % IV SOLN
80.0000 mg/m2 | Freq: Once | INTRAVENOUS | Status: AC
  Administered 2014-07-09: 150 mg via INTRAVENOUS
  Filled 2014-07-09: qty 25

## 2014-07-09 MED ORDER — FAMOTIDINE IN NACL 20-0.9 MG/50ML-% IV SOLN
INTRAVENOUS | Status: AC
  Filled 2014-07-09: qty 50

## 2014-07-09 MED ORDER — SODIUM CHLORIDE 0.9 % IJ SOLN
10.0000 mL | INTRAMUSCULAR | Status: DC | PRN
  Administered 2014-07-09: 10 mL
  Filled 2014-07-09: qty 10

## 2014-07-09 MED ORDER — SODIUM CHLORIDE 0.9 % IV SOLN
Freq: Once | INTRAVENOUS | Status: AC
  Administered 2014-07-09: 11:00:00 via INTRAVENOUS

## 2014-07-09 MED ORDER — ONDANSETRON 8 MG/50ML IVPB (CHCC)
8.0000 mg | Freq: Once | INTRAVENOUS | Status: AC
  Administered 2014-07-09: 8 mg via INTRAVENOUS

## 2014-07-09 MED ORDER — DIPHENHYDRAMINE HCL 50 MG/ML IJ SOLN
50.0000 mg | Freq: Once | INTRAMUSCULAR | Status: AC
  Administered 2014-07-09: 50 mg via INTRAVENOUS

## 2014-07-09 MED ORDER — ONDANSETRON 8 MG/NS 50 ML IVPB
INTRAVENOUS | Status: AC
  Filled 2014-07-09: qty 8

## 2014-07-09 MED ORDER — HEPARIN SOD (PORK) LOCK FLUSH 100 UNIT/ML IV SOLN
500.0000 [IU] | Freq: Once | INTRAVENOUS | Status: AC | PRN
  Administered 2014-07-09: 500 [IU]
  Filled 2014-07-09: qty 5

## 2014-07-09 MED ORDER — DEXAMETHASONE SODIUM PHOSPHATE 20 MG/5ML IJ SOLN
INTRAMUSCULAR | Status: AC
  Filled 2014-07-09: qty 5

## 2014-07-09 MED ORDER — DEXAMETHASONE SODIUM PHOSPHATE 20 MG/5ML IJ SOLN
20.0000 mg | Freq: Once | INTRAMUSCULAR | Status: AC
  Administered 2014-07-09: 20 mg via INTRAVENOUS

## 2014-07-09 MED ORDER — FAMOTIDINE IN NACL 20-0.9 MG/50ML-% IV SOLN
20.0000 mg | Freq: Once | INTRAVENOUS | Status: AC
  Administered 2014-07-09: 20 mg via INTRAVENOUS

## 2014-07-09 MED ORDER — DIPHENHYDRAMINE HCL 50 MG/ML IJ SOLN
INTRAMUSCULAR | Status: AC
  Filled 2014-07-09: qty 1

## 2014-07-09 NOTE — Telephone Encounter (Signed)
, °

## 2014-07-09 NOTE — Patient Instructions (Signed)
You are doing well.  Your labs are stable to proceed with treatment.  Take vitamin B 6 or b complex daily.  Apply Tea tree oil to your nails daily.

## 2014-07-09 NOTE — Telephone Encounter (Signed)
Last entry ws on the wrong patinet

## 2014-07-09 NOTE — Telephone Encounter (Signed)
Per staff message I have adjusted appts  

## 2014-07-09 NOTE — Telephone Encounter (Signed)
Per desk RN I have scheduled patient for IVF on 9/3. Desk RN to call patient   JMW

## 2014-07-09 NOTE — Patient Instructions (Signed)
[facility] Discharge Instructions for Patients Receiving Chemotherapy  Today you received the following chemotherapy agents:Taxol.  To help prevent nausea and vomiting after your treatment, we encourage you to take your nausea medication, Zofran. Take one every 8 hours as needed.   If you develop nausea and vomiting that is not controlled by your nausea medication, call the clinic.   BELOW ARE SYMPTOMS THAT SHOULD BE REPORTED IMMEDIATELY:  *FEVER GREATER THAN 100.5 F  *CHILLS WITH OR WITHOUT FEVER  NAUSEA AND VOMITING THAT IS NOT CONTROLLED WITH YOUR NAUSEA MEDICATION  *UNUSUAL SHORTNESS OF BREATH  *UNUSUAL BRUISING OR BLEEDING  TENDERNESS IN MOUTH AND THROAT WITH OR WITHOUT PRESENCE OF ULCERS  *URINARY PROBLEMS  *BOWEL PROBLEMS  UNUSUAL RASH Items with * indicate a potential emergency and should be followed up as soon as possible.  Feel free to call the clinic should you have any questions or concerns. The clinic phone number is [phone].

## 2014-07-09 NOTE — Progress Notes (Signed)
OFFICE PROGRESS NOTE   07/10/2014   Physicians:David Cristy Hilts, Karrar Husein, John McComb, Roni Bread Lomax   INTERVAL HISTORY:  Patient is seen, together with sign language interpreter, in continuing attention to adjuvant chemotherapy in process for T1cN0 triple negative left breast cancer, due cycle 6 of planned 12 cycles weekly taxol today. Cycle 4 was delayed due to [place] 1.3. Regina Watts is doing well today.  She is here to be evaluated prior to treatment with Taxol chemotherapy.  She is feeling well today.  She continues to have neuropathy in her toes and the very tips of her fingers, but it is unchanged since her last appointment and evaluation by Dr. Marko Plume.  She has not yet picked up Vitamin B6 or B complex, but states she will this week.  She'd also like to know what may help with her fingernails.  Otherwise, she denies fevers, chills, nausea, vomiting, constipation, diarrhea, or any further concerns.    She has PAC  ONCOLOGIC HISTORY History is of right breast cancer diagnosed January 2002 when patient was age 53 and premenopausal, T2N0M0 (4cm grade 3 invasive ductal carcinoma, <25% high grade DCIS, S phase 8.6%), ER negative, PR negative, HER 2 positive. She had lumpectomy with 2 negative sentinel nodes, adjuvant chemotherapy by Dr Starr Sinclair with adriamycin cytoxan x 4 cycles from March 2002 thru May 2002 (total adriamycin dose 409 mg with weight 142, height 5'7", for total adriamycin 235 mg/m2), and local radiation (50.4 Gy with boost to 60.4) Gy Note she declined PAC and received the chemotherapy in 2002 by peripheral vein. I followed her beginning Sept 2003, after Dr Pearlie Oyster left [place], until last previous visit 07-2011. She declined TEACH trial with lapatinib in 2007 and 2008. Last breast MRI previously was 08-2011.  Patient had infertility treatments at [facility] in 2005, daughter born 02-01-2005. Menses had been very infrequent after chemo until the infertility  treatment, then stopped after that pregnancy.  Patient had bilateral mammograms at Dr [facility] office early 12-2013, with area questioned on left compared with last prior mammograms of 08-2012. She had diagnostic left mammogram and [place] at [facility] on 01-01-14 which showed a mass in left upper inner breast posteriorly 1.5 x 1.4 x 1.3 cm on [place], with no abnormal left axillary nodes identified. She had biopsy on 01-01-14 with pathology ([id number]) showing grade 1-2 invasive ductal carcinoma ER, PR and HER 2 negative with Ki 66%. She had breast MRI 01-15-2014 with solitary enhancing mass upper inner left breast 1.7 x 2.0 x 1.8 cm, right breast not remarkable, no abnormal appearing lymph nodes. She had CT CAP 01-29-14 then MRI liver 02-05-14 with no evidence of metastatic disease. Genetics testing with Breast/Ovarian Cancer Gene Panel 01-2014 did not reveal a pathogenic mutation in these genes, tho she did have 3 variants of uncertain significance (ATM, c.[id number]; ATM, c.8674G>A and PMS2, C.1309C>T). She had consultation with Dr Tammi Klippel prior to definitive surgery. She had lumpectomy and left axillary sentinel node biopsy by Dr Lucia Gaskins on 03-06-2014. Pathology [id number]) found 1.8 cm grade II invasive ductal carcinoma with one negative sentinel node and final margins negative. She had PAC placed by Dr Lucia Gaskins.  Chemotherapy began with 2 cycles of dose dense AC on 03-30-14 and 04-13-14, then one treatment of cytoxan taxotere on 8-75-64 complicated by severe rash and hand foot syndrome. She began weekly taxol on 05-21-14.  Review of systems:  A 10 point review of systems was conducted and is otherwise negative except for what is  noted above.     Objective:  Vital signs in last 24 hours:  BP 166/78  Pulse 102  Temp(Src) 98.8 F (37.1 C) (Oral)  Resp 18  Ht _0  (1.702 m)  Wt 165 lb 8 oz (75.07 kg)  BMI 25.91 kg/m2  SpO2 97%   GENERAL: Patient is a well appearing female in no acute distress HEENT:   Sclerae anicteric.  Oropharynx clear and moist. No ulcerations or evidence of oropharyngeal candidiasis. Neck is supple.  NODES:  No cervical, supraclavicular, or axillary lymphadenopathy palpated.  BREAST EXAM:  Deferred. LUNGS:  Clear to auscultation bilaterally.  No wheezes or rhonchi. HEART:  Regular rate and rhythm. No murmur appreciated. ABDOMEN:  Soft, nontender.  Positive, normoactive bowel sounds. No organomegaly palpated. MSK:  No focal spinal tenderness to palpation. Full range of motion bilaterally in the upper extremities. EXTREMITIES:  No peripheral edema.   SKIN:  Clear with no obvious rashes or skin changes. No nail dyscrasia. NEURO:  Nonfocal. Well oriented.  Appropriate affect.  Lab Results:  Results for orders placed in visit on 07/09/14  CBC WITH DIFFERENTIAL      Result Value Ref Range   WBC 2.4 (*) 3.9 - 10.3 10e3/uL   NEUT# 2.2  1.5 - 6.5 10e3/uL   HGB 12.3  11.6 - 15.9 g/dL   HCT 35.3  34.8 - 46.6 %   Platelets 188  145 - 400 10e3/uL   MCV 92.4  79.5 - 101.0 fL   MCH 32.2  25.1 - 34.0 pg   MCHC 34.8  31.5 - 36.0 g/dL   RBC 3.82  3.70 - 5.45 10e6/uL   RDW 13.8  11.2 - 14.5 %   lymph# 0.2 (*) 0.9 - 3.3 10e3/uL   MONO# 0.0 (*) 0.1 - 0.9 10e3/uL   Eosinophils Absolute 0.0  0.0 - 0.5 10e3/uL   Basophils Absolute 0.0  0.0 - 0.1 10e3/uL   NEUT% 89.8 (*) 38.4 - 76.8 %   LYMPH% 9.8 (*) 14.0 - 49.7 %   MONO% 0.4  0.0 - 14.0 %   EOS% 0.0  0.0 - 7.0 %   BASO% 0.0  0.0 - 2.0 %  COMPREHENSIVE METABOLIC PANEL (GY56)      Result Value Ref Range   Sodium 139  136 - 145 mEq/L   Potassium 4.2  3.5 - 5.1 mEq/L   Chloride 107  98 - 109 mEq/L   CO2 23  22 - 29 mEq/L   Glucose 210 (*) 70 - 140 mg/dl   BUN 13.3  7.0 - 26.0 mg/dL   Creatinine 0.8  0.6 - 1.1 mg/dL   Total Bilirubin 0.74  0.20 - 1.20 mg/dL   Alkaline Phosphatase 114  40 - 150 U/L   AST 28  5 - 34 U/L   ALT 69 (*) 0 - 55 U/L   Total Protein 7.0  6.4 - 8.3 g/dL   Albumin 4.1  3.5 - 5.0 g/dL   Calcium 9.7   8.4 - 10.4 mg/dL   Anion Gap 9  3 - 11 mEq/L     Studies/Results:  No results found.  Medications: Current Outpatient Prescriptions  Medication Sig Dispense Refill  . lidocaine-prilocaine (EMLA) cream Apply to PortaCath  1-2 hrs prior to access as directed.  30 g  1  . sertraline (ZOLOFT) 100 MG tablet Take 1 tablet (100 mg total) by mouth daily.  30 tablet  3  . Alum & Mag Hydroxide-Simeth (MAGIC MOUTHWASH W/LIDOCAINE) SOLN Swish  swallow as needed for mouth/throat soreness  473 mL  2  . dexamethasone (DECADRON) 4 MG tablet Take five tablets with food 12hrs before taxol chemotherapy  20 tablet  1  . HYDROcodone-acetaminophen (NORCO/VICODIN) 5-325 MG per tablet Take 1-2 tablets by mouth every 6 (six) hours as needed.  30 tablet  0  . loratadine (CLARITIN) 10 MG tablet Take 10 mg by mouth daily as needed for allergies.      Marland Kitchen LORazepam (ATIVAN) 0.5 MG tablet Place 1-2 tabs under tongue or swallow every 6 hrs as needed for nausea.  Take night of chemo whether or not any nausea.  Will make Drowsy  30 tablet  0  . ondansetron (ZOFRAN) 8 MG tablet Take by mouth 2 (two) times daily.      . prochlorperazine (COMPAZINE) 10 MG tablet Take 10 mg by mouth every 6 (six) hours as needed for nausea or vomiting.      . pyridOXINE (VITAMIN B-6) 50 MG tablet Take 50 mg by mouth 2 (two) times daily.      . traMADol (ULTRAM) 50 MG tablet Take 1 tablet (50 mg total) by mouth every 6 (six) hours as needed.  30 tablet  0  . triamcinolone (KENALOG) 0.1 % paste Apply twice a day to ulcers on lips  5 g  1   No current facility-administered medications for this visit.     DISCUSSION:  Amritha is doing well today.  I reviewed her CBC with her today which was stable.  She will proceed with cycle 6 of Paclitaxel today.  I recommended she pick up her vitamin b 6 or b complex at the drug store.  I also recommended she apply tea tree oil to her nails.  She is in agreement with the plan.      Assessment/Plan: 1.T1cN0  triple negative invasive ductal left breast cancer: post lumpectomy and sentinel node evaluation, two cycles of dose dense AC given 03-30-14 - 04-13-14, with neulasta. Total adriamycin dose now 355 mg/m2. One cycle cytoxan taxotere given 04-27-14 with neulasta 9-73, complicated by severe pruitic rash and hand foot syndrome. Changed to weekly taxol x 12 beginning 05-21-14, planned x12 weeks to complete adjuvant treatment. She will need referral back to Dr Tammi Klippel after completion of chemotherapy. 2. 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 Add B6, lots of activity with hands. 3.Genetic variants of unknown significance on testing 01-2014  4.PAC in  5.history of T2N0 ER PR negative HER 2 + right breast cancer in 2002 at age 34 (premenopausal): treatment as above including 4 cycles of AC .  That adjuvant treatment did not include herceptin in 2002 and subsequently patient declined TEACH trial. No findings of concern on right by mammogram or MRI. 6.congenital deafness: needs sign language interpreter with all visits.  7.post menopausal since age 30. No bone density scan in this EMR  8.negative colonoscopy 11-2013 9. Pruritic rash and palmar plantar syndrome resolved. Case reports of this problem with taxotere used after dose dense adria cytoxan found after her complication.    Per Dr. Mariana Kaufman previous note "Chemo orders confirmed. She will see either MD or APP ~ every other treatment or sooner if needed. If neuropathy becomes significantly symptomatic may have to reconsider completion of course, however from standpoint of the breast cancer it seems most prudent to try to continue if possible."  Halei will return in one week for labs and treatment  and in 2 weeks for labs, treatment and eval by Dr. Marko Plume.  I spoke with Barbaraann Share briefly who informed me the patient needed to go by scheduling to have it updated.   I did instruct the patient to do this.    I spent 25 minutes counseling the patient face to face.  The total time spent in the appointment was 30 minutes.   Minette Headland, [place] [phone] 07/10/2014, 2:26 PM

## 2014-07-17 ENCOUNTER — Other Ambulatory Visit: Payer: BC Managed Care – PPO

## 2014-07-17 ENCOUNTER — Telehealth: Payer: Self-pay | Admitting: *Deleted

## 2014-07-17 ENCOUNTER — Other Ambulatory Visit: Payer: Self-pay | Admitting: Oncology

## 2014-07-17 ENCOUNTER — Ambulatory Visit (HOSPITAL_BASED_OUTPATIENT_CLINIC_OR_DEPARTMENT_OTHER): Payer: BC Managed Care – PPO

## 2014-07-17 ENCOUNTER — Other Ambulatory Visit (HOSPITAL_BASED_OUTPATIENT_CLINIC_OR_DEPARTMENT_OTHER): Payer: BC Managed Care – PPO

## 2014-07-17 VITALS — BP 146/85 | HR 96 | Temp 98.4°F | Resp 18

## 2014-07-17 DIAGNOSIS — C50212 Malignant neoplasm of upper-inner quadrant of left female breast: Secondary | ICD-10-CM

## 2014-07-17 DIAGNOSIS — C50219 Malignant neoplasm of upper-inner quadrant of unspecified female breast: Secondary | ICD-10-CM

## 2014-07-17 DIAGNOSIS — Z5189 Encounter for other specified aftercare: Secondary | ICD-10-CM

## 2014-07-17 LAB — CBC WITH DIFFERENTIAL/PLATELET
BASO%: 0 % (ref 0.0–2.0)
Basophils Absolute: 0 10*3/uL (ref 0.0–0.1)
EOS%: 0 % (ref 0.0–7.0)
Eosinophils Absolute: 0 10*3/uL (ref 0.0–0.5)
HCT: 35 % (ref 34.8–46.6)
HGB: 12.2 g/dL (ref 11.6–15.9)
LYMPH#: 0.3 10*3/uL — AB (ref 0.9–3.3)
LYMPH%: 22.7 % (ref 14.0–49.7)
MCH: 32.4 pg (ref 25.1–34.0)
MCHC: 34.9 g/dL (ref 31.5–36.0)
MCV: 93.1 fL (ref 79.5–101.0)
MONO#: 0 10*3/uL — ABNORMAL LOW (ref 0.1–0.9)
MONO%: 0.7 % (ref 0.0–14.0)
NEUT#: 1.1 10*3/uL — ABNORMAL LOW (ref 1.5–6.5)
NEUT%: 76.6 % (ref 38.4–76.8)
Platelets: 169 10*3/uL (ref 145–400)
RBC: 3.76 10*6/uL (ref 3.70–5.45)
RDW: 13.9 % (ref 11.2–14.5)
WBC: 1.4 10*3/uL — ABNORMAL LOW (ref 3.9–10.3)

## 2014-07-17 LAB — COMPREHENSIVE METABOLIC PANEL (CC13)
ALT: 57 U/L — AB (ref 0–55)
AST: 32 U/L (ref 5–34)
Albumin: 3.9 g/dL (ref 3.5–5.0)
Alkaline Phosphatase: 91 U/L (ref 40–150)
Anion Gap: 9 mEq/L (ref 3–11)
BUN: 13.8 mg/dL (ref 7.0–26.0)
CALCIUM: 9.7 mg/dL (ref 8.4–10.4)
CHLORIDE: 109 meq/L (ref 98–109)
CO2: 23 mEq/L (ref 22–29)
CREATININE: 0.8 mg/dL (ref 0.6–1.1)
Glucose: 171 mg/dl — ABNORMAL HIGH (ref 70–140)
Potassium: 4.5 mEq/L (ref 3.5–5.1)
Sodium: 140 mEq/L (ref 136–145)
Total Bilirubin: 0.69 mg/dL (ref 0.20–1.20)
Total Protein: 6.9 g/dL (ref 6.4–8.3)

## 2014-07-17 MED ORDER — TBO-FILGRASTIM 300 MCG/0.5ML ~~LOC~~ SOSY
300.0000 ug | PREFILLED_SYRINGE | Freq: Once | SUBCUTANEOUS | Status: AC
  Administered 2014-07-17: 300 ug via SUBCUTANEOUS
  Filled 2014-07-17: qty 0.5

## 2014-07-17 NOTE — Telephone Encounter (Signed)
Per chemo charge RN and POF I have scheduled appts. . JMW

## 2014-07-17 NOTE — Progress Notes (Signed)
ANC: 1.1. Dr. Marko Plume notified. No treatment today. Granix given today and every Tuesday after chemotherapy on Monday.

## 2014-07-17 NOTE — Patient Instructions (Addendum)
Neutropenia Neutropenia is a condition that occurs when the level of a certain type of white blood cell (neutrophil) in your body becomes lower than normal. Neutrophils are made in the bone marrow and fight infections. These cells protect against bacteria and viruses. The fewer neutrophils you have, and the longer your body remains without them, the greater your risk of getting a severe infection becomes. CAUSES  The cause of neutropenia may be hard to determine. However, it is usually due to 3 main problems:   Decreased production of neutrophils. This may be due to:  Certain medicines such as chemotherapy.  Genetic problems.  Cancer.  Radiation treatments.  Vitamin deficiency.  Some pesticides.  Increased destruction of neutrophils. This may be due to:  Overwhelming infections.  Hemolytic anemia. This is when the body destroys its own blood cells.  Chemotherapy.  Neutrophils moving to areas of the body where they cannot fight infections. This may be due to:  Dialysis procedures.  Conditions where the spleen becomes enlarged. Neutrophils are held in the spleen and are not available to the rest of the body.  Overwhelming infections. The neutrophils are held in the area of the infection and are not available to the rest of the body. SYMPTOMS  There are no specific symptoms of neutropenia. The lack of neutrophils can result in an infection, and an infection can cause various problems. DIAGNOSIS  Diagnosis is made by a blood test. A complete blood count is performed. The normal level of neutrophils in human blood differs with age and race. Infants have lower counts than older children and adults. African Americans have lower counts than Caucasians or Asians. The average adult level is 1500 cells/mm3 of blood. Neutrophil counts are interpreted as follows:  Greater than 1000 cells/mm3 gives normal protection against infection.  500 to 1000 cells/mm3 gives an increased risk for  infection.  200 to 500 cells/mm3 is a greater risk for severe infection.  Lower than 200 cells/mm3 is a marked risk of infection. This may require hospitalization and treatment with antibiotic medicines. TREATMENT  Treatment depends on the underlying cause, severity, and presence of infections or symptoms. It also depends on your health. Your caregiver will discuss the treatment plan with you. Mild cases are often easily treated and have a good outcome. Preventative measures may also be started to limit your risk of infections. Treatment can include:  Taking antibiotics.  Stopping medicines that are known to cause neutropenia.  Correcting nutritional deficiencies by eating green vegetables to supply folic acid and taking vitamin B supplements.  Stopping exposure to pesticides if your neutropenia is related to pesticide exposure.  Taking a blood growth factor called sargramostim, pegfilgrastim, or filgrastim if you are undergoing chemotherapy for cancer. This stimulates white blood cell production.  Removal of the spleen if you have Felty's syndrome and have repeated infections. HOME CARE INSTRUCTIONS   Follow your caregiver's instructions about when you need to have blood work done.  Wash your hands often. Make sure others who come in contact with you also wash their hands.  Wash raw fruits and vegetables before eating them. They can carry bacteria and fungi.  Avoid people with colds or spreadable (contagious) diseases (chickenpox, herpes zoster, influenza).  Avoid large crowds.  Avoid construction areas. The dust can release fungus into the air.  Be cautious around children in daycare or school environments.  Take care of your respiratory system by coughing and deep breathing.  Bathe daily.  Protect your skin from cuts and   burns.  Do not work in the garden or with flowers and plants.  Care for the mouth before and after meals by brushing with a soft toothbrush. If you have  mucositis, do not use mouthwash. Mouthwash contains alcohol and can dry out the mouth even more.  Clean the area between the genitals and the anus (perineal area) after urination and bowel movements. Women need to wipe from front to back.  Use a water soluble lubricant during sexual intercourse and practice good hygiene after. Do not have intercourse if you are severely neutropenic. Check with your caregiver for guidelines.  Exercise daily as tolerated.  Avoid people who were vaccinated with a live vaccine in the past 30 days. You should not receive live vaccines (polio, typhoid).  Do not provide direct care for pets. Avoid animal droppings. Do not clean litter boxes and bird cages.  Do not share food utensils.  Do not use tampons, enemas, or rectal suppositories unless directed by your caregiver.  Use an electric razor to remove hair.  Wash your hands after handling magazines, letters, and newspapers. SEEK IMMEDIATE MEDICAL CARE IF:   You have a fever.  You have chills or start to shake.  You feel nauseous or vomit.  You develop mouth sores.  You develop aches and pains.  You have redness and swelling around open wounds.  Your skin is warm to the touch.  You have pus coming from your wounds.  You develop swollen lymph nodes.  You feel weak or fatigued.  You develop red streaks on the skin. MAKE SURE YOU:  Understand these instructions.  Will watch your condition.  Will get help right away if you are not doing well or get worse. Document Released: 04/17/2002 Document Revised: 01/18/2012 Document Reviewed: 05/15/2011 ExitCare Patient Information 2015 [employer]. This information is not intended to replace advice given to you by your health care provider. Make sure you discuss any questions you have with your health care provider. Tbo-Filgrastim injection What is this medicine? TBO-FILGRASTIM (T B O fil GRA stim) is a granulocyte colony-stimulating factor that  stimulates the growth of neutrophils, a type of white blood cell important in the body's fight against infection. It is used to reduce the incidence of fever and infection in patients with certain types of cancer who are receiving chemotherapy that affects the bone marrow. This medicine may be used for other purposes; ask your health care provider or pharmacist if you have questions. COMMON BRAND NAME(S): Granix What should I tell my health care provider before I take this medicine? They need to know if you have any of these conditions: -ongoing radiation therapy -sickle cell anemia -an unusual or allergic reaction to tbo-filgrastim, filgrastim, pegfilgrastim, other medicines, foods, dyes, or preservatives -pregnant or trying to get pregnant -breast-feeding How should I use this medicine? This medicine is for injection under the skin. If you get this medicine at home, you will be taught how to prepare and give this medicine. Refer to the Instructions for Use that come with your medication packaging. Use exactly as directed. Take your medicine at regular intervals. Do not take your medicine more often than directed. It is important that you put your used needles and syringes in a special sharps container. Do not put them in a trash can. If you do not have a sharps container, call your pharmacist or healthcare provider to get one. Talk to your pediatrician regarding the use of this medicine in children. Special care may be needed.   Overdosage: If you think you've taken too much of this medicine contact a poison control center or emergency room at once. Overdosage: If you think you have taken too much of this medicine contact a poison control center or emergency room at once. NOTE: This medicine is only for you. Do not share this medicine with others. What if I miss a dose? It is important not to miss your dose. Call your doctor or health care professional if you miss a dose. What may interact with  this medicine? This medicine may interact with the following medications: -medicines that may cause a release of neutrophils, such as lithium This list may not describe all possible interactions. Give your health care provider a list of all the medicines, herbs, non-prescription drugs, or dietary supplements you use. Also tell them if you smoke, drink alcohol, or use illegal drugs. Some items may interact with your medicine. What should I watch for while using this medicine? You may need blood work done while you are taking this medicine. What side effects may I notice from receiving this medicine? Side effects that you should report to your doctor or health care professional as soon as possible: -allergic reactions like skin rash, itching or hives, swelling of the face, lips, or tongue -shortness of breath or breathing problems -fever -pain, redness, or irritation at site where injected -pinpoint red spots on the skin -stomach or side pain, or pain at the shoulder -swelling -tiredness -trouble passing urine Side effects that usually do not require medical attention (Report these to your doctor or health care professional if they continue or are bothersome.): -bone pain -muscle pain This list may not describe all possible side effects. Call your doctor for medical advice about side effects. You may report side effects to FDA at 1-800-FDA-1088. Where should I keep my medicine? Keep out of the reach of children. Store in a refrigerator between 2 and 8 degrees C (36 and 46 degrees F). Keep in carton to protect from light. Throw away this medicine if it is left out of the refrigerator for more than 5 consecutive days. Throw away any unused medicine after the expiration date. NOTE: This sheet is a summary. It may not cover all possible information. If you have questions about this medicine, talk to your doctor, pharmacist, or health care provider.  2015, Elsevier/Gold Standard. (2014-02-15  11:52:29)  

## 2014-07-18 ENCOUNTER — Ambulatory Visit: Payer: BC Managed Care – PPO

## 2014-07-23 ENCOUNTER — Ambulatory Visit (HOSPITAL_BASED_OUTPATIENT_CLINIC_OR_DEPARTMENT_OTHER): Payer: BC Managed Care – PPO

## 2014-07-23 ENCOUNTER — Other Ambulatory Visit: Payer: BC Managed Care – PPO

## 2014-07-23 ENCOUNTER — Ambulatory Visit (HOSPITAL_BASED_OUTPATIENT_CLINIC_OR_DEPARTMENT_OTHER): Payer: BC Managed Care – PPO | Admitting: Adult Health

## 2014-07-23 ENCOUNTER — Ambulatory Visit: Payer: BC Managed Care – PPO | Admitting: Adult Health

## 2014-07-23 ENCOUNTER — Encounter: Payer: Self-pay | Admitting: Adult Health

## 2014-07-23 ENCOUNTER — Other Ambulatory Visit (HOSPITAL_BASED_OUTPATIENT_CLINIC_OR_DEPARTMENT_OTHER): Payer: BC Managed Care – PPO

## 2014-07-23 VITALS — BP 157/97 | HR 100 | Temp 98.3°F | Resp 20 | Ht 67.0 in | Wt 159.1 lb

## 2014-07-23 DIAGNOSIS — G609 Hereditary and idiopathic neuropathy, unspecified: Secondary | ICD-10-CM

## 2014-07-23 DIAGNOSIS — C50219 Malignant neoplasm of upper-inner quadrant of unspecified female breast: Secondary | ICD-10-CM

## 2014-07-23 DIAGNOSIS — Z5111 Encounter for antineoplastic chemotherapy: Secondary | ICD-10-CM

## 2014-07-23 DIAGNOSIS — Z171 Estrogen receptor negative status [ER-]: Secondary | ICD-10-CM | POA: Diagnosis not present

## 2014-07-23 DIAGNOSIS — C50212 Malignant neoplasm of upper-inner quadrant of left female breast: Secondary | ICD-10-CM

## 2014-07-23 LAB — CBC WITH DIFFERENTIAL/PLATELET
BASO%: 0.3 % (ref 0.0–2.0)
Basophils Absolute: 0 10*3/uL (ref 0.0–0.1)
EOS%: 0.1 % (ref 0.0–7.0)
Eosinophils Absolute: 0 10*3/uL (ref 0.0–0.5)
HEMATOCRIT: 40.7 % (ref 34.8–46.6)
HGB: 13.8 g/dL (ref 11.6–15.9)
LYMPH#: 0.3 10*3/uL — AB (ref 0.9–3.3)
LYMPH%: 13.4 % — ABNORMAL LOW (ref 14.0–49.7)
MCH: 32.4 pg (ref 25.1–34.0)
MCHC: 33.8 g/dL (ref 31.5–36.0)
MCV: 95.9 fL (ref 79.5–101.0)
MONO#: 0 10*3/uL — AB (ref 0.1–0.9)
MONO%: 1.4 % (ref 0.0–14.0)
NEUT#: 1.9 10*3/uL (ref 1.5–6.5)
NEUT%: 84.8 % — ABNORMAL HIGH (ref 38.4–76.8)
Platelets: 185 10*3/uL (ref 145–400)
RBC: 4.25 10*6/uL (ref 3.70–5.45)
RDW: 14.9 % — ABNORMAL HIGH (ref 11.2–14.5)
WBC: 2.3 10*3/uL — AB (ref 3.9–10.3)

## 2014-07-23 LAB — COMPREHENSIVE METABOLIC PANEL (CC13)
ALT: 53 U/L (ref 0–55)
AST: 30 U/L (ref 5–34)
Albumin: 4.2 g/dL (ref 3.5–5.0)
Alkaline Phosphatase: 101 U/L (ref 40–150)
Anion Gap: 10 mEq/L (ref 3–11)
BILIRUBIN TOTAL: 0.71 mg/dL (ref 0.20–1.20)
BUN: 11.7 mg/dL (ref 7.0–26.0)
CALCIUM: 9.7 mg/dL (ref 8.4–10.4)
CHLORIDE: 108 meq/L (ref 98–109)
CO2: 21 mEq/L — ABNORMAL LOW (ref 22–29)
Creatinine: 0.8 mg/dL (ref 0.6–1.1)
Glucose: 194 mg/dl — ABNORMAL HIGH (ref 70–140)
Potassium: 4.8 mEq/L (ref 3.5–5.1)
SODIUM: 139 meq/L (ref 136–145)
TOTAL PROTEIN: 7.3 g/dL (ref 6.4–8.3)

## 2014-07-23 MED ORDER — ONDANSETRON 8 MG/NS 50 ML IVPB
INTRAVENOUS | Status: AC
  Filled 2014-07-23: qty 8

## 2014-07-23 MED ORDER — DIPHENHYDRAMINE HCL 50 MG/ML IJ SOLN
50.0000 mg | Freq: Once | INTRAMUSCULAR | Status: AC
  Administered 2014-07-23: 50 mg via INTRAVENOUS

## 2014-07-23 MED ORDER — HEPARIN SOD (PORK) LOCK FLUSH 100 UNIT/ML IV SOLN
500.0000 [IU] | Freq: Once | INTRAVENOUS | Status: AC | PRN
  Administered 2014-07-23: 500 [IU]
  Filled 2014-07-23: qty 5

## 2014-07-23 MED ORDER — SODIUM CHLORIDE 0.9 % IV SOLN
Freq: Once | INTRAVENOUS | Status: AC
  Administered 2014-07-23: 12:00:00 via INTRAVENOUS

## 2014-07-23 MED ORDER — PACLITAXEL CHEMO INJECTION 300 MG/50ML
80.0000 mg/m2 | Freq: Once | INTRAVENOUS | Status: AC
  Administered 2014-07-23: 150 mg via INTRAVENOUS
  Filled 2014-07-23: qty 25

## 2014-07-23 MED ORDER — DIPHENHYDRAMINE HCL 50 MG/ML IJ SOLN
INTRAMUSCULAR | Status: AC
  Filled 2014-07-23: qty 1

## 2014-07-23 MED ORDER — DEXAMETHASONE SODIUM PHOSPHATE 20 MG/5ML IJ SOLN
20.0000 mg | Freq: Once | INTRAMUSCULAR | Status: AC
  Administered 2014-07-23: 20 mg via INTRAVENOUS

## 2014-07-23 MED ORDER — DEXAMETHASONE SODIUM PHOSPHATE 20 MG/5ML IJ SOLN
INTRAMUSCULAR | Status: AC
  Filled 2014-07-23: qty 5

## 2014-07-23 MED ORDER — SODIUM CHLORIDE 0.9 % IJ SOLN
10.0000 mL | INTRAMUSCULAR | Status: DC | PRN
  Administered 2014-07-23: 10 mL
  Filled 2014-07-23: qty 10

## 2014-07-23 MED ORDER — FAMOTIDINE IN NACL 20-0.9 MG/50ML-% IV SOLN
20.0000 mg | Freq: Once | INTRAVENOUS | Status: AC
  Administered 2014-07-23: 20 mg via INTRAVENOUS

## 2014-07-23 MED ORDER — ONDANSETRON 8 MG/50ML IVPB (CHCC)
8.0000 mg | Freq: Once | INTRAVENOUS | Status: AC
  Administered 2014-07-23: 8 mg via INTRAVENOUS

## 2014-07-23 MED ORDER — FAMOTIDINE IN NACL 20-0.9 MG/50ML-% IV SOLN
INTRAVENOUS | Status: AC
  Filled 2014-07-23: qty 50

## 2014-07-23 NOTE — Progress Notes (Signed)
OFFICE PROGRESS NOTE   07/23/2014   Physicians:David Cristy Hilts, Karrar Husein, John McComb, Roni Bread Lomax   INTERVAL HISTORY:  Patient is seen, together with sign language interpreter, in continuing attention to adjuvant chemotherapy in process for T1cN0 triple negative left breast cancer, due cycle 7 of planned 12 cycles weekly taxol today. Cycle 4 was delayed due to [place] 1.3. Cycle 7 was delayed due to [place] of 1.1.  She is doing well today.  She tells me that her neuropathy in her fingertips and toes is stable and no worse since being evaluated a few weeks ago by Dr. Marko Plume.  She does get assistance with opening items by her daughter.  She did receive one shot of granix last week due to mild neutropenia, and other than mild back pain x 1 day she tolerated it well.  She denies fevers, chills, nausea, vomiting, constipation, diarrhea, skin or nail changes, or any further concerns.     She has PAC  ONCOLOGIC HISTORY History is of right breast cancer diagnosed January 2002 when patient was age 53 and premenopausal, T2N0M0 (4cm grade 3 invasive ductal carcinoma, <25% high grade DCIS, S phase 8.6%), ER negative, PR negative, HER 2 positive. She had lumpectomy with 2 negative sentinel nodes, adjuvant chemotherapy by Dr Starr Sinclair with adriamycin cytoxan x 4 cycles from March 2002 thru May 2002 (total adriamycin dose 409 mg with weight 142, height 5'7", for total adriamycin 235 mg/m2), and local radiation (50.4 Gy with boost to 60.4) Gy Note she declined PAC and received the chemotherapy in 2002 by peripheral vein. I followed her beginning Sept 2003, after Dr Pearlie Oyster left [place], until last previous visit 07-2011. She declined TEACH trial with lapatinib in 2007 and 2008. Last breast MRI previously was 08-2011.  Patient had infertility treatments at [facility] in 2005, daughter born 02-01-2005. Menses had been very infrequent after chemo until the infertility treatment, then stopped  after that pregnancy.  Patient had bilateral mammograms at Dr [facility] office early 12-2013, with area questioned on left compared with last prior mammograms of 08-2012. She had diagnostic left mammogram and [place] at [facility] on 01-01-14 which showed a mass in left upper inner breast posteriorly 1.5 x 1.4 x 1.3 cm on [place], with no abnormal left axillary nodes identified. She had biopsy on 01-01-14 with pathology ([id number]) showing grade 1-2 invasive ductal carcinoma ER, PR and HER 2 negative with Ki 66%. She had breast MRI 01-15-2014 with solitary enhancing mass upper inner left breast 1.7 x 2.0 x 1.8 cm, right breast not remarkable, no abnormal appearing lymph nodes. She had CT CAP 01-29-14 then MRI liver 02-05-14 with no evidence of metastatic disease. Genetics testing with Breast/Ovarian Cancer Gene Panel 01-2014 did not reveal a pathogenic mutation in these genes, tho she did have 3 variants of uncertain significance (ATM, [id number]; ATM, c.8674G>A and PMS2, C.1309C>T). She had consultation with Dr Tammi Klippel prior to definitive surgery. She had lumpectomy and left axillary sentinel node biopsy by Dr Lucia Gaskins on 03-06-2014. Pathology [id number]) found 1.8 cm grade II invasive ductal carcinoma with one negative sentinel node and final margins negative. She had PAC placed by Dr Lucia Gaskins.  Chemotherapy began with 2 cycles of dose dense AC on 03-30-14 and 04-13-14, then one treatment of cytoxan taxotere on 0-25-42 complicated by severe rash and hand foot syndrome. She began weekly taxol on 05-21-14.  Review of systems:  A 10 point review of systems was conducted and is otherwise negative except for what  is noted above.     Objective:  Vital signs in last 24 hours:  BP 157/97  Pulse 100  Temp(Src) 98.3 F (36.8 C) (Oral)  Resp 20  Ht 5' 7" (1.702 m)  Wt 159 lb 1.6 oz (72.167 kg)  BMI 24.91 kg/m2   GENERAL: Patient is a well appearing female in no acute distress HEENT:  Sclerae anicteric.  Oropharynx clear  and moist. No ulcerations or evidence of oropharyngeal candidiasis. Neck is supple.  NODES:  No cervical, supraclavicular, or axillary lymphadenopathy palpated.  BREAST EXAM:  Deferred. LUNGS:  Clear to auscultation bilaterally.  No wheezes or rhonchi. HEART:  Regular rate and rhythm. No murmur appreciated. ABDOMEN:  Soft, nontender.  Positive, normoactive bowel sounds. No organomegaly palpated. MSK:  No focal spinal tenderness to palpation. Full range of motion bilaterally in the upper extremities. EXTREMITIES:  No peripheral edema.   SKIN:  Clear with no obvious rashes or skin changes. No nail dyscrasia. NEURO:  Nonfocal. Well oriented.  Appropriate affect.  Lab Results:  Results for orders placed in visit on 07/23/14  CBC WITH DIFFERENTIAL      Result Value Ref Range   WBC 2.3 (*) 3.9 - 10.3 10e3/uL   NEUT# 1.9  1.5 - 6.5 10e3/uL   HGB 13.8  11.6 - 15.9 g/dL   HCT 40.7  34.8 - 46.6 %   Platelets 185  145 - 400 10e3/uL   MCV 95.9  79.5 - 101.0 fL   MCH 32.4  25.1 - 34.0 pg   MCHC 33.8  31.5 - 36.0 g/dL   RBC 4.25  3.70 - 5.45 10e6/uL   RDW 14.9 (*) 11.2 - 14.5 %   lymph# 0.3 (*) 0.9 - 3.3 10e3/uL   MONO# 0.0 (*) 0.1 - 0.9 10e3/uL   Eosinophils Absolute 0.0  0.0 - 0.5 10e3/uL   Basophils Absolute 0.0  0.0 - 0.1 10e3/uL   NEUT% 84.8 (*) 38.4 - 76.8 %   LYMPH% 13.4 (*) 14.0 - 49.7 %   MONO% 1.4  0.0 - 14.0 %   EOS% 0.1  0.0 - 7.0 %   BASO% 0.3  0.0 - 2.0 %  COMPREHENSIVE METABOLIC PANEL (ZO10)      Result Value Ref Range   Sodium 139  136 - 145 mEq/L   Potassium 4.8  3.5 - 5.1 mEq/L   Chloride 108  98 - 109 mEq/L   CO2 21 (*) 22 - 29 mEq/L   Glucose 194 (*) 70 - 140 mg/dl   BUN 11.7  7.0 - 26.0 mg/dL   Creatinine 0.8  0.6 - 1.1 mg/dL   Total Bilirubin 0.71  0.20 - 1.20 mg/dL   Alkaline Phosphatase 101  40 - 150 U/L   AST 30  5 - 34 U/L   ALT 53  0 - 55 U/L   Total Protein 7.3  6.4 - 8.3 g/dL   Albumin 4.2  3.5 - 5.0 g/dL   Calcium 9.7  8.4 - 10.4 mg/dL   Anion Gap 10   3 - 11 mEq/L     Studies/Results:  No results found.  Medications: Current Outpatient Prescriptions  Medication Sig Dispense Refill  . dexamethasone (DECADRON) 4 MG tablet Take five tablets with food 12hrs before taxol chemotherapy  20 tablet  1  . lidocaine-prilocaine (EMLA) cream Apply to PortaCath  1-2 hrs prior to access as directed.  30 g  1  . loratadine (CLARITIN) 10 MG tablet Take 10 mg by mouth daily  as needed for allergies.      Marland Kitchen sertraline (ZOLOFT) 100 MG tablet Take 1 tablet (100 mg total) by mouth daily.  30 tablet  3  . Alum & Mag Hydroxide-Simeth (MAGIC MOUTHWASH W/LIDOCAINE) SOLN Swish swallow as needed for mouth/throat soreness  473 mL  2  . HYDROcodone-acetaminophen (NORCO/VICODIN) 5-325 MG per tablet Take 1-2 tablets by mouth every 6 (six) hours as needed.  30 tablet  0  . LORazepam (ATIVAN) 0.5 MG tablet Place 1-2 tabs under tongue or swallow every 6 hrs as needed for nausea.  Take night of chemo whether or not any nausea.  Will make Drowsy  30 tablet  0  . ondansetron (ZOFRAN) 8 MG tablet Take by mouth 2 (two) times daily.      . prochlorperazine (COMPAZINE) 10 MG tablet Take 10 mg by mouth every 6 (six) hours as needed for nausea or vomiting.      . pyridOXINE (VITAMIN B-6) 50 MG tablet Take 50 mg by mouth 2 (two) times daily.      . traMADol (ULTRAM) 50 MG tablet Take 1 tablet (50 mg total) by mouth every 6 (six) hours as needed.  30 tablet  0  . triamcinolone (KENALOG) 0.1 % paste Apply twice a day to ulcers on lips  5 g  1   No current facility-administered medications for this visit.   Facility-Administered Medications Ordered in Other Visits  Medication Dose Route Frequency Provider Last Rate Last Dose  . heparin lock flush 100 unit/mL  500 Units Intracatheter Once PRN Lennis Marion Downer, MD      . PACLitaxel (TAXOL) 150 mg in dextrose 5 % 250 mL chemo infusion (</= 53m/m2)  80 mg/m2 (Treatment Plan Actual) Intravenous Once LGordy Levan MD 275 mL/hr at  07/23/14 1220 150 mg at 07/23/14 1220  . sodium chloride 0.9 % injection 10 mL  10 mL Intracatheter PRN Lennis PMarion Downer MD         DISCUSSION:  LSohais doing well today.  She has not started vitamin b as directed.  I again recommended this today.  Her neuropathy is stable.  Her labs are stable, and her WBC is improved since receiving Granix x 1.  She will proceed with cycle 7 of treatment today.    Assessment/Plan: 1.T1cN0 triple negative invasive ductal left breast cancer: post lumpectomy and sentinel node evaluation, two cycles of dose dense AC given 03-30-14 - 04-13-14, with neulasta. Total adriamycin dose now 355 mg/m2. One cycle cytoxan taxotere given 04-27-14 with neulasta 66-57 complicated by severe pruitic rash and hand foot syndrome. Changed to weekly taxol x 12 beginning 05-21-14, planned x12 weeks to complete adjuvant treatment. Due to two episodes of neutropenia, she receives Granix x 1 the day after treatment.  She will need referral back to Dr MTammi Klippelafter completion of chemotherapy. 2. 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 I again recommended she take B6, lots of activity with hands. 3.Genetic variants of unknown significance on testing 01-2014  4.PAC in  5.history of T2N0 ER PR negative HER 2 + right breast cancer in 2002 at age 53(premenopausal): treatment as above including 4 cycles of AC .  That adjuvant treatment did not include herceptin in 2002 and subsequently patient declined TEACH trial. No findings of concern on right by mammogram or MRI. 6.congenital deafness: needs sign language interpreter with all visits.  7.post menopausal since age 53  No bone density scan in this EMR  8.negative colonoscopy 11-2013 9. Pruritic rash and palmar plantar syndrome resolved. Case reports of this problem with taxotere used after dose dense adria cytoxan found after her complication.     Per Dr. Mariana Kaufman previous note "Chemo orders confirmed. She will see either MD or APP ~ every other treatment or sooner if needed. If neuropathy becomes significantly symptomatic may have to reconsider completion of course, however from standpoint of the breast cancer it seems most prudent to try to continue if possible."  K[place] will return tomorrow for granix and in one week for labs, an appt with Dr. Marko Plume, and treatment.  She knows to call us in the interim for any questions or concerns.  We can certainly see her sooner if needed.   I spent 25 minutes counseling the patient face to face.  The total time spent in the appointment was 30 minutes.   Minette Headland, [place] [phone] 07/23/2014, 1:07 PM

## 2014-07-23 NOTE — Patient Instructions (Signed)
[facility] Discharge Instructions for Patients Receiving Chemotherapy  Today you received the following chemotherapy agents Paclitaxel.  To help prevent nausea and vomiting after your treatment, we encourage you to take your nausea medication as directed.    If you develop nausea and vomiting that is not controlled by your nausea medication, call the clinic.   BELOW ARE SYMPTOMS THAT SHOULD BE REPORTED IMMEDIATELY:  *FEVER GREATER THAN 100.5 F  *CHILLS WITH OR WITHOUT FEVER  NAUSEA AND VOMITING THAT IS NOT CONTROLLED WITH YOUR NAUSEA MEDICATION  *UNUSUAL SHORTNESS OF BREATH  *UNUSUAL BRUISING OR BLEEDING  TENDERNESS IN MOUTH AND THROAT WITH OR WITHOUT PRESENCE OF ULCERS  *URINARY PROBLEMS  *BOWEL PROBLEMS  UNUSUAL RASH Items with * indicate a potential emergency and should be followed up as soon as possible.  Feel free to call the clinic you have any questions or concerns. The clinic phone number is [phone].    

## 2014-07-24 ENCOUNTER — Ambulatory Visit (HOSPITAL_BASED_OUTPATIENT_CLINIC_OR_DEPARTMENT_OTHER): Payer: BC Managed Care – PPO

## 2014-07-24 VITALS — BP 141/81 | HR 73 | Temp 98.3°F

## 2014-07-24 DIAGNOSIS — C50212 Malignant neoplasm of upper-inner quadrant of left female breast: Secondary | ICD-10-CM

## 2014-07-24 DIAGNOSIS — Z5189 Encounter for other specified aftercare: Secondary | ICD-10-CM

## 2014-07-24 DIAGNOSIS — C50219 Malignant neoplasm of upper-inner quadrant of unspecified female breast: Secondary | ICD-10-CM

## 2014-07-24 MED ORDER — TBO-FILGRASTIM 300 MCG/0.5ML ~~LOC~~ SOSY
300.0000 ug | PREFILLED_SYRINGE | Freq: Once | SUBCUTANEOUS | Status: AC
  Administered 2014-07-24: 300 ug via SUBCUTANEOUS
  Filled 2014-07-24: qty 0.5

## 2014-07-24 NOTE — Patient Instructions (Signed)

## 2014-07-25 ENCOUNTER — Ambulatory Visit: Payer: BC Managed Care – PPO | Admitting: Oncology

## 2014-07-25 ENCOUNTER — Other Ambulatory Visit: Payer: BC Managed Care – PPO

## 2014-07-29 ENCOUNTER — Observation Stay (HOSPITAL_COMMUNITY)
Admission: EM | Admit: 2014-07-29 | Discharge: 2014-07-30 | Disposition: A | Discharge status: Home / Self Care | Payer: BC Managed Care – PPO | Attending: Internal Medicine | Admitting: Internal Medicine

## 2014-07-29 ENCOUNTER — Emergency Department (HOSPITAL_COMMUNITY): Payer: BC Managed Care – PPO

## 2014-07-29 ENCOUNTER — Encounter (HOSPITAL_COMMUNITY): Payer: Self-pay | Admitting: Emergency Medicine

## 2014-07-29 ENCOUNTER — Other Ambulatory Visit: Payer: Self-pay | Admitting: Oncology

## 2014-07-29 DIAGNOSIS — R7402 Elevation of levels of lactic acid dehydrogenase (LDH): Secondary | ICD-10-CM | POA: Insufficient documentation

## 2014-07-29 DIAGNOSIS — R7401 Elevation of levels of liver transaminase levels: Secondary | ICD-10-CM | POA: Diagnosis not present

## 2014-07-29 DIAGNOSIS — Z9221 Personal history of antineoplastic chemotherapy: Secondary | ICD-10-CM | POA: Diagnosis not present

## 2014-07-29 DIAGNOSIS — H905 Unspecified sensorineural hearing loss: Secondary | ICD-10-CM | POA: Diagnosis present

## 2014-07-29 DIAGNOSIS — R740 Nonspecific elevation of levels of transaminase and lactic acid dehydrogenase [LDH]: Secondary | ICD-10-CM

## 2014-07-29 DIAGNOSIS — H10029 Other mucopurulent conjunctivitis, unspecified eye: Secondary | ICD-10-CM | POA: Diagnosis not present

## 2014-07-29 DIAGNOSIS — Z79899 Other long term (current) drug therapy: Secondary | ICD-10-CM | POA: Insufficient documentation

## 2014-07-29 DIAGNOSIS — Z888 Allergy status to other drugs, medicaments and biological substances status: Secondary | ICD-10-CM | POA: Diagnosis not present

## 2014-07-29 DIAGNOSIS — C50919 Malignant neoplasm of unspecified site of unspecified female breast: Secondary | ICD-10-CM | POA: Insufficient documentation

## 2014-07-29 DIAGNOSIS — Z87891 Personal history of nicotine dependence: Secondary | ICD-10-CM | POA: Insufficient documentation

## 2014-07-29 DIAGNOSIS — R4182 Altered mental status, unspecified: Secondary | ICD-10-CM | POA: Diagnosis not present

## 2014-07-29 DIAGNOSIS — Z853 Personal history of malignant neoplasm of breast: Secondary | ICD-10-CM

## 2014-07-29 DIAGNOSIS — H109 Unspecified conjunctivitis: Secondary | ICD-10-CM | POA: Diagnosis present

## 2014-07-29 DIAGNOSIS — E785 Hyperlipidemia, unspecified: Secondary | ICD-10-CM | POA: Diagnosis not present

## 2014-07-29 DIAGNOSIS — C50219 Malignant neoplasm of upper-inner quadrant of unspecified female breast: Secondary | ICD-10-CM | POA: Diagnosis not present

## 2014-07-29 DIAGNOSIS — C50212 Malignant neoplasm of upper-inner quadrant of left female breast: Secondary | ICD-10-CM

## 2014-07-29 DIAGNOSIS — H9190 Unspecified hearing loss, unspecified ear: Secondary | ICD-10-CM

## 2014-07-29 LAB — COMPREHENSIVE METABOLIC PANEL
ALBUMIN: 4.7 g/dL (ref 3.5–5.2)
ALK PHOS: 106 U/L (ref 39–117)
ALT: 68 U/L — AB (ref 0–35)
AST: 42 U/L — ABNORMAL HIGH (ref 0–37)
Anion gap: 16 — ABNORMAL HIGH (ref 5–15)
BUN: 19 mg/dL (ref 6–23)
CO2: 22 mEq/L (ref 19–32)
Calcium: 10.1 mg/dL (ref 8.4–10.5)
Chloride: 102 mEq/L (ref 96–112)
Creatinine, Ser: 0.93 mg/dL (ref 0.50–1.10)
GFR calc Af Amer: 81 mL/min — ABNORMAL LOW (ref >90)
GFR calc non Af Amer: 70 mL/min — ABNORMAL LOW (ref >90)
Glucose, Bld: 143 mg/dL — ABNORMAL HIGH (ref 70–99)
POTASSIUM: 4.5 meq/L (ref 3.7–5.3)
SODIUM: 140 meq/L (ref 137–147)
TOTAL PROTEIN: 7.9 g/dL (ref 6.0–8.3)
Total Bilirubin: 1.2 mg/dL (ref 0.3–1.2)

## 2014-07-29 LAB — CBC WITH DIFFERENTIAL/PLATELET
BASOS PCT: 0 % (ref 0–1)
Basophils Absolute: 0 10*3/uL (ref 0.0–0.1)
EOS ABS: 0 10*3/uL (ref 0.0–0.7)
Eosinophils Relative: 0 % (ref 0–5)
HCT: 41.7 % (ref 36.0–46.0)
Hemoglobin: 14.7 g/dL (ref 12.0–15.0)
Lymphocytes Relative: 9 % — ABNORMAL LOW (ref 12–46)
Lymphs Abs: 0.7 10*3/uL (ref 0.7–4.0)
MCH: 32.4 pg (ref 26.0–34.0)
MCHC: 35.3 g/dL (ref 30.0–36.0)
MCV: 91.9 fL (ref 78.0–100.0)
Monocytes Absolute: 0.3 10*3/uL (ref 0.1–1.0)
Monocytes Relative: 3 % (ref 3–12)
NEUTROS PCT: 88 % — AB (ref 43–77)
Neutro Abs: 7.1 10*3/uL (ref 1.7–7.7)
PLATELETS: 168 10*3/uL (ref 150–400)
RBC: 4.54 MIL/uL (ref 3.87–5.11)
RDW: 13.2 % (ref 11.5–15.5)
WBC: 8.1 10*3/uL (ref 4.0–10.5)

## 2014-07-29 LAB — RAPID URINE DRUG SCREEN, HOSP PERFORMED
Amphetamines: NOT DETECTED
BARBITURATES: NOT DETECTED
Benzodiazepines: NOT DETECTED
COCAINE: NOT DETECTED
OPIATES: NOT DETECTED
TETRAHYDROCANNABINOL: NOT DETECTED

## 2014-07-29 LAB — URINALYSIS, ROUTINE W REFLEX MICROSCOPIC
Bilirubin Urine: NEGATIVE
Glucose, UA: NEGATIVE mg/dL
HGB URINE DIPSTICK: NEGATIVE
Ketones, ur: NEGATIVE mg/dL
LEUKOCYTES UA: NEGATIVE
Nitrite: NEGATIVE
Protein, ur: 30 mg/dL — AB
SPECIFIC GRAVITY, URINE: 1.022 (ref 1.005–1.030)
UROBILINOGEN UA: 0.2 mg/dL (ref 0.0–1.0)
pH: 6 (ref 5.0–8.0)

## 2014-07-29 LAB — TSH: TSH: 2.33 u[IU]/mL (ref 0.350–4.500)

## 2014-07-29 LAB — LACTATE DEHYDROGENASE: LDH: 266 U/L — AB (ref 94–250)

## 2014-07-29 LAB — URINE MICROSCOPIC-ADD ON

## 2014-07-29 LAB — ACETAMINOPHEN LEVEL: Acetaminophen (Tylenol), Serum: <15 ug/mL (ref 10–30)

## 2014-07-29 LAB — VITAMIN B12: Vitamin B-12: 648 pg/mL (ref 211–911)

## 2014-07-29 LAB — I-STAT CG4 LACTIC ACID, ED
Lactic Acid, Venous: 2.6 mmol/L — ABNORMAL HIGH (ref 0.5–2.2)
Lactic Acid, Venous: 0.6 mmol/L (ref 0.5–2.2)

## 2014-07-29 LAB — ETHANOL: Alcohol, Ethyl (B): <11 mg/dL (ref 0–11)

## 2014-07-29 LAB — FOLATE: Folate: 14.2 ng/mL

## 2014-07-29 LAB — AMMONIA: Ammonia: 18 umol/L (ref 11–60)

## 2014-07-29 LAB — SALICYLATE LEVEL: Salicylate Lvl: <2 mg/dL — ABNORMAL LOW (ref 2.8–20.0)

## 2014-07-29 LAB — POC URINE PREG, ED: Preg Test, Ur: NEGATIVE

## 2014-07-29 MED ORDER — DIPHENHYDRAMINE HCL 50 MG/ML IJ SOLN
50.0000 mg | Freq: Once | INTRAMUSCULAR | Status: AC
  Administered 2014-07-29: 50 mg via INTRAVENOUS
  Filled 2014-07-29: qty 1

## 2014-07-29 MED ORDER — ONDANSETRON HCL 4 MG PO TABS: 4.0000 mg | ORAL_TABLET | Freq: Four times a day (QID) | ORAL | Status: DC | PRN

## 2014-07-29 MED ORDER — SODIUM CHLORIDE 0.9 % IV SOLN
INTRAVENOUS | Status: DC
  Administered 2014-07-29 (×3): via INTRAVENOUS

## 2014-07-29 MED ORDER — ACETAMINOPHEN 650 MG RE SUPP: 650.0000 mg | Freq: Four times a day (QID) | RECTAL | Status: DC | PRN

## 2014-07-29 MED ORDER — LIP MEDEX EX OINT
TOPICAL_OINTMENT | CUTANEOUS | Status: DC | PRN
  Filled 2014-07-29: qty 7

## 2014-07-29 MED ORDER — SODIUM CHLORIDE 0.9 % IV SOLN
INTRAVENOUS | Status: DC
  Administered 2014-07-29: 03:00:00 via INTRAVENOUS

## 2014-07-29 MED ORDER — ACETAMINOPHEN 325 MG PO TABS: 650.0000 mg | ORAL_TABLET | Freq: Four times a day (QID) | ORAL | Status: DC | PRN

## 2014-07-29 MED ORDER — HYDROCODONE-ACETAMINOPHEN 5-325 MG PO TABS: 1.0000 | ORAL_TABLET | Freq: Four times a day (QID) | ORAL | Status: DC | PRN

## 2014-07-29 MED ORDER — POLYVINYL ALCOHOL 1.4 % OP SOLN
1.0000 [drp] | OPHTHALMIC | Status: DC | PRN
  Administered 2014-07-29 (×2): 1 [drp] via OPHTHALMIC
  Filled 2014-07-29: qty 15

## 2014-07-29 MED ORDER — LORAZEPAM 0.5 MG PO TABS
0.5000 mg | ORAL_TABLET | Freq: Four times a day (QID) | ORAL | Status: DC | PRN
  Filled 2014-07-29 (×2): qty 1

## 2014-07-29 MED ORDER — ONDANSETRON HCL 4 MG/2ML IJ SOLN: 4.0000 mg | Freq: Four times a day (QID) | INTRAMUSCULAR | Status: DC | PRN

## 2014-07-29 MED ORDER — MORPHINE SULFATE 2 MG/ML IJ SOLN: 1.0000 mg | INTRAMUSCULAR | Status: DC | PRN

## 2014-07-29 MED ORDER — HEPARIN SODIUM (PORCINE) 5000 UNIT/ML IJ SOLN
5000.0000 [IU] | Freq: Three times a day (TID) | INTRAMUSCULAR | Status: DC
  Administered 2014-07-29 – 2014-07-30 (×2): 5000 [IU] via SUBCUTANEOUS
  Filled 2014-07-29 (×6): qty 1

## 2014-07-29 MED ORDER — CETYLPYRIDINIUM CHLORIDE 0.05 % MT LIQD
7.0000 mL | Freq: Two times a day (BID) | OROMUCOSAL | Status: DC
  Administered 2014-07-29 – 2014-07-30 (×3): 7 mL via OROMUCOSAL

## 2014-07-29 MED ORDER — DIAZEPAM 5 MG/ML IJ SOLN
5.0000 mg | Freq: Once | INTRAMUSCULAR | Status: AC
  Administered 2014-07-29: 5 mg via INTRAVENOUS
  Filled 2014-07-29: qty 2

## 2014-07-29 MED ORDER — VITAMIN B-6 50 MG PO TABS
50.0000 mg | ORAL_TABLET | Freq: Two times a day (BID) | ORAL | Status: DC
  Administered 2014-07-29 – 2014-07-30 (×2): 50 mg via ORAL
  Filled 2014-07-29 (×4): qty 1

## 2014-07-29 MED ORDER — HALOPERIDOL LACTATE 5 MG/ML IJ SOLN
5.0000 mg | Freq: Once | INTRAMUSCULAR | Status: AC
  Administered 2014-07-29: 5 mg via INTRAVENOUS
  Filled 2014-07-29: qty 1

## 2014-07-29 MED ORDER — OFLOXACIN 0.3 % OP SOLN
1.0000 [drp] | Freq: Four times a day (QID) | OPHTHALMIC | Status: DC
  Administered 2014-07-29 – 2014-07-30 (×3): 1 [drp] via OPHTHALMIC
  Filled 2014-07-29: qty 5

## 2014-07-29 NOTE — ED Notes (Signed)
I find her to be profoundly confused and irritable.  She is resistive of all efforts to obtain v.s., or influence her in any way.  She is exhibiting photophobic behaviors.

## 2014-07-29 NOTE — ED Notes (Signed)
I have just given report to [place], RN on [place] and will transport shortly.

## 2014-07-29 NOTE — ED Provider Notes (Signed)
CSN: [id number]     Arrival date & time 07/29/14  0226 History   First MD Initiated Contact with Patient 07/29/14, I was present in room RESB on the patient's arrival.   Chief Complaint  Patient presents with  . Altered Mental Status     (Consider location/radiation/quality/duration/timing/severity/associated sxs/prior Treatment) HPI Level 5 Caveat: altered mental status. This is a 53 year old deaf woman with recurrent breast cancer currently undergoing tamoxifen therapy. Her daughter, who is able to communicate with her by sign language, and her brother accompany her. The daughter states that her mother was normal 2 days ago. Since that time she has had increasing jitteriness, withdrawal from others and unwillingness to communicate with her family, both in person and by video link for the hearing impaired. This morning the patient became very withdrawn and agitated, covering her face with a sweater and not willing to make eye contact or otherwise communicate with family or other people. Her brother states that she normally is very private but this is excessive for her.  On arrival to the room she continues to cover her face with a sweater, will not communicate even with family by sign language, and insists on lying face down on the gurney in a semi-fetal position. Her family has not noticed any focal neurologic deficits. She has had no known fever. She has had no known trauma. She is mildly, but firmly, physically combative with staff avoiding any attempt to obtain vital signs or IV access.  Note: IV access was obtained and the patient was given 5 mg of Valium with some improvement in her symptoms. She was given an additional 5 mg of haloperidol and 50 mg of Benadryl without significant improvement. She continued to be withdrawn and uncooperative.  Past Medical History  Diagnosis Date  . Congenital deafness   . Hyperlipidemia   . Breast cancer JANUARY 2002    RIGHT BREAST T4 SENTINEL NODE  NEGATIVE, HER2 3+, ER/PR NEGATIVE  . Breast cancer February 2015    Left Breast T1N0  . Hx of radiation therapy   . Hx antineoplastic chemo   . Wears glasses     to drive   Past Surgical History  Procedure Laterality Date  . Tonsillectomy    . Breast surgery  2002    lumpectomy on right side  . Cesarean section    . Breast lumpectomy with needle localization and axillary sentinel lymph node bx Left 03/06/2014    Procedure: BREAST LUMPECTOMY WITH NEEDLE LOCALIZATION AND AXILLARY SENTINEL LYMPH NODE BIOPSY;  Surgeon: Shann Medal, MD;  Location: [place];  Service: General;  Laterality: Left;  . Portacath placement Right 03/06/2014    Procedure: INSERTION PORT-A-CATH;  Surgeon: Shann Medal, MD;  Location: [place];  Service: General;  Laterality: Right;  subclavian   Family History  Problem Relation Age of Onset  . Heart disease Mother   . Hypertension Mother   . Hyperlipidemia Mother   . Deafness Mother   . Liver cancer Maternal Aunt   . Liver cancer Maternal Grandfather   . Deafness Father   . Prostate cancer Father 15  . Deafness Sister   . Deafness Paternal Uncle   . Breast cancer Cousin     maternal cousin dx over 21  . Deafness Maternal Aunt   . Leukemia Cousin 2   History  Substance Use Topics  . Smoking status: Former Smoker -- 3 years    Quit date: 01/11/1994  . Smokeless  tobacco: Never Used  . Alcohol Use: Yes     Comment: very very seldom   OB History   Grav Para Term Preterm Abortions TAB SAB Ect Mult Living                 Review of Systems  Unable to perform ROS   Allergies  Taxotere  Home Medications   Prior to Admission medications   Medication Sig Start Date End Date Taking? Authorizing Provider  Alum & Mag Hydroxide-Simeth (MAGIC MOUTHWASH W/LIDOCAINE) SOLN Swish swallow as needed for mouth/throat soreness 04/09/14   Lennis Marion Downer, MD  dexamethasone (DECADRON) 4 MG tablet Take five tablets with food  12hrs before taxol chemotherapy 07/02/14   Lennis Marion Downer, MD  HYDROcodone-acetaminophen (NORCO/VICODIN) 5-325 MG per tablet Take 1-2 tablets by mouth every 6 (six) hours as needed. 03/06/14   Alphonsa Overall, MD  lidocaine-prilocaine (EMLA) cream Apply to PortaCath  1-2 hrs prior to access as directed. 07/02/14   Lennis Marion Downer, MD  loratadine (CLARITIN) 10 MG tablet Take 10 mg by mouth daily as needed for allergies.    Historical Provider, MD  LORazepam (ATIVAN) 0.5 MG tablet Place 1-2 tabs under tongue or swallow every 6 hrs as needed for nausea.  Take night of chemo whether or not any nausea.  Will make Drowsy 03/21/14   Lennis P Marko Plume, MD  ondansetron (ZOFRAN) 8 MG tablet Take by mouth 2 (two) times daily.    Historical Provider, MD  prochlorperazine (COMPAZINE) 10 MG tablet Take 10 mg by mouth every 6 (six) hours as needed for nausea or vomiting.    Historical Provider, MD  pyridOXINE (VITAMIN B-6) 50 MG tablet Take 50 mg by mouth 2 (two) times daily.    Historical Provider, MD  sertraline (ZOLOFT) 100 MG tablet Take 1 tablet (100 mg total) by mouth daily. 07/02/14   Lennis Marion Downer, MD  traMADol (ULTRAM) 50 MG tablet Take 1 tablet (50 mg total) by mouth every 6 (six) hours as needed. 05/04/14   Lennis Marion Downer, MD  triamcinolone (KENALOG) 0.1 % paste Apply twice a day to ulcers on lips 04/25/14   Lennis P Livesay, MD   BP 129/69  Pulse 113  SpO2 82% Temp 97.7 Rectal  Physical Exam General: Well-developed, well-nourished female in apparent emotional distress; appearance consistent with age of record HENT: normocephalic; atraumatic Eyes: Will not make eye contact nor permit ocular examination Neck: supple Heart: regular rate and rhythm; tachycardic Lungs: clear to auscultation bilaterally Abdomen: soft; nondistended; nontender; no masses or hepatosplenomegaly; bowel sounds present Extremities: No deformity; full range of motion; pulses normal Neurologic: Awake; deaf; motor function intact  in all extrties an symmetric; no facial droopoop Skin: Warm and dry; no rash  Psychiatric:  excessively withdrawn; agitated; mildly combative    ED Course  Procedures (including critical care time)  CRITICAL CARE Performed by: Erlene Devita L Total critical care time: 90 minutes Critical care time was exclusive of separately billable procedures and treating other patients. Critical care was necessary to treat or prevent imminent or life-threatening deterioration. Critical care was time spent personally by me on the following activities: development of treatment plan with patient and/or surrogate as well as nursing, discussions with consultants, evaluation of patient's response to treatment, examination of patient, obtaining history from patient or surrogate, ordering and performing treatments and interventions, ordering and review of laboratory studies, ordering and review of radiographic studies, pulse oximetry and re-evaluation of patient's condition.   MDM  Nursing notes and vitals signs, including pulse oximetry, reviewed.  Summary of this visit's results, reviewed by myself:  Labs:  Results for orders placed during the hospital encounter of 07/29/14 (from the past 24 hour(s))  CBC WITH DIFFERENTIAL     Status: Abnormal   Collection Time    07/29/14  2:58 AM      Result Value Ref Range   WBC 8.1  4.0 - 10.5 K/uL   RBC 4.54  3.87 - 5.11 MIL/uL   Hemoglobin 14.7  12.0 - 15.0 g/dL   HCT 41.7  36.0 - 46.0 %   MCV 91.9  78.0 - 100.0 fL   MCH 32.4  26.0 - 34.0 pg   MCHC 35.3  30.0 - 36.0 g/dL   RDW 13.2  11.5 - 15.5 %   Platelets 168  150 - 400 K/uL   Neutrophils Relative % 88 (*) 43 - 77 %   Neutro Abs 7.1  1.7 - 7.7 K/uL   Lymphocytes Relative 9 (*) 12 - 46 %   Lymphs Abs 0.7  0.7 - 4.0 K/uL   Monocytes Relative 3  3 - 12 %   Monocytes Absolute 0.3  0.1 - 1.0 K/uL   Eosinophils Relative 0  0 - 5 %   Eosinophils Absolute 0.0  0.0 - 0.7 K/uL   Basophils Relative 0  0 - 1 %    Basophils Absolute 0.0  0.0 - 0.1 K/uL  COMPREHENSIVE METABOLIC PANEL     Status: Abnormal   Collection Time    07/29/14  2:58 AM      Result Value Ref Range   Sodium 140  137 - 147 mEq/L   Potassium 4.5  3.7 - 5.3 mEq/L   Chloride 102  96 - 112 mEq/L   CO2 22  19 - 32 mEq/L   Glucose, Bld 143 (*) 70 - 99 mg/dL   BUN 19  6 - 23 mg/dL   Creatinine, Ser 0.93  0.50 - 1.10 mg/dL   Calcium 10.1  8.4 - 10.5 mg/dL   Total Protein 7.9  6.0 - 8.3 g/dL   Albumin 4.7  3.5 - 5.2 g/dL   AST 42 (*) 0 - 37 U/L   ALT 68 (*) 0 - 35 U/L   Alkaline Phosphatase 106  39 - 117 U/L   Total Bilirubin 1.2  0.3 - 1.2 mg/dL   GFR calc non Af Amer 70 (*) >90 mL/min   GFR calc Af Amer 81 (*) >90 mL/min   Anion gap 16 (*) 5 - 15  ETHANOL     Status: None   Collection Time    07/29/14  2:58 AM      Result Value Ref Range   Alcohol, Ethyl (B) <11  0 - 11 mg/dL  SALICYLATE LEVEL     Status: Abnormal   Collection Time    07/29/14  2:58 AM      Result Value Ref Range   Salicylate Lvl <5.6 (*) 2.8 - 20.0 mg/dL  ACETAMINOPHEN LEVEL     Status: None   Collection Time    07/29/14  2:58 AM      Result Value Ref Range   Acetaminophen (Tylenol), Serum <15.0  10 - 30 ug/mL  URINE RAPID DRUG SCREEN (HOSP PERFORMED)     Status: None   Collection Time    07/29/14  4:48 AM      Result Value Ref Range   Opiates NONE DETECTED  NONE DETECTED  Cocaine NONE DETECTED  NONE DETECTED   Benzodiazepines NONE DETECTED  NONE DETECTED   Amphetamines NONE DETECTED  NONE DETECTED   Tetrahydrocannabinol NONE DETECTED  NONE DETECTED   Barbiturates NONE DETECTED  NONE DETECTED  I-STAT CG4 LACTIC ACID, ED     Status: Abnormal   Collection Time    07/29/14  4:56 AM      Result Value Ref Range   Lactic Acid, Venous 2.60 (*) 0.5 - 2.2 mmol/L  POC URINE PREG, ED     Status: None   Collection Time    07/29/14  4:58 AM      Result Value Ref Range   Preg Test, Ur NEGATIVE  NEGATIVE  I-STAT CG4 LACTIC ACID, ED     Status: None    Collection Time    07/29/14  6:22 AM      Result Value Ref Range   Lactic Acid, Venous 0.60  0.5 - 2.2 mmol/L    Imaging Studies: Ct Head Wo Contrast  07/29/2014   CLINICAL DATA:  Altered mental status, known breast cancer.  EXAM: CT HEAD WITHOUT CONTRAST  TECHNIQUE: Contiguous axial images were obtained from the base of the skull through the vertex without intravenous contrast.  COMPARISON:  None.  FINDINGS: Patient was unable to remain in proper positioning, resulting in a extremely tilted image acquisition.  Mildly motion degraded examination.  The ventricles and sulci are normal. No intraparenchymal hemorrhage, mass effect nor midline shift. No acute large vascular territory infarcts.  No abnormal extra-axial fluid collections. Basal cisterns are patent.  No skull fracture. The included ocular globes and orbital contents are non-suspicious. The mastoid aircells and included paranasal sinuses are well-aerated.  IMPRESSION: Suboptimal image acquisition without convincing evidence of acute intracranial process.   Electronically Signed   By: Elon Alas   On: 07/29/2014 03:55   Note: Initial lactic acid was accidentally left in the tube system for 30 minutes, which is outside the expiration time of the unit, and tested unexpectedly high. Lactate was rechecked and found to be 0.6.  7:02 AM The patient has been afebrile with a normal CBC and normal repeat lactate without signs or symptoms to suspect sepsis. Her alcohol and drug screens are negative without a history of substance abuse. Her head CT, although containing motion artifact, showed no evidence of acute intracranial pathology such as hemorrhage or metastatic disease. The patient will only minimally interactive with family or with the hearing impaired interpreter. She continues to remain withdrawn. Her clinical picture suggest an acute psychiatric event although she has no history of the same. We will have her admitted for further  evaluation and observation.       Wynetta Fines, MD 07/29/14 [phone]

## 2014-07-29 NOTE — ED Notes (Signed)
Per pt's daughter pt became confused Friday.  Pt's daughter reports coming home from school to find her mom fidgety and not acting like herself. Pt was also reported to have stood in the same place with no obvious reason for several hours prior to daughter calling her uncle to bring pt to hospital.  Pt is deaf and mute since birth.  Pt will not interact w/ any of the staff she has her head covered and refused to uncover it.

## 2014-07-29 NOTE — ED Notes (Signed)
Pt will not allow the O2 sensor to stay on her finger long enough to validate accurate O2 sat.  This Probation officer on multiple occasions held pt's hand to keep sensor on pt and O2 sats ranged from 97-100% on RA w/ a perfect waveform on the monitor.  The documented O2sats in the 80"s are false.

## 2014-07-29 NOTE — Progress Notes (Signed)
Patient has settled into room. Family members are present. Patient is still communicating that she is scared and does not seem to want to share why.  Her family says she has not slept in 3 days.  She did not want to take ativan to help her rest. Family request that we allow the patient to sleep. Vital signs are WNL. Assessment complete. Will continue to monitor.

## 2014-07-29 NOTE — ED Notes (Signed)
Per her deaf interpreter she recognizes her mother; and has just requested to use the bathroom.  Anneah, our tech. Will assist her shortly.

## 2014-07-29 NOTE — ED Notes (Signed)
Patient returned from CT

## 2014-07-29 NOTE — H&P (Signed)
[facility] History and Physical  FLORENTINE DIEKMAN IOX:[id number] DOB: 30-Dec-1960 DOA: 07/29/2014  Referring physician: [place] PCP: Wenda Low, MD   Chief Complaint: Altered mental status and withdrawing behavior  HPI: Regina Watts is a 53 y.o. female with recurrent breast cancer and deafness, patient came into the hospital because of altered mental status and withdrawn behavior. Most of the history obtained from ED physician notes and her brother at bedside. Apparently patient was in her usual state of health till Friday afternoon when she started to have what was described as a withdrawing behavior, she was covering her face and not communicating to her daughter, sister and her brother. This is gotten worse and the patient was in fetal position, so her family brought her to the hospital for further evaluation. Of note she is deaf and she communicates with sign language, in the initially there was consideration for photophobia, patient appears to have some redness in her eyes, she still very withdrawn and does not want to communicate. In the ED initial evaluation showed normal blood work except mild transaminitis, lactic acid was 2.6 initially and repeated 2 hours later which was 0.6. No leukocytosis, no fever. Patient admitted to the hospital for further evaluation.  Review of Systems:  Unable to obtain  Past Medical History  Diagnosis Date  . Congenital deafness   . Hyperlipidemia   . Breast cancer JANUARY 2002    RIGHT BREAST T4 SENTINEL NODE NEGATIVE, HER2 3+, ER/PR NEGATIVE  . Breast cancer February 2015    Left Breast T1N0  . Hx of radiation therapy   . Hx antineoplastic chemo   . Wears glasses     to drive   Past Surgical History  Procedure Laterality Date  . Tonsillectomy    . Breast surgery  2002    lumpectomy on right side  . Cesarean section    . Breast lumpectomy with needle localization and axillary sentinel lymph node bx Left 03/06/2014    Procedure: BREAST  LUMPECTOMY WITH NEEDLE LOCALIZATION AND AXILLARY SENTINEL LYMPH NODE BIOPSY;  Surgeon: Shann Medal, MD;  Location: [place];  Service: General;  Laterality: Left;  . Portacath placement Right 03/06/2014    Procedure: INSERTION PORT-A-CATH;  Surgeon: Shann Medal, MD;  Location: [place];  Service: General;  Laterality: Right;  subclavian   Social History:   reports that she quit smoking about 20 years ago. She has never used smokeless tobacco. She reports that she drinks alcohol. She reports that she does not use illicit drugs.  Allergies  Allergen Reactions  . Taxotere [Docetaxel] Itching and Rash    Family History  Problem Relation Age of Onset  . Heart disease Mother   . Hypertension Mother   . Hyperlipidemia Mother   . Deafness Mother   . Liver cancer Maternal Aunt   . Liver cancer Maternal Grandfather   . Deafness Father   . Prostate cancer Father 59  . Deafness Sister   . Deafness Paternal Uncle   . Breast cancer Cousin     maternal cousin dx over 81  . Deafness Maternal Aunt   . Leukemia Cousin 2     Prior to Admission medications   Medication Sig Start Date End Date Taking? Authorizing Provider  dexamethasone (DECADRON) 4 MG tablet Take five tablets with food 12hrs before taxol chemotherapy 07/02/14  Yes Lennis P Livesay, MD  lidocaine-prilocaine (EMLA) cream Apply to PortaCath  1-2 hrs prior to access as directed. 07/02/14  Yes Lennis Marion Downer, MD  PRESCRIPTION MEDICATION Chemotherapy at [facility]   Yes Historical Provider, MD  sertraline (ZOLOFT) 100 MG tablet Take 1 tablet (100 mg total) by mouth daily. 07/02/14  Yes Lennis Marion Downer, MD  Alum & Mag Hydroxide-Simeth (MAGIC MOUTHWASH W/LIDOCAINE) SOLN Swish swallow as needed for mouth/throat soreness 04/09/14   Lennis Marion Downer, MD  HYDROcodone-acetaminophen (NORCO/VICODIN) 5-325 MG per tablet Take 1-2 tablets by mouth every 6 (six) hours as needed. 03/06/14   Alphonsa Overall, MD  loratadine  (CLARITIN) 10 MG tablet Take 10 mg by mouth daily as needed for allergies.    Historical Provider, MD  LORazepam (ATIVAN) 0.5 MG tablet Place 1-2 tabs under tongue or swallow every 6 hrs as needed for nausea.  Take night of chemo whether or not any nausea.  Will make Drowsy 03/21/14   Lennis P Marko Plume, MD  ondansetron (ZOFRAN) 8 MG tablet Take by mouth 2 (two) times daily.    Historical Provider, MD  prochlorperazine (COMPAZINE) 10 MG tablet Take 10 mg by mouth every 6 (six) hours as needed for nausea or vomiting.    Historical Provider, MD  pyridOXINE (VITAMIN B-6) 50 MG tablet Take 50 mg by mouth 2 (two) times daily.    Historical Provider, MD  traMADol (ULTRAM) 50 MG tablet Take 1 tablet (50 mg total) by mouth every 6 (six) hours as needed. 05/04/14   Lennis Marion Downer, MD  triamcinolone (KENALOG) 0.1 % paste Apply twice a day to ulcers on lips 04/25/14   Lennis Marion Downer, MD   Physical Exam: Filed Vitals:   07/29/14 0837  BP: 145/80  Pulse: 130  Temp: 98 F (36.7 C)  Resp: 18   Constitutional: Oriented to person, place, and time. Well-developed and well-nourished. Cooperative.  Head: Normocephalic and atraumatic.  Nose: Nose normal.  Mouth/Throat: Uvula is midline, oropharynx is clear and moist and mucous membranes are normal.  Eyes: Conjunctivae are pink and there is minimal drainage. Pupils are equal, round, and reactive to light.  Neck: Trachea normal and normal range of motion. Neck supple.  Cardiovascular: Normal rate, regular rhythm, S1 normal, S2 normal, normal heart sounds and intact distal pulses.   Pulmonary/Chest: Effort normal and breath sounds normal.  Abdominal: Soft. Bowel sounds are normal. There is no hepatosplenomegaly. There is no tenderness.  Musculoskeletal: Normal range of motion.  Neurological: Alert and oriented to person, place, and time. Has normal strength. No cranial nerve deficit or sensory deficit.  Skin: Skin is warm, dry and intact.  Psychiatric: Has a  normal mood and affect. Speech is normal and behavior is normal.   Labs on Admission:  Basic Metabolic Panel:  Recent Labs Lab 07/23/14 1027 07/29/14 0258  NA 139 140  K 4.8 4.5  CL  --  102  CO2 21* 22  GLUCOSE 194* 143*  BUN 11.7 19  CREATININE 0.8 0.93  CALCIUM 9.7 10.1   Liver Function Tests:  Recent Labs Lab 07/23/14 1027 07/29/14 0258  AST 30 42*  ALT 53 68*  ALKPHOS 101 106  BILITOT 0.71 1.2  PROT 7.3 7.9  ALBUMIN 4.2 4.7   No results found for this basename: LIPASE, AMYLASE,  in the last 168 hours No results found for this basename: AMMONIA,  in the last 168 hours CBC:  Recent Labs Lab 07/23/14 1026 07/29/14 0258  WBC 2.3* 8.1  NEUTROABS 1.9 7.1  HGB 13.8 14.7  HCT 40.7 41.7  MCV 95.9 91.9  PLT 185 168  Cardiac Enzymes: No results found for this basename: CKTOTAL, CKMB, CKMBINDEX, TROPONINI,  in the last 168 hours  BNP (last 3 results) No results found for this basename: PROBNP,  in the last 8760 hours CBG: No results found for this basename: GLUCAP,  in the last 168 hours  Radiological Exams on Admission: Ct Head Wo Contrast  07/29/2014   CLINICAL DATA:  Altered mental status, known breast cancer.  EXAM: CT HEAD WITHOUT CONTRAST  TECHNIQUE: Contiguous axial images were obtained from the base of the skull through the vertex without intravenous contrast.  COMPARISON:  None.  FINDINGS: Patient was unable to remain in proper positioning, resulting in a extremely tilted image acquisition.  Mildly motion degraded examination.  The ventricles and sulci are normal. No intraparenchymal hemorrhage, mass effect nor midline shift. No acute large vascular territory infarcts.  No abnormal extra-axial fluid collections. Basal cisterns are patent.  No skull fracture. The included ocular globes and orbital contents are non-suspicious. The mastoid aircells and included paranasal sinuses are well-aerated.  IMPRESSION: Suboptimal image acquisition without convincing  evidence of acute intracranial process.   Electronically Signed   By: Elon Alas   On: 07/29/2014 03:55    EKG: Independently reviewed.   Assessment/Plan Principal Problem:   Altered mental status Active Problems:   Breast cancer of upper-inner quadrant of left female breast   Deafness congenital   Transaminasemia   Conjunctivitis    Altered mental status -With withdrawal behavior, unclear if it's related to her eye condition. -Please note that patient uses sign language for communication, I suppose having eye pain and photophobia will make her not want to communicate. -Workup done in the ER exclude sepsis, other causes of altered mental status. Negative UDS. -No fever, no leukocytosis, her neck is supple and there is no meningeal signs. -With the help of an interpreter patient denies headache, denies significant pain, she mentioned feet pain. -Watch and monitor, if not improving might need psych help.  Pink eye -Pink eye with minimal drainage, patient denies significant pain. -Patient will be started on antibiotics eyedrops, consider ophthalmology consult if not improving.  Transaminitis -AST is 42 and ALT is 68, check LDH. Can be secondary to Taxol chemotherapy. -No history of alcohol drinking, her blood alcohol level undetectable. -Continue to hydrate, check an a.m.  Deafness -This is congenital, sign language interpreter at bedside.  Code Status: Full code Family Communication: I discussed with her brother at bedside. Disposition Plan: Observation  Time spent: 70 minutes  [facility] Pager [phone]

## 2014-07-29 NOTE — ED Notes (Signed)
Bed: [facility]PT1 Expected date:  Expected time:  Means of arrival:  Comments: Housekeeping

## 2014-07-30 ENCOUNTER — Ambulatory Visit: Payer: BC Managed Care – PPO | Admitting: Oncology

## 2014-07-30 ENCOUNTER — Other Ambulatory Visit: Payer: BC Managed Care – PPO

## 2014-07-30 ENCOUNTER — Ambulatory Visit: Payer: BC Managed Care – PPO

## 2014-07-30 ENCOUNTER — Telehealth: Payer: Self-pay | Admitting: *Deleted

## 2014-07-30 ENCOUNTER — Other Ambulatory Visit: Payer: Self-pay | Admitting: Oncology

## 2014-07-30 DIAGNOSIS — R21 Rash and other nonspecific skin eruption: Secondary | ICD-10-CM

## 2014-07-30 DIAGNOSIS — E860 Dehydration: Secondary | ICD-10-CM

## 2014-07-30 DIAGNOSIS — R4182 Altered mental status, unspecified: Secondary | ICD-10-CM | POA: Diagnosis not present

## 2014-07-30 DIAGNOSIS — D649 Anemia, unspecified: Secondary | ICD-10-CM | POA: Diagnosis not present

## 2014-07-30 DIAGNOSIS — C50219 Malignant neoplasm of upper-inner quadrant of unspecified female breast: Secondary | ICD-10-CM | POA: Diagnosis not present

## 2014-07-30 DIAGNOSIS — H9190 Unspecified hearing loss, unspecified ear: Secondary | ICD-10-CM | POA: Diagnosis not present

## 2014-07-30 DIAGNOSIS — Z853 Personal history of malignant neoplasm of breast: Secondary | ICD-10-CM | POA: Diagnosis not present

## 2014-07-30 LAB — COMPREHENSIVE METABOLIC PANEL
ALK PHOS: 78 U/L (ref 39–117)
ALT: 59 U/L — ABNORMAL HIGH (ref 0–35)
ANION GAP: 10 (ref 5–15)
AST: 40 U/L — AB (ref 0–37)
Albumin: 3.4 g/dL — ABNORMAL LOW (ref 3.5–5.2)
BUN: 7 mg/dL (ref 6–23)
CO2: 24 mEq/L (ref 19–32)
Calcium: 8.9 mg/dL (ref 8.4–10.5)
Chloride: 109 mEq/L (ref 96–112)
Creatinine, Ser: 0.65 mg/dL (ref 0.50–1.10)
GFR calc Af Amer: >90 mL/min (ref >90)
GFR calc non Af Amer: >90 mL/min (ref >90)
Glucose, Bld: 113 mg/dL — ABNORMAL HIGH (ref 70–99)
POTASSIUM: 3.7 meq/L (ref 3.7–5.3)
Sodium: 143 mEq/L (ref 137–147)
TOTAL PROTEIN: 5.8 g/dL — AB (ref 6.0–8.3)
Total Bilirubin: 0.6 mg/dL (ref 0.3–1.2)

## 2014-07-30 LAB — CBC
HCT: 31.1 % — ABNORMAL LOW (ref 36.0–46.0)
Hemoglobin: 10.6 g/dL — ABNORMAL LOW (ref 12.0–15.0)
MCH: 32.6 pg (ref 26.0–34.0)
MCHC: 34.1 g/dL (ref 30.0–36.0)
MCV: 95.7 fL (ref 78.0–100.0)
Platelets: 125 10*3/uL — ABNORMAL LOW (ref 150–400)
RBC: 3.25 MIL/uL — ABNORMAL LOW (ref 3.87–5.11)
RDW: 13.4 % (ref 11.5–15.5)
WBC: 3.4 10*3/uL — ABNORMAL LOW (ref 4.0–10.5)

## 2014-07-30 MED ORDER — HEPARIN SOD (PORK) LOCK FLUSH 100 UNIT/ML IV SOLN
500.0000 [IU] | Freq: Once | INTRAVENOUS | Status: AC
Start: 2014-07-30 — End: 2014-07-30
  Administered 2014-07-30: 500 [IU] via INTRAVENOUS
  Filled 2014-07-30: qty 5

## 2014-07-30 MED ORDER — SODIUM CHLORIDE 0.9 % IJ SOLN
10.0000 mL | INTRAMUSCULAR | Status: DC | PRN
  Administered 2014-07-30: 10 mL

## 2014-07-30 NOTE — Progress Notes (Signed)
07/30/2014, 9:21 AM  Hospital day 2 Antibiotics: none Chemotherapy: #7 of planned 12 treatments weekly taxol given 07-23-14   Patient was scheduled for MD visit and chemotherapy at [facility] this AM, however was admitted to [facility] on 07-29-14 due to mental status changes. She is seen in hospital now with sign language interpreter at bedside, no family here presently. I have followed her since initial right breast cancer 2002, with adjuvant chemotherapy now underway for second primary left breast cancer which was triple negative at diagnosis 12-2013.  Following visit I have spoken with hospitalist physician and with unit RN by phone.  Subjective: Patient is feeling much better this AM, fully oriented, seems entirely back to baseline both to this MD and to interpreter (who has known her for yeaars). Patient reports unable to sleep last week possibly due to stress (chemotherapy, daughter back in school), not eating or drinking fluids well then. She believes that she was confused taking pm medications on Friday 9-18, believes she took sertaline (this has been on her med list for some time, not clear to me if she takes it regularly) and steroids then (steroids premed for chemo only, so that timing not correct); possibly also medication starting with "A" (has ativan with home meds) and also has compazine on home meds. She felt "not right" on 9-19 including idea that someone was watching her thru window. Daughter Regina Watts was able to contact family member, who brought her to ED. CT head had no acute findings. Husband arrived back home from his job in [place] on 07-29-14.  She denies fever, pain, SOB, nausea. She slept a little last pm other than nursing interruptions. She walked to BR just now without difficulty and wants to eat breakfast. 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 She is having a little  difficulty texting with the neuropathy, but is able to do this. She is signing easily today.   She has PAC.  ONCOLOGIC HISTORY History is of right breast cancer diagnosed January 2002 when patient was age 53 and premenopausal, T2N0M0 (4cm grade 3 invasive ductal carcinoma, <25% high grade DCIS, S phase 8.6%), ER negative, PR negative, HER 2 positive. She had lumpectomy with 2 negative sentinel nodes, adjuvant chemotherapy by Dr Starr Sinclair with adriamycin cytoxan x 4 cycles from March 2002 thru May 2002 (total adriamycin dose 409 mg with weight 142, height 5'7", for total adriamycin 235 mg/m2), and local radiation (50.4 Gy with boost to 60.4) Gy Note she declined PAC and received the chemotherapy in 2002 by peripheral vein. I followed her beginning Sept 2003, after Dr Pearlie Oyster left [place], until last previous visit 07-2011. She declined TEACH trial with lapatinib in 2007 and 2008. Last breast MRI previously was 08-2011.  Patient had infertility treatments at [facility] in 2005, daughter born 51-26-2006. Menses had been very infrequent after chemo until the infertility treatment, then stopped after that pregnancy.  Patient had bilateral mammograms at [facility] office early 12-2013, with area questioned on left compared with last prior mammograms of 08-2012. She had diagnostic left mammogram and [place] at [facility] on 01-01-14 which showed a mass in left upper inner breast posteriorly 1.5 x 1.4 x 1.3 cm on [place], with no abnormal left axillary nodes identified. She had biopsy on 01-01-14 with pathology ([id number]) showing grade 1-2 invasive ductal carcinoma ER, PR and HER 2 negative with Ki 66%. She had breast  MRI 01-15-2014 with solitary enhancing mass upper inner left breast 1.7 x 2.0 x 1.8 cm, right breast not remarkable, no abnormal appearing lymph nodes. She had CT CAP 01-29-14 then MRI liver 02-05-14 with no evidence of metastatic disease. Genetics testing with Breast/Ovarian Cancer Gene Panel 01-2014 did not reveal a  pathogenic mutation in these genes, tho she did have 3 variants of uncertain significance (ATM, [id number]; ATM, c.8674G>A and PMS2, C.1309C>T). She had consultation with Dr Tammi Klippel prior to definitive surgery. She had lumpectomy and left axillary sentinel node biopsy by Dr Lucia Gaskins on 03-06-2014. Pathology [id number]) found 1.8 cm grade II invasive ductal carcinoma with one negative sentinel node and final margins negative. She had PAC placed by Dr Lucia Gaskins.  Chemotherapy began with 2 cycles of dose dense AC on 03-30-14 and 04-13-14, then one treatment of cytoxan taxotere on 5-91-63 complicated by severe rash and hand foot syndrome. She began weekly taxol on 05-21-14. Cycles 4 and 7 were delayed due to low WBC.  Objective: Vital signs in last 24 hours: Blood pressure 119/75, pulse 84, temperature 98 F (36.7 C), temperature source Oral, resp. rate 18, SpO2 99.00%.   Intake/Output from previous day: 09/20 0701 - 09/21 0700 In: 1206.7 [I.V.:1206.7] Out: -  Intake/Output this shift: Total I/O In: 821.7 [I.V.:821.7] Out: -   Physical exam: awake, alert, interacting well with interpreter and myself, cooperative and appears comfortable in bed on RA. Total alopecia. PERRL. Not icteric. Oral mucosa moist and clear. Neck supple without JVD. Lungs without wheezes or rales. PAC accessed, site not remarkable. Breast exam not repeated now. Abdomen + bowel sounds, soft, not tender, not distended, no HSM. LE no edema, cords, tenderness. Changes position easily in bed. Neuro: decreased sensation fingertips bilaterally and bottoms of feet, otherwise nonfocal. Psych seems at appropriate baseline mood and affect.  Lab Results:  Recent Labs  07/29/14 0258 07/30/14 0526  WBC 8.1 3.4*  HGB 14.7 10.6*  HCT 41.7 31.1*  PLT 168 125*   BMET  Recent Labs  07/29/14 0258 07/30/14 0526  NA 140 143  K 4.5 3.7  CL 102 109  CO2 22 24  GLUCOSE 143* 113*  BUN 19 7  CREATININE 0.93 0.65  CALCIUM 10.1 8.9    Additional labs from admission reviewed in EMR.  Studies/Results: Ct Head Wo Contrast  07/29/2014   CLINICAL DATA:  Altered mental status, known breast cancer.  EXAM: CT HEAD WITHOUT CONTRAST  TECHNIQUE: Contiguous axial images were obtained from the base of the skull through the vertex without intravenous contrast.  COMPARISON:  None.  FINDINGS: Patient was unable to remain in proper positioning, resulting in a extremely tilted image acquisition.  Mildly motion degraded examination.  The ventricles and sulci are normal. No intraparenchymal hemorrhage, mass effect nor midline shift. No acute large vascular territory infarcts.  No abnormal extra-axial fluid collections. Basal cisterns are patent.  No skull fracture. The included ocular globes and orbital contents are non-suspicious. The mastoid aircells and included paranasal sinuses are well-aerated.  IMPRESSION: Suboptimal image acquisition without convincing evidence of acute intracranial process.   Electronically Signed   By: Elon Alas   On: 07/29/2014 03:55     Assessment/Plan: 1.Mental status changes seem likely medication related + stress, appear resolved. Would DC compazine, tho admittedly history for medications used PTA is not clear. No chemo today. I will see her back prior to treatment next week. 2.T1cN0 triple negative invasive ductal left breast cancer: post lumpectomy and sentinel node evaluation, two cycles  of dose dense AC given 03-30-14 - 04-13-14, with neulasta. Total adriamycin dose now 355 mg/m2. One cycle cytoxan taxotere given 04-27-14 with neulasta 0-48, complicated by severe pruitic rash and hand foot syndrome. Changed to weekly taxol x 12 beginning 05-21-14, planned x12 weeks to complete adjuvant treatment. She will need referral back to Dr Tammi Klippel after completion of chemotherapy.  3. Peripheral neuropathy in fingertips and feet related to taxanes: may limit completion of planned treatment, but will reassess next  week. 4.PAC in  5.history of T2N0 ER PR negative HER 2 + right breast cancer in 2002 at age 81 (premenopausal): treatment as above including 4 cycles of AC . Marland Kitchen That adjuvant treatment did not include herceptin in 2002 and subsequently patient declined TEACH trial. No findings of concern on right by mammogram or MRI now  6.congenital deafness: communicates with sign language  7.post menopausal since age 34. No bone density scan in this EMR  8.negative colonoscopy 11-2013  9. Pruritic rash and palmar plantar syndrome resolved. Case reports of this problem with taxotere used after dose dense adria cytoxan found after her complication.  10.Genetic variants of unknown significance on testing 01-2014  11. Hemoglobin lower with rehydration since admission: probably was somewhat dehydrated also on admission. Will recheck next week. No overt bleeding, likely chemo related.   , P

## 2014-07-30 NOTE — Telephone Encounter (Signed)
Adjusted appt per desk RN

## 2014-07-30 NOTE — Progress Notes (Signed)
UR completed 

## 2014-07-30 NOTE — Discharge Summary (Signed)
Physician Discharge Summary  [name]:[id number] DOB: 1961-10-15 DOA: 07/29/2014  PCP: Wenda Low, MD  Admit date: 07/29/2014 Discharge date: 07/30/2014  Time spent: 40 minutes  Recommendations for Outpatient Follow-up:  1. Followup with Dr. Marko Plume in one week.  Discharge Diagnoses:  Principal Problem:   Altered mental status Active Problems:   Breast cancer of upper-inner quadrant of left female breast   Deafness congenital   Transaminasemia   Conjunctivitis   Discharge Condition: Stable  Diet recommendation: Regular  There were no vitals filed for this visit.  History of present illness:  Regina Watts is a 53 y.o. female with recurrent breast cancer and deafness, patient came into the hospital because of altered mental status and withdrawn behavior. Most of the history obtained from ED physician notes and her brother at bedside. Apparently patient was in her usual state of health till Friday afternoon when she started to have what was described as a withdrawing behavior, she was covering her face and not communicating to her daughter, sister and her brother. This is gotten worse and the patient was in fetal position, so her family brought her to the hospital for further evaluation. Of note she is deaf and she communicates with sign language, in the initially there was consideration for photophobia, patient appears to have some redness in her eyes, she still very withdrawn and does not want to communicate.  In the ED initial evaluation showed normal blood work except mild transaminitis, lactic acid was 2.6 initially and repeated 2 hours later which was 0.6. No leukocytosis, no fever. Patient admitted to the hospital for further evaluation.  Hospital Course:   Altered mental status  -With withdrawal behavior, patient is back to her baseline. -Workup done in the ER exclude sepsis, other causes of altered mental status. Negative UDS.  -No fever, no leukocytosis, her neck  is supple and there is no meningeal signs.  -With the help of an interpreter patient denies headache, denies significant pain, she mentioned feet pain.  -Unclear if she has "extra medications", patient is back to her baseline. -Discussed with her oncologist Dr. Marko Plume, she is already back to her baseline, discharge and followup with her within one week.  Pink eye  -Pink eye with minimal drainage, patient denies significant pain.  -Patient will be started on antibiotics eyedrops, eyedrops discontinue the time of discharge. -Redness resolved, this is likely secondary to lack of sleep.   Transaminitis  -AST is 42 and ALT is 68, check LDH. Can be secondary to Taxol chemotherapy.  -No history of alcohol drinking, her blood alcohol level undetectable.  -Continue to hydrate, check an a.m.   Deafness  -This is congenital, sign language interpreter at bedside.   Procedures:  None  Consultations:  Oncology  Discharge Exam: Filed Vitals:   07/30/14 0548  BP: 119/75  Pulse: 84  Temp: 98 F (36.7 C)  Resp: 18   General: Alert and awake, oriented x3, not in any acute distress. HEENT: anicteric sclera, pupils reactive to light and accommodation, EOMI CVS: S1-S2 clear, no murmur rubs or gallops Chest: clear to auscultation bilaterally, no wheezing, rales or rhonchi Abdomen: soft nontender, nondistended, normal bowel sounds, no organomegaly Extremities: no cyanosis, clubbing or edema noted bilaterally Neuro: Cranial nerves II-XII intact, no focal neurological deficits  Discharge Instructions You were cared for by a hospitalist during your hospital stay. If you have any questions about your discharge medications or the care you received while you were in the hospital after you  are discharged, you can call the unit and asked to speak with the hospitalist on call if the hospitalist that took care of you is not available. Once you are discharged, your primary care physician will handle any  further medical issues. Please note that NO REFILLS for any discharge medications will be authorized once you are discharged, as it is imperative that you return to your primary care physician (or establish a relationship with a primary care physician if you do not have one) for your aftercare needs so that they can reassess your need for medications and monitor your lab values.  Discharge Instructions   Diet - low sodium heart healthy    Complete by:  As directed      Increase activity slowly    Complete by:  As directed           Current Discharge Medication List    CONTINUE these medications which have NOT CHANGED   Details  dexamethasone (DECADRON) 4 MG tablet Take five tablets with food 12hrs before taxol chemotherapy Qty: 20 tablet, Refills: 1   Associated Diagnoses: Breast cancer of upper-inner quadrant of left female breast    lidocaine-prilocaine (EMLA) cream Apply to PortaCath  1-2 hrs prior to access as directed. Qty: 30 g, Refills: 1   Associated Diagnoses: Breast cancer of upper-inner quadrant of left female breast    PRESCRIPTION MEDICATION Chemotherapy at [facility]    sertraline (ZOLOFT) 100 MG tablet Take 1 tablet (100 mg total) by mouth daily. Qty: 30 tablet, Refills: 3   Associated Diagnoses: Breast cancer of upper-inner quadrant of left female breast    Alum & Mag Hydroxide-Simeth (MAGIC MOUTHWASH W/LIDOCAINE) SOLN Swish swallow as needed for mouth/throat soreness Qty: 473 mL, Refills: 2    HYDROcodone-acetaminophen (NORCO/VICODIN) 5-325 MG per tablet Take 1-2 tablets by mouth every 6 (six) hours as needed. Qty: 30 tablet, Refills: 0    loratadine (CLARITIN) 10 MG tablet Take 10 mg by mouth daily as needed for allergies.    LORazepam (ATIVAN) 0.5 MG tablet Place 1-2 tabs under tongue or swallow every 6 hrs as needed for nausea.  Take night of chemo whether or not any nausea.  Will make Drowsy Qty: 30 tablet, Refills: 0   Associated Diagnoses: Breast cancer of  upper-inner quadrant of left female breast    ondansetron (ZOFRAN) 8 MG tablet Take by mouth 2 (two) times daily.    pyridOXINE (VITAMIN B-6) 50 MG tablet Take 50 mg by mouth 2 (two) times daily.    traMADol (ULTRAM) 50 MG tablet Take 1 tablet (50 mg total) by mouth every 6 (six) hours as needed. Qty: 30 tablet, Refills: 0   Associated Diagnoses: Breast cancer of upper-inner quadrant of left female breast    triamcinolone (KENALOG) 0.1 % paste Apply twice a day to ulcers on lips Qty: 5 g, Refills: 1   Associated Diagnoses: Breast cancer of upper-inner quadrant of left female breast      STOP taking these medications     prochlorperazine (COMPAZINE) 10 MG tablet        Allergies  Allergen Reactions  . Taxotere [Docetaxel] Itching and Rash   Follow-up Information   Follow up with LIVESAY,LENNIS P, MD In 1 week.   Specialty:  Oncology   Contact information:   [address] [place] [address] [phone]        The results of significant diagnostics from this hospitalization (including imaging, microbiology, ancillary and laboratory) are listed below for reference.  Significant Diagnostic Studies: Ct Head Wo Contrast  07/29/2014   CLINICAL DATA:  Altered mental status, known breast cancer.  EXAM: CT HEAD WITHOUT CONTRAST  TECHNIQUE: Contiguous axial images were obtained from the base of the skull through the vertex without intravenous contrast.  COMPARISON:  None.  FINDINGS: Patient was unable to remain in proper positioning, resulting in a extremely tilted image acquisition.  Mildly motion degraded examination.  The ventricles and sulci are normal. No intraparenchymal hemorrhage, mass effect nor midline shift. No acute large vascular territory infarcts.  No abnormal extra-axial fluid collections. Basal cisterns are patent.  No skull fracture. The included ocular globes and orbital contents are non-suspicious. The mastoid aircells and included paranasal sinuses are  well-aerated.  IMPRESSION: Suboptimal image acquisition without convincing evidence of acute intracranial process.   Electronically Signed   By: Elon Alas   On: 07/29/2014 03:55    Microbiology: No results found for this or any previous visit (from the past 240 hour(s)).   Labs: Basic Metabolic Panel:  Recent Labs Lab 07/29/14 0258 07/30/14 0526  NA 140 143  K 4.5 3.7  CL 102 109  CO2 22 24  GLUCOSE 143* 113*  BUN 19 7  CREATININE 0.93 0.65  CALCIUM 10.1 8.9   Liver Function Tests:  Recent Labs Lab 07/29/14 0258 07/30/14 0526  AST 42* 40*  ALT 68* 59*  ALKPHOS 106 78  BILITOT 1.2 0.6  PROT 7.9 5.8*  ALBUMIN 4.7 3.4*   No results found for this basename: LIPASE, AMYLASE,  in the last 168 hours  Recent Labs Lab 07/29/14 1513  AMMONIA 18   CBC:  Recent Labs Lab 07/29/14 0258 07/30/14 0526  WBC 8.1 3.4*  NEUTROABS 7.1  --   HGB 14.7 10.6*  HCT 41.7 31.1*  MCV 91.9 95.7  PLT 168 125*   Cardiac Enzymes: No results found for this basename: CKTOTAL, CKMB, CKMBINDEX, TROPONINI,  in the last 168 hours BNP: BNP (last 3 results) No results found for this basename: PROBNP,  in the last 8760 hours CBG: No results found for this basename: GLUCAP,  in the last 168 hours     Signed:  Josealfredo Adkins A  [facility] 07/30/2014, 11:30 AM

## 2014-07-31 ENCOUNTER — Ambulatory Visit: Payer: BC Managed Care – PPO

## 2014-08-01 ENCOUNTER — Inpatient Hospital Stay (HOSPITAL_COMMUNITY)
Admission: EM | Admit: 2014-08-01 | Discharge: 2014-08-04 | DRG: 314 | Disposition: A | Discharge status: Home / Self Care | Payer: BC Managed Care – PPO | Attending: Internal Medicine | Admitting: Internal Medicine

## 2014-08-01 ENCOUNTER — Encounter (HOSPITAL_COMMUNITY): Payer: Self-pay | Admitting: Emergency Medicine

## 2014-08-01 DIAGNOSIS — T80218A Other infection due to central venous catheter, initial encounter: Principal | ICD-10-CM | POA: Diagnosis present

## 2014-08-01 DIAGNOSIS — T451X5A Adverse effect of antineoplastic and immunosuppressive drugs, initial encounter: Secondary | ICD-10-CM | POA: Diagnosis present

## 2014-08-01 DIAGNOSIS — D696 Thrombocytopenia, unspecified: Secondary | ICD-10-CM | POA: Diagnosis not present

## 2014-08-01 DIAGNOSIS — D61810 Antineoplastic chemotherapy induced pancytopenia: Secondary | ICD-10-CM | POA: Diagnosis present

## 2014-08-01 DIAGNOSIS — Z888 Allergy status to other drugs, medicaments and biological substances status: Secondary | ICD-10-CM | POA: Diagnosis not present

## 2014-08-01 DIAGNOSIS — L02219 Cutaneous abscess of trunk, unspecified: Secondary | ICD-10-CM

## 2014-08-01 DIAGNOSIS — C50219 Malignant neoplasm of upper-inner quadrant of unspecified female breast: Secondary | ICD-10-CM | POA: Diagnosis not present

## 2014-08-01 DIAGNOSIS — Z87891 Personal history of nicotine dependence: Secondary | ICD-10-CM | POA: Diagnosis not present

## 2014-08-01 DIAGNOSIS — H918X9 Other specified hearing loss, unspecified ear: Secondary | ICD-10-CM | POA: Diagnosis present

## 2014-08-01 DIAGNOSIS — T827XXA Infection and inflammatory reaction due to other cardiac and vascular devices, implants and grafts, initial encounter: Secondary | ICD-10-CM | POA: Diagnosis not present

## 2014-08-01 DIAGNOSIS — Z95828 Presence of other vascular implants and grafts: Secondary | ICD-10-CM

## 2014-08-01 DIAGNOSIS — Z853 Personal history of malignant neoplasm of breast: Secondary | ICD-10-CM | POA: Diagnosis not present

## 2014-08-01 DIAGNOSIS — Z923 Personal history of irradiation: Secondary | ICD-10-CM | POA: Diagnosis not present

## 2014-08-01 DIAGNOSIS — L03319 Cellulitis of trunk, unspecified: Secondary | ICD-10-CM | POA: Diagnosis present

## 2014-08-01 DIAGNOSIS — Z171 Estrogen receptor negative status [ER-]: Secondary | ICD-10-CM

## 2014-08-01 DIAGNOSIS — H905 Unspecified sensorineural hearing loss: Secondary | ICD-10-CM | POA: Diagnosis present

## 2014-08-01 DIAGNOSIS — Y849 Medical procedure, unspecified as the cause of abnormal reaction of the patient, or of later complication, without mention of misadventure at the time of the procedure: Secondary | ICD-10-CM | POA: Diagnosis present

## 2014-08-01 DIAGNOSIS — E785 Hyperlipidemia, unspecified: Secondary | ICD-10-CM | POA: Diagnosis present

## 2014-08-01 DIAGNOSIS — C50212 Malignant neoplasm of upper-inner quadrant of left female breast: Secondary | ICD-10-CM

## 2014-08-01 DIAGNOSIS — D709 Neutropenia, unspecified: Secondary | ICD-10-CM | POA: Diagnosis not present

## 2014-08-01 DIAGNOSIS — R569 Unspecified convulsions: Secondary | ICD-10-CM | POA: Diagnosis present

## 2014-08-01 DIAGNOSIS — L03313 Cellulitis of chest wall: Secondary | ICD-10-CM | POA: Diagnosis present

## 2014-08-01 DIAGNOSIS — H9190 Unspecified hearing loss, unspecified ear: Secondary | ICD-10-CM | POA: Diagnosis not present

## 2014-08-01 DIAGNOSIS — C50919 Malignant neoplasm of unspecified site of unspecified female breast: Secondary | ICD-10-CM | POA: Diagnosis not present

## 2014-08-01 DIAGNOSIS — R222 Localized swelling, mass and lump, trunk: Secondary | ICD-10-CM | POA: Diagnosis present

## 2014-08-01 DIAGNOSIS — Z79899 Other long term (current) drug therapy: Secondary | ICD-10-CM | POA: Diagnosis not present

## 2014-08-01 LAB — CBC WITH DIFFERENTIAL/PLATELET
BASOS ABS: 0 10*3/uL (ref 0.0–0.1)
Basophils Relative: 0 % (ref 0–1)
EOS PCT: 1 % (ref 0–5)
Eosinophils Absolute: 0 10*3/uL (ref 0.0–0.7)
HCT: 30.9 % — ABNORMAL LOW (ref 36.0–46.0)
Hemoglobin: 10.9 g/dL — ABNORMAL LOW (ref 12.0–15.0)
LYMPHS PCT: 15 % (ref 12–46)
Lymphs Abs: 0.6 10*3/uL — ABNORMAL LOW (ref 0.7–4.0)
MCH: 32.7 pg (ref 26.0–34.0)
MCHC: 35.3 g/dL (ref 30.0–36.0)
MCV: 92.8 fL (ref 78.0–100.0)
Monocytes Absolute: 0.3 10*3/uL (ref 0.1–1.0)
Monocytes Relative: 8 % (ref 3–12)
NEUTROS ABS: 3 10*3/uL (ref 1.7–7.7)
Neutrophils Relative %: 76 % (ref 43–77)
Platelets: 158 10*3/uL (ref 150–400)
RBC: 3.33 MIL/uL — ABNORMAL LOW (ref 3.87–5.11)
RDW: 13.1 % (ref 11.5–15.5)
WBC: 4 10*3/uL (ref 4.0–10.5)

## 2014-08-01 LAB — COMPREHENSIVE METABOLIC PANEL
ALT: 58 U/L — AB (ref 0–35)
AST: 30 U/L (ref 0–37)
Albumin: 3.8 g/dL (ref 3.5–5.2)
Alkaline Phosphatase: 98 U/L (ref 39–117)
Anion gap: 13 (ref 5–15)
BUN: 11 mg/dL (ref 6–23)
CHLORIDE: 102 meq/L (ref 96–112)
CO2: 25 meq/L (ref 19–32)
Calcium: 9.2 mg/dL (ref 8.4–10.5)
Creatinine, Ser: 0.7 mg/dL (ref 0.50–1.10)
GFR calc Af Amer: >90 mL/min (ref >90)
Glucose, Bld: 100 mg/dL — ABNORMAL HIGH (ref 70–99)
Potassium: 3.5 mEq/L — ABNORMAL LOW (ref 3.7–5.3)
Sodium: 140 mEq/L (ref 137–147)
Total Bilirubin: 0.4 mg/dL (ref 0.3–1.2)
Total Protein: 6.4 g/dL (ref 6.0–8.3)

## 2014-08-01 LAB — URINALYSIS, ROUTINE W REFLEX MICROSCOPIC
Bilirubin Urine: NEGATIVE
GLUCOSE, UA: NEGATIVE mg/dL
Ketones, ur: NEGATIVE mg/dL
Nitrite: NEGATIVE
PH: 6 (ref 5.0–8.0)
PROTEIN: NEGATIVE mg/dL
Specific Gravity, Urine: 1.025 (ref 1.005–1.030)
Urobilinogen, UA: 1 mg/dL (ref 0.0–1.0)

## 2014-08-01 LAB — URINE MICROSCOPIC-ADD ON

## 2014-08-01 LAB — I-STAT CG4 LACTIC ACID, ED: LACTIC ACID, VENOUS: 0.63 mmol/L (ref 0.5–2.2)

## 2014-08-01 MED ORDER — ACETAMINOPHEN 325 MG PO TABS
650.0000 mg | ORAL_TABLET | Freq: Four times a day (QID) | ORAL | Status: DC | PRN
Start: 2014-08-01 — End: 2014-08-04

## 2014-08-01 MED ORDER — SODIUM CHLORIDE 0.9 % IV SOLN
INTRAVENOUS | Status: DC
  Administered 2014-08-02: 03:00:00 via INTRAVENOUS

## 2014-08-01 MED ORDER — SODIUM CHLORIDE 0.9 % IV SOLN
1000.0000 mL | Freq: Once | INTRAVENOUS | Status: AC
  Administered 2014-08-01: 1000 mL via INTRAVENOUS

## 2014-08-01 MED ORDER — SODIUM CHLORIDE 0.9 % IV SOLN
1000.0000 mL | INTRAVENOUS | Status: DC
  Administered 2014-08-01: 1000 mL via INTRAVENOUS

## 2014-08-01 MED ORDER — ONDANSETRON HCL 4 MG PO TABS: 8.0000 mg | ORAL_TABLET | Freq: Three times a day (TID) | ORAL | Status: DC | PRN

## 2014-08-01 MED ORDER — PIPERACILLIN-TAZOBACTAM 3.375 G IVPB 30 MIN
3.3750 g | Freq: Once | INTRAVENOUS | Status: AC
  Administered 2014-08-01: 3.375 g via INTRAVENOUS
  Filled 2014-08-01: qty 50

## 2014-08-01 MED ORDER — ONDANSETRON HCL 4 MG PO TABS: 4.0000 mg | ORAL_TABLET | Freq: Four times a day (QID) | ORAL | Status: DC | PRN

## 2014-08-01 MED ORDER — PIPERACILLIN-TAZOBACTAM 3.375 G IVPB: 3.3750 g | Freq: Once | INTRAVENOUS | Status: DC

## 2014-08-01 MED ORDER — VANCOMYCIN HCL IN DEXTROSE 1-5 GM/200ML-% IV SOLN
1000.0000 mg | Freq: Once | INTRAVENOUS | Status: AC
  Administered 2014-08-01: 1000 mg via INTRAVENOUS
  Filled 2014-08-01: qty 200

## 2014-08-01 MED ORDER — ENOXAPARIN SODIUM 40 MG/0.4ML ~~LOC~~ SOLN
40.0000 mg | Freq: Every day | SUBCUTANEOUS | Status: DC
  Administered 2014-08-02 – 2014-08-03 (×3): 40 mg via SUBCUTANEOUS
  Filled 2014-08-01 (×4): qty 0.4

## 2014-08-01 MED ORDER — SERTRALINE HCL 100 MG PO TABS
100.0000 mg | ORAL_TABLET | Freq: Every day | ORAL | Status: DC
  Administered 2014-08-03 – 2014-08-04 (×2): 100 mg via ORAL
  Filled 2014-08-01 (×3): qty 1

## 2014-08-01 MED ORDER — VANCOMYCIN HCL IN DEXTROSE 1-5 GM/200ML-% IV SOLN
1000.0000 mg | Freq: Two times a day (BID) | INTRAVENOUS | Status: DC
  Administered 2014-08-02 (×2): 1000 mg via INTRAVENOUS
  Filled 2014-08-01 (×3): qty 200

## 2014-08-01 MED ORDER — ONDANSETRON HCL 4 MG/2ML IJ SOLN: 4.0000 mg | Freq: Four times a day (QID) | INTRAMUSCULAR | Status: DC | PRN

## 2014-08-01 MED ORDER — PIPERACILLIN-TAZOBACTAM 3.375 G IVPB
3.3750 g | Freq: Three times a day (TID) | INTRAVENOUS | Status: DC
  Administered 2014-08-02 (×2): 3.375 g via INTRAVENOUS
  Filled 2014-08-01 (×4): qty 50

## 2014-08-01 MED ORDER — HYDROCODONE-ACETAMINOPHEN 5-325 MG PO TABS: 1.0000 | ORAL_TABLET | Freq: Four times a day (QID) | ORAL | Status: DC | PRN

## 2014-08-01 MED ORDER — ACETAMINOPHEN 650 MG RE SUPP: 650.0000 mg | Freq: Four times a day (QID) | RECTAL | Status: DC | PRN

## 2014-08-01 MED ORDER — SODIUM CHLORIDE 0.9 % IV SOLN: INTRAVENOUS | Status: DC

## 2014-08-01 NOTE — ED Notes (Signed)
Pt hx of breast cancer, last chemo was 9/14. Pt is deaf, family translating for her. Pt was seen in ED on Sunday, family reports she was delusional and staff had to sedate pt. pts port was accessed on Sunday-Monday. Pt reports she noticed redness to right breast area around port today. Soreness x2 days. Pain 3/10. Area is very red.

## 2014-08-01 NOTE — ED Notes (Signed)
Sign language interpretor requested. Husband interpreting for patient at this time

## 2014-08-01 NOTE — ED Provider Notes (Signed)
CSN: [id number]     Arrival date & time 08/01/14  8588 History   First MD Initiated Contact with Patient 08/01/14 1902     Chief Complaint  Patient presents with  . ca pt, infected breast/ port area    Patient is deaf, her family is here to interpret  (Consider location/radiation/quality/duration/timing/severity/associated sxs/prior Treatment) HPI Patient was seen 3 nights ago when she was having delusional thoughts and agitation. She had been on steroids for her chemotherapy and had not slept for 3 days. She had become paranoid and uncooperative. Her family reports that she was struggling and had to be held down. She has had a port on her right chest since April. She reports yesterday she had some soreness in the area but got progressively worse today. She states she looked in a mirror this afternoon and noted it was red and swollen and has gotten progressively worse during the day. She's had a low-grade fever of 99 without chills. Pt is getting chemotherapy for breast cancer.   PCP Dr Lysle Rubens Oncology Dr Marko Plume  Past Medical History  Diagnosis Date  . Congenital deafness   . Hyperlipidemia   . Breast cancer JANUARY 2002    RIGHT BREAST T4 SENTINEL NODE NEGATIVE, HER2 3+, ER/PR NEGATIVE  . Breast cancer February 2015    Left Breast T1N0  . Hx of radiation therapy   . Hx antineoplastic chemo   . Wears glasses     to drive   Past Surgical History  Procedure Laterality Date  . Tonsillectomy    . Breast surgery  2002    lumpectomy on right side  . Cesarean section    . Breast lumpectomy with needle localization and axillary sentinel lymph node bx Left 03/06/2014    Procedure: BREAST LUMPECTOMY WITH NEEDLE LOCALIZATION AND AXILLARY SENTINEL LYMPH NODE BIOPSY;  Surgeon: Shann Medal, MD;  Location: [place];  Service: General;  Laterality: Left;  . Portacath placement Right 03/06/2014    Procedure: INSERTION PORT-A-CATH;  Surgeon: Shann Medal, MD;  Location:  [place];  Service: General;  Laterality: Right;  subclavian   Family History  Problem Relation Age of Onset  . Heart disease Mother   . Hypertension Mother   . Hyperlipidemia Mother   . Deafness Mother   . Liver cancer Maternal Aunt   . Liver cancer Maternal Grandfather   . Deafness Father   . Prostate cancer Father 29  . Deafness Sister   . Deafness Paternal Uncle   . Breast cancer Cousin     maternal cousin dx over 75  . Deafness Maternal Aunt   . Leukemia Cousin 2   History  Substance Use Topics  . Smoking status: Former Smoker -- 3 years    Quit date: 01/11/1994  . Smokeless tobacco: Never Used  . Alcohol Use: Yes     Comment: very very seldom   Lives at home Lives with spouse  OB History   Grav Para Term Preterm Abortions TAB SAB Ect Mult Living                 Review of Systems  All other systems reviewed and are negative.     Allergies  Taxotere  Home Medications   Prior to Admission medications   Medication Sig Start Date End Date Taking? Authorizing Provider  Alum & Mag Hydroxide-Simeth (MAGIC MOUTHWASH W/LIDOCAINE) SOLN Swish swallow as needed for mouth/throat soreness 04/09/14  Yes Lennis Marion Downer, MD  dexamethasone (DECADRON) 4 MG tablet Take five tablets with food 12hrs before taxol chemotherapy 07/02/14  Yes Lennis Marion Downer, MD  HYDROcodone-acetaminophen (NORCO/VICODIN) 5-325 MG per tablet Take 1 tablet by mouth every 6 (six) hours as needed for moderate pain.   Yes Historical Provider, MD  lidocaine-prilocaine (EMLA) cream Apply to PortaCath  1-2 hrs prior to access as directed. 07/02/14  Yes Lennis Marion Downer, MD  loratadine (CLARITIN) 10 MG tablet Take 10 mg by mouth daily as needed for allergies.   Yes Historical Provider, MD  LORazepam (ATIVAN) 0.5 MG tablet Place 1-2 tabs under tongue or swallow every 6 hrs as needed for nausea.  Take night of chemo whether or not any nausea.  Will make Drowsy 03/21/14  Yes Lennis P Livesay, MD    ondansetron (ZOFRAN) 8 MG tablet Take by mouth 2 (two) times daily.   Yes Historical Provider, MD  PRESCRIPTION MEDICATION Chemotherapy at [facility]   Yes Historical Provider, MD  pyridOXINE (VITAMIN B-6) 50 MG tablet Take 50 mg by mouth 2 (two) times daily.   Yes Historical Provider, MD  sertraline (ZOLOFT) 100 MG tablet Take 1 tablet (100 mg total) by mouth daily. 07/02/14  Yes Gordy Levan, MD   ED Triage Vitals  Enc Vitals Group     BP 08/01/14 1849 123/75 mmHg     Pulse Rate 08/01/14 1849 95     Resp 08/01/14 1849 16     Temp 08/01/14 1849 99.2 F (37.3 C)     Temp src 08/01/14 1849 Oral     SpO2 08/01/14 1849 96 %     Weight --      Height --      Head Cir --      Peak Flow --      Pain Score 08/01/14 1849 3     Pain Loc --      Pain Edu? --      Excl. in [place]? --      Vital signs normal except for low-grade fever  Physical Exam  Nursing note and vitals reviewed. Constitutional: She is oriented to person, place, and time. She appears well-developed and well-nourished.  Non-toxic appearance. She does not appear ill. No distress.  HENT:  Head: Normocephalic and atraumatic.  Right Ear: External ear normal.  Left Ear: External ear normal.  Nose: Nose normal. No mucosal edema or rhinorrhea.  Mouth/Throat: Oropharynx is clear and moist and mucous membranes are normal. No dental abscesses or uvula swelling.  Eyes: Conjunctivae and EOM are normal. Pupils are equal, round, and reactive to light.  Neck: Normal range of motion and full passive range of motion without pain. Neck supple.  Cardiovascular: Normal rate, regular rhythm and normal heart sounds.  Exam reveals no gallop and no friction rub.   No murmur heard. Pulmonary/Chest: Effort normal and breath sounds normal. No respiratory distress. She has no wheezes. She has no rhonchi. She has no rales. She exhibits tenderness. She exhibits no crepitus.  Patient has large area of redness and swelling around her right port. See  photo  Abdominal: Soft. Normal appearance and bowel sounds are normal. She exhibits no distension. There is no tenderness. There is no rebound and no guarding.  Musculoskeletal: Normal range of motion. She exhibits no edema and no tenderness.  Moves all extremities well.   Neurological: She is alert and oriented to person, place, and time. She has normal strength. No cranial nerve deficit.  Skin: Skin is warm, dry and intact.  No rash noted. No erythema. No pallor.  Psychiatric: She has a normal mood and affect. Her behavior is normal. Her mood appears not anxious.  Husband interprets her sign language         ED Course  Procedures (including critical care time)  Medications  0.9 %  sodium chloride infusion (not administered)  0.9 %  sodium chloride infusion (0 mLs Intravenous Stopped 08/01/14 2143)  vancomycin (VANCOCIN) IVPB 1000 mg/200 mL premix (0 mg Intravenous Stopped 08/01/14 2143)  piperacillin-tazobactam (ZOSYN) IVPB 3.375 g (3.375 g Intravenous New Bag/Given 08/01/14 2143)   We discussed that she has an infection, most likely cellulitis. It is not clear at this time if her actual port is also infected. She is agreeable to being admitted to get IV antibiotics.  21:22 Dr Posey Pronto, admit to [place] Results for orders placed during the hospital encounter of 08/01/14  CBC WITH DIFFERENTIAL      Result Value Ref Range   WBC 4.0  4.0 - 10.5 K/uL   RBC 3.33 (*) 3.87 - 5.11 MIL/uL   Hemoglobin 10.9 (*) 12.0 - 15.0 g/dL   HCT 30.9 (*) 36.0 - 46.0 %   MCV 92.8  78.0 - 100.0 fL   MCH 32.7  26.0 - 34.0 pg   MCHC 35.3  30.0 - 36.0 g/dL   RDW 13.1  11.5 - 15.5 %   Platelets 158  150 - 400 K/uL   Neutrophils Relative % 76  43 - 77 %   Neutro Abs 3.0  1.7 - 7.7 K/uL   Lymphocytes Relative 15  12 - 46 %   Lymphs Abs 0.6 (*) 0.7 - 4.0 K/uL   Monocytes Relative 8  3 - 12 %   Monocytes Absolute 0.3  0.1 - 1.0 K/uL   Eosinophils Relative 1  0 - 5 %   Eosinophils Absolute  0.0  0.0 - 0.7 K/uL   Basophils Relative 0  0 - 1 %   Basophils Absolute 0.0  0.0 - 0.1 K/uL  COMPREHENSIVE METABOLIC PANEL      Result Value Ref Range   Sodium 140  137 - 147 mEq/L   Potassium 3.5 (*) 3.7 - 5.3 mEq/L   Chloride 102  96 - 112 mEq/L   CO2 25  19 - 32 mEq/L   Glucose, Bld 100 (*) 70 - 99 mg/dL   BUN 11  6 - 23 mg/dL   Creatinine, Ser 0.70  0.50 - 1.10 mg/dL   Calcium 9.2  8.4 - 10.5 mg/dL   Total Protein 6.4  6.0 - 8.3 g/dL   Albumin 3.8  3.5 - 5.2 g/dL   AST 30  0 - 37 U/L   ALT 58 (*) 0 - 35 U/L   Alkaline Phosphatase 98  39 - 117 U/L   Total Bilirubin 0.4  0.3 - 1.2 mg/dL   GFR calc non Af Amer >90  >90 mL/min   GFR calc Af Amer >90  >90 mL/min   Anion gap 13  5 - 15  URINALYSIS, ROUTINE W REFLEX MICROSCOPIC      Result Value Ref Range   Color, Urine YELLOW  YELLOW   APPearance CLOUDY (*) CLEAR   Specific Gravity, Urine 1.025  1.005 - 1.030   pH 6.0  5.0 - 8.0   Glucose, UA NEGATIVE  NEGATIVE mg/dL   Hgb urine dipstick TRACE (*) NEGATIVE   Bilirubin Urine NEGATIVE  NEGATIVE   Ketones, ur NEGATIVE  NEGATIVE  mg/dL   Protein, ur NEGATIVE  NEGATIVE mg/dL   Urobilinogen, UA 1.0  0.0 - 1.0 mg/dL   Nitrite NEGATIVE  NEGATIVE   Leukocytes, UA MODERATE (*) NEGATIVE  URINE MICROSCOPIC-ADD ON      Result Value Ref Range   Squamous Epithelial / LPF RARE  RARE   WBC, UA 21-50  <3 WBC/hpf   RBC / HPF 0-2  <3 RBC/hpf   Bacteria, UA FEW (*) RARE  I-STAT CG4 LACTIC ACID, ED      Result Value Ref Range   Lactic Acid, Venous 0.63  0.5 - 2.2 mmol/L    Laboratory interpretation all normal except possible UTI, mild anemia    Imaging Review No results found.   EKG Interpretation None      MDM   Final diagnoses:  Cellulitis of chest wall    Plan admission   Rolland Porter, MD, Alanson Aly, MD 08/01/14 2306

## 2014-08-01 NOTE — Progress Notes (Signed)
ANTIBIOTIC CONSULT NOTE - INITIAL  Pharmacy Consult for Zosyn and Vancomycin Indication: Cellulitis of chest wall  Allergies  Allergen Reactions  . Taxotere [Docetaxel] Itching and Rash    Patient Measurements: Weight: 160 lb 8 oz (72.802 kg)   Vital Signs: Temp: 98.4 F (36.9 C) (09/23 2238) Temp src: Oral (09/23 2238) BP: 136/85 mmHg (09/23 2238) Pulse Rate: 85 (09/23 2238) Intake/Output from previous day:   Intake/Output from this shift: Total I/O In: -  Out: 400 [Urine:400]  Labs:  Recent Labs  07/30/14 0526 08/01/14 1954  WBC 3.4* 4.0  HGB 10.6* 10.9*  PLT 125* 158  CREATININE 0.65 0.70   The CrCl is unknown because both a height and weight (above a minimum accepted value) are required for this calculation. No results found for this basename: VANCOTROUGH, VANCOPEAK, VANCORANDOM, GENTTROUGH, GENTPEAK, GENTRANDOM, TOBRATROUGH, TOBRAPEAK, TOBRARND, AMIKACINPEAK, AMIKACINTROU, AMIKACIN,  in the last 72 hours   Microbiology: No results found for this or any previous visit (from the past 720 hour(s)).  Medical History: Past Medical History  Diagnosis Date  . Congenital deafness   . Hyperlipidemia   . Breast cancer JANUARY 2002    RIGHT BREAST T4 SENTINEL NODE NEGATIVE, HER2 3+, ER/PR NEGATIVE  . Breast cancer February 2015    Left Breast T1N0  . Hx of radiation therapy   . Hx antineoplastic chemo   . Wears glasses     to drive    Medications:  Scheduled:  . enoxaparin (LOVENOX) injection  40 mg Subcutaneous QHS  . [START ON 08/02/2014] sertraline  100 mg Oral Daily   Infusions:  . sodium chloride     Assessment: 42 yoF with hx of breast Ca admitted with altered mental status.  Zosyn and Vancomycin per Rx for cellulitis of chest wall.   Goal of Therapy:  Vancomycin trough level 10-15 mcg/ml  Plan:   Zosyn 3.375 Gm IV q8h EI  Vancomycin 1Gm IV q12h  F/u Scr/levels/cultures as needed  Dorrene German 08/01/2014,11:31 PM

## 2014-08-01 NOTE — ED Notes (Signed)
Dr.Knapp at bedside  

## 2014-08-01 NOTE — H&P (Signed)
[facility] History and Physical  Patient: Regina Watts  IRC:[id number]  DOB: July 13, 1961  DOS: the patient was seen and examined on 08/01/2014 PCP: Wenda Low, MD  Chief Complaint: Chest wall redness  HPI: Regina Watts is a 53 y.o. female with Past medical history of breast cancer with Port-A-Cath placement with ongoing chemotherapy, dyslipidemia, deafness. The patient presented with complaints of right chest wall swelling and redness which has been present since last 2 days. She mentions she yesterday she noted the right chest area had a swelling which is painful to touch. Today when she woke up she's noticed that there was some redness around that and it was progressively worsening. She had a Port-A-Cath placement on the same side in April 15 after her surgery for left breast. She is ongoing with chemotherapy on a weekly basis although her last chemotherapy was on earlier in September. She denies any fever or chills with complain of some fatigue he did she denies any vomiting but complains of some nausea. No chest pain no shortness of breath no dizziness no lightheadedness no diarrhea no burning urination. No recent change in her medication. Last time Port-A-Cath was accessed on Monday.  The patient is coming from home. And at her baseline independent for most of her ADL.  Review of Systems: as mentioned in the history of present illness.  A Comprehensive review of the other systems is negative.  Past Medical History  Diagnosis Date  . Congenital deafness   . Hyperlipidemia   . Breast cancer JANUARY 2002    RIGHT BREAST T4 SENTINEL NODE NEGATIVE, HER2 3+, ER/PR NEGATIVE  . Breast cancer February 2015    Left Breast T1N0  . Hx of radiation therapy   . Hx antineoplastic chemo   . Wears glasses     to drive   Past Surgical History  Procedure Laterality Date  . Tonsillectomy    . Breast surgery  2002    lumpectomy on right side  . Cesarean section    . Breast lumpectomy  with needle localization and axillary sentinel lymph node bx Left 03/06/2014    Procedure: BREAST LUMPECTOMY WITH NEEDLE LOCALIZATION AND AXILLARY SENTINEL LYMPH NODE BIOPSY;  Surgeon: Shann Medal, MD;  Location: [place];  Service: General;  Laterality: Left;  . Portacath placement Right 03/06/2014    Procedure: INSERTION PORT-A-CATH;  Surgeon: Shann Medal, MD;  Location: [place];  Service: General;  Laterality: Right;  subclavian   Social History:  reports that she quit smoking about 20 years ago. She has never used smokeless tobacco. She reports that she drinks alcohol. She reports that she does not use illicit drugs.  Allergies  Allergen Reactions  . Taxotere [Docetaxel] Itching and Rash    Family History  Problem Relation Age of Onset  . Heart disease Mother   . Hypertension Mother   . Hyperlipidemia Mother   . Deafness Mother   . Liver cancer Maternal Aunt   . Liver cancer Maternal Grandfather   . Deafness Father   . Prostate cancer Father 49  . Deafness Sister   . Deafness Paternal Uncle   . Breast cancer Cousin     maternal cousin dx over 65  . Deafness Maternal Aunt   . Leukemia Cousin 2    Prior to Admission medications   Medication Sig Start Date End Date Taking? Authorizing Provider  Alum & Mag Hydroxide-Simeth (MAGIC MOUTHWASH W/LIDOCAINE) SOLN Swish swallow as needed for mouth/throat soreness  04/09/14  Yes Lennis Marion Downer, MD  dexamethasone (DECADRON) 4 MG tablet Take five tablets with food 12hrs before taxol chemotherapy 07/02/14  Yes Lennis Marion Downer, MD  HYDROcodone-acetaminophen (NORCO/VICODIN) 5-325 MG per tablet Take 1 tablet by mouth every 6 (six) hours as needed for moderate pain.   Yes Historical Provider, MD  lidocaine-prilocaine (EMLA) cream Apply to PortaCath  1-2 hrs prior to access as directed. 07/02/14  Yes Lennis Marion Downer, MD  loratadine (CLARITIN) 10 MG tablet Take 10 mg by mouth daily as needed for allergies.    Yes Historical Provider, MD  LORazepam (ATIVAN) 0.5 MG tablet Place 1-2 tabs under tongue or swallow every 6 hrs as needed for nausea.  Take night of chemo whether or not any nausea.  Will make Drowsy 03/21/14  Yes Lennis P Livesay, MD  ondansetron (ZOFRAN) 8 MG tablet Take by mouth 2 (two) times daily.   Yes Historical Provider, MD  PRESCRIPTION MEDICATION Chemotherapy at [facility]   Yes Historical Provider, MD  pyridOXINE (VITAMIN B-6) 50 MG tablet Take 50 mg by mouth 2 (two) times daily.   Yes Historical Provider, MD  sertraline (ZOLOFT) 100 MG tablet Take 1 tablet (100 mg total) by mouth daily. 07/02/14  Yes Gordy Levan, MD    Physical Exam: Filed Vitals:   08/01/14 2100 08/01/14 2130 08/01/14 2228 08/01/14 2238  BP: 138/80 141/77  136/85  Pulse: 84 90  85  Temp:    98.4 F (36.9 C)  TempSrc:    Oral  Resp:    18  Weight:   72.802 kg (160 lb 8 oz)   SpO2: 99% 98%  97%    General: Alert, Awake and Oriented to Time, Place and Person. Appear in mild distress Eyes: PERRL ENT: Oral Mucosa clear moist. Neck: No JVD Cardiovascular: S1 and S2 Present, no Murmur, Peripheral Pulses Present Respiratory: Bilateral Air entry equal and Decreased, Clear to Auscultation, no Crackles, no wheezes Abdomen: Bowel Sound present, Soft and Non tender Skin: Right chest wall redness with warmth, no other Rash Extremities: Bilateral Pedal edema, no calf tenderness Neurologic: Grossly no focal neuro deficit.  Labs on Admission:  CBC:  Recent Labs Lab 07/29/14 0258 07/30/14 0526 08/01/14 1954  WBC 8.1 3.4* 4.0  NEUTROABS 7.1  --  3.0  HGB 14.7 10.6* 10.9*  HCT 41.7 31.1* 30.9*  MCV 91.9 95.7 92.8  PLT 168 125* 158    CMP     Component Value Date/Time   NA 140 08/01/2014 1954   NA 139 07/23/2014 1027   K 3.5* 08/01/2014 1954   K 4.8 07/23/2014 1027   CL 102 08/01/2014 1954   CO2 25 08/01/2014 1954   CO2 21* 07/23/2014 1027   GLUCOSE 100* 08/01/2014 1954   GLUCOSE 194* 07/23/2014 1027   BUN 11  08/01/2014 1954   BUN 11.7 07/23/2014 1027   CREATININE 0.70 08/01/2014 1954   CREATININE 0.8 07/23/2014 1027   CALCIUM 9.2 08/01/2014 1954   CALCIUM 9.7 07/23/2014 1027   PROT 6.4 08/01/2014 1954   PROT 7.3 07/23/2014 1027   ALBUMIN 3.8 08/01/2014 1954   ALBUMIN 4.2 07/23/2014 1027   AST 30 08/01/2014 1954   AST 30 07/23/2014 1027   ALT 58* 08/01/2014 1954   ALT 53 07/23/2014 1027   ALKPHOS 98 08/01/2014 1954   ALKPHOS 101 07/23/2014 1027   BILITOT 0.4 08/01/2014 1954   BILITOT 0.71 07/23/2014 1027   GFRNONAA >90 08/01/2014 1954   GFRAA >90 08/01/2014 1954  No results found for this basename: LIPASE, AMYLASE,  in the last 168 hours  Recent Labs Lab 07/29/14 1513  AMMONIA 18    No results found for this basename: CKTOTAL, CKMB, CKMBINDEX, TROPONINI,  in the last 168 hours BNP (last 3 results) No results found for this basename: PROBNP,  in the last 8760 hours  Radiological Exams on Admission: No results found.  Assessment/Plan Principal Problem:   Cellulitis of chest wall Active Problems:   Breast cancer of upper-inner quadrant of left female breast   Deafness congenital   Port-a-cath in place   1. Cellulitis of chest wall The patient is presenting with complaints of right chest wall redness and warmth. She is probably has of a cellulitis of the area. It is unclear whether Port-A-Cath site is involved or not. She does not appear to have significant swelling suggesting any fluid loculation. With this the patient will be admitted in the med surge unit. I would continue her with broad spectrum antibiotics IV vancomycin and Zosyn in view of her chemotherapy immunosuppressed status. Follow blood cultures. Check ESR and CRP.  Consults: Oncology morning  DVT Prophylaxis: subcutaneous Heparin Nutrition:  n.p.o. except medication for possible procedure  Code Status:  full  Disposition: Admitted to inpatient in med-surge unit.  Author: Berle Mull, MD [facility] Pager:  [phone] 08/01/2014, 10:40 PM    If 7PM-7AM, please contact night-coverage [email or web address] Password [email or web address]

## 2014-08-01 NOTE — Progress Notes (Signed)
Pt admitted to floor via stretcher.  Pt up w/o need of asst to BR- voiding w/o difficulty.  Pt deaf- interpreter at bedside.  Family outside of room. Pt alert and oriented and signing answers appropriately.

## 2014-08-02 ENCOUNTER — Other Ambulatory Visit: Payer: Self-pay | Admitting: Oncology

## 2014-08-02 DIAGNOSIS — D61810 Antineoplastic chemotherapy induced pancytopenia: Secondary | ICD-10-CM

## 2014-08-02 DIAGNOSIS — H9190 Unspecified hearing loss, unspecified ear: Secondary | ICD-10-CM

## 2014-08-02 DIAGNOSIS — T451X5A Adverse effect of antineoplastic and immunosuppressive drugs, initial encounter: Secondary | ICD-10-CM

## 2014-08-02 DIAGNOSIS — C50919 Malignant neoplasm of unspecified site of unspecified female breast: Secondary | ICD-10-CM

## 2014-08-02 DIAGNOSIS — D709 Neutropenia, unspecified: Secondary | ICD-10-CM

## 2014-08-02 DIAGNOSIS — Z853 Personal history of malignant neoplasm of breast: Secondary | ICD-10-CM

## 2014-08-02 DIAGNOSIS — D696 Thrombocytopenia, unspecified: Secondary | ICD-10-CM

## 2014-08-02 LAB — COMPREHENSIVE METABOLIC PANEL
ALK PHOS: 76 U/L (ref 39–117)
ALT: 41 U/L — ABNORMAL HIGH (ref 0–35)
ANION GAP: 10 (ref 5–15)
AST: 20 U/L (ref 0–37)
Albumin: 3.1 g/dL — ABNORMAL LOW (ref 3.5–5.2)
BUN: 7 mg/dL (ref 6–23)
CALCIUM: 8.7 mg/dL (ref 8.4–10.5)
CO2: 23 mEq/L (ref 19–32)
CREATININE: 0.68 mg/dL (ref 0.50–1.10)
Chloride: 109 mEq/L (ref 96–112)
GFR calc non Af Amer: >90 mL/min (ref >90)
GLUCOSE: 132 mg/dL — AB (ref 70–99)
Potassium: 3.9 mEq/L (ref 3.7–5.3)
Sodium: 142 mEq/L (ref 137–147)
TOTAL PROTEIN: 5.5 g/dL — AB (ref 6.0–8.3)
Total Bilirubin: 0.5 mg/dL (ref 0.3–1.2)

## 2014-08-02 LAB — C-REACTIVE PROTEIN: CRP: 2.1 mg/dL — ABNORMAL HIGH (ref <0.60)

## 2014-08-02 LAB — PROTIME-INR
INR: 1.07 (ref 0.00–1.49)
PROTHROMBIN TIME: 13.9 s (ref 11.6–15.2)

## 2014-08-02 LAB — CBC
HCT: 27.9 % — ABNORMAL LOW (ref 36.0–46.0)
HEMOGLOBIN: 9.8 g/dL — AB (ref 12.0–15.0)
MCH: 32.9 pg (ref 26.0–34.0)
MCHC: 35.1 g/dL (ref 30.0–36.0)
MCV: 93.6 fL (ref 78.0–100.0)
Platelets: 133 10*3/uL — ABNORMAL LOW (ref 150–400)
RBC: 2.98 MIL/uL — AB (ref 3.87–5.11)
RDW: 13.3 % (ref 11.5–15.5)
WBC: 3.1 10*3/uL — AB (ref 4.0–10.5)

## 2014-08-02 LAB — SEDIMENTATION RATE: SED RATE: 29 mm/h — AB (ref 0–22)

## 2014-08-02 MED ORDER — PIPERACILLIN-TAZOBACTAM 3.375 G IVPB
3.3750 g | Freq: Three times a day (TID) | INTRAVENOUS | Status: DC
  Administered 2014-08-02 – 2014-08-04 (×4): 3.375 g via INTRAVENOUS
  Filled 2014-08-02 (×8): qty 50

## 2014-08-02 MED ORDER — LIP MEDEX EX OINT
TOPICAL_OINTMENT | CUTANEOUS | Status: AC
  Filled 2014-08-02: qty 7

## 2014-08-02 NOTE — Progress Notes (Signed)
PROGRESS NOTE    Regina Watts GLO:[id number] DOB: 03-Feb-1961 DOA: 08/01/2014 PCP: Wenda Low, MD  HPI/Brief narrative 53 year old female patient, just hospitalized 9/20-9/21 for medication related mental status changes which resolved promptly, on adjuvant chemotherapy for second primary breast cancer, congenital deafness, HLD, presented to ED on 08/01/14 with complaints of redness and soreness in the area of the right-sided PAC. She denies fever or chills. Last chemotherapy via port 07/30/14. Admitted for cellulitis versus PAC site infection.   Assessment/Plan:  1. Cellulitis of chest wall around PAC site Vs PAC infection: Started empirically on IV vancomycin and Zosyn. Somewhat improved. As per discussion with Dr. Marko Plume, use of [facility] for chemotherapy is completed and hence appropriate to remove PAC especially with suspicion of infection. Surgery consulted by Dr. Marko Plume. Blood culture-pending 2. Pancytopenia: Secondary to recent cancer chemotherapy. No reported bleeding. Follow CBC in a.m. 3. Congenital deafness: Discussed with patient with the help of human interpreter Mr. Elta Guadeloupe. 4. Recent hospitalization for MS changes: Attributed to medications. Resolved in mental status currently at baseline. 5. Breast cancer: Management per oncology.   Code Status: Full Family Communication: None at bedside. Patient declined offer to discuss her care with family. Disposition Plan: Home when medically stable.   Consultants:  Medical oncology  General surgery  Procedures:  None  Antibiotics:  IV vancomycin 9/23 >  IV Zosyn 9/23 >   Subjective: Interview the patient through human interpreter. She states that pain and redness of right upper chest has slightly improved. Denies any other complaints.  Objective: Filed Vitals:   08/01/14 2130 08/01/14 2228 08/01/14 2238 08/02/14 0546  BP: 141/77  136/85 123/65  Pulse: 90  85 92  Temp:   98.4 F (36.9 C) 98.1 F (36.7 C)  TempSrc:    Oral Oral  Resp:   18 18  Height:  5\' 7"  (1.702 m)    Weight:  72.802 kg (160 lb 8 oz)    SpO2: 98%  97% 98%    Intake/Output Summary (Last 24 hours) at 08/02/14 1313 Last data filed at 08/02/14 1000  Gross per 24 hour  Intake 821.67 ml  Output   1950 ml  Net -1128.33 ml   Filed Weights   08/01/14 2228  Weight: 72.802 kg (160 lb 8 oz)   Patient examined with her female RN in room.  Exam:  General exam: Pleasant young female sitting up comfortably in bed. Respiratory system: Clear. No increased work of breathing. Mild patchy erythema without warmth or significant tenderness around right PAC site. No drainage. Findings seems to have receded slightly from line marking drawn an admission. Cardiovascular system: S1 & S2 heard, RRR. No JVD, murmurs, gallops, clicks or pedal edema. Gastrointestinal system: Abdomen is nondistended, soft and nontender. Normal bowel sounds heard. Central nervous system: Alert and oriented. No focal neurological deficits. Extremities: Symmetric 5 x 5 power.   Data Reviewed: Basic Metabolic Panel:  Recent Labs Lab 07/29/14 0258 07/30/14 0526 08/01/14 1954 08/02/14 0535  NA 140 143 140 142  K 4.5 3.7 3.5* 3.9  CL 102 109 102 109  CO2 22 24 25 23   GLUCOSE 143* 113* 100* 132*  BUN 19 7 11 7   CREATININE 0.93 0.65 0.70 0.68  CALCIUM 10.1 8.9 9.2 8.7   Liver Function Tests:  Recent Labs Lab 07/29/14 0258 07/30/14 0526 08/01/14 1954 08/02/14 0535  AST 42* 40* 30 20  ALT 68* 59* 58* 41*  ALKPHOS 106 78 98 76  BILITOT 1.2 0.6 0.4 0.5  PROT 7.9 5.8* 6.4 5.5*  ALBUMIN 4.7 3.4* 3.8 3.1*   No results found for this basename: LIPASE, AMYLASE,  in the last 168 hours  Recent Labs Lab 07/29/14 1513  AMMONIA 18   CBC:  Recent Labs Lab 07/29/14 0258 07/30/14 0526 08/01/14 1954 08/02/14 0535  WBC 8.1 3.4* 4.0 3.1*  NEUTROABS 7.1  --  3.0  --   HGB 14.7 10.6* 10.9* 9.8*  HCT 41.7 31.1* 30.9* 27.9*  MCV 91.9 95.7 92.8 93.6  PLT 168  125* 158 133*   Cardiac Enzymes: No results found for this basename: CKTOTAL, CKMB, CKMBINDEX, TROPONINI,  in the last 168 hours BNP (last 3 results) No results found for this basename: PROBNP,  in the last 8760 hours CBG: No results found for this basename: GLUCAP,  in the last 168 hours  No results found for this or any previous visit (from the past 240 hour(s)).       Studies: No results found.      Scheduled Meds: . enoxaparin (LOVENOX) injection  40 mg Subcutaneous QHS  . piperacillin-tazobactam (ZOSYN)  IV  3.375 g Intravenous Q8H  . sertraline  100 mg Oral Daily  . vancomycin  1,000 mg Intravenous Q12H   Continuous Infusions: . sodium chloride 50 mL/hr at 08/02/14 8250    Principal Problem:   Cellulitis of chest wall Active Problems:   Breast cancer of upper-inner quadrant of left female breast   Deafness congenital   Port-a-cath in place    Time spent: 29 minutes    Ky Rumple, MD, FACP, FHM. [facility] Pager [phone]  If 7PM-7AM, please contact night-coverage [email or web address] Password [email or web address] 08/02/2014, 1:13 PM    LOS: 1 day

## 2014-08-02 NOTE — Consult Note (Signed)
Reason for Consult:  Infected port a cath Referring Physician:  Dr. Nunzio Cory is an 53 y.o. female.  KKX:FGHW medical history of breast cancer with Port-A-Cath placement with ongoing chemotherapy, dyslipidemia, deafness.  The patient presented with complaints of right chest wall swelling and redness which has been present since last 2 days. She mentions she yesterday she noted the right chest area had a swelling which is painful to touch. Today when she woke up she's noticed that there was some redness around that and it was progressively worsening.  She had a Port-A-Cath placement on the same side in April 15 after her surgery for left breast. She is ongoing with chemotherapy on a weekly basis although her last chemotherapy was on earlier in September.  She denies any fever or chills with complain of some fatigue he did she denies any vomiting but complains of some nausea. No chest pain no shortness of breath no dizziness no lightheadedness no diarrhea no burning urination. No recent change in her medication. Last time Port-A-Cath was accessed on Monday. She was admitted by Medicine on 08/01/14 and was seen by Dr. Marko Plume and she has called and ask we remove the port a cath.    BREAST LUMPECTOMY WITH NEEDLE LOCALIZATION AND AXILLARY SENTINEL LYMPH NODE BX (Left)  INSERTION PORT-A-CATH  03/06/14, Dr Lucia Gaskins  She is afebrile, VSS, labs show pancytopenia.  WBC 3.1.   Past Medical History  Diagnosis Date  . Congenital deafness   . Hyperlipidemia   . Breast cancer JANUARY 2002    RIGHT BREAST T4 SENTINEL NODE NEGATIVE, HER2 3+, ER/PR NEGATIVE  . Breast cancer February 2015    Left Breast T1N0  . Hx of radiation therapy   . Hx antineoplastic chemo   . Wears glasses     to drive    Past Surgical History  Procedure Laterality Date  . Tonsillectomy    . Breast surgery  2002    lumpectomy on right side  . Cesarean section    . Breast lumpectomy with needle localization and  axillary sentinel lymph node bx Left 03/06/2014    Procedure: BREAST LUMPECTOMY WITH NEEDLE LOCALIZATION AND AXILLARY SENTINEL LYMPH NODE BIOPSY;  Surgeon: Shann Medal, MD;  Location: [facility];  Service: General;  Laterality: Left;  . Portacath placement Right 03/06/2014    Procedure: INSERTION PORT-A-CATH;  Surgeon: Shann Medal, MD;  Location: [place];  Service: General;  Laterality: Right;  subclavian    Family History  Problem Relation Age of Onset  . Heart disease Mother   . Hypertension Mother   . Hyperlipidemia Mother   . Deafness Mother   . Liver cancer Maternal Aunt   . Liver cancer Maternal Grandfather   . Deafness Father   . Prostate cancer Father 45  . Deafness Sister   . Deafness Paternal Uncle   . Breast cancer Cousin     maternal cousin dx over 47  . Deafness Maternal Aunt   . Leukemia Cousin 2    Social History:  reports that she quit smoking about 20 years ago. She has never used smokeless tobacco. She reports that she drinks alcohol. She reports that she does not use illicit drugs.  Allergies:  Allergies  Allergen Reactions  . Taxotere [Docetaxel] Itching and Rash    Medications:  Prior to Admission:  Prescriptions prior to admission  Medication Sig Dispense Refill  . Alum & Mag Hydroxide-Simeth (MAGIC MOUTHWASH W/LIDOCAINE) SOLN  Swish swallow as needed for mouth/throat soreness  473 mL  2  . dexamethasone (DECADRON) 4 MG tablet Take five tablets with food 12hrs before taxol chemotherapy  20 tablet  1  . HYDROcodone-acetaminophen (NORCO/VICODIN) 5-325 MG per tablet Take 1 tablet by mouth every 6 (six) hours as needed for moderate pain.      Marland Kitchen lidocaine-prilocaine (EMLA) cream Apply to PortaCath  1-2 hrs prior to access as directed.  30 g  1  . loratadine (CLARITIN) 10 MG tablet Take 10 mg by mouth daily as needed for allergies.      Marland Kitchen LORazepam (ATIVAN) 0.5 MG tablet Place 1-2 tabs under tongue or swallow every 6 hrs as  needed for nausea.  Take night of chemo whether or not any nausea.  Will make Drowsy  30 tablet  0  . ondansetron (ZOFRAN) 8 MG tablet Take by mouth 2 (two) times daily.      Marland Kitchen PRESCRIPTION MEDICATION Chemotherapy at [facility]      . pyridOXINE (VITAMIN B-6) 50 MG tablet Take 50 mg by mouth 2 (two) times daily.      . sertraline (ZOLOFT) 100 MG tablet Take 1 tablet (100 mg total) by mouth daily.  30 tablet  3   Scheduled: . enoxaparin (LOVENOX) injection  40 mg Subcutaneous QHS  . piperacillin-tazobactam (ZOSYN)  IV  3.375 g Intravenous Q8H  . sertraline  100 mg Oral Daily  . vancomycin  1,000 mg Intravenous Q12H   Continuous: . sodium chloride 50 mL/hr at 08/02/14 9628   ZMO:[id number], acetaminophen, HYDROcodone-acetaminophen, ondansetron (ZOFRAN) IV, ondansetron, ondansetron Anti-infectives   Start     Dose/Rate Route Frequency Ordered Stop   08/02/14 0800  vancomycin (VANCOCIN) IVPB 1000 mg/200 mL premix     1,000 mg 200 mL/hr over 60 Minutes Intravenous Every 12 hours 08/01/14 2340     08/02/14 0600  piperacillin-tazobactam (ZOSYN) IVPB 3.375 g     3.375 g 12.5 mL/hr over 240 Minutes Intravenous Every 8 hours 08/01/14 2340     08/01/14 2000  piperacillin-tazobactam (ZOSYN) IVPB 3.375 g     3.375 g 100 mL/hr over 30 Minutes Intravenous  Once 08/01/14 1948 08/01/14 2213   08/01/14 1945  vancomycin (VANCOCIN) IVPB 1000 mg/200 mL premix     1,000 mg 200 mL/hr over 60 Minutes Intravenous  Once 08/01/14 1943 08/01/14 2143   08/01/14 1945  piperacillin-tazobactam (ZOSYN) IVPB 3.375 g  Status:  Discontinued     3.375 g 12.5 mL/hr over 240 Minutes Intravenous  Once 08/01/14 1943 08/01/14 1947      Results for orders placed during the hospital encounter of 08/01/14 (from the past 48 hour(s))  URINALYSIS, ROUTINE W REFLEX MICROSCOPIC     Status: Abnormal   Collection Time    08/01/14  7:28 PM      Result Value Ref Range   Color, Urine YELLOW  YELLOW   APPearance CLOUDY (*) CLEAR    Specific Gravity, Urine 1.025  1.005 - 1.030   pH 6.0  5.0 - 8.0   Glucose, UA NEGATIVE  NEGATIVE mg/dL   Hgb urine dipstick TRACE (*) NEGATIVE   Bilirubin Urine NEGATIVE  NEGATIVE   Ketones, ur NEGATIVE  NEGATIVE mg/dL   Protein, ur NEGATIVE  NEGATIVE mg/dL   Urobilinogen, UA 1.0  0.0 - 1.0 mg/dL   Nitrite NEGATIVE  NEGATIVE   Leukocytes, UA MODERATE (*) NEGATIVE  URINE MICROSCOPIC-ADD ON     Status: Abnormal   Collection Time    08/01/14  7:28 PM      Result Value Ref Range   Squamous Epithelial / LPF RARE  RARE   WBC, UA 21-50  <3 WBC/hpf   RBC / HPF 0-2  <3 RBC/hpf   Bacteria, UA FEW (*) RARE  CBC WITH DIFFERENTIAL     Status: Abnormal   Collection Time    08/01/14  7:54 PM      Result Value Ref Range   WBC 4.0  4.0 - 10.5 K/uL   RBC 3.33 (*) 3.87 - 5.11 MIL/uL   Hemoglobin 10.9 (*) 12.0 - 15.0 g/dL   HCT 30.9 (*) 36.0 - 46.0 %   MCV 92.8  78.0 - 100.0 fL   MCH 32.7  26.0 - 34.0 pg   MCHC 35.3  30.0 - 36.0 g/dL   RDW 13.1  11.5 - 15.5 %   Platelets 158  150 - 400 K/uL   Neutrophils Relative % 76  43 - 77 %   Neutro Abs 3.0  1.7 - 7.7 K/uL   Lymphocytes Relative 15  12 - 46 %   Lymphs Abs 0.6 (*) 0.7 - 4.0 K/uL   Monocytes Relative 8  3 - 12 %   Monocytes Absolute 0.3  0.1 - 1.0 K/uL   Eosinophils Relative 1  0 - 5 %   Eosinophils Absolute 0.0  0.0 - 0.7 K/uL   Basophils Relative 0  0 - 1 %   Basophils Absolute 0.0  0.0 - 0.1 K/uL  COMPREHENSIVE METABOLIC PANEL     Status: Abnormal   Collection Time    08/01/14  7:54 PM      Result Value Ref Range   Sodium 140  137 - 147 mEq/L   Potassium 3.5 (*) 3.7 - 5.3 mEq/L   Chloride 102  96 - 112 mEq/L   CO2 25  19 - 32 mEq/L   Glucose, Bld 100 (*) 70 - 99 mg/dL   BUN 11  6 - 23 mg/dL   Creatinine, Ser 0.70  0.50 - 1.10 mg/dL   Calcium 9.2  8.4 - 10.5 mg/dL   Total Protein 6.4  6.0 - 8.3 g/dL   Albumin 3.8  3.5 - 5.2 g/dL   AST 30  0 - 37 U/L   ALT 58 (*) 0 - 35 U/L   Alkaline Phosphatase 98  39 - 117 U/L   Total  Bilirubin 0.4  0.3 - 1.2 mg/dL   GFR calc non Af Amer >90  >90 mL/min   GFR calc Af Amer >90  >90 mL/min   Comment: (NOTE)     The eGFR has been calculated using the CKD EPI equation.     This calculation has not been validated in all clinical situations.     eGFR's persistently <90 mL/min signify possible Chronic Kidney     Disease.   Anion gap 13  5 - 15  I-STAT CG4 LACTIC ACID, ED     Status: None   Collection Time    08/01/14  8:06 PM      Result Value Ref Range   Lactic Acid, Venous 0.63  0.5 - 2.2 mmol/L  COMPREHENSIVE METABOLIC PANEL     Status: Abnormal   Collection Time    08/02/14  5:35 AM      Result Value Ref Range   Sodium 142  137 - 147 mEq/L   Potassium 3.9  3.7 - 5.3 mEq/L   Chloride 109  96 - 112 mEq/L   CO2  23  19 - 32 mEq/L   Glucose, Bld 132 (*) 70 - 99 mg/dL   BUN 7  6 - 23 mg/dL   Creatinine, Ser 0.68  0.50 - 1.10 mg/dL   Calcium 8.7  8.4 - 10.5 mg/dL   Total Protein 5.5 (*) 6.0 - 8.3 g/dL   Albumin 3.1 (*) 3.5 - 5.2 g/dL   AST 20  0 - 37 U/L   ALT 41 (*) 0 - 35 U/L   Alkaline Phosphatase 76  39 - 117 U/L   Total Bilirubin 0.5  0.3 - 1.2 mg/dL   GFR calc non Af Amer >90  >90 mL/min   GFR calc Af Amer >90  >90 mL/min   Comment: (NOTE)     The eGFR has been calculated using the CKD EPI equation.     This calculation has not been validated in all clinical situations.     eGFR's persistently <90 mL/min signify possible Chronic Kidney     Disease.   Anion gap 10  5 - 15  CBC     Status: Abnormal   Collection Time    08/02/14  5:35 AM      Result Value Ref Range   WBC 3.1 (*) 4.0 - 10.5 K/uL   RBC 2.98 (*) 3.87 - 5.11 MIL/uL   Hemoglobin 9.8 (*) 12.0 - 15.0 g/dL   HCT 27.9 (*) 36.0 - 46.0 %   MCV 93.6  78.0 - 100.0 fL   MCH 32.9  26.0 - 34.0 pg   MCHC 35.1  30.0 - 36.0 g/dL   RDW 13.3  11.5 - 15.5 %   Platelets 133 (*) 150 - 400 K/uL  PROTIME-INR     Status: None   Collection Time    08/02/14  5:35 AM      Result Value Ref Range   Prothrombin  Time 13.9  11.6 - 15.2 seconds   INR 1.07  0.00 - 1.49  SEDIMENTATION RATE     Status: Abnormal   Collection Time    08/02/14  5:35 AM      Result Value Ref Range   Sed Rate 29 (*) 0 - 22 mm/hr  C-REACTIVE PROTEIN     Status: Abnormal   Collection Time    08/02/14  5:35 AM      Result Value Ref Range   CRP 2.1 (*) <0.60 mg/dL   Comment: Performed at [facility]    No results found.  Review of Systems  Constitutional: Negative.   HENT: Positive for hearing loss.   Eyes: Negative.   Skin:       Red and tender Right port a cath site.  All other systems reviewed and are negative.  Blood pressure 123/65, pulse 92, temperature 98.1 F (36.7 C), temperature source Oral, resp. rate 18, height _0  (1.702 m), weight 72.802 kg (160 lb 8 oz), SpO2 98.00%. Physical Exam  Constitutional: She is oriented to person, place, and time. She appears well-developed and well-nourished. No distress.  HENT:  Head: Normocephalic and atraumatic.  Nose: Nose normal.  Eyes: Pupils are equal, round, and reactive to light.  Neck: Normal range of motion. Neck supple.  Cardiovascular: Normal rate, regular rhythm and normal heart sounds.   Respiratory: Effort normal and breath sounds normal. No respiratory distress. She has no wheezes. She has no rales. She exhibits no tenderness.  Right port a cath red and tender.    GI: Soft. Bowel sounds are normal. She exhibits  no distension. There is no tenderness. There is no rebound.  Musculoskeletal: She exhibits no edema.  Neurological: She is alert and oriented to person, place, and time. No cranial nerve deficit.  Skin: Skin is warm. No rash noted. She is diaphoretic. No erythema. No pallor.  Psychiatric: She has a normal mood and affect. Her behavior is normal. Judgment and thought content normal.    Assessment/Plan: 1.  Infected Port a cath 2.  RIGHT BREAST T4 SENTINEL NODE NEGATIVE, HER2 3+, ER/PR NEGATIVE , Left Breast T1N0 , Hx of radiation  therapy, Hx antineoplastic chemo   3.  Congenital deafness 4.  Dyslipidemia  Plan:  She has eaten and is very stable, I will place on on schedule for surgery tomorrow for port a cath removal on the right.   Revere Maahs 08/02/2014, 11:19 AM

## 2014-08-02 NOTE — Progress Notes (Signed)
08/02/2014, 10:49 AM  Hospital day 2 Antibiotics: vancomycin, zosyn Chemotherapy: weekly taxol 07-23-14, #7 of 12 planned cycles.  Outpatient XFG:[facility] Cristy Hilts, Karrar Husein, John McComb, Roni Bread Lomax, L[employer]  PAC placed by Dr Lucia Gaskins 03-06-14  Appreciate hospitalist service letting me know of admission for apparent PAC infection, as patient is receiving adjuvant chemotherapy for second primary breast cancer (the recent breast cancer on left, previous breast cancer on right). Joci is seen now with mother at bedside; we have communicated by writing. Patient's husband is expected to come, and I have left my pager # to be contacted when he arrives. I have spoken with unit RN so that nursing is aware of history of right breast cancer also, to watch peripheral IV closely. I have asked Dr Lucia Gaskins or associate to see also, as may be best to remove the PAC.  Patient was just hospitalized 9-20 thru 07-30-14 with MS changes that seemed likely medication and stress related, resolved promptly. There was no indication of infection or problem with PAC during that hospitalization. Planned weekly chemotherapy was held 07-30-14 due to that problem. There has been no recurrence of the confusion/ visual hallucinations since DC home. Patient presented to ED 08-01-14 PM complaining of "a few days" of redness and soreness in area of right sided PAC. She denies fever or chills and otherwise reports feeling well since hospital DC. The soreness is slightly better since admission last pm, remains afebrile. Patient tells me that all blood draws yesterday and today have been in arms, that PAC has not been accessed (bandaid at site apparently from recent hospital DC "too sore to take it off")  Subjective: Slept some last PM. Slight improvement in soreness to touch of the area around [facility]. No SOB/ cough. No esophagitis. Appetite good. No abdominal discomfort. Peripheral IV RUE not uncomfortable.  Peripheral neuropathy distal fingers and toes related to taxol unchanged in short break off of weekly taxol.   ONCOLOGIC HISTORY   RIGHT BREAST CANCER: Right breast cancer diagnosed January 2002 when patient was age 53 and premenopausal, T2N0M0 (4cm grade 3 invasive ductal carcinoma, <25% high grade DCIS, S phase 8.6%), ER negative, PR negative, HER 2 positive. She had lumpectomy with 2 negative sentinel nodes, adjuvant chemotherapy by Dr Starr Sinclair with adriamycin cytoxan x 4 cycles from March 2002 thru May 2002 (total adriamycin dose 409 mg with weight 142, height 5'7", for total adriamycin 235 mg/m2), and local radiation (50.4 Gy with boost to 60.4) Gy Note she declined PAC and received the chemotherapy in 2002 by peripheral vein. I followed her beginning Sept 2003 until last previous visit 07-2011. She declined TEACH trial with lapatinib in 2007 and 2008. Last breast MRI previously was 08-2011.  Patient had infertility treatments at [facility] in 2005, daughter born 51-26-2006. Menses had been very infrequent after chemo until the infertility treatment, then stopped after that pregnancy.   LEFT BREAST CANCER:  Mammograms at Dr Sherran Needs office early 12-2013 had area questioned on left compared with last prior mammograms of 08-2012. Diagnostic left mammogram and [place] at [facility] on 01-01-14 showed a mass in left upper inner breast posteriorly 1.5 x 1.4 x 1.3 cm on [place], with no abnormal left axillary nodes identified. She had biopsy on 01-01-14 with pathology ([id number]) showing grade 1-2 invasive ductal carcinoma ER, PR and HER 2 negative with Ki 66%. She had breast MRI 01-15-2014 with solitary enhancing mass upper inner left breast 1.7 x 2.0 x 1.8 cm, right breast not  remarkable, no abnormal appearing lymph nodes. She had CT CAP 01-29-14 then MRI liver 02-05-14 with no evidence of metastatic disease. Genetics testing with Breast/Ovarian Cancer Gene Panel 01-2014 did not reveal a pathogenic mutation in these genes,  tho she did have 3 variants of uncertain significance (ATM, [id number]; ATM, c.8674G>A and PMS2, C.1309C>T). She had consultation with Dr Tammi Klippel prior to definitive surgery. She had lumpectomy and left axillary sentinel node biopsy by Dr Lucia Gaskins on 03-06-2014. Pathology [id number]) found 1.8 cm grade II invasive ductal carcinoma with one negative sentinel node and final margins negative. She had PAC placed by Dr Lucia Gaskins 03-06-14/ Chemotherapy began with 2 cycles of dose dense AC on 03-30-14 and 04-13-14, then one treatment of cytoxan taxotere on 0-09-23 complicated by severe rash and hand foot syndrome. She began weekly taxol on 05-21-14, planned x 12 weeks. Expect local radiation after chemotherapy.      Objective: Vital signs in last 24 hours: Blood pressure 123/65, pulse 92, temperature 98.1 F (36.7 C), temperature source Oral, resp. rate 18, height '5\' 7"'  (1.702 m), weight 160 lb 8 oz (72.802 kg), SpO2 98.00%. Sitting up in chair, alert, looks comfortable, communicates easily and appropriately by writing to me. Respirations not labored RA. Alopecia. PERRL, not icteric. Oral mucosa moist and clear. Lungs clear. Heart RRR no murmur. Area of erythema and tenderness still reaches to inked margins of the admission area of involvement including down onto breast, somewhat tender to touch, no rash. PAC is not accessed. Peripheral IV RUE infusing without difficulty at [facility], site ok, no swelling RUE. Abdomen soft, not tender, not distended. LE no edema, cords, tenderness. Moves all extremities easily. PSYCH: mental status seems entirely appropriate and at baseline. Neuro: decreased sensation light touch fingertips and toes bilaterally, otherwise nonfocal.  Intake/Output from previous day: 09/23 0701 - 09/24 0700 In: 781.7 [I.V.:781.7] Out: 1750 [Urine:1750] Intake/Output this shift:     Lab Results:  Recent Labs  08/01/14 1954 08/02/14 0535  WBC 4.0 3.1*  HGB 10.9* 9.8*  HCT 30.9* 27.9*  PLT  158 133*   ANC on admission 3.0  Note lower platelets and WBC now. Would follow daily CBC diff. I do not expect drop now related to last chemo 9-14   BMET  Recent Labs  08/01/14 1954 08/02/14 0535  NA 140 142  K 3.5* 3.9  CL 102 109  CO2 25 23  GLUCOSE 100* 132*  BUN 11 7  CREATININE 0.70 0.68  CALCIUM 9.2 8.7   Cultures all pending from admission   Studies/Results: No results found.  Discussion: I have told patient that she needs to stay in hospital for IV antibiotics and to watch what appears to be infection around [facility]. I have told her that we may need to remove PAC, which I think would be safest unless site rapidly improves, which it has not done so far. I am glad to discuss also with husband when he comes (RN may need to help with this conversation if it is by phone, as husband has limited hearing).  Discussed with surgery PA on unit as he was seeing patient after my visit.     Assessment/Plan: 1. Erythema and tenderness across right anterior chest wall around PAC: no fever or chills and WBC not elevated, but not improving at least with 2 doses vanc and zosyn given beginning 2038 on 08-01-14. She appears clinically stable.  2. Triple negative left breast cancer T1cN0 diagnosed 12-2013, treated with lumpectomy and sentinel node evaluation 03-06-14. Adjuvant  chemotherapy in process, with 5 more weekly taxol treatments planned (depending on neuropathy), then local radiation. 3.history T2N0M0 right breast cancer 11-2000 at age 27 treated with lumpectomy, 2 sentinel nodes, chemotherapy and local radiation. This was ER PR negative and HER 2 + (prior to herceptin use)posterior congenital deafness 4.congenital deafness. Communicates by reading/ writing and sign language. 5.brief hospitalization last week for MS changes, resolved promptly, seemed medication and stress related. This was very atypical for her. 6.genetic variants of unknown significance on genetic testing 01-2014 7.  Slightly lower platelets and WBC. I have ordered CBC diff for AM.  I will follow with you. Please page between my rounds if needed.   Nathaly Dawkins P Pager [phone] Office [phone]

## 2014-08-03 ENCOUNTER — Inpatient Hospital Stay (HOSPITAL_COMMUNITY): Payer: BC Managed Care – PPO | Admitting: Anesthesiology

## 2014-08-03 ENCOUNTER — Encounter (HOSPITAL_COMMUNITY)
Admission: EM | Disposition: A | Discharge status: Home / Self Care | Payer: Self-pay | Source: Home / Self Care | Attending: Internal Medicine

## 2014-08-03 ENCOUNTER — Encounter (HOSPITAL_COMMUNITY): Payer: BC Managed Care – PPO | Admitting: Anesthesiology

## 2014-08-03 ENCOUNTER — Inpatient Hospital Stay: Admit: 2014-08-03 | Payer: Self-pay | Admitting: Surgery

## 2014-08-03 HISTORY — PX: PORT-A-CATH REMOVAL: SHX5289

## 2014-08-03 LAB — CBC WITH DIFFERENTIAL/PLATELET
BASOS ABS: 0 10*3/uL (ref 0.0–0.1)
Basophils Relative: 1 % (ref 0–1)
EOS PCT: 1 % (ref 0–5)
Eosinophils Absolute: 0 10*3/uL (ref 0.0–0.7)
HCT: 31.4 % — ABNORMAL LOW (ref 36.0–46.0)
Hemoglobin: 10.7 g/dL — ABNORMAL LOW (ref 12.0–15.0)
LYMPHS PCT: 18 % (ref 12–46)
Lymphs Abs: 0.6 10*3/uL — ABNORMAL LOW (ref 0.7–4.0)
MCH: 32.2 pg (ref 26.0–34.0)
MCHC: 34.1 g/dL (ref 30.0–36.0)
MCV: 94.6 fL (ref 78.0–100.0)
Monocytes Absolute: 0.3 10*3/uL (ref 0.1–1.0)
Monocytes Relative: 10 % (ref 3–12)
Neutro Abs: 2.1 10*3/uL (ref 1.7–7.7)
Neutrophils Relative %: 70 % (ref 43–77)
PLATELETS: 155 10*3/uL (ref 150–400)
RBC: 3.32 MIL/uL — ABNORMAL LOW (ref 3.87–5.11)
RDW: 13.4 % (ref 11.5–15.5)
WBC: 3 10*3/uL — AB (ref 4.0–10.5)

## 2014-08-03 LAB — SURGICAL PCR SCREEN
MRSA, PCR: NEGATIVE
Staphylococcus aureus: POSITIVE — AB

## 2014-08-03 SURGERY — REMOVAL PORT-A-CATH
Anesthesia: Monitor Anesthesia Care | Site: Chest | Laterality: Right

## 2014-08-03 MED ORDER — BUPIVACAINE-EPINEPHRINE (PF) 0.25% -1:200000 IJ SOLN
INTRAMUSCULAR | Status: AC
  Filled 2014-08-03: qty 30

## 2014-08-03 MED ORDER — PROPOFOL INFUSION 10 MG/ML OPTIME
INTRAVENOUS | Status: DC | PRN
  Administered 2014-08-03: 60 ug/kg/min via INTRAVENOUS

## 2014-08-03 MED ORDER — LACTATED RINGERS IV SOLN
INTRAVENOUS | Status: DC | PRN
  Administered 2014-08-03: 07:00:00 via INTRAVENOUS

## 2014-08-03 MED ORDER — MIDAZOLAM HCL 5 MG/5ML IJ SOLN
INTRAMUSCULAR | Status: DC | PRN
  Administered 2014-08-03: 2 mg via INTRAVENOUS

## 2014-08-03 MED ORDER — MEPERIDINE HCL 50 MG/ML IJ SOLN: 6.2500 mg | INTRAMUSCULAR | Status: DC | PRN

## 2014-08-03 MED ORDER — VANCOMYCIN HCL IN DEXTROSE 1-5 GM/200ML-% IV SOLN
1000.0000 mg | Freq: Two times a day (BID) | INTRAVENOUS | Status: DC
  Administered 2014-08-03 – 2014-08-04 (×3): 1000 mg via INTRAVENOUS
  Filled 2014-08-03 (×4): qty 200

## 2014-08-03 MED ORDER — LIDOCAINE-EPINEPHRINE 1 %-1:100000 IJ SOLN
INTRAMUSCULAR | Status: AC
  Filled 2014-08-03: qty 1

## 2014-08-03 MED ORDER — LIDOCAINE-EPINEPHRINE 1 %-1:100000 IJ SOLN
INTRAMUSCULAR | Status: DC | PRN
  Administered 2014-08-03: 5 mL

## 2014-08-03 MED ORDER — FENTANYL CITRATE 0.05 MG/ML IJ SOLN
INTRAMUSCULAR | Status: AC
  Filled 2014-08-03: qty 2

## 2014-08-03 MED ORDER — LIDOCAINE HCL (CARDIAC) 20 MG/ML IV SOLN
INTRAVENOUS | Status: AC
  Filled 2014-08-03: qty 5

## 2014-08-03 MED ORDER — PROPOFOL 10 MG/ML IV BOLUS
INTRAVENOUS | Status: AC
  Filled 2014-08-03: qty 20

## 2014-08-03 MED ORDER — FENTANYL CITRATE 0.05 MG/ML IJ SOLN: 25.0000 ug | INTRAMUSCULAR | Status: DC | PRN

## 2014-08-03 MED ORDER — PROMETHAZINE HCL 25 MG/ML IJ SOLN: 6.2500 mg | INTRAMUSCULAR | Status: DC | PRN

## 2014-08-03 MED ORDER — MIDAZOLAM HCL 2 MG/2ML IJ SOLN
INTRAMUSCULAR | Status: AC
  Filled 2014-08-03: qty 2

## 2014-08-03 MED ORDER — FENTANYL CITRATE 0.05 MG/ML IJ SOLN
INTRAMUSCULAR | Status: DC | PRN
  Administered 2014-08-03 (×2): 50 ug via INTRAVENOUS

## 2014-08-03 MED ORDER — LACTATED RINGERS IV SOLN
INTRAVENOUS | Status: DC
Start: 2014-08-03 — End: 2014-08-03

## 2014-08-03 SURGICAL SUPPLY — 37 items
APL SKNCLS STERI-STRIP NONHPOA (GAUZE/BANDAGES/DRESSINGS)
BENZOIN TINCTURE PRP APPL 2/3 (GAUZE/BANDAGES/DRESSINGS) ×1 IMPLANT
BLADE HEX COATED 2.75 (ELECTRODE) ×3 IMPLANT
BLADE SURG 15 STRL LF DISP TIS (BLADE) ×1 IMPLANT
BLADE SURG 15 STRL SS (BLADE) ×3
CLOSURE WOUND 1/2 X4 (GAUZE/BANDAGES/DRESSINGS)
DECANTER SPIKE VIAL GLASS SM (MISCELLANEOUS) ×3 IMPLANT
DRAPE LAPAROTOMY TRNSV 102X78 (DRAPE) ×3 IMPLANT
DRSG TEGADERM 6X8 (GAUZE/BANDAGES/DRESSINGS) IMPLANT
ELECT REM PT RETURN 9FT ADLT (ELECTROSURGICAL) ×3
ELECTRODE REM PT RTRN 9FT ADLT (ELECTROSURGICAL) ×1 IMPLANT
GAUZE SPONGE 4X4 12PLY STRL (GAUZE/BANDAGES/DRESSINGS) ×2 IMPLANT
GAUZE SPONGE 4X4 16PLY XRAY LF (GAUZE/BANDAGES/DRESSINGS) ×3 IMPLANT
GLOVE BIOGEL PI IND STRL 7.0 (GLOVE) ×1 IMPLANT
GLOVE BIOGEL PI INDICATOR 7.0 (GLOVE) ×2
GLOVE ECLIPSE 8.0 STRL XLNG CF (GLOVE) ×3 IMPLANT
GLOVE INDICATOR 8.0 STRL GRN (GLOVE) ×6 IMPLANT
GOWN STRL REUS W/TWL LRG LVL3 (GOWN DISPOSABLE) ×3 IMPLANT
GOWN STRL REUS W/TWL XL LVL3 (GOWN DISPOSABLE) ×6 IMPLANT
KIT BASIN OR (CUSTOM PROCEDURE TRAY) ×3 IMPLANT
NDL HYPO 25X1 1.5 SAFETY (NEEDLE) IMPLANT
NEEDLE HYPO 22GX1.5 SAFETY (NEEDLE) IMPLANT
NEEDLE HYPO 25X1 1.5 SAFETY (NEEDLE) IMPLANT
NS IRRIG 1000ML POUR BTL (IV SOLUTION) ×3 IMPLANT
PACK BASIC VI WITH GOWN DISP (CUSTOM PROCEDURE TRAY) ×3 IMPLANT
PAD ABD 8X10 STRL (GAUZE/BANDAGES/DRESSINGS) ×2 IMPLANT
PENCIL BUTTON HOLSTER BLD 10FT (ELECTRODE) ×3 IMPLANT
STRIP CLOSURE SKIN 1/2X4 (GAUZE/BANDAGES/DRESSINGS) ×1 IMPLANT
SUT MNCRL AB 4-0 PS2 18 (SUTURE) ×3 IMPLANT
SUT VIC AB 2-0 SH 27 (SUTURE)
SUT VIC AB 2-0 SH 27X BRD (SUTURE) IMPLANT
SUT VIC AB 3-0 SH 27 (SUTURE) ×3
SUT VIC AB 3-0 SH 27XBRD (SUTURE) ×1 IMPLANT
SYR BULB IRRIGATION 50ML (SYRINGE) ×3 IMPLANT
SYR CONTROL 10ML LL (SYRINGE) ×3 IMPLANT
TAPE CLOTH SURG 4X10 WHT LF (GAUZE/BANDAGES/DRESSINGS) ×2 IMPLANT
TOWEL OR 17X26 10 PK STRL BLUE (TOWEL DISPOSABLE) ×3 IMPLANT

## 2014-08-03 NOTE — Anesthesia Postprocedure Evaluation (Signed)
  Anesthesia Post-op Note  Patient: Regina Watts  Procedure(s) Performed: Procedure(s) (LRB): REMOVAL PORT-A-CATH (Right)  Patient Location: PACU  Anesthesia Type: MAC  Level of Consciousness: awake and alert   Airway and Oxygen Therapy: Patient Spontanous Breathing  Post-op Pain: mild  Post-op Assessment: Post-op Vital signs reviewed, Patient's Cardiovascular Status Stable, Respiratory Function Stable, Patent Airway and No signs of Nausea or vomiting  Last Vitals:  Filed Vitals:   08/03/14 0925  BP: 129/76  Pulse: 66  Temp: 36.3 C  Resp: 16    Post-op Vital Signs: stable   Complications: No apparent anesthesia complications

## 2014-08-03 NOTE — Progress Notes (Addendum)
ANTIBIOTIC CONSULT NOTE - Follow-up  Pharmacy Consult for Zosyn and Vancomycin Indication: Infection at [facility] with cellulitis of chest wall  Allergies  Allergen Reactions  . Taxotere [Docetaxel] Itching and Rash    Patient Measurements: Height: '5\' 7"'  (170.2 cm) Weight: 160 lb 8 oz (72.802 kg) IBW/kg (Calculated) : 61.6   Vital Signs: Temp: 97.4 F (36.3 C) (09/25 0925) Temp src: Oral (09/25 0615) BP: 129/76 mmHg (09/25 0925) Pulse Rate: 66 (09/25 0925) Intake/Output from previous day: 09/24 0701 - 09/25 0700 In: 280 [P.O.:240; I.V.:40] Out: 1500 [Urine:1500] Intake/Output from this shift: Total I/O In: 250 [I.V.:250] Out: -   Labs:  Recent Labs  08/01/14 1954 08/02/14 0535 08/03/14 0453  WBC 4.0 3.1* 3.0*  HGB 10.9* 9.8* 10.7*  PLT 158 133* 155  CREATININE 0.70 0.68  --    Estimated Creatinine Clearance: 80.9 ml/min (by C-G formula based on Cr of 0.68). No results found for this basename: VANCOTROUGH, VANCOPEAK, VANCORANDOM, [place], GENTPEAK, GENTRANDOM, TOBRATROUGH, TOBRAPEAK, TOBRARND, AMIKACINPEAK, AMIKACINTROU, AMIKACIN,  in the last 72 hours   Microbiology: Recent Results (from the past 720 hour(s))  CULTURE, BLOOD (ROUTINE X 2)     Status: None   Collection Time    08/01/14  7:59 PM      Result Value Ref Range Status   Specimen Description BLOOD RIGHT ANTECUBITAL   Final   Special Requests     Final   Value: BOTTLES DRAWN AEROBIC AND ANAEROBIC 5CC IMMUNOCOMPROMIZED   Culture  Setup Time     Final   Value: 08/02/2014 03:37     Performed at [facility]   Culture     Final   Value:        BLOOD CULTURE RECEIVED NO GROWTH TO DATE CULTURE WILL BE HELD FOR 5 DAYS BEFORE ISSUING A FINAL NEGATIVE REPORT     Performed at [facility]   Report Status PENDING   Incomplete  CULTURE, BLOOD (ROUTINE X 2)     Status: None   Collection Time    08/01/14  8:09 PM      Result Value Ref Range Status   Specimen Description BLOOD BLOOD LEFT FOREARM    Final   Special Requests BOTTLES DRAWN AEROBIC AND ANAEROBIC 4ML   Final   Culture  Setup Time     Final   Value: 08/02/2014 03:32     Performed at [facility]   Culture     Final   Value:        BLOOD CULTURE RECEIVED NO GROWTH TO DATE CULTURE WILL BE HELD FOR 5 DAYS BEFORE ISSUING A FINAL NEGATIVE REPORT     Performed at [facility]   Report Status PENDING   Incomplete  SURGICAL PCR SCREEN     Status: Abnormal   Collection Time    08/03/14 12:30 AM      Result Value Ref Range Status   MRSA, PCR NEGATIVE  NEGATIVE Final   Staphylococcus aureus POSITIVE (*) NEGATIVE Final   Comment:            The Xpert SA Assay (FDA     approved for NASAL specimens     in patients over 51 years of age),     is one component of     a comprehensive surveillance     program.  Test performance has     been validated by [employer] for patients greater than 51 year old     than  or equal to 51 year old.     It is not intended     to diagnose infection nor to     guide or monitor treatment.    Medical History: Past Medical History  Diagnosis Date  . Congenital deafness   . Hyperlipidemia   . Breast cancer JANUARY 2002    RIGHT BREAST T4 SENTINEL NODE NEGATIVE, HER2 3+, ER/PR NEGATIVE  . Breast cancer February 2015    Left Breast T1N0  . Hx of radiation therapy   . Hx antineoplastic chemo   . Wears glasses     to drive    Medications:  Scheduled:  . enoxaparin (LOVENOX) injection  40 mg Subcutaneous QHS  . piperacillin-tazobactam (ZOSYN)  IV  3.375 g Intravenous 3 times per day  . sertraline  100 mg Oral Daily  . vancomycin  1,000 mg Intravenous Q12H   Infusions:    Assessment: 53 yoF with hx of breast Ca admitted with altered mental status.  Zosyn and Vancomycin per Rx for cellulitis of chest wall.   Significant Events: 9/25: PAC removed  9/23 >> Vancomycin  >> 9/23 >> Zosyn  >>    Tmax: 98.8 WBCs: 3, ANC 2.1 Renal: 0.68, CrCl 80.9 mL/min CG  Microbiology: 9/23  blood x 2: NGTD 9/25 wound: pending 9/25 anaerobic cx: pending 9/25 MRSA PCR negative, S. aureus positive  Goal of Therapy:  Vancomycin trough level 10-15 mcg/ml  Plan:   Continue Zosyn 3.375 Gm IV q8h EI  Continue Vancomycin 1g IV q12h.  Plan for vancomycin trough level tomorrow prior to 11 AM dose and adjust as indicated.  Continue to follow renal function, cultures, therapeutic drug levels, clinical course.  Lindell Spar, PharmD, BCPS Pager: [phone] 08/03/2014 51:56 AM

## 2014-08-03 NOTE — Progress Notes (Signed)
PROGRESS NOTE    Regina Watts:[id number] DOB: 06-26-1961 DOA: 08/01/2014 PCP: Wenda Low, MD  HPI/Brief narrative 53 year old female patient, just hospitalized 9/20-9/21 for medication related mental status changes which resolved promptly, on adjuvant chemotherapy for second primary breast cancer, congenital deafness, HLD, presented to ED on 08/01/14 with complaints of redness and soreness in the area of the right-sided PAC. She denies fever or chills. Last chemotherapy via port 07/30/14. Admitted for cellulitis versus PAC site infection.   Assessment/Plan:  1. Infected Port-A-Cath with surrounding chest wall cellulitis: Started empirically on IV vancomycin and Zosyn. As per discussion with Dr. Marko Plume, use of [facility] for chemotherapy is completed and hence appropriate to remove PAC especially with suspicion of infection. S/P PAC removal on 9/25-there was purulent fluid surrounding the Port-A-Cath which was sent for culture-followup. Blood cultures-negative to date. Improving. Continue IV antibiotics for additional 24 hours and if clinically improving and cultures negative, consider DC home on oral antibiotics. Has close followup with Dr. Marko Plume on 9/28. 2. Pancytopenia: Secondary to recent cancer chemotherapy. No reported bleeding. Thrombocytopenia has resolved. Hemoglobin and WBC count stable. 3. Congenital deafness: Discussed with patient with the help of human interpreter, spouse and sister at bedside. 4. Recent hospitalization for MS changes: Attributed to medications. Resolved & mental status currently at baseline. 5. Breast cancer: Management per oncology.   Code Status: Full Family Communication: Discussed with patients spouse and sister at bedside Disposition Plan: Home when medically stable-possibly 9/26.   Consultants:  Medical oncology  General surgery  Procedures:  Removal of infected Port-A-Cath 9/25  Antibiotics:  IV vancomycin 9/23 >  IV Zosyn 9/23 >    Subjective: Interview the patient through human interpreter. States that right upper chest area pain has decreased.  Objective: Filed Vitals:   08/03/14 0900 08/03/14 0925 08/03/14 1125 08/03/14 1225  BP: 115/83 129/76 96/55 91/55   Pulse:  66 77 79  Temp: 97.4 F (36.3 C) 97.4 F (36.3 C) 97.8 F (36.6 C) 97.8 F (36.6 C)  TempSrc:   Oral Oral  Resp: 19 16 16 16   Height:      Weight:      SpO2:  100% 97% 98%    Intake/Output Summary (Last 24 hours) at 08/03/14 1358 Last data filed at 08/03/14 1002  Gross per 24 hour  Intake    490 ml  Output   1300 ml  Net   -810 ml   Filed Weights   08/01/14 2228  Weight: 72.802 kg (160 lb 8 oz)   Patient examined with her female RN in room.  Exam:  General exam: Pleasant young female sitting up comfortably in bed. Respiratory system: Clear. No increased work of breathing. Dressing over previous PAC site-clean and dry (just returned from OR) Cardiovascular system: S1 & S2 heard, RRR. No JVD, murmurs, gallops, clicks or pedal edema. Gastrointestinal system: Abdomen is nondistended, soft and nontender. Normal bowel sounds heard. Central nervous system: Alert and oriented. No focal neurological deficits. Extremities: Symmetric 5 x 5 power.   Data Reviewed: Basic Metabolic Panel:  Recent Labs Lab 07/29/14 0258 07/30/14 0526 08/01/14 1954 08/02/14 0535  NA 140 143 140 142  K 4.5 3.7 3.5* 3.9  CL 102 109 102 109  CO2 22 24 25 23   GLUCOSE 143* 113* 100* 132*  BUN 19 7 11 7   CREATININE 0.93 0.65 0.70 0.68  CALCIUM 10.1 8.9 9.2 8.7   Liver Function Tests:  Recent Labs Lab 07/29/14 0258 07/30/14 0526 08/01/14 1954 08/02/14 0535  AST 42* 40* 30 20  ALT 68* 59* 58* 41*  ALKPHOS 106 78 98 76  BILITOT 1.2 0.6 0.4 0.5  PROT 7.9 5.8* 6.4 5.5*  ALBUMIN 4.7 3.4* 3.8 3.1*   No results found for this basename: LIPASE, AMYLASE,  in the last 168 hours  Recent Labs Lab 07/29/14 1513  AMMONIA 18   CBC:  Recent  Labs Lab 07/29/14 0258 07/30/14 0526 08/01/14 1954 08/02/14 0535 08/03/14 0453  WBC 8.1 3.4* 4.0 3.1* 3.0*  NEUTROABS 7.1  --  3.0  --  2.1  HGB 14.7 10.6* 10.9* 9.8* 10.7*  HCT 41.7 31.1* 30.9* 27.9* 31.4*  MCV 91.9 95.7 92.8 93.6 94.6  PLT 168 125* 158 133* 155   Cardiac Enzymes: No results found for this basename: CKTOTAL, CKMB, CKMBINDEX, TROPONINI,  in the last 168 hours BNP (last 3 results) No results found for this basename: PROBNP,  in the last 8760 hours CBG: No results found for this basename: GLUCAP,  in the last 168 hours  Recent Results (from the past 240 hour(s))  CULTURE, BLOOD (ROUTINE X 2)     Status: None   Collection Time    08/01/14  7:59 PM      Result Value Ref Range Status   Specimen Description BLOOD RIGHT ANTECUBITAL   Final   Special Requests     Final   Value: BOTTLES DRAWN AEROBIC AND ANAEROBIC 5CC IMMUNOCOMPROMIZED   Culture  Setup Time     Final   Value: 08/02/2014 03:37     Performed at [facility]   Culture     Final   Value:        BLOOD CULTURE RECEIVED NO GROWTH TO DATE CULTURE WILL BE HELD FOR 5 DAYS BEFORE ISSUING A FINAL NEGATIVE REPORT     Performed at [facility]   Report Status PENDING   Incomplete  CULTURE, BLOOD (ROUTINE X 2)     Status: None   Collection Time    08/01/14  8:09 PM      Result Value Ref Range Status   Specimen Description BLOOD BLOOD LEFT FOREARM   Final   Special Requests BOTTLES DRAWN AEROBIC AND ANAEROBIC 4ML   Final   Culture  Setup Time     Final   Value: 08/02/2014 03:32     Performed at [facility]   Culture     Final   Value:        BLOOD CULTURE RECEIVED NO GROWTH TO DATE CULTURE WILL BE HELD FOR 5 DAYS BEFORE ISSUING A FINAL NEGATIVE REPORT     Performed at [facility]   Report Status PENDING   Incomplete  SURGICAL PCR SCREEN     Status: Abnormal   Collection Time    08/03/14 12:30 AM      Result Value Ref Range Status   MRSA, PCR NEGATIVE  NEGATIVE Final    Staphylococcus aureus POSITIVE (*) NEGATIVE Final   Comment:            The Xpert SA Assay (FDA     approved for NASAL specimens     in patients over 3 years of age),     is one component of     a comprehensive surveillance     program.  Test performance has     been validated by [employer] for patients greater     than or equal to 37 year old.  It is not intended     to diagnose infection nor to     guide or monitor treatment.  ANAEROBIC CULTURE     Status: None   Collection Time    08/03/14  8:12 AM      Result Value Ref Range Status   Specimen Description WOUND INFECTED PORT A CATH   Final   Special Requests NONE   Final   Gram Stain     Final   Value: RARE WBC PRESENT,BOTH PMN AND MONONUCLEAR     NO SQUAMOUS EPITHELIAL CELLS SEEN     NO ORGANISMS SEEN     Performed at [facility]   Culture PENDING   Incomplete   Report Status PENDING   Incomplete  WOUND CULTURE     Status: None   Collection Time    08/03/14  8:12 AM      Result Value Ref Range Status   Specimen Description WOUND INFECTED PORT A CATH   Final   Special Requests NONE   Final   Gram Stain     Final   Value: RARE WBC PRESENT,BOTH PMN AND MONONUCLEAR     NO SQUAMOUS EPITHELIAL CELLS SEEN     NO ORGANISMS SEEN     Performed at [facility]   Culture PENDING   Incomplete   Report Status PENDING   Incomplete         Studies: No results found.      Scheduled Meds: . enoxaparin (LOVENOX) injection  40 mg Subcutaneous QHS  . piperacillin-tazobactam (ZOSYN)  IV  3.375 g Intravenous 3 times per day  . sertraline  100 mg Oral Daily  . vancomycin  1,000 mg Intravenous Q12H   Continuous Infusions:    Principal Problem:   Cellulitis of chest wall Active Problems:   Breast cancer of upper-inner quadrant of left female breast   Deafness congenital   Port-a-cath in place    Time spent: 50 minutes    Cassandr Cederberg, MD, FACP, FHM. [facility] Pager  [phone]  If 7PM-7AM, please contact night-coverage [email or web address] Password [email or web address] 08/03/2014, 1:58 PM    LOS: 2 days

## 2014-08-03 NOTE — Progress Notes (Signed)
Medical Oncology  Patient still in post op now from [facility] removal this AM.  EMR information including operative note reviewed, purulent fluid around PAC at time of removal.  Has not been febrile overnight. CBC this AM with ANC 2.1 and plt up to 155k.  Blood cultures from admission negative to date. I have spoken with husband at length yesterday in room and now on unit.  Patient's mother was admitted to [facility] last PM with respiratory problems. Position of present IV makes it difficult for [place] to do sign language - may be able to lock it between antibiotic doses?  Evlyn Clines, MD

## 2014-08-03 NOTE — Anesthesia Preprocedure Evaluation (Addendum)
Anesthesia Evaluation  Patient identified by MRN, date of birth, ID band Patient awake    Reviewed: Allergy & Precautions, H&P , NPO status , Patient's Chart, lab work & pertinent test results  Airway Mallampati: III TM Distance: >3 FB Neck ROM: full    Dental no notable dental hx.    Pulmonary neg pulmonary ROS, former smoker,  breath sounds clear to auscultation  Pulmonary exam normal       Cardiovascular negative cardio ROS  Rhythm:Regular Rate:Normal     Neuro/Psych negative neurological ROS  negative psych ROS   GI/Hepatic negative GI ROS, Neg liver ROS,   Endo/Other  negative endocrine ROS  Renal/GU negative Renal ROS  negative genitourinary   Musculoskeletal negative musculoskeletal ROS (+)   Abdominal   Peds negative pediatric ROS (+)  Hematology negative hematology ROS (+)   Anesthesia Other Findings   Reproductive/Obstetrics negative OB ROS                          Anesthesia Physical  Anesthesia Plan  ASA: II  Anesthesia Plan: MAC   Post-op Pain Management:    Induction: Intravenous  Airway Management Planned: Simple Face Mask  Additional Equipment:   Intra-op Plan:   Post-operative Plan:   Informed Consent: I have reviewed the patients History and Physical, chart, labs and discussed the procedure including the risks, benefits and alternatives for the proposed anesthesia with the patient or authorized representative who has indicated his/her understanding and acceptance.   Dental advisory given  Plan Discussed with: CRNA, Anesthesiologist and Surgeon  Anesthesia Plan Comments:         Anesthesia Quick Evaluation

## 2014-08-03 NOTE — Progress Notes (Signed)
Spoke with patient and husband regarding switching patient's IV over to the right since it was stated that patient was being treated on the right side, and patient and spouse treatment was currently on the left side, will monitor patient closely Neta Mends RN 08-03-2014 17:09pm

## 2014-08-03 NOTE — Progress Notes (Signed)
08/03/2014, 9:37 AM  Hospital day 3 Antibiotics: vancomycin, zosyn Chemotherapy: last weekly taxol was 07-23-14, #7 of planned 12 cycles Portacath removed this AM  Discussed with Dr Algis Liming by phone yesterday. See my note from earlier this AM re separate discussions with husband.  Patient seen now, just back from OR after PAC removal. Sign language interpreter, husband, sister Rose and unit RN all at bedside.  Ahmaya denies chills or pain, does itch at surgical site, which is dressed. She tells [place] that peripheral IV in right upper forearm is uncomfortable when she uses hand/arm to sign. She slept maybe 1-2 hrs at most last pm.  I have explained to RN need to watch peripheral IV sites carefully due to bilateral breast ca, bilateral sentinel lymph node evaluations and radiation on right. Would probably be best if she does not need maintenance IVF to NSL the peripheral IV and use that way for IV antibiotics.     Patient's mother admitted to [facility] last PM for respiratory problems.  Subjective: No complaints otherwise, wants to eat and has just ordered breakfast. No SOB, cough or other pain.   Oncologic History RIGHT BREAST CANCER: Right breast cancer diagnosed January 2002 when patient was age 53 and premenopausal, T2N0M0 (4cm grade 3 invasive ductal carcinoma, <25% high grade DCIS, S phase 8.6%), ER negative, PR negative, HER 2 positive. She had lumpectomy with 2 negative sentinel nodes, adjuvant chemotherapy by Dr Starr Sinclair with adriamycin cytoxan x 4 cycles from March 2002 thru May 2002 (total adriamycin dose 409 mg with weight 142, height 5'7", for total adriamycin 235 mg/m2), and local radiation (50.4 Gy with boost to 60.4) Gy Note she declined PAC and received the chemotherapy in 2002 by peripheral vein. I followed her beginning Sept 2003 until last previous visit 07-2011. She declined TEACH trial with lapatinib in 2007 and 2008. Last breast MRI previously was 08-2011.  Patient had  infertility treatments at [facility] in 2005, daughter born 02-01-2005. Menses had been very infrequent after chemo until the infertility treatment, then stopped after that pregnancy.  LEFT BREAST CANCER: Mammograms at Dr Sherran Needs office early 12-2013 had area questioned on left compared with last prior mammograms of 08-2012. Diagnostic left mammogram and [place] at [facility] on 01-01-14 showed a mass in left upper inner breast posteriorly 1.5 x 1.4 x 1.3 cm on [place], with no abnormal left axillary nodes identified. She had biopsy on 01-01-14 with pathology ([id number]) showing grade 1-2 invasive ductal carcinoma ER, PR and HER 2 negative with Ki 66%. She had breast MRI 01-15-2014 with solitary enhancing mass upper inner left breast 1.7 x 2.0 x 1.8 cm, right breast not remarkable, no abnormal appearing lymph nodes. She had CT CAP 01-29-14 then MRI liver 02-05-14 with no evidence of metastatic disease. Genetics testing with Breast/Ovarian Cancer Gene Panel 01-2014 did not reveal a pathogenic mutation in these genes, tho she did have 3 variants of uncertain significance (ATM, [id number]; ATM, c.8674G>A and PMS2, C.1309C>T). She had consultation with Dr Tammi Klippel prior to definitive surgery. She had lumpectomy and left axillary sentinel node biopsy by Dr Lucia Gaskins on 03-06-2014. Pathology [id number]) found 1.8 cm grade II invasive ductal carcinoma with one negative sentinel node and final margins negative. She had PAC placed by Dr Lucia Gaskins 03-06-14/  Chemotherapy began with 2 cycles of dose dense AC on 03-30-14 and 04-13-14, then one treatment of cytoxan taxotere on 03-12-87 complicated by severe rash and hand foot syndrome. She began weekly taxol on 05-21-14, planned x 12 weeks.  Expect local radiation after chemotherapy.    Objective: Vital signs in last 24 hours: Blood pressure 129/76, pulse 66, temperature 97.4 F (36.3 C), temperature source Oral, resp. rate 16, height 5' 7" (1.702 m), weight 160 lb 8 oz (72.802 kg), SpO2  100.00%. Awake, alert, interacts easily, looks somewhat tired but otherwise stable. PERRL. Alopecia. Site of previous PAC with dressings dry and intact. Peripheral IV right forearm site with minimal erythema, insertion not tender, no swelling of arm.  Intake/Output from previous day: 09/24 0701 - 09/25 0700 In: 280 [P.O.:240; I.V.:40] Out: 1500 [Urine:1500] Intake/Output this shift: Total I/O In: 250 [I.V.:250] Out: -     Lab Results:  Recent Labs  08/02/14 0535 08/03/14 0453  WBC 3.1* 3.0*  HGB 9.8* 10.7*  HCT 27.9* 31.4*  PLT 133* 155   BMET  Recent Labs  08/01/14 1954 08/02/14 0535  NA 140 142  K 3.5* 3.9  CL 102 109  CO2 25 23  GLUCOSE 100* 132*  BUN 11 7  CREATININE 0.70 0.68  CALCIUM 9.2 8.7   Blood cultures pending from admission, no growth as yet.  Surgical cultures sent and pending.  Studies/Results: No results found.   Assessment/Plan: 1. Infection at [facility] with cellulitis: PAC removed this AM, continuing antibiotics awaiting culture results. Appreciate assistance from Dr Zella Richer. 2. Triple negative left breast cancer T1cN0 diagnosed 12-2013, treated with lumpectomy and sentinel node evaluation 03-06-14. Adjuvant chemotherapy in process, with 5 more weekly taxol treatments planned (depending on neuropathy), then local radiation.  3.history T2N0M0 right breast cancer 11-2000 at age 89 treated with lumpectomy, 2 sentinel nodes, chemotherapy and local radiation. This was ER PR negative and HER 2 + (prior to herceptin use)posterior congenital deafness  4.congenital deafness. Communicates by reading/ writing and sign language.  5.brief hospitalization last week for MS changes, resolved promptly, seemed medication and stress related. This was very atypical for her. She had not slept in several days prior to that problem. 6.genetic variants of unknown significance on genetic testing 01-2014  7.  platelets better on CBC today, and not neutropenic 8.NOTE  peripheral IV access needs to be watched closely due to bilateral breast cancer interventions, but fortunately only sentinel nodes bilaterally. OK to use LUE for IV if needed (has not had RT on left yet). Suggest NSL if does not need maintenance IVF. I expect we can carefully use peripheral veins to complete weekly taxol.  9.patient's mother also hospitalized last PM   Please call between my rounds if needed  Gordy Levan MD Pager [phone] Office [phone]

## 2014-08-03 NOTE — Transfer of Care (Signed)
Immediate Anesthesia Transfer of Care Note  Patient: Regina Watts  Procedure(s) Performed: Procedure(s): REMOVAL PORT-A-CATH (Right)  Patient Location: PACU  Anesthesia Type:MAC  Level of Consciousness: sedated  Airway & Oxygen Therapy: Patient Spontanous Breathing and Patient connected to face mask oxygen  Post-op Assessment: Report given to PACU RN and Post -op Vital signs reviewed and stable  Post vital signs: Reviewed and stable  Complications: No apparent anesthesia complications

## 2014-08-03 NOTE — Progress Notes (Addendum)
I have seen and examined Regina Watts.  She has an infected right chest wall Port-a-cath with cellulitis around the site and extending inferiorly.  Plan on Port-a-cath removal and will leave wound open to heal by secondary intention.  The procedure and risks (including but not limited to bleeding and wound healing problem) were discussed with her via an interpreter.

## 2014-08-03 NOTE — Op Note (Signed)
Operative Note  Regina Watts female 53 y.o. 08/03/2014  PREOPERATIVE DX:  Infected Port-a-cath  POSTOPERATIVE DX:  Same  PROCEDURE:  Removal of infected Port-a-cath         Surgeon: Odis Hollingshead   Assistants: none  Anesthesia: Monitored Local Anesthesia (1% Xylocaine) with Sedation  Indications:  [name] is a 53 year old female with breast cancer who has been receiving chemotherapy through a right chest wall Port-A-Cath. The Port-A-Cath has become infected and she presents for urgent removal.    Procedure Detail:  She was brought to the operating room placed supine on the operating table and intravenous sedation was given. The right upper chest wall was sterilely prepped and draped. Local anesthetic was infiltrated at the previous right chest wall Port-A-Cath site incision. The skin and subcutaneous tissue were divided sharply. Bleeding from the subcutaneous tissue was controlled electrocautery. The catheter was exposed and pulled out of the right subclavian vein. Direct pressure was held over the right subclavian vein site for approximately 5-10 minutes. Using electrocautery I dissected the Port-A-Cath free from the chest wall. There was purulent fluid surrounding the Port-A-Cath and this was sent for culture.   Hemostasis was adequate.  Subsequently, the chest wall was packed with saline moistened gauze followed by bulky dry dressing.  She tolerated the procedure well without any apparent complications and was taken to the recovery room in satisfactory condition.  Estimated Blood Loss:  less than 50 mL         Specimens: fluid sent for culture        Complications:  * No complications entered in OR log *         Disposition: PACU - hemodynamically stable.         Condition: stable

## 2014-08-04 DIAGNOSIS — Z9889 Other specified postprocedural states: Secondary | ICD-10-CM

## 2014-08-04 LAB — VANCOMYCIN, TROUGH: VANCOMYCIN TR: 13.2 ug/mL (ref 10.0–20.0)

## 2014-08-04 MED ORDER — CLINDAMYCIN HCL 300 MG PO CAPS: 300.0000 mg | ORAL_CAPSULE | Freq: Three times a day (TID) | ORAL | Status: DC

## 2014-08-04 NOTE — Progress Notes (Signed)
1 Day Post-Op  Subjective: No complaints.  Objective: Vital signs in last 24 hours: Temp:  [97.5 F (36.4 C)-98.3 F (36.8 C)] 98 F (36.7 C) (09/26 0550) Pulse Rate:  [67-98] 67 (09/26 0550) Resp:  [16-18] 18 (09/26 0550) BP: (76-114)/(55-78) 114/78 mmHg (09/26 0550) SpO2:  [97 %-98 %] 97 % (09/26 0550) Last BM Date: 08/01/14  Intake/Output from previous day: 09/25 0701 - 09/26 0700 In: 1530 [P.O.:1080; I.V.:250; IV Piggyback:200] Out: 1650 [Urine:1650] Intake/Output this shift:    PE: General- In NAD Right chest wall wound is clean with decreased right breast erythema  Lab Results:   Recent Labs  08/02/14 0535 08/03/14 0453  WBC 3.1* 3.0*  HGB 9.8* 10.7*  HCT 27.9* 31.4*  PLT 133* 155   BMET  Recent Labs  08/01/14 1954 08/02/14 0535  NA 140 142  K 3.5* 3.9  CL 102 109  CO2 25 23  GLUCOSE 100* 132*  BUN 11 7  CREATININE 0.70 0.68  CALCIUM 9.2 8.7   PT/INR  Recent Labs  08/02/14 0535  LABPROT 13.9  INR 1.07   Comprehensive Metabolic Panel:    Component Value Date/Time   NA 142 08/02/2014 0535   NA 140 08/01/2014 1954   NA 139 07/23/2014 1027   NA 140 07/17/2014 0939   K 3.9 08/02/2014 0535   K 3.5* 08/01/2014 1954   K 4.8 07/23/2014 1027   K 4.5 07/17/2014 0939   CL 109 08/02/2014 0535   CL 102 08/01/2014 1954   CO2 23 08/02/2014 0535   CO2 25 08/01/2014 1954   CO2 21* 07/23/2014 1027   CO2 23 07/17/2014 0939   BUN 7 08/02/2014 0535   BUN 11 08/01/2014 1954   BUN 11.7 07/23/2014 1027   BUN 13.8 07/17/2014 0939   CREATININE 0.68 08/02/2014 0535   CREATININE 0.70 08/01/2014 1954   CREATININE 0.8 07/23/2014 1027   CREATININE 0.8 07/17/2014 0939   GLUCOSE 132* 08/02/2014 0535   GLUCOSE 100* 08/01/2014 1954   GLUCOSE 194* 07/23/2014 1027   GLUCOSE 171* 07/17/2014 0939   CALCIUM 8.7 08/02/2014 0535   CALCIUM 9.2 08/01/2014 1954   CALCIUM 9.7 07/23/2014 1027   CALCIUM 9.7 07/17/2014 0939   AST 20 08/02/2014 0535   AST 30 08/01/2014 1954   AST 30 07/23/2014 1027   AST  32 07/17/2014 0939   ALT 41* 08/02/2014 0535   ALT 58* 08/01/2014 1954   ALT 53 07/23/2014 1027   ALT 57* 07/17/2014 0939   ALKPHOS 76 08/02/2014 0535   ALKPHOS 98 08/01/2014 1954   ALKPHOS 101 07/23/2014 1027   ALKPHOS 91 07/17/2014 0939   BILITOT 0.5 08/02/2014 0535   BILITOT 0.4 08/01/2014 1954   BILITOT 0.71 07/23/2014 1027   BILITOT 0.69 07/17/2014 0939   PROT 5.5* 08/02/2014 0535   PROT 6.4 08/01/2014 1954   PROT 7.3 07/23/2014 1027   PROT 6.9 07/17/2014 0939   ALBUMIN 3.1* 08/02/2014 0535   ALBUMIN 3.8 08/01/2014 1954   ALBUMIN 4.2 07/23/2014 1027   ALBUMIN 3.9 07/17/2014 0939     Studies/Results: No results found.  Anti-infectives: Anti-infectives   Start     Dose/Rate Route Frequency Ordered Stop   08/03/14 1100  vancomycin (VANCOCIN) IVPB 1000 mg/200 mL premix     1,000 mg 200 mL/hr over 60 Minutes Intravenous Every 12 hours 08/03/14 1048     08/02/14 2200  piperacillin-tazobactam (ZOSYN) IVPB 3.375 g     3.375 g 12.5 mL/hr over 240 Minutes Intravenous  3 times per day 08/02/14 2044     08/02/14 0800  vancomycin (VANCOCIN) IVPB 1000 mg/200 mL premix  Status:  Discontinued     1,000 mg 200 mL/hr over 60 Minutes Intravenous Every 12 hours 08/01/14 2340 08/03/14 1048   08/02/14 0600  piperacillin-tazobactam (ZOSYN) IVPB 3.375 g  Status:  Discontinued     3.375 g 12.5 mL/hr over 240 Minutes Intravenous Every 8 hours 08/01/14 2340 08/02/14 2041   08/01/14 2000  piperacillin-tazobactam (ZOSYN) IVPB 3.375 g     3.375 g 100 mL/hr over 30 Minutes Intravenous  Once 08/01/14 1948 08/01/14 2213   08/01/14 1945  vancomycin (VANCOCIN) IVPB 1000 mg/200 mL premix     1,000 mg 200 mL/hr over 60 Minutes Intravenous  Once 08/01/14 1943 08/01/14 2143   08/01/14 1945  piperacillin-tazobactam (ZOSYN) IVPB 3.375 g  Status:  Discontinued     3.375 g 12.5 mL/hr over 240 Minutes Intravenous  Once 08/01/14 1943 08/01/14 1947      Assessment Infected port-a-cath s/p removal:  Cellulitis significantly  improved.  Wound is clean.  Wound culture pending.   LOS: 3 days   Plan: Continue saline damp to dry dressing changes BID which she will need to do at home.  Follow up with Dr. Lucia Gaskins in the office in 2 weeks.  Please call to make the appointment.   Siddhanth Denk Lenna Sciara 08/04/2014

## 2014-08-04 NOTE — Discharge Summary (Signed)
Physician Discharge Summary  Regina Watts UXL:[id number] DOB: [birth year] DOA: 08/01/2014  PCP: Wenda Low, MD  Admit date: 08/01/2014 Discharge date: 08/04/2014  Time spent: 35 minutes  Recommendations for Outpatient Follow-up:  1. Please follow up on Chest Wall cellulitis, she was discharged on Clindamycin.    Discharge Diagnoses:  Principal Problem:   Cellulitis of chest wall Active Problems:   Breast cancer of upper-inner quadrant of left female breast   Deafness congenital   Port-a-cath in place   Discharge Condition: Stable/Improved  Diet recommendation: Regular  Filed Weights   08/01/14 2228  Weight: 72.802 kg (160 lb 8 oz)    History of present illness:  Regina Watts is a 53 y.o. female with Past medical history of breast cancer with Port-A-Cath placement with ongoing chemotherapy, dyslipidemia, deafness.  The patient presented with complaints of right chest wall swelling and redness which has been present since last 2 days. She mentions she yesterday she noted the right chest area had a swelling which is painful to touch. Today when she woke up she's noticed that there was some redness around that and it was progressively worsening.  She had a Port-A-Cath placement on the same side in April 15 after her surgery for left breast. She is ongoing with chemotherapy on a weekly basis although her last chemotherapy was on earlier in September.  She denies any fever or chills with complain of some fatigue he did she denies any vomiting but complains of some nausea. No chest pain no shortness of breath no dizziness no lightheadedness no diarrhea no burning urination. No recent change in her medication. Last time Port-A-Cath was accessed on Monday.  The patient is coming from home. And at her baseline independent for most of her ADL   Hospital Course:  Patient is a pleasant 53 year old female with a history of breast cancer status post Port-A-Cath placement undergoing  chemotherapy at the [facility] who was admitted to the medicine service on 08/01/2014 presented with complaints of right chest wall swelling erythema and pain that started 2 days prior to admission. She was started on empiric broad-spectrum IV antibiotic therapy with vancomycin and Zosyn. Symptoms attributed to an infected Port-A-Cath. During this hospitalization she was seen and evaluated by Dr. Marko Plume of oncology and Dr. Zella Richer of general surgery. On 08/03/2014 she underwent removal of infected Port-A-Cath, or seizure performed by Dr. Zella Richer. She tolerated procedure well there are no immediate complications. On 08/04/2014 she was found to have significant improvement of erythema, with surgery recommending saline damp to dry dressing changes twice a day which she would continue to do at home. She was discharged to home in stable condition on 08/04/2014.  Procedures:  Removal of infected Port-A-Cath on 08/03/2014  Consultations:  Medical oncology. Dr. Marko Plume  General surgery. Dr. Zella Richer  Discharge Exam: Filed Vitals:   08/04/14 1000  BP: 97/56  Pulse: 72  Temp: 98.2 F (36.8 C)  Resp: 16    General: Patient is in no acute distress, doing well, anxious to go home today Cardiovascular: Regular rate and rhythm normal S1-S2 Respiratory: Clear to auscultation bilaterally Skin: Chest walls a lot is improved, no evidence of purulence, decreased erythema noted  Discharge Instructions You were cared for by a hospitalist during your hospital stay. If you have any questions about your discharge medications or the care you received while you were in the hospital after you are discharged, you can call the unit and asked to speak with the hospitalist on call if  the hospitalist that took care of you is not available. Once you are discharged, your primary care physician will handle any further medical issues. Please note that NO REFILLS for any discharge medications will be authorized  once you are discharged, as it is imperative that you return to your primary care physician (or establish a relationship with a primary care physician if you do not have one) for your aftercare needs so that they can reassess your need for medications and monitor your lab values.  Discharge Instructions   Call MD for:  difficulty breathing, headache or visual disturbances    Complete by:  As directed      Call MD for:  extreme fatigue    Complete by:  As directed      Call MD for:  hives    Complete by:  As directed      Call MD for:  persistant dizziness or light-headedness    Complete by:  As directed      Call MD for:  persistant nausea and vomiting    Complete by:  As directed      Call MD for:  redness, tenderness, or signs of infection (pain, swelling, redness, odor or green/yellow discharge around incision site)    Complete by:  As directed      Call MD for:  severe uncontrolled pain    Complete by:  As directed      Call MD for:  temperature >100.4    Complete by:  As directed      Diet - low sodium heart healthy    Complete by:  As directed      Increase activity slowly    Complete by:  As directed           Current Discharge Medication List    START taking these medications   Details  clindamycin (CLEOCIN) 300 MG capsule Take 1 capsule (300 mg total) by mouth 3 (three) times daily. Qty: 12 capsule, Refills: 0      CONTINUE these medications which have NOT CHANGED   Details  Alum & Mag Hydroxide-Simeth (MAGIC MOUTHWASH W/LIDOCAINE) SOLN Swish swallow as needed for mouth/throat soreness Qty: 473 mL, Refills: 2    dexamethasone (DECADRON) 4 MG tablet Take five tablets with food 12hrs before taxol chemotherapy Qty: 20 tablet, Refills: 1   Associated Diagnoses: Breast cancer of upper-inner quadrant of left female breast    HYDROcodone-acetaminophen (NORCO/VICODIN) 5-325 MG per tablet Take 1 tablet by mouth every 6 (six) hours as needed for moderate pain.     lidocaine-prilocaine (EMLA) cream Apply to PortaCath  1-2 hrs prior to access as directed. Qty: 30 g, Refills: 1   Associated Diagnoses: Breast cancer of upper-inner quadrant of left female breast    loratadine (CLARITIN) 10 MG tablet Take 10 mg by mouth daily as needed for allergies.    LORazepam (ATIVAN) 0.5 MG tablet Place 1-2 tabs under tongue or swallow every 6 hrs as needed for nausea.  Take night of chemo whether or not any nausea.  Will make Drowsy Qty: 30 tablet, Refills: 0   Associated Diagnoses: Breast cancer of upper-inner quadrant of left female breast    ondansetron (ZOFRAN) 8 MG tablet Take by mouth 2 (two) times daily.    PRESCRIPTION MEDICATION Chemotherapy at [facility]    pyridOXINE (VITAMIN B-6) 50 MG tablet Take 50 mg by mouth 2 (two) times daily.    sertraline (ZOLOFT) 100 MG tablet Take 1 tablet (100 mg total) by  mouth daily. Qty: 30 tablet, Refills: 3   Associated Diagnoses: Breast cancer of upper-inner quadrant of left female breast       Allergies  Allergen Reactions  . Taxotere [Docetaxel] Itching and Rash   Follow-up Information   Follow up with Wenda Low, MD In 1 week.   Specialty:  Internal Medicine   Contact information:   [address], [address] [place] [address] [phone]       Follow up with [facility] H, MD In 10 days.   Specialty:  General Surgery   Contact information:   [address] [place] [address] [phone]        The results of significant diagnostics from this hospitalization (including imaging, microbiology, ancillary and laboratory) are listed below for reference.    Significant Diagnostic Studies: Ct Head Wo Contrast  07/29/2014   CLINICAL DATA:  Altered mental status, known breast cancer.  EXAM: CT HEAD WITHOUT CONTRAST  TECHNIQUE: Contiguous axial images were obtained from the base of the skull through the vertex without intravenous contrast.  COMPARISON:  None.  FINDINGS: Patient  was unable to remain in proper positioning, resulting in a extremely tilted image acquisition.  Mildly motion degraded examination.  The ventricles and sulci are normal. No intraparenchymal hemorrhage, mass effect nor midline shift. No acute large vascular territory infarcts.  No abnormal extra-axial fluid collections. Basal cisterns are patent.  No skull fracture. The included ocular globes and orbital contents are non-suspicious. The mastoid aircells and included paranasal sinuses are well-aerated.  IMPRESSION: Suboptimal image acquisition without convincing evidence of acute intracranial process.   Electronically Signed   By: Elon Alas   On: 07/29/2014 03:55    Microbiology: Recent Results (from the past 240 hour(s))  CULTURE, BLOOD (ROUTINE X 2)     Status: None   Collection Time    08/01/14  7:59 PM      Result Value Ref Range Status   Specimen Description BLOOD RIGHT ANTECUBITAL   Final   Special Requests     Final   Value: BOTTLES DRAWN AEROBIC AND ANAEROBIC 5CC IMMUNOCOMPROMIZED   Culture  Setup Time     Final   Value: 08/02/2014 03:37     Performed at [facility]   Culture     Final   Value:        BLOOD CULTURE RECEIVED NO GROWTH TO DATE CULTURE WILL BE HELD FOR 5 DAYS BEFORE ISSUING A FINAL NEGATIVE REPORT     Performed at [facility]   Report Status PENDING   Incomplete  CULTURE, BLOOD (ROUTINE X 2)     Status: None   Collection Time    08/01/14  8:09 PM      Result Value Ref Range Status   Specimen Description BLOOD BLOOD LEFT FOREARM   Final   Special Requests BOTTLES DRAWN AEROBIC AND ANAEROBIC 4ML   Final   Culture  Setup Time     Final   Value: 08/02/2014 03:32     Performed at [facility]   Culture     Final   Value:        BLOOD CULTURE RECEIVED NO GROWTH TO DATE CULTURE WILL BE HELD FOR 5 DAYS BEFORE ISSUING A FINAL NEGATIVE REPORT     Performed at [facility]   Report Status PENDING   Incomplete  SURGICAL PCR SCREEN      Status: Abnormal   Collection Time    08/03/14  12:30 AM      Result Value Ref Range Status   MRSA, PCR NEGATIVE  NEGATIVE Final   Staphylococcus aureus POSITIVE (*) NEGATIVE Final   Comment:            The Xpert SA Assay (FDA     approved for NASAL specimens     in patients over 80 years of age),     is one component of     a comprehensive surveillance     program.  Test performance has     been validated by [employer] for patients greater     than or equal to 59 year old.     It is not intended     to diagnose infection nor to     guide or monitor treatment.  ANAEROBIC CULTURE     Status: None   Collection Time    08/03/14  8:12 AM      Result Value Ref Range Status   Specimen Description WOUND INFECTED PORT A CATH   Final   Special Requests NONE   Final   Gram Stain     Final   Value: RARE WBC PRESENT,BOTH PMN AND MONONUCLEAR     NO SQUAMOUS EPITHELIAL CELLS SEEN     NO ORGANISMS SEEN     Performed at [facility]   Culture     Final   Value: NO ANAEROBES ISOLATED; CULTURE IN PROGRESS FOR 5 DAYS     Performed at [facility]   Report Status PENDING   Incomplete  WOUND CULTURE     Status: None   Collection Time    08/03/14  8:12 AM      Result Value Ref Range Status   Specimen Description WOUND INFECTED PORT A CATH   Final   Special Requests NONE   Final   Gram Stain     Final   Value: RARE WBC PRESENT,BOTH PMN AND MONONUCLEAR     NO SQUAMOUS EPITHELIAL CELLS SEEN     NO ORGANISMS SEEN     Performed at [facility]   Culture     Final   Value: NO GROWTH 1 DAY     Performed at [facility]   Report Status PENDING   Incomplete     Labs: Basic Metabolic Panel:  Recent Labs Lab 07/29/14 0258 07/30/14 0526 08/01/14 1954 08/02/14 0535  NA 140 143 140 142  K 4.5 3.7 3.5* 3.9  CL 102 109 102 109  CO2 22 24 25 23   GLUCOSE 143* 113* 100* 132*  BUN 19 7 11 7   CREATININE 0.93 0.65 0.70 0.68  CALCIUM 10.1 8.9 9.2 8.7    Liver Function Tests:  Recent Labs Lab 07/29/14 0258 07/30/14 0526 08/01/14 1954 08/02/14 0535  AST 42* 40* 30 20  ALT 68* 59* 58* 41*  ALKPHOS 106 78 98 76  BILITOT 1.2 0.6 0.4 0.5  PROT 7.9 5.8* 6.4 5.5*  ALBUMIN 4.7 3.4* 3.8 3.1*   No results found for this basename: LIPASE, AMYLASE,  in the last 168 hours  Recent Labs Lab 07/29/14 1513  AMMONIA 18   CBC:  Recent Labs Lab 07/29/14 0258 07/30/14 0526 08/01/14 1954 08/02/14 0535 08/03/14 0453  WBC 8.1 3.4* 4.0 3.1* 3.0*  NEUTROABS 7.1  --  3.0  --  2.1  HGB 14.7 10.6* 10.9* 9.8* 10.7*  HCT 41.7 31.1* 30.9* 27.9* 31.4*  MCV 91.9 95.7 92.8 93.6 94.6  PLT 168 125* 158 133* 155   Cardiac Enzymes: No results found for this basename: CKTOTAL, CKMB, CKMBINDEX, TROPONINI,  in the last 168 hours BNP: BNP (last 3 results) No results found for this basename: PROBNP,  in the last 8760 hours CBG: No results found for this basename: GLUCAP,  in the last 168 hours     Signed:  Kelvin Cellar  [facility] 08/04/2014, 12:13 PM

## 2014-08-04 NOTE — Discharge Summary (Signed)
Reviewed discharge instructions with pt and spouse including follow-up appointments, medications, s/s of infection, diet, and activity level.  Instructed pt and spouse in R chest dressing change with both participating in the process.  Provided dressing change supplies and provided typed step-by-step instructions.  Answered questions from spouse and pt and spouse verbalized understanding (pt nodded understanding and signed, "thank you."  Note:  Offered interpreter services, but pt/spouse declined.  Said they would request, if did not understand instructions (but then did not require).  Answered questions in writing, when additional clarification needed to ensure sufficient understanding.  Pt being d/c via w/c into care of spouse.

## 2014-08-05 LAB — WOUND CULTURE: Culture: NO GROWTH

## 2014-08-06 ENCOUNTER — Other Ambulatory Visit: Payer: BC Managed Care – PPO

## 2014-08-06 ENCOUNTER — Telehealth: Payer: Self-pay | Admitting: Oncology

## 2014-08-06 ENCOUNTER — Ambulatory Visit: Payer: BC Managed Care – PPO | Admitting: Adult Health

## 2014-08-06 ENCOUNTER — Other Ambulatory Visit (HOSPITAL_BASED_OUTPATIENT_CLINIC_OR_DEPARTMENT_OTHER): Payer: BC Managed Care – PPO

## 2014-08-06 ENCOUNTER — Encounter: Payer: Self-pay | Admitting: Oncology

## 2014-08-06 ENCOUNTER — Telehealth: Payer: Self-pay

## 2014-08-06 ENCOUNTER — Ambulatory Visit (HOSPITAL_BASED_OUTPATIENT_CLINIC_OR_DEPARTMENT_OTHER): Payer: BC Managed Care – PPO | Admitting: Oncology

## 2014-08-06 ENCOUNTER — Ambulatory Visit: Payer: BC Managed Care – PPO

## 2014-08-06 VITALS — BP 114/66 | HR 66 | Temp 98.1°F | Resp 20 | Ht 67.0 in | Wt 159.7 lb

## 2014-08-06 DIAGNOSIS — Z23 Encounter for immunization: Secondary | ICD-10-CM | POA: Diagnosis not present

## 2014-08-06 DIAGNOSIS — C50212 Malignant neoplasm of upper-inner quadrant of left female breast: Secondary | ICD-10-CM

## 2014-08-06 DIAGNOSIS — Z171 Estrogen receptor negative status [ER-]: Secondary | ICD-10-CM

## 2014-08-06 DIAGNOSIS — G609 Hereditary and idiopathic neuropathy, unspecified: Secondary | ICD-10-CM

## 2014-08-06 DIAGNOSIS — C50219 Malignant neoplasm of upper-inner quadrant of unspecified female breast: Secondary | ICD-10-CM

## 2014-08-06 DIAGNOSIS — Z853 Personal history of malignant neoplasm of breast: Secondary | ICD-10-CM

## 2014-08-06 LAB — COMPREHENSIVE METABOLIC PANEL (CC13)
ALBUMIN: 3.7 g/dL (ref 3.5–5.0)
ALT: 44 U/L (ref 0–55)
ANION GAP: 9 meq/L (ref 3–11)
AST: 28 U/L (ref 5–34)
Alkaline Phosphatase: 85 U/L (ref 40–150)
BUN: 14.5 mg/dL (ref 7.0–26.0)
CALCIUM: 10 mg/dL (ref 8.4–10.4)
CHLORIDE: 108 meq/L (ref 98–109)
CO2: 24 mEq/L (ref 22–29)
Creatinine: 0.8 mg/dL (ref 0.6–1.1)
GLUCOSE: 120 mg/dL (ref 70–140)
Potassium: 4.3 mEq/L (ref 3.5–5.1)
Sodium: 141 mEq/L (ref 136–145)
Total Bilirubin: 0.4 mg/dL (ref 0.20–1.20)
Total Protein: 6.6 g/dL (ref 6.4–8.3)

## 2014-08-06 LAB — CBC WITH DIFFERENTIAL/PLATELET
BASO%: 1.2 % (ref 0.0–2.0)
BASOS ABS: 0 10*3/uL (ref 0.0–0.1)
EOS%: 2.2 % (ref 0.0–7.0)
Eosinophils Absolute: 0.1 10*3/uL (ref 0.0–0.5)
HEMATOCRIT: 35.3 % (ref 34.8–46.6)
HGB: 11.8 g/dL (ref 11.6–15.9)
LYMPH#: 0.5 10*3/uL — AB (ref 0.9–3.3)
LYMPH%: 18.4 % (ref 14.0–49.7)
MCH: 32.1 pg (ref 25.1–34.0)
MCHC: 33.6 g/dL (ref 31.5–36.0)
MCV: 95.7 fL (ref 79.5–101.0)
MONO#: 0.3 10*3/uL (ref 0.1–0.9)
MONO%: 10.7 % (ref 0.0–14.0)
NEUT#: 2 10*3/uL (ref 1.5–6.5)
NEUT%: 67.5 % (ref 38.4–76.8)
Platelets: 218 10*3/uL (ref 145–400)
RBC: 3.69 10*6/uL — ABNORMAL LOW (ref 3.70–5.45)
RDW: 14.1 % (ref 11.2–14.5)
WBC: 2.9 10*3/uL — ABNORMAL LOW (ref 3.9–10.3)

## 2014-08-06 MED ORDER — INFLUENZA VAC SPLIT QUAD 0.5 ML IM SUSY
0.5000 mL | PREFILLED_SYRINGE | Freq: Once | INTRAMUSCULAR | Status: AC
  Administered 2014-08-06: 0.5 mL via INTRAMUSCULAR
  Filled 2014-08-06: qty 0.5

## 2014-08-06 NOTE — Telephone Encounter (Signed)
Set up [facility] nurse to go out to the patient's home to teach her the Dressing change with packing to wound from [facility] removal. Faxed completed Documentation of Face to Face Encounter to Ms. Hayworth and office notes and demographics to [facility]  Sent a copy to HIM to be scanned into patient's EMR. The Nurse needs to have a sign language interpreter with her as Ms. Mulkern is deaf. Husband works in [place] and is leaving tomorrow to go back to work.

## 2014-08-06 NOTE — Progress Notes (Signed)
OFFICE PROGRESS NOTE   08/06/2014   Physicians:David Cristy Hilts, Karrar Husein, Arvella Nigh, Roni Bread Lomax   INTERVAL HISTORY:  Patient is seen, together with husband and sign language interpreter, in follow up of most recent hospitalization and as we continue adjuvant treatment for second primary breast cancer. Most recent chemotherapy was #7 weekly taxol on 07-23-14. She was admitted 9-20 thru 07-30-14 with mental status change likely related to medication, then admitted 9-23 thru 08-04-14 with new PAC infection. The PAC was removed by Dr Zella Richer on 08-03-14. Cultures are negative to date, she was covered with vanc and zosyn in hospital and is completing additional 4 days of clindamycin now.   Regina Watts was afebrile in hospital and has had no fever or chills since DC home on 08-04-14. Husband is doing bid wet to dry dressing changes to the open wound, which Regina Watts does not feel comfortable doing herself and apparently no one else able to assist, tho family local and a college student living in the home. Husband needs to return to his job in [place] in next 24 hours. I have spoken with [facility] x2, then multiple conversations with [place] to get [facility] set up for wound evaluation, care and teaching ASAP. Tiphany denies pain at surgical site and has had no bleeding or significant drainage. She slept last pm, has had no MS changes, appetite good, no problems in UE where she had peripheral IVs and blood draws in hospital (bilateral breast cancers).  No central catheter since PAC removed 08-03-14. Flu vaccine given today. BRCA negative tho 3 variants of uncertain significance (01-2014)    ONCOLOGIC HISTORY RIGHT BREAST CANCER: Right breast cancer diagnosed January 2002 when patient was age 53 and premenopausal, T2N0M0 (4cm grade 3 invasive ductal carcinoma, <25% high grade DCIS, S phase 8.6%), ER negative, PR negative, HER 2 positive. She had lumpectomy with 2  negative sentinel nodes, adjuvant chemotherapy by Dr Starr Sinclair with adriamycin cytoxan x 4 cycles from March 2002 thru May 2002 (total adriamycin dose 409 mg with weight 142, height 5'7", for total adriamycin 235 mg/m2), and local radiation (50.4 Gy with boost to 60.4) Gy Note she declined PAC and received the chemotherapy in 2002 by peripheral vein. I followed her beginning Sept 2003 until last previous visit 07-2011. She declined TEACH trial with lapatinib in 2007 and 2008. Last breast MRI previously was 08-2011.  Patient had infertility treatments at [facility] in 2005, daughter born 02-01-2005. Menses had been very infrequent after chemo until the infertility treatment, then stopped after that pregnancy.  LEFT BREAST CANCER: Mammograms at Dr Sherran Needs office early 12-2013 had area questioned on left compared with last prior mammograms of 08-2012. Diagnostic left mammogram and [place] at [facility] on 01-01-14 showed a mass in left upper inner breast posteriorly 1.5 x 1.4 x 1.3 cm on [place], with no abnormal left axillary nodes identified. She had biopsy on 01-01-14 with pathology ([id number]) showing grade 1-2 invasive ductal carcinoma ER, PR and HER 2 negative with Ki 66%. She had breast MRI 01-15-2014 with solitary enhancing mass upper inner left breast 1.7 x 2.0 x 1.8 cm, right breast not remarkable, no abnormal appearing lymph nodes. She had CT CAP 01-29-14 then MRI liver 02-05-14 with no evidence of metastatic disease. Genetics testing with Breast/Ovarian Cancer Gene Panel 01-2014 did not reveal a pathogenic mutation in these genes, tho she did have 3 variants of uncertain significance (ATM, [id number]; ATM, c.8674G>A and PMS2, C.1309C>T). She had consultation  with Dr Tammi Klippel prior to definitive surgery. She had lumpectomy and left axillary sentinel node biopsy by Dr Lucia Gaskins on 03-06-2014. Pathology [phone]) found 1.8 cm grade II invasive ductal carcinoma with one negative sentinel node and final margins negative.  She had PAC placed by Dr Lucia Gaskins 03-06-14/  Chemotherapy began with 2 cycles of dose dense AC on 03-30-14 and 04-13-14, then one treatment of cytoxan taxotere on 3-82-50 complicated by severe rash and hand foot syndrome. She began weekly taxol on 05-21-14, planned x 12 weeks. Expect local radiation after chemotherapy.  Review of systems as above, also: No SOB. No LE swelling. No bleeding. Bowels ok. Hair is growing. Peripheral neuropathy fingers and toes no better since last chemo, difficulty opening food packages and opening jar lids. Remainder of 10 point Review of Systems negative.  Objective:  Vital signs in last 24 hours:  BP 114/66  Pulse 66  Temp(Src) 98.1 F (36.7 C) (Oral)  Resp 20  Ht _0  (1.702 m)  Wt 159 lb 11.2 oz (72.439 kg)  BMI 25.01 kg/m2 weight down 6 lbs.  Alert, oriented and appropriate. Ambulatory without difficulty. Looks comfortable,  Alopecia  HEENT:PERRL, sclerae not icteric. Oral mucosa moist without lesions, posterior pharynx clear.  Neck supple. No JVD.  Lymphatics:no cervical,supraclavicular adenopathy Resp: clear to auscultation bilaterally and normal percussion bilaterally Cardio: regular rate and rhythm. No gallop. GI: soft, nontender, not distended, no mass or organomegaly. Normally active bowel sounds.  Musculoskeletal/ Extremities: without pitting edema, cords, tenderness Neuro: sensory peripheral neuropathy fingertips. Otherwise nonfocal. PSYCH normal mood and affect. Skin without rash, ecchymosis, petechiae Breasts: exam not repeated today Portacath site with dry dressing, packing in place, clean granulation tissue visible at edges, no surrounding erythema or tenderness.  Lab Results:  Results for orders placed in visit on 08/06/14  CBC WITH DIFFERENTIAL      Result Value Ref Range   WBC 2.9 (*) 3.9 - 10.3 10e3/uL   NEUT# 2.0  1.5 - 6.5 10e3/uL   HGB 11.8  11.6 - 15.9 g/dL   HCT 35.3  34.8 - 46.6 %   Platelets 218  145 - 400 10e3/uL   MCV  95.7  79.5 - 101.0 fL   MCH 32.1  25.1 - 34.0 pg   MCHC 33.6  31.5 - 36.0 g/dL   RBC 3.69 (*) 3.70 - 5.45 10e6/uL   RDW 14.1  11.2 - 14.5 %   lymph# 0.5 (*) 0.9 - 3.3 10e3/uL   MONO# 0.3  0.1 - 0.9 10e3/uL   Eosinophils Absolute 0.1  0.0 - 0.5 10e3/uL   Basophils Absolute 0.0  0.0 - 0.1 10e3/uL   NEUT% 67.5  38.4 - 76.8 %   LYMPH% 18.4  14.0 - 49.7 %   MONO% 10.7  0.0 - 14.0 %   EOS% 2.2  0.0 - 7.0 %   BASO% 1.2  0.0 - 2.0 %  COMPREHENSIVE METABOLIC PANEL (NL97)      Result Value Ref Range   Sodium 141  136 - 145 mEq/L   Potassium 4.3  3.5 - 5.1 mEq/L   Chloride 108  98 - 109 mEq/L   CO2 24  22 - 29 mEq/L   Glucose 120  70 - 140 mg/dl   BUN 14.5  7.0 - 26.0 mg/dL   Creatinine 0.8  0.6 - 1.1 mg/dL   Total Bilirubin 0.40  0.20 - 1.20 mg/dL   Alkaline Phosphatase 85  40 - 150 U/L   AST 28  5 -  34 U/L   ALT 44  0 - 55 U/L   Total Protein 6.6  6.4 - 8.3 g/dL   Albumin 3.7  3.5 - 5.0 g/dL   Calcium 10.0  8.4 - 10.4 mg/dL   Anion Gap 9  3 - 11 mEq/L    Cultures 9-23 and 9-25 negative to date.  Studies/Results:  No results found.   Medications: I have reviewed the patient's current medications. Complete clindamycin  DISCUSSION: Wound care concerns as above. Patient, interpreter and husband waited at office until we were able to confirm with [place] able to see her within 24 hours, all information as required completed and faxed to [facility], husband will stay and assist until 08-07-14. Husband is concerned about amount of leave he has available with FMLA, and will follow up with HR. Appointment set up with Dr Lucia Gaskins for 08-09-14, this earlier than the 2 week appointment in DC summary due to his schedule and patient's concerns about wound care. Will not give chemo today as originally planned. I will see her in 1 week and hope to give chemo that day if stable. Patient and husband understand that, from standpoint of breast cancer, it seems best to try to give as much of the planned  chemo as possible, tho obviously may not be able to do all of that depending on her peripheral neuropathy.   Assessment/Plan: 1. Infection at [facility] with cellulitis: PAC removed 08-03-14, completing antibiotics, cultures negative to date. Wound left open, bid wet to dry dressing changes. Needs home health assistance for wound care due to knowledge deficit related to deafness and immune compromised status related to chemotherapy. Sign language interpreter needed with [facility] visit. 2. Triple negative left breast cancer T1cN0 diagnosed 12-2013, treated with lumpectomy and sentinel node evaluation 03-06-14. Adjuvant chemotherapy in process, with 5 more weekly taxol treatments planned (depending on neuropathy), then local radiation. Weeks 4 and 7 delayed due to low counts, and week 8 delayed due to hospitalizations/ PAC infection. Will try to give week 7 on 08-13-14. 3.history T2N0M0 right breast cancer 11-2000 at age 21 treated with lumpectomy, 2 sentinel nodes, chemotherapy and local radiation. This was ER PR negative and HER 2 + (prior to herceptin use)posterior congenital deafness  4.congenital deafness. Communicates by reading/ writing and sign language.  5.brief hospitalization last week for MS changes, resolved promptly, seemed medication and stress related. This was very atypical for her. She had not slept in several days prior to that problem.  6.genetic variants of unknown significance on genetic testing 01-2014  7. 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 Continuing B6. 8.NOTE peripheral IV access needs to be watched closely due to bilateral breast cancer interventions, but fortunately only sentinel nodes bilaterally. OK to use LUE for IV if needed (has not had RT on left yet). I expect we can carefully use peripheral veins to complete weekly taxol.     Time spent 45 min including >50% counseling and coordination of care. Cc this note Dr Lucia Gaskins. Chemo orders moved and addended with IV access  instructions.   Adonica Fukushima P, MD   08/06/2014, 11:12 AM

## 2014-08-06 NOTE — Telephone Encounter (Signed)
, °

## 2014-08-07 ENCOUNTER — Ambulatory Visit: Payer: BC Managed Care – PPO

## 2014-08-07 ENCOUNTER — Other Ambulatory Visit: Payer: Self-pay | Admitting: Oncology

## 2014-08-08 ENCOUNTER — Ambulatory Visit: Payer: BC Managed Care – PPO | Admitting: Oncology

## 2014-08-08 LAB — CULTURE, BLOOD (ROUTINE X 2)
Culture: NO GROWTH
Culture: NO GROWTH

## 2014-08-08 LAB — ANAEROBIC CULTURE

## 2014-08-10 ENCOUNTER — Ambulatory Visit (HOSPITAL_BASED_OUTPATIENT_CLINIC_OR_DEPARTMENT_OTHER): Payer: BC Managed Care – PPO

## 2014-08-10 ENCOUNTER — Telehealth: Payer: Self-pay | Admitting: *Deleted

## 2014-08-10 DIAGNOSIS — C50212 Malignant neoplasm of upper-inner quadrant of left female breast: Secondary | ICD-10-CM

## 2014-08-10 LAB — URINALYSIS, MICROSCOPIC - CHCC
Bilirubin (Urine): NEGATIVE
GLUCOSE UR CHCC: NEGATIVE mg/dL
Ketones: NEGATIVE mg/dL
Nitrite: NEGATIVE
PH: 5 (ref 4.6–8.0)
Protein: <30 mg/dL
SPECIFIC GRAVITY, URINE: 1.03 (ref 1.003–1.035)
UROBILINOGEN UR: 0.2 mg/dL (ref 0.2–1)

## 2014-08-10 MED ORDER — CIPROFLOXACIN HCL 250 MG PO TABS: ORAL_TABLET | ORAL | Status: DC

## 2014-08-10 NOTE — Telephone Encounter (Signed)
Patient called stating that "my private area has been burning and itchy since yesterday." She also said she is urinating more frequently and has a hard time holding it. Denying any fever. Asking what this could be. Told patient this could be symptoms of a    Per Dr. Marko Plume have patient come in for urine culture and send for C & S. Appointment made and orders placed for patient to come for lab today at 2pm. Patient agreeable to this.

## 2014-08-10 NOTE — Telephone Encounter (Signed)
Received back urinalysis from today - reviewed by Dr. Marko Plume and orders placed to start patient on Cipro 250mg  BID for 5 days. Left VM for patient and also for her sister, Regina Watts, that we are starting Regina Watts on an antibiotic and to please pick it up at [facility] and start taking tonight. Also, left on both voicemails that pt needs to keep appointments for Monday at the [facility]. Called [facility] and asked them to call patient once antibiotic is available as well.

## 2014-08-12 ENCOUNTER — Other Ambulatory Visit: Payer: Self-pay | Admitting: Oncology

## 2014-08-12 LAB — URINE CULTURE

## 2014-08-13 ENCOUNTER — Ambulatory Visit (HOSPITAL_BASED_OUTPATIENT_CLINIC_OR_DEPARTMENT_OTHER): Payer: BC Managed Care – PPO | Admitting: Oncology

## 2014-08-13 ENCOUNTER — Other Ambulatory Visit: Payer: BC Managed Care – PPO

## 2014-08-13 ENCOUNTER — Other Ambulatory Visit (HOSPITAL_BASED_OUTPATIENT_CLINIC_OR_DEPARTMENT_OTHER): Payer: BC Managed Care – PPO

## 2014-08-13 ENCOUNTER — Ambulatory Visit (HOSPITAL_BASED_OUTPATIENT_CLINIC_OR_DEPARTMENT_OTHER): Payer: BC Managed Care – PPO

## 2014-08-13 VITALS — BP 113/62 | HR 67 | Temp 98.2°F | Resp 18 | Ht 67.0 in | Wt 161.7 lb

## 2014-08-13 DIAGNOSIS — T451X5A Adverse effect of antineoplastic and immunosuppressive drugs, initial encounter: Secondary | ICD-10-CM

## 2014-08-13 DIAGNOSIS — G620 Drug-induced polyneuropathy: Secondary | ICD-10-CM

## 2014-08-13 DIAGNOSIS — Z5111 Encounter for antineoplastic chemotherapy: Secondary | ICD-10-CM

## 2014-08-13 DIAGNOSIS — C50212 Malignant neoplasm of upper-inner quadrant of left female breast: Secondary | ICD-10-CM

## 2014-08-13 DIAGNOSIS — G622 Polyneuropathy due to other toxic agents: Secondary | ICD-10-CM

## 2014-08-13 LAB — COMPREHENSIVE METABOLIC PANEL (CC13)
ALBUMIN: 3.7 g/dL (ref 3.5–5.0)
ALT: 60 U/L — AB (ref 0–55)
ANION GAP: 9 meq/L (ref 3–11)
AST: 31 U/L (ref 5–34)
Alkaline Phosphatase: 95 U/L (ref 40–150)
BUN: 14.8 mg/dL (ref 7.0–26.0)
CALCIUM: 10 mg/dL (ref 8.4–10.4)
CHLORIDE: 109 meq/L (ref 98–109)
CO2: 22 meq/L (ref 22–29)
Creatinine: 0.8 mg/dL (ref 0.6–1.1)
Glucose: 190 mg/dl — ABNORMAL HIGH (ref 70–140)
POTASSIUM: 4.1 meq/L (ref 3.5–5.1)
Sodium: 140 mEq/L (ref 136–145)
Total Bilirubin: 0.37 mg/dL (ref 0.20–1.20)
Total Protein: 6.8 g/dL (ref 6.4–8.3)

## 2014-08-13 LAB — CBC WITH DIFFERENTIAL/PLATELET
BASO%: 0.2 % (ref 0.0–2.0)
BASOS ABS: 0 10*3/uL (ref 0.0–0.1)
EOS%: 0.1 % (ref 0.0–7.0)
Eosinophils Absolute: 0 10*3/uL (ref 0.0–0.5)
HCT: 37.6 % (ref 34.8–46.6)
HGB: 12.7 g/dL (ref 11.6–15.9)
LYMPH#: 0.3 10*3/uL — AB (ref 0.9–3.3)
LYMPH%: 7.2 % — ABNORMAL LOW (ref 14.0–49.7)
MCH: 31.8 pg (ref 25.1–34.0)
MCHC: 33.7 g/dL (ref 31.5–36.0)
MCV: 94.2 fL (ref 79.5–101.0)
MONO#: 0 10*3/uL — AB (ref 0.1–0.9)
MONO%: 1.1 % (ref 0.0–14.0)
NEUT#: 4.3 10*3/uL (ref 1.5–6.5)
NEUT%: 91.4 % — ABNORMAL HIGH (ref 38.4–76.8)
Platelets: 218 10*3/uL (ref 145–400)
RBC: 3.99 10*6/uL (ref 3.70–5.45)
RDW: 14.2 % (ref 11.2–14.5)
WBC: 4.7 10*3/uL (ref 3.9–10.3)

## 2014-08-13 MED ORDER — ONDANSETRON 8 MG/50ML IVPB (CHCC)
8.0000 mg | Freq: Once | INTRAVENOUS | Status: AC
Start: 2014-08-13 — End: 2014-08-13
  Administered 2014-08-13: 8 mg via INTRAVENOUS

## 2014-08-13 MED ORDER — ONDANSETRON 8 MG/NS 50 ML IVPB
INTRAVENOUS | Status: AC
  Filled 2014-08-13: qty 8

## 2014-08-13 MED ORDER — DEXAMETHASONE SODIUM PHOSPHATE 20 MG/5ML IJ SOLN
INTRAMUSCULAR | Status: AC
  Filled 2014-08-13: qty 5

## 2014-08-13 MED ORDER — FAMOTIDINE IN NACL 20-0.9 MG/50ML-% IV SOLN
20.0000 mg | Freq: Once | INTRAVENOUS | Status: AC
  Administered 2014-08-13: 20 mg via INTRAVENOUS

## 2014-08-13 MED ORDER — FAMOTIDINE IN NACL 20-0.9 MG/50ML-% IV SOLN
INTRAVENOUS | Status: AC
  Filled 2014-08-13: qty 50

## 2014-08-13 MED ORDER — DIPHENHYDRAMINE HCL 50 MG/ML IJ SOLN
50.0000 mg | Freq: Once | INTRAMUSCULAR | Status: AC
  Administered 2014-08-13: 50 mg via INTRAVENOUS

## 2014-08-13 MED ORDER — DEXAMETHASONE SODIUM PHOSPHATE 20 MG/5ML IJ SOLN
20.0000 mg | Freq: Once | INTRAMUSCULAR | Status: AC
  Administered 2014-08-13: 20 mg via INTRAVENOUS

## 2014-08-13 MED ORDER — SODIUM CHLORIDE 0.9 % IV SOLN
Freq: Once | INTRAVENOUS | Status: AC
  Administered 2014-08-13: 12:00:00 via INTRAVENOUS

## 2014-08-13 MED ORDER — DEXAMETHASONE 4 MG PO TABS: ORAL_TABLET | ORAL | Status: DC

## 2014-08-13 MED ORDER — PACLITAXEL CHEMO INJECTION 300 MG/50ML
80.0000 mg/m2 | Freq: Once | INTRAVENOUS | Status: AC
  Administered 2014-08-13: 150 mg via INTRAVENOUS
  Filled 2014-08-13: qty 25

## 2014-08-13 MED ORDER — GABAPENTIN 100 MG PO CAPS: 100.0000 mg | ORAL_CAPSULE | Freq: Every day | ORAL | Status: DC

## 2014-08-13 MED ORDER — DIPHENHYDRAMINE HCL 50 MG/ML IJ SOLN
INTRAMUSCULAR | Status: AC
Start: 2014-08-13 — End: 2014-08-13
  Filled 2014-08-13: qty 1

## 2014-08-13 NOTE — Patient Instructions (Signed)
[facility] Discharge Instructions for Patients Receiving Chemotherapy  Today you received the following chemotherapy agents Paclitaxel.  To help prevent nausea and vomiting after your treatment, we encourage you to take your nausea medication as directed.    If you develop nausea and vomiting that is not controlled by your nausea medication, call the clinic.   BELOW ARE SYMPTOMS THAT SHOULD BE REPORTED IMMEDIATELY:  *FEVER GREATER THAN 100.5 F  *CHILLS WITH OR WITHOUT FEVER  NAUSEA AND VOMITING THAT IS NOT CONTROLLED WITH YOUR NAUSEA MEDICATION  *UNUSUAL SHORTNESS OF BREATH  *UNUSUAL BRUISING OR BLEEDING  TENDERNESS IN MOUTH AND THROAT WITH OR WITHOUT PRESENCE OF ULCERS  *URINARY PROBLEMS  *BOWEL PROBLEMS  UNUSUAL RASH Items with * indicate a potential emergency and should be followed up as soon as possible.  Feel free to call the clinic you have any questions or concerns. The clinic phone number is [phone].    

## 2014-08-14 ENCOUNTER — Ambulatory Visit (HOSPITAL_BASED_OUTPATIENT_CLINIC_OR_DEPARTMENT_OTHER): Payer: BC Managed Care – PPO

## 2014-08-14 ENCOUNTER — Other Ambulatory Visit: Payer: Self-pay | Admitting: Oncology

## 2014-08-14 ENCOUNTER — Encounter: Payer: Self-pay | Admitting: Oncology

## 2014-08-14 VITALS — BP 131/71 | HR 67 | Temp 98.1°F

## 2014-08-14 DIAGNOSIS — G620 Drug-induced polyneuropathy: Secondary | ICD-10-CM | POA: Insufficient documentation

## 2014-08-14 DIAGNOSIS — T451X5A Adverse effect of antineoplastic and immunosuppressive drugs, initial encounter: Secondary | ICD-10-CM

## 2014-08-14 DIAGNOSIS — C50212 Malignant neoplasm of upper-inner quadrant of left female breast: Secondary | ICD-10-CM

## 2014-08-14 DIAGNOSIS — Z5189 Encounter for other specified aftercare: Secondary | ICD-10-CM

## 2014-08-14 MED ORDER — TBO-FILGRASTIM 300 MCG/0.5ML ~~LOC~~ SOSY
300.0000 ug | PREFILLED_SYRINGE | Freq: Once | SUBCUTANEOUS | Status: AC
  Administered 2014-08-14: 300 ug via SUBCUTANEOUS
  Filled 2014-08-14: qty 0.5

## 2014-08-14 NOTE — Progress Notes (Signed)
Medical Oncology Office Note 08-13-2014  Physicians: Alphonsa Overall, Tyler Pita, Karrar Husein, Arvella Nigh, Roni Bread Lomax   INTERVAL HISTORY:  Patient is seen, together with interpreter, in continuing attention to adjuvant chemotherapy which we are trying to continue for T1cN0 triple negative left breast cancer, treatment held since #7 weekly taxol 07-23-14 due to [facility] infection and peripheral neuropathy concerns. She will have week 8 taxol today. She needs neupogen/ granix day after each taxol.  Kateleen has had no further fevers and blood and wound cultures from clinically obvious PAC infection all finalized negative. [facility] has been to home twice, but without interpreter, and have not been able to give her exact times for visits, so that she has to watch the door. Keyerra has done own dressing changes, but is out of dressing materials. She saw Dr Lucia Gaskins on 08-10-14, wound doing well then. She has a little itching at site but no pain, no bleeding, no tenderness.  She had UTI symptoms on 08-10-14 which have resolved with cipro (5 days to complete 08-14-14), tho urine culture has shown no growth despite UA findings.  Peripheral neuropathy is ~ same, from distal phalangeal joints to tips of fingers, tingling, not painful. Feet are bothersome in tight shoes, no difficulty walking. She understands that the neuropathy might progress with further chemo, however with overall situation with cancer, we will carefully try week by week with the low dose taxol. As she is also not sleeping well, will add gabapentin 100 mg at hs and could increase this dose and frequency if helpful/ needed. A college friend is staying in the home at night.  Husband may come back to [place] end of this week.  Versia has had 2 sentinel nodes + radiation on right (2002) and one sentinel node + no radiation so far on left. We have discussed peripheral blood draws and peripheral IVs since she no longer has central line, these  okay carefully on either side.   No central line since PAC removed 07-11-14 Flu vaccine done BRCA with 3 variants of uncertain significance 01-2014   ONCOLOGIC HISTORY RIGHT BREAST CANCER: Right breast cancer diagnosed January 2002 when patient was age 3 and premenopausal, T2N0M0 (4cm grade 3 invasive ductal carcinoma, <25% high grade DCIS, S phase 8.6%), ER negative, PR negative, HER 2 positive. She had lumpectomy with 2 negative sentinel nodes, adjuvant chemotherapy by Dr Starr Sinclair with adriamycin cytoxan x 4 cycles from March 2002 thru May 2002 (total adriamycin dose 409 mg with weight 142, height 5'7", for total adriamycin 235 mg/m2), and local radiation (50.4 Gy with boost to 60.4) Gy Note she declined PAC and received the chemotherapy in 2002 by peripheral vein. I followed her beginning Sept 2003 until last previous visit 07-2011. She declined TEACH trial with lapatinib in 2007 and 2008. Last breast MRI previously was 08-2011.  Patient had infertility treatments at [facility] in 2005, daughter born 02-01-2005. Menses had been very infrequent after chemo until the infertility treatment, then stopped after that pregnancy.   LEFT BREAST CANCER: Mammograms at Dr Sherran Needs office early 12-2013 had area questioned on left compared with last prior mammograms of 08-2012. Diagnostic left mammogram and [place] at [facility] on 01-01-14 showed a mass in left upper inner breast posteriorly 1.5 x 1.4 x 1.3 cm on [place], with no abnormal left axillary nodes identified. She had biopsy on 01-01-14 with pathology ([id number]) showing grade 1-2 invasive ductal carcinoma ER, PR and HER 2 negative with Ki 66%. She  had breast MRI 01-15-2014 with solitary enhancing mass upper inner left breast 1.7 x 2.0 x 1.8 cm, right breast not remarkable, no abnormal appearing lymph nodes. She had CT CAP 01-29-14 then MRI liver 02-05-14 with no evidence of metastatic disease. Genetics testing with Breast/Ovarian Cancer Gene Panel 01-2014 did not reveal a  pathogenic mutation in these genes, tho she did have 3 variants of uncertain significance (ATM, [id number]; ATM, c.8674G>A and PMS2, C.1309C>T). She had consultation with Dr Tammi Klippel prior to definitive surgery. She had lumpectomy and left axillary sentinel node biopsy by Dr Lucia Gaskins on 03-06-2014. Pathology [id number]) found 1.8 cm grade II invasive ductal carcinoma with one negative sentinel node and final margins negative. She had PAC placed by Dr Lucia Gaskins 03-06-14/  Chemotherapy began with 2 cycles of dose dense AC on 03-30-14 and 04-13-14, then one treatment of cytoxan taxotere on 0-26-37 complicated by severe rash and hand foot syndrome. She began weekly taxol on 05-21-14, planned x 12 weeks. Expect local radiation after chemotherapy. PAC was removed for infection 07-11-14.  Review of systems as above, also: Appetite and energy good now, no significant nausea. Bowels ok. Sleeping 4-6 hours at night at most. Remainder of 10 point Review of Systems negative.  Objective:  Vital signs in last 24 hours:  BP 113/62  Pulse 67  Temp(Src) 98.2 F (36.8 C) (Oral)  Resp 18  Ht _0  (1.702 m)  Wt 161 lb 11.2 oz (73.347 kg)  BMI 25.32 kg/m2 weight up 2 lbs.  Alert, oriented and appropriate. Ambulatory without difficulty.  Alopecia  HEENT:PERRL, sclerae not icteric. Oral mucosa moist without lesions, posterior pharynx clear.  Neck supple. No JVD.  Lymphatics:no cervical,suraclavicular, axillary adenopathy Resp: clear to auscultation bilaterally and normal percussion bilaterally Cardio: regular rate and rhythm. No gallop. Chest wall: site of previous PAC clean and dry, packed with gauze but no gauze covering, paper tape. Not tender. GI: soft, nontender, not distended, no mass or organomegaly. Normally active bowel sounds.  Musculoskeletal/ Extremities: without pitting edema, cords, tenderness. Site of peripheral blood draw from right hand today ok. No problems at other IV sites from hospitalization  RUE. Neuro:  peripheral neuropathy with decreased light touch finger tips to distal phalangeal joints. Otherwise nonfocal. PSYCH appropriate mood and affect Skin without rash, ecchymosis, petechiae Breasts: exam not repeated   Lab Results:  Results for orders placed in visit on 08/13/14  CBC WITH DIFFERENTIAL      Result Value Ref Range   WBC 4.7  3.9 - 10.3 10e3/uL   NEUT# 4.3  1.5 - 6.5 10e3/uL   HGB 12.7  11.6 - 15.9 g/dL   HCT 37.6  34.8 - 46.6 %   Platelets 218  145 - 400 10e3/uL   MCV 94.2  79.5 - 101.0 fL   MCH 31.8  25.1 - 34.0 pg   MCHC 33.7  31.5 - 36.0 g/dL   RBC 3.99  3.70 - 5.45 10e6/uL   RDW 14.2  11.2 - 14.5 %   lymph# 0.3 (*) 0.9 - 3.3 10e3/uL   MONO# 0.0 (*) 0.1 - 0.9 10e3/uL   Eosinophils Absolute 0.0  0.0 - 0.5 10e3/uL   Basophils Absolute 0.0  0.0 - 0.1 10e3/uL   NEUT% 91.4 (*) 38.4 - 76.8 %   LYMPH% 7.2 (*) 14.0 - 49.7 %   MONO% 1.1  0.0 - 14.0 %   EOS% 0.1  0.0 - 7.0 %   BASO% 0.2  0.0 - 2.0 %  COMPREHENSIVE METABOLIC PANEL (  CC13)      Result Value Ref Range   Sodium 140  136 - 145 mEq/L   Potassium 4.1  3.5 - 5.1 mEq/L   Chloride 109  98 - 109 mEq/L   CO2 22  22 - 29 mEq/L   Glucose 190 (*) 70 - 140 mg/dl   BUN 14.8  7.0 - 26.0 mg/dL   Creatinine 0.8  0.6 - 1.1 mg/dL   Total Bilirubin 0.37  0.20 - 1.20 mg/dL   Alkaline Phosphatase 95  40 - 150 U/L   AST 31  5 - 34 U/L   ALT 60 (*) 0 - 55 U/L   Total Protein 6.8  6.4 - 8.3 g/dL   Albumin 3.7  3.5 - 5.0 g/dL   Calcium 10.0  8.4 - 10.4 mg/dL   Anion Gap 9  3 - 11 mEq/L     Studies/Results:  No results found.  Medications: I have reviewed the patient's current medications. Add gabapentin 100 mg at hs now, can increase if needed.  DISCUSSION: IV access, neuropathy as above. Will try to give some dressing supplies if possible.  Assessment/Plan: 1.Triple negative left breast cancer T1cN0 diagnosed 12-2013, treated with lumpectomy and sentinel node evaluation 03-06-14. Adjuvant chemotherapy in  process, with 5 more weekly taxol treatments planned (depending on neuropathy), then local radiation. Weeks 4 and 7 delayed due to low counts, and week 8 delayed due to hospitalizations/ PAC infection. Will give week 8 today and continue beyond that on week by week basis as possible. Neupogen day after each taxol. I will see her with Rx on 08-20-14. RT after chemo 2.PAC removed 08-03-14 for clinically obvious infection. Wound care continues [place] has not gone smoothly (since husband returned to his job in [place]) 3.history T2N0M0 right breast cancer 11-2000 at age 51 treated with lumpectomy, 2 sentinel nodes, chemotherapy and local radiation. This was ER PR negative and HER 2 + (prior to herceptin use) 4.congenital deafness. Communicates by reading/ writing and sign language.  5.brief hospitalization for MS changes, resolved promptly, seemed medication and stress related. This was very atypical for her. She had not slept in several days prior to that problem.  6.genetic variants of unknown significance on genetic testing 01-2014  7. 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 Continuing B6.  8.NOTE peripheral IV access needs to be watched closely due to bilateral breast cancer interventions, but fortunately only sentinel nodes bilaterally. OK to use LUE for IV if needed (has not had RT on left yet). I expect we can carefully use peripheral veins to complete weekly taxol.  9. UTI by symptoms and UA on 10-2, improved with short course cipro tho culture negative.   Chemo orders confirmed. All questions answered and patient understood all of discussion and plan with excellent assistance from sign language interpreter.    Gordy Levan, MD

## 2014-08-15 ENCOUNTER — Encounter: Payer: Self-pay | Admitting: Oncology

## 2014-08-15 NOTE — Progress Notes (Signed)
Patient's insurance is paying Granix and can't get asst with Neupogen with medicare insurance.

## 2014-08-17 ENCOUNTER — Telehealth: Payer: Self-pay

## 2014-08-17 NOTE — Telephone Encounter (Signed)
Regina Watts called stating that she saw her PCP Dr. Lysle Rubens 08-16-14.  Her cholesterol is very high and he wants her to resume her Crestor.  Would that be ok with Dr. Marko Plume.  Dr. Marko Plume had her stop the Crestor a few months ago.

## 2014-08-17 NOTE — Telephone Encounter (Signed)
Lm for Regina Watts that Dr. Marko Plume will review Dr. Glenna Durand note and labs  from 08-16-14 and will let her know if it is fine to resume Crestor.

## 2014-08-19 ENCOUNTER — Other Ambulatory Visit: Payer: Self-pay | Admitting: Oncology

## 2014-08-20 ENCOUNTER — Other Ambulatory Visit (HOSPITAL_BASED_OUTPATIENT_CLINIC_OR_DEPARTMENT_OTHER): Payer: BC Managed Care – PPO

## 2014-08-20 ENCOUNTER — Telehealth: Payer: Self-pay | Admitting: Oncology

## 2014-08-20 ENCOUNTER — Telehealth: Payer: Self-pay | Admitting: *Deleted

## 2014-08-20 ENCOUNTER — Telehealth: Payer: Self-pay

## 2014-08-20 ENCOUNTER — Ambulatory Visit (HOSPITAL_BASED_OUTPATIENT_CLINIC_OR_DEPARTMENT_OTHER): Payer: BC Managed Care – PPO | Admitting: Oncology

## 2014-08-20 ENCOUNTER — Encounter: Payer: Self-pay | Admitting: Oncology

## 2014-08-20 ENCOUNTER — Ambulatory Visit (HOSPITAL_BASED_OUTPATIENT_CLINIC_OR_DEPARTMENT_OTHER): Payer: BC Managed Care – PPO

## 2014-08-20 VITALS — BP 124/71 | HR 74 | Temp 98.0°F | Resp 20 | Ht 67.0 in | Wt 160.6 lb

## 2014-08-20 DIAGNOSIS — C50212 Malignant neoplasm of upper-inner quadrant of left female breast: Secondary | ICD-10-CM

## 2014-08-20 DIAGNOSIS — G620 Drug-induced polyneuropathy: Secondary | ICD-10-CM

## 2014-08-20 DIAGNOSIS — Z5111 Encounter for antineoplastic chemotherapy: Secondary | ICD-10-CM | POA: Diagnosis not present

## 2014-08-20 DIAGNOSIS — C50912 Malignant neoplasm of unspecified site of left female breast: Secondary | ICD-10-CM

## 2014-08-20 LAB — CBC WITH DIFFERENTIAL/PLATELET
BASO%: 0 % (ref 0.0–2.0)
BASOS ABS: 0 10*3/uL (ref 0.0–0.1)
EOS%: 0 % (ref 0.0–7.0)
Eosinophils Absolute: 0 10*3/uL (ref 0.0–0.5)
HEMATOCRIT: 36.7 % (ref 34.8–46.6)
HEMOGLOBIN: 12.7 g/dL (ref 11.6–15.9)
LYMPH%: 7.9 % — ABNORMAL LOW (ref 14.0–49.7)
MCH: 31.5 pg (ref 25.1–34.0)
MCHC: 34.6 g/dL (ref 31.5–36.0)
MCV: 91.1 fL (ref 79.5–101.0)
MONO#: 0 10*3/uL — AB (ref 0.1–0.9)
MONO%: 0.2 % (ref 0.0–14.0)
NEUT#: 4.3 10*3/uL (ref 1.5–6.5)
NEUT%: 91.9 % — AB (ref 38.4–76.8)
Platelets: 161 10*3/uL (ref 145–400)
RBC: 4.03 10*6/uL (ref 3.70–5.45)
RDW: 12.8 % (ref 11.2–14.5)
WBC: 4.7 10*3/uL (ref 3.9–10.3)
lymph#: 0.4 10*3/uL — ABNORMAL LOW (ref 0.9–3.3)

## 2014-08-20 LAB — COMPREHENSIVE METABOLIC PANEL (CC13)
ALT: 51 U/L (ref 0–55)
AST: 25 U/L (ref 5–34)
Albumin: 3.8 g/dL (ref 3.5–5.0)
Alkaline Phosphatase: 102 U/L (ref 40–150)
Anion Gap: 8 meq/L (ref 3–11)
BUN: 14.2 mg/dL (ref 7.0–26.0)
CO2: 22 meq/L (ref 22–29)
Calcium: 9.8 mg/dL (ref 8.4–10.4)
Chloride: 109 meq/L (ref 98–109)
Creatinine: 0.8 mg/dL (ref 0.6–1.1)
Glucose: 197 mg/dL — ABNORMAL HIGH (ref 70–140)
Potassium: 4.2 meq/L (ref 3.5–5.1)
Sodium: 139 meq/L (ref 136–145)
Total Bilirubin: 0.5 mg/dL (ref 0.20–1.20)
Total Protein: 6.9 g/dL (ref 6.4–8.3)

## 2014-08-20 MED ORDER — ONDANSETRON 8 MG/50ML IVPB (CHCC)
8.0000 mg | Freq: Once | INTRAVENOUS | Status: AC
  Administered 2014-08-20: 8 mg via INTRAVENOUS

## 2014-08-20 MED ORDER — DIPHENHYDRAMINE HCL 50 MG/ML IJ SOLN
50.0000 mg | Freq: Once | INTRAMUSCULAR | Status: AC
  Administered 2014-08-20: 50 mg via INTRAVENOUS

## 2014-08-20 MED ORDER — PACLITAXEL CHEMO INJECTION 300 MG/50ML
80.0000 mg/m2 | Freq: Once | INTRAVENOUS | Status: AC
  Administered 2014-08-20: 150 mg via INTRAVENOUS
  Filled 2014-08-20: qty 25

## 2014-08-20 MED ORDER — FAMOTIDINE IN NACL 20-0.9 MG/50ML-% IV SOLN
20.0000 mg | Freq: Once | INTRAVENOUS | Status: AC
  Administered 2014-08-20: 20 mg via INTRAVENOUS

## 2014-08-20 MED ORDER — SODIUM CHLORIDE 0.9 % IV SOLN
Freq: Once | INTRAVENOUS | Status: AC
  Administered 2014-08-20: 11:00:00 via INTRAVENOUS

## 2014-08-20 MED ORDER — FAMOTIDINE IN NACL 20-0.9 MG/50ML-% IV SOLN
INTRAVENOUS | Status: AC
  Filled 2014-08-20: qty 50

## 2014-08-20 MED ORDER — DEXAMETHASONE SODIUM PHOSPHATE 20 MG/5ML IJ SOLN
INTRAMUSCULAR | Status: AC
  Filled 2014-08-20: qty 5

## 2014-08-20 MED ORDER — DEXAMETHASONE SODIUM PHOSPHATE 20 MG/5ML IJ SOLN
20.0000 mg | Freq: Once | INTRAMUSCULAR | Status: AC
  Administered 2014-08-20: 20 mg via INTRAVENOUS

## 2014-08-20 MED ORDER — ONDANSETRON 8 MG/NS 50 ML IVPB
INTRAVENOUS | Status: AC
  Filled 2014-08-20: qty 8

## 2014-08-20 MED ORDER — DIPHENHYDRAMINE HCL 50 MG/ML IJ SOLN
INTRAMUSCULAR | Status: AC
  Filled 2014-08-20: qty 1

## 2014-08-20 MED ORDER — HEPARIN SOD (PORK) LOCK FLUSH 100 UNIT/ML IV SOLN
500.0000 [IU] | Freq: Once | INTRAVENOUS | Status: DC | PRN
  Filled 2014-08-20: qty 5

## 2014-08-20 MED ORDER — SODIUM CHLORIDE 0.9 % IJ SOLN
10.0000 mL | INTRAMUSCULAR | Status: DC | PRN
  Filled 2014-08-20: qty 10

## 2014-08-20 NOTE — Telephone Encounter (Signed)
per pof to sch pt appt-sent email to MW to coorodinate 11/2 appt w/MD appt-adv pt will mail updated copy

## 2014-08-20 NOTE — Telephone Encounter (Signed)
Faxed signed orders dated 08-20-14  to [place] and sent a copy to HIM to be scanned into patient's EMR.

## 2014-08-20 NOTE — Patient Instructions (Signed)
[facility] Discharge Instructions for Patients Receiving Chemotherapy  Today you received the following chemotherapy agents Taxol  To help prevent nausea and vomiting after your treatment, we encourage you to take your nausea medication    If you develop nausea and vomiting that is not controlled by your nausea medication, call the clinic.   BELOW ARE SYMPTOMS THAT SHOULD BE REPORTED IMMEDIATELY:  *FEVER GREATER THAN 100.5 F  *CHILLS WITH OR WITHOUT FEVER  NAUSEA AND VOMITING THAT IS NOT CONTROLLED WITH YOUR NAUSEA MEDICATION  *UNUSUAL SHORTNESS OF BREATH  *UNUSUAL BRUISING OR BLEEDING  TENDERNESS IN MOUTH AND THROAT WITH OR WITHOUT PRESENCE OF ULCERS  *URINARY PROBLEMS  *BOWEL PROBLEMS  UNUSUAL RASH Items with * indicate a potential emergency and should be followed up as soon as possible.  Feel free to call the clinic you have any questions or concerns. The clinic phone number is [phone].    

## 2014-08-20 NOTE — Telephone Encounter (Signed)
Per patient request and POF I have scheduled appts. Patient given calendar.   JMW

## 2014-08-20 NOTE — Progress Notes (Signed)
OFFICE PROGRESS NOTE   08/20/2014   Physicians:David Cristy Hilts, Patsy Lager, Roni Bread Lomax   INTERVAL HISTORY:  Patient is seen, together with sign language interpreter, in continuing attention to adjuvant chemotherapy in process for T1cN0 triple negative left breast cancer, due #9 weekly taxol today. Peripheral neuropathy in fingers and feet may not allow all of planned 12 cycles of the weekly taxol, however she cannot tell any worsening of symptoms since treatment on 08-13-14. [place]ley continues to do dressing changes at site of removed PAC, this improving. [facility] is following ~ once weekly for wound, tho no sign language interpreter with their visits yet.  She needs neupogen day after each chemo.  Peripheral IV access in LUE was ok for taxol on 08-13-14, with some bruising but no tenderness or other local concern after IV removed. Note she had 2 sentinel nodes + RT with previous right breast cancer, and 1 sentinel node 03-06-14/ no RT yet with this left breast cancer. [place] staff being very careful with peripheral venous access procedures since no central line now.  Shemika has felt very well otherwise this week, with no nausea, no fever, no bleeding, no MS concerns. Appetite and energy are very adequate. She has no pain at wound site. Neuropathy is tips of fingers to distal phalangeal joint bilaterally and ball of foot/ all of toes bilaterally. She is more uncomfortable with tight shoes and is careful walking on gravel. Gabapentin 100 mg at hs has not seemed to help sleep or the neuropathy, patient still sleeping only 5-6 hours last pm (see Medications below)  PAC removed for infection 08-03-14. Flu vaccine done BRCA 3 variants of uncertain significance 01-2014  ONCOLOGIC HISTORY RIGHT BREAST CANCER: Right breast cancer diagnosed January 2002 when patient was age 89 and premenopausal, T2N0M0 (4cm grade 3 invasive ductal carcinoma, <25% high grade  DCIS, S phase 8.6%), ER negative, PR negative, HER 2 positive. She had lumpectomy with 2 negative sentinel nodes, adjuvant chemotherapy by Dr Starr Sinclair with adriamycin cytoxan x 4 cycles from March 2002 thru May 2002 (total adriamycin dose 409 mg with weight 142, height 5'7", for total adriamycin 235 mg/m2), and local radiation (50.4 Gy with boost to 60.4) Gy Note she declined PAC and received the chemotherapy in 2002 by peripheral vein. I followed her beginning Sept 2003 until last previous visit 07-2011. She declined TEACH trial with lapatinib in 2007 and 2008. Last breast MRI previously was 08-2011.  Patient had infertility treatments at [facility] in 2005, daughter born 02-01-2005. Menses had been very infrequent after chemo until the infertility treatment, then stopped after that pregnancy.  LEFT BREAST CANCER: Mammograms at Dr Sherran Needs office early 12-2013 had area questioned on left compared with last prior mammograms of 08-2012. Diagnostic left mammogram and [place] at [facility] on 01-01-14 showed a mass in left upper inner breast posteriorly 1.5 x 1.4 x 1.3 cm on [place], with no abnormal left axillary nodes identified. She had biopsy on 01-01-14 with pathology ([id number]) showing grade 1-2 invasive ductal carcinoma ER, PR and HER 2 negative with Ki 66%. She had breast MRI 01-15-2014 with solitary enhancing mass upper inner left breast 1.7 x 2.0 x 1.8 cm, right breast not remarkable, no abnormal appearing lymph nodes. She had CT CAP 01-29-14 then MRI liver 02-05-14 with no evidence of metastatic disease. Genetics testing with Breast/Ovarian Cancer Gene Panel 01-2014 did not reveal a pathogenic mutation in these genes, tho she did have 3 variants of  uncertain significance (ATM, [phone]; ATM, c.8674G>A and PMS2, C.1309C>T). She had consultation with Dr Tammi Klippel prior to definitive surgery. She had lumpectomy and left axillary sentinel node biopsy by Dr Lucia Gaskins on 03-06-2014. Pathology [id number]) found 1.8 cm grade II  invasive ductal carcinoma with one negative sentinel node and final margins negative. She had PAC placed by Dr Lucia Gaskins 03-06-14/  Chemotherapy began with 2 cycles of dose dense AC on 03-30-14 and 04-13-14, then one treatment of cytoxan taxotere on 07-07-99 complicated by severe rash and hand foot syndrome. She began weekly taxol on 05-21-14, planned x 12 weeks. Expect local radiation after chemotherapy. PAC was removed for infection 07-11-14.  Review of systems as above, also: Bowels fine. No LE swelling. No SOB or other respiratory symptoms. Remainder of 10 point Review of Systems negative.  Objective:  Vital signs in last 24 hours: wgt 160 lb 9 oz   124/71, 74 regular, 20 not labored RA, 98  Alert, oriented and appropriate. Ambulatory without difficulty. Communicates easily with assistance of sign language interpreter. Alopecia  HEENT:PERRL, sclerae not icteric. Oral mucosa moist without lesions, posterior pharynx clear.  Neck supple. No JVD.  Lymphatics:no cervical,suraclavicular, axillaryl adenopathy Resp: clear to auscultation bilaterally  Cardio: regular rate and rhythm. No gallop. GI: soft, nontender, not distended, no mass or organomegaly. Normally active bowel sounds.  Musculoskeletal/ Extremities: without pitting edema, cords, tenderness Neuro: sensory peripheral neuropathy as described. Otherwise nonfocal. PSYCH: MS seems at baseline to myself and to sign language interpreter. Skin: resolving ecchymoses left forearm and left antecubital from IV access, otherwise without rash, petechiae Breast exam not repeated now Portacath- wound packed, gauze adherent now but appears smaller and no surrounding tenderness or erythema, dressings dry.  Lab Results:  Results for orders placed in visit on 08/13/14  CBC WITH DIFFERENTIAL      Result Value Ref Range   WBC 4.7  3.9 - 10.3 10e3/uL   NEUT# 4.3  1.5 - 6.5 10e3/uL   HGB 12.7  11.6 - 15.9 g/dL   HCT 37.6  34.8 - 46.6 %   Platelets 218  145  - 400 10e3/uL   MCV 94.2  79.5 - 101.0 fL   MCH 31.8  25.1 - 34.0 pg   MCHC 33.7  31.5 - 36.0 g/dL   RBC 3.99  3.70 - 5.45 10e6/uL   RDW 14.2  11.2 - 14.5 %   lymph# 0.3 (*) 0.9 - 3.3 10e3/uL   MONO# 0.0 (*) 0.1 - 0.9 10e3/uL   Eosinophils Absolute 0.0  0.0 - 0.5 10e3/uL   Basophils Absolute 0.0  0.0 - 0.1 10e3/uL   NEUT% 91.4 (*) 38.4 - 76.8 %   LYMPH% 7.2 (*) 14.0 - 49.7 %   MONO% 1.1  0.0 - 14.0 %   EOS% 0.1  0.0 - 7.0 %   BASO% 0.2  0.0 - 2.0 %  COMPREHENSIVE METABOLIC PANEL (LK91)      Result Value Ref Range   Sodium 140  136 - 145 mEq/L   Potassium 4.1  3.5 - 5.1 mEq/L   Chloride 109  98 - 109 mEq/L   CO2 22  22 - 29 mEq/L   Glucose 190 (*) 70 - 140 mg/dl   BUN 14.8  7.0 - 26.0 mg/dL   Creatinine 0.8  0.6 - 1.1 mg/dL   Total Bilirubin 0.37  0.20 - 1.20 mg/dL   Alkaline Phosphatase 95  40 - 150 U/L   AST 31  5 - 34 U/L  ALT 60 (*) 0 - 55 U/L   Total Protein 6.8  6.4 - 8.3 g/dL   Albumin 3.7  3.5 - 5.0 g/dL   Calcium 10.0  8.4 - 10.4 mg/dL   Anion Gap 9  3 - 11 mEq/L     Studies/Results:  No results found.  Medications: I have reviewed the patient's current medications. She will increase gabapentin to 300 mg at hs, using the 100 mg tablets already prescribed. If this is helpful, can do next script for higher milligram tablet strength.  DISCUSSION: with no worsening of taxol-related neuropathy in past week, she is in agreement with recommendation that we carefully continue with cycle 9 today. She understands that the chemotherapy is felt important from standpoint of this triple negative breast cancer.  Dr Husain's office has sent message that he would like her to resume Crestor - ok. He is also concerned that blood sugars are more elevated, likely with weekly premed decadron for taxol, tho Lorenzo tells me that she has had borderline elevated blood sugars prior to this chemo. It still may be appropriate to wait until after chemo/ steroids complete prior to making decision  about need for other treatment of elevated blood sugars. I will send this note to Dr Lysle Rubens   Assessment/Plan:  1.Triple negative left breast cancer T1cN0 diagnosed 12-2013, treated with lumpectomy and sentinel node evaluation 03-06-14. Adjuvant chemotherapy in process, with 4 more weekly taxol treatments planned (depending on neuropathy), then local radiation. Weeks 4 and 7 delayed due to low counts, and week 8 delayed due to hospitalizations/ PAC infection. Will give week 9 today and continue beyond that on week by week basis as possible. Neupogen day after each taxol. I will see her with Rx on 10-19. 2.PAC removed 08-03-14 for clinically obvious infection. Wound care continuing, wound improving. 3.history T2N0M0 right breast cancer 11-2000 at age 17 treated with lumpectomy, 2 sentinel nodes, chemotherapy and local radiation. This was ER PR negative and HER 2 + (prior to herceptin use)  4.congenital deafness. Communicates by reading/ writing and sign language.  5.brief hospitalization for MS changes, resolved promptly, seemed medication and stress related. This was very atypical for her. She had not slept in several days prior to that problem.  6.genetic variants of unknown significance on genetic testing 01-2014  7. 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 Continuing B6.  8.NOTE peripheral IV access needs to be watched closely due to bilateral breast cancer interventions, but fortunately only sentinel nodes bilaterally. OK to use LUE for IV if needed (has not had RT on left yet). I expect we can carefully use peripheral veins to complete weekly taxol.    All questions answered with excellent assistance from interpreter. Time spent 30 min including >50% counseling and coordination of care. Chemo and gCSF orders confirmed.    Naomi Fitton P, MD   08/20/2014, 9:20 AM

## 2014-08-21 ENCOUNTER — Ambulatory Visit (HOSPITAL_BASED_OUTPATIENT_CLINIC_OR_DEPARTMENT_OTHER): Payer: BC Managed Care – PPO

## 2014-08-21 VITALS — BP 111/72 | HR 84 | Temp 98.9°F

## 2014-08-21 DIAGNOSIS — C50212 Malignant neoplasm of upper-inner quadrant of left female breast: Secondary | ICD-10-CM

## 2014-08-21 DIAGNOSIS — Z5189 Encounter for other specified aftercare: Secondary | ICD-10-CM

## 2014-08-21 MED ORDER — TBO-FILGRASTIM 300 MCG/0.5ML ~~LOC~~ SOSY
300.0000 ug | PREFILLED_SYRINGE | Freq: Once | SUBCUTANEOUS | Status: AC
  Administered 2014-08-21: 300 ug via SUBCUTANEOUS
  Filled 2014-08-21: qty 0.5

## 2014-08-23 NOTE — Telephone Encounter (Addendum)
Called and left VM for Dr. Glenna Durand nurse that it is okay per Dr. Marko Plume for pt to resume crestor. Also, let RN know that pt takes decadron tablets prior to her weekly taxol chemo - Dr. Marko Plume doesn't think pt is diabetic and that elevated blood sugars are from the decadron. Dr. Marko Plume suggests that Dr. Lysle Rubens recheck blood sugar a month after taxol treatments are finished. LL last office visit note faxed to Dr. Lysle Rubens. Told nurse to call us back at [phone] with any questions.

## 2014-08-26 ENCOUNTER — Other Ambulatory Visit: Payer: Self-pay | Admitting: Oncology

## 2014-08-27 ENCOUNTER — Encounter: Payer: Self-pay | Admitting: Oncology

## 2014-08-27 ENCOUNTER — Ambulatory Visit (HOSPITAL_BASED_OUTPATIENT_CLINIC_OR_DEPARTMENT_OTHER): Payer: BC Managed Care – PPO | Admitting: Oncology

## 2014-08-27 ENCOUNTER — Other Ambulatory Visit (HOSPITAL_BASED_OUTPATIENT_CLINIC_OR_DEPARTMENT_OTHER): Payer: BC Managed Care – PPO

## 2014-08-27 ENCOUNTER — Ambulatory Visit (HOSPITAL_BASED_OUTPATIENT_CLINIC_OR_DEPARTMENT_OTHER): Payer: BC Managed Care – PPO

## 2014-08-27 VITALS — BP 126/69 | HR 90 | Temp 98.3°F | Resp 16 | Ht 67.0 in | Wt 160.0 lb

## 2014-08-27 DIAGNOSIS — C50212 Malignant neoplasm of upper-inner quadrant of left female breast: Secondary | ICD-10-CM

## 2014-08-27 DIAGNOSIS — Z171 Estrogen receptor negative status [ER-]: Secondary | ICD-10-CM | POA: Diagnosis not present

## 2014-08-27 DIAGNOSIS — G620 Drug-induced polyneuropathy: Secondary | ICD-10-CM

## 2014-08-27 DIAGNOSIS — Z5111 Encounter for antineoplastic chemotherapy: Secondary | ICD-10-CM

## 2014-08-27 DIAGNOSIS — T451X5A Adverse effect of antineoplastic and immunosuppressive drugs, initial encounter: Secondary | ICD-10-CM

## 2014-08-27 LAB — CBC WITH DIFFERENTIAL/PLATELET
BASO%: 0.4 % (ref 0.0–2.0)
Basophils Absolute: 0 10*3/uL (ref 0.0–0.1)
EOS%: 0 % (ref 0.0–7.0)
Eosinophils Absolute: 0 10*3/uL (ref 0.0–0.5)
HCT: 38.8 % (ref 34.8–46.6)
HEMOGLOBIN: 12.9 g/dL (ref 11.6–15.9)
LYMPH%: 11.2 % — ABNORMAL LOW (ref 14.0–49.7)
MCH: 31.2 pg (ref 25.1–34.0)
MCHC: 33.2 g/dL (ref 31.5–36.0)
MCV: 93.8 fL (ref 79.5–101.0)
MONO#: 0 10*3/uL — ABNORMAL LOW (ref 0.1–0.9)
MONO%: 1.1 % (ref 0.0–14.0)
NEUT#: 2.2 10*3/uL (ref 1.5–6.5)
NEUT%: 87.3 % — ABNORMAL HIGH (ref 38.4–76.8)
PLATELETS: 212 10*3/uL (ref 145–400)
RBC: 4.13 10*6/uL (ref 3.70–5.45)
RDW: 13.9 % (ref 11.2–14.5)
WBC: 2.5 10*3/uL — ABNORMAL LOW (ref 3.9–10.3)
lymph#: 0.3 10*3/uL — ABNORMAL LOW (ref 0.9–3.3)

## 2014-08-27 LAB — COMPREHENSIVE METABOLIC PANEL (CC13)
ALBUMIN: 3.9 g/dL (ref 3.5–5.0)
ALT: 56 U/L — ABNORMAL HIGH (ref 0–55)
AST: 26 U/L (ref 5–34)
Alkaline Phosphatase: 104 U/L (ref 40–150)
Anion Gap: 9 mEq/L (ref 3–11)
BUN: 15.9 mg/dL (ref 7.0–26.0)
CO2: 22 mEq/L (ref 22–29)
CREATININE: 0.8 mg/dL (ref 0.6–1.1)
Calcium: 9.9 mg/dL (ref 8.4–10.4)
Chloride: 108 mEq/L (ref 98–109)
Glucose: 174 mg/dl — ABNORMAL HIGH (ref 70–140)
POTASSIUM: 4.5 meq/L (ref 3.5–5.1)
Sodium: 139 mEq/L (ref 136–145)
Total Bilirubin: 0.5 mg/dL (ref 0.20–1.20)
Total Protein: 6.9 g/dL (ref 6.4–8.3)

## 2014-08-27 MED ORDER — FAMOTIDINE IN NACL 20-0.9 MG/50ML-% IV SOLN
20.0000 mg | Freq: Once | INTRAVENOUS | Status: AC
  Administered 2014-08-27: 20 mg via INTRAVENOUS

## 2014-08-27 MED ORDER — ONDANSETRON 8 MG/50ML IVPB (CHCC)
8.0000 mg | Freq: Once | INTRAVENOUS | Status: AC
  Administered 2014-08-27: 8 mg via INTRAVENOUS

## 2014-08-27 MED ORDER — FAMOTIDINE IN NACL 20-0.9 MG/50ML-% IV SOLN
INTRAVENOUS | Status: AC
  Filled 2014-08-27: qty 50

## 2014-08-27 MED ORDER — DIPHENHYDRAMINE HCL 50 MG/ML IJ SOLN
INTRAMUSCULAR | Status: AC
  Filled 2014-08-27: qty 1

## 2014-08-27 MED ORDER — ONDANSETRON 8 MG/NS 50 ML IVPB
INTRAVENOUS | Status: AC
  Filled 2014-08-27: qty 8

## 2014-08-27 MED ORDER — DIPHENHYDRAMINE HCL 50 MG/ML IJ SOLN
50.0000 mg | Freq: Once | INTRAMUSCULAR | Status: AC
  Administered 2014-08-27: 50 mg via INTRAVENOUS

## 2014-08-27 MED ORDER — DEXAMETHASONE SODIUM PHOSPHATE 20 MG/5ML IJ SOLN
INTRAMUSCULAR | Status: AC
  Filled 2014-08-27: qty 5

## 2014-08-27 MED ORDER — DEXTROSE 5 % IV SOLN
80.0000 mg/m2 | Freq: Once | INTRAVENOUS | Status: AC
  Administered 2014-08-27: 150 mg via INTRAVENOUS
  Filled 2014-08-27: qty 25

## 2014-08-27 MED ORDER — SODIUM CHLORIDE 0.9 % IV SOLN
Freq: Once | INTRAVENOUS | Status: AC
  Administered 2014-08-27: 13:00:00 via INTRAVENOUS

## 2014-08-27 MED ORDER — DEXAMETHASONE SODIUM PHOSPHATE 20 MG/5ML IJ SOLN
20.0000 mg | Freq: Once | INTRAMUSCULAR | Status: AC
  Administered 2014-08-27: 20 mg via INTRAVENOUS

## 2014-08-27 NOTE — Patient Instructions (Signed)
[facility] Center Discharge Instructions for Patients Receiving Chemotherapy  Today you received the following chemotherapy agents: Taxol.  To help prevent nausea and vomiting after your treatment, we encourage you to take your nausea medication as prescribed.   If you develop nausea and vomiting that is not controlled by your nausea medication, call the clinic.   BELOW ARE SYMPTOMS THAT SHOULD BE REPORTED IMMEDIATELY:  *FEVER GREATER THAN 100.5 F  *CHILLS WITH OR WITHOUT FEVER  NAUSEA AND VOMITING THAT IS NOT CONTROLLED WITH YOUR NAUSEA MEDICATION  *UNUSUAL SHORTNESS OF BREATH  *UNUSUAL BRUISING OR BLEEDING  TENDERNESS IN MOUTH AND THROAT WITH OR WITHOUT PRESENCE OF ULCERS  *URINARY PROBLEMS  *BOWEL PROBLEMS  UNUSUAL RASH Items with * indicate a potential emergency and should be followed up as soon as possible.  Feel free to call the clinic you have any questions or concerns. The clinic phone number is [phone].    

## 2014-08-27 NOTE — Progress Notes (Signed)
OFFICE PROGRESS NOTE   08/27/2014   Physicians:David Cristy Hilts, Karrar Husein, Arvella Nigh, Roni Bread Lomax   INTERVAL HISTORY:  Patient is seen, together with sign language interpreter, as she continues adjuvant chemotherapy for triple negative left breast cancer; she is due cycle 10 of 12 planned weekly taxol treatments today. She is for local radiation after chemo completes.  Peripheral neuropathy in distal fingers and feet is no worse since most recent treatment. Very fine motor activities are difficult and fingers are painful if she hits them. She felt that increase in gabapentin to 300 mg at hs bothered her stomach and did not improve the neuropathy, so she stopped that after a few doses. She understands that the taxol can be discontinued prior to cycle 12 if neuropathy progresses to level that is not tolerable, tho we are trying to treat short of that due to this aggressive breast cancer.  The surgical wound from previous PAC continues to improve, patient managing dressing changes herself, HH still visiting intermittently. She has had no fever or symptoms of infection.  Peripheral IV access for blood draws and chemo is being carefully accomplished.  She does not have central catheter now. She had flu vaccine. BRCA testing had 3 variants of uncertain significance 01-2014.   ONCOLOGIC HISTORY RIGHT BREAST CANCER: Right breast cancer diagnosed January 2002 when patient was age 58 and premenopausal, T2N0M0 (4cm grade 3 invasive ductal carcinoma, <25% high grade DCIS, S phase 8.6%), ER negative, PR negative, HER 2 positive. She had lumpectomy with 2 negative sentinel nodes, adjuvant chemotherapy by Dr Starr Sinclair with adriamycin cytoxan x 4 cycles from March 2002 thru May 2002 (total adriamycin dose 409 mg with weight 142, height 5'7", for total adriamycin 235 mg/m2), and local radiation (50.4 Gy with boost to 60.4) Gy Note she declined PAC and received the chemotherapy  in 2002 by peripheral vein. I followed her beginning Sept 2003 until last previous visit 07-2011. She declined TEACH trial with lapatinib in 2007 and 2008. Last breast MRI previously was 08-2011.  Patient had infertility treatments at [facility] in 2005, daughter born 02-01-2005. Menses had been very infrequent after chemo until the infertility treatment, then stopped after that pregnancy.  LEFT BREAST CANCER: Mammograms at Dr Sherran Needs office early 12-2013 had area questioned on left compared with last prior mammograms of 08-2012. Diagnostic left mammogram and [place] at [facility] on 01-01-14 showed a mass in left upper inner breast posteriorly 1.5 x 1.4 x 1.3 cm on [place], with no abnormal left axillary nodes identified. She had biopsy on 01-01-14 with pathology ([id number]) showing grade 1-2 invasive ductal carcinoma ER, PR and HER 2 negative with Ki 66%. She had breast MRI 01-15-2014 with solitary enhancing mass upper inner left breast 1.7 x 2.0 x 1.8 cm, right breast not remarkable, no abnormal appearing lymph nodes. She had CT CAP 01-29-14 then MRI liver 02-05-14 with no evidence of metastatic disease. Genetics testing with Breast/Ovarian Cancer Gene Panel 01-2014 did not reveal a pathogenic mutation in these genes, tho she did have 3 variants of uncertain significance (ATM, [id number]; ATM, c.8674G>A and PMS2, C.1309C>T). She had consultation with Dr Tammi Klippel prior to definitive surgery. She had lumpectomy and left axillary sentinel node biopsy by Dr Lucia Gaskins on 03-06-2014. Pathology [id number]) found 1.8 cm grade II invasive ductal carcinoma with one negative sentinel node and final margins negative. She had PAC placed by Dr Lucia Gaskins 03-06-14/  Chemotherapy began with 2 cycles of dose dense AC on 03-30-14 and  04-13-14, then one treatment of cytoxan taxotere on 05-07-50 complicated by severe rash and hand foot syndrome. She began weekly taxol on 05-21-14, planned x 12 weeks. Expect local radiation after chemotherapy. PAC was removed  for infection 07-11-14.  Review of systems as above, also: No confusion or other mental status concern. Bowels ok, appetite good. No LE swelling. No SOB, no bleeding. Remainder of 10 point Review of Systems negative.  Objective:  Vital signs in last 24 hours:  BP 126/69  Pulse 90  Temp(Src) 98.3 F (36.8 C) (Oral)  Resp 16  Ht '5\' 7"'  (1.702 m)  Wt 160 lb (72.576 kg)  BMI 25.05 kg/m2  Weight down 1.5 lbs  Alert, oriented and appropriate. Ambulatory without difficulty.  Alopecia  HEENT:PERRL, sclerae not icteric. Oral mucosa moist without lesions, posterior pharynx clear.  Neck supple. No JVD.  Lymphatics:no cervical,supraclavicular, axillary adenopathy Resp: clear to auscultation bilaterally and normal percussion bilaterally Cardio: regular rate and rhythm. No gallop. Chest wall with surgical wound clean, decreasing in size, packed, no tenderness, no drainage GI: soft, nontender, not distended, no mass or organomegaly. Normally active bowel sounds.  Musculoskeletal/ Extremities: without pitting edema, cords, tenderness. No problems at most recent IV site/ blood draw sites UE Neuro: no peripheral neuropathy. Congenital deafness. PSYCH appropriate mood, affect, interactions. Skin without rash, ecchymosis, petechiae Breasts: Lumpectomy scar left well healed without changes, stable lumpectomy scar on right. No dominant masses or other findings of concern bilaterally. Axillae benign.   Lab Results:  Results for orders placed in visit on 08/27/14  CBC WITH DIFFERENTIAL      Result Value Ref Range   WBC 2.5 (*) 3.9 - 10.3 10e3/uL   NEUT# 2.2  1.5 - 6.5 10e3/uL   HGB 12.9  11.6 - 15.9 g/dL   HCT 38.8  34.8 - 46.6 %   Platelets 212  145 - 400 10e3/uL   MCV 93.8  79.5 - 101.0 fL   MCH 31.2  25.1 - 34.0 pg   MCHC 33.2  31.5 - 36.0 g/dL   RBC 4.13  3.70 - 5.45 10e6/uL   RDW 13.9  11.2 - 14.5 %   lymph# 0.3 (*) 0.9 - 3.3 10e3/uL   MONO# 0.0 (*) 0.1 - 0.9 10e3/uL   Eosinophils  Absolute 0.0  0.0 - 0.5 10e3/uL   Basophils Absolute 0.0  0.0 - 0.1 10e3/uL   NEUT% 87.3 (*) 38.4 - 76.8 %   LYMPH% 11.2 (*) 14.0 - 49.7 %   MONO% 1.1  0.0 - 14.0 %   EOS% 0.0  0.0 - 7.0 %   BASO% 0.4  0.0 - 2.0 %  COMPREHENSIVE METABOLIC PANEL (WU13)      Result Value Ref Range   Sodium 139  136 - 145 mEq/L   Potassium 4.5  3.5 - 5.1 mEq/L   Chloride 108  98 - 109 mEq/L   CO2 22  22 - 29 mEq/L   Glucose 174 (*) 70 - 140 mg/dl   BUN 15.9  7.0 - 26.0 mg/dL   Creatinine 0.8  0.6 - 1.1 mg/dL   Total Bilirubin 0.50  0.20 - 1.20 mg/dL   Alkaline Phosphatase 104  40 - 150 U/L   AST 26  5 - 34 U/L   ALT 56 (*) 0 - 55 U/L   Total Protein 6.9  6.4 - 8.3 g/dL   Albumin 3.9  3.5 - 5.0 g/dL   Calcium 9.9  8.4 - 10.4 mg/dL   Anion Gap  9  3 - 11 mEq/L     Studies/Results:  No results found.  Medications: I have reviewed the patient's current medications. Patient discontinued gabapentin.  DISCUSSION: peripheral neuropathy and taxol as above.  Assessment/Plan: 1.Triple negative left breast cancer T1cN0 diagnosed 12-2013, treated with lumpectomy and sentinel node evaluation 03-06-14. Adjuvant chemotherapy in process, with total 12 weekly taxol treatments planned (depending on neuropathy), then local radiation. She will let us know if worsening of neuropathy prior to cycle 11 on 10-26; I will see her prior to cycle 12 on 09-10-14. 2.PAC removed 08-03-14 for clinically obvious infection. Wound care continues, improving. 3.history T2N0M0 right breast cancer 11-2000 at age 38 treated with lumpectomy, 2 sentinel nodes, chemotherapy and local radiation. This was ER PR negative and HER 2 + (prior to herceptin use)  4.congenital deafness. Communicates by reading/ writing and sign language.  5.peripheral neuropathy from taxol. Following closely, no worse today. On B6 6.genetic variants of unknown significance on genetic testing 01-2014  7.Marland KitchenNOTE peripheral IV access needs to be watched closely due to  bilateral breast cancer interventions, but fortunately only sentinel nodes bilaterally. OK to use LUE for IV if needed (has not had RT on left yet). I expect we can carefully use peripheral veins to complete weekly taxol.     All questions answered. Laporscha fully understood discussion with excellent assistance of sign language interpreter. Chemo orders confirmed. Time spent 30 min including >50% counseling and coordination of care.    Gordy Levan, MD   08/27/2014, 12:37 PM

## 2014-08-28 ENCOUNTER — Ambulatory Visit (HOSPITAL_BASED_OUTPATIENT_CLINIC_OR_DEPARTMENT_OTHER): Payer: BC Managed Care – PPO

## 2014-08-28 VITALS — BP 136/80 | HR 64 | Temp 98.1°F

## 2014-08-28 DIAGNOSIS — Z5189 Encounter for other specified aftercare: Secondary | ICD-10-CM

## 2014-08-28 DIAGNOSIS — C50212 Malignant neoplasm of upper-inner quadrant of left female breast: Secondary | ICD-10-CM

## 2014-08-28 MED ORDER — TBO-FILGRASTIM 300 MCG/0.5ML ~~LOC~~ SOSY
300.0000 ug | PREFILLED_SYRINGE | Freq: Once | SUBCUTANEOUS | Status: AC
  Administered 2014-08-28: 300 ug via SUBCUTANEOUS
  Filled 2014-08-28: qty 0.5

## 2014-08-28 NOTE — Patient Instructions (Signed)

## 2014-09-03 ENCOUNTER — Other Ambulatory Visit (HOSPITAL_BASED_OUTPATIENT_CLINIC_OR_DEPARTMENT_OTHER): Payer: BC Managed Care – PPO

## 2014-09-03 ENCOUNTER — Ambulatory Visit (HOSPITAL_BASED_OUTPATIENT_CLINIC_OR_DEPARTMENT_OTHER): Payer: BC Managed Care – PPO

## 2014-09-03 VITALS — BP 122/72 | HR 83 | Temp 97.9°F | Resp 18 | Ht 67.0 in

## 2014-09-03 DIAGNOSIS — Z5111 Encounter for antineoplastic chemotherapy: Secondary | ICD-10-CM | POA: Diagnosis not present

## 2014-09-03 DIAGNOSIS — C50212 Malignant neoplasm of upper-inner quadrant of left female breast: Secondary | ICD-10-CM

## 2014-09-03 LAB — CBC WITH DIFFERENTIAL/PLATELET
BASO%: 0.3 % (ref 0.0–2.0)
Basophils Absolute: 0 10*3/uL (ref 0.0–0.1)
EOS%: 0 % (ref 0.0–7.0)
Eosinophils Absolute: 0 10*3/uL (ref 0.0–0.5)
HCT: 38.7 % (ref 34.8–46.6)
HGB: 12.9 g/dL (ref 11.6–15.9)
LYMPH%: 13.7 % — AB (ref 14.0–49.7)
MCH: 31.4 pg (ref 25.1–34.0)
MCHC: 33.4 g/dL (ref 31.5–36.0)
MCV: 93.9 fL (ref 79.5–101.0)
MONO#: 0 10*3/uL — ABNORMAL LOW (ref 0.1–0.9)
MONO%: 0.9 % (ref 0.0–14.0)
NEUT#: 2.2 10*3/uL (ref 1.5–6.5)
NEUT%: 85.1 % — ABNORMAL HIGH (ref 38.4–76.8)
PLATELETS: 212 10*3/uL (ref 145–400)
RBC: 4.13 10*6/uL (ref 3.70–5.45)
RDW: 14.4 % (ref 11.2–14.5)
WBC: 2.6 10*3/uL — ABNORMAL LOW (ref 3.9–10.3)
lymph#: 0.4 10*3/uL — ABNORMAL LOW (ref 0.9–3.3)

## 2014-09-03 LAB — COMPREHENSIVE METABOLIC PANEL (CC13)
ALT: 51 U/L (ref 0–55)
ANION GAP: 10 meq/L (ref 3–11)
AST: 25 U/L (ref 5–34)
Albumin: 3.9 g/dL (ref 3.5–5.0)
Alkaline Phosphatase: 95 U/L (ref 40–150)
BILIRUBIN TOTAL: 0.59 mg/dL (ref 0.20–1.20)
BUN: 12.4 mg/dL (ref 7.0–26.0)
CO2: 22 mEq/L (ref 22–29)
Calcium: 10 mg/dL (ref 8.4–10.4)
Chloride: 110 mEq/L — ABNORMAL HIGH (ref 98–109)
Creatinine: 0.7 mg/dL (ref 0.6–1.1)
Glucose: 151 mg/dl — ABNORMAL HIGH (ref 70–140)
Potassium: 4.3 mEq/L (ref 3.5–5.1)
Sodium: 141 mEq/L (ref 136–145)
Total Protein: 6.9 g/dL (ref 6.4–8.3)

## 2014-09-03 MED ORDER — SODIUM CHLORIDE 0.9 % IV SOLN
Freq: Once | INTRAVENOUS | Status: AC
  Administered 2014-09-03: 09:00:00 via INTRAVENOUS

## 2014-09-03 MED ORDER — FAMOTIDINE IN NACL 20-0.9 MG/50ML-% IV SOLN
INTRAVENOUS | Status: AC
  Filled 2014-09-03: qty 50

## 2014-09-03 MED ORDER — FAMOTIDINE IN NACL 20-0.9 MG/50ML-% IV SOLN
20.0000 mg | Freq: Once | INTRAVENOUS | Status: AC
  Administered 2014-09-03: 20 mg via INTRAVENOUS

## 2014-09-03 MED ORDER — DIPHENHYDRAMINE HCL 50 MG/ML IJ SOLN
INTRAMUSCULAR | Status: AC
  Filled 2014-09-03: qty 1

## 2014-09-03 MED ORDER — ONDANSETRON 8 MG/50ML IVPB (CHCC)
8.0000 mg | Freq: Once | INTRAVENOUS | Status: AC
  Administered 2014-09-03: 8 mg via INTRAVENOUS

## 2014-09-03 MED ORDER — ONDANSETRON 8 MG/NS 50 ML IVPB
INTRAVENOUS | Status: AC
  Filled 2014-09-03: qty 8

## 2014-09-03 MED ORDER — DIPHENHYDRAMINE HCL 50 MG/ML IJ SOLN
50.0000 mg | Freq: Once | INTRAMUSCULAR | Status: AC
  Administered 2014-09-03: 50 mg via INTRAVENOUS

## 2014-09-03 MED ORDER — DEXAMETHASONE SODIUM PHOSPHATE 20 MG/5ML IJ SOLN
20.0000 mg | Freq: Once | INTRAMUSCULAR | Status: AC
  Administered 2014-09-03: 20 mg via INTRAVENOUS

## 2014-09-03 MED ORDER — DEXAMETHASONE SODIUM PHOSPHATE 20 MG/5ML IJ SOLN
INTRAMUSCULAR | Status: AC
  Filled 2014-09-03: qty 5

## 2014-09-03 MED ORDER — PACLITAXEL CHEMO INJECTION 300 MG/50ML
80.0000 mg/m2 | Freq: Once | INTRAVENOUS | Status: AC
  Administered 2014-09-03: 150 mg via INTRAVENOUS
  Filled 2014-09-03: qty 25

## 2014-09-03 NOTE — Patient Instructions (Signed)
[facility] Discharge Instructions for Patients Receiving Chemotherapy  Today you received the following chemotherapy agents Taxol  To help prevent nausea and vomiting after your treatment, we encourage you to take your nausea medication    If you develop nausea and vomiting that is not controlled by your nausea medication, call the clinic.   BELOW ARE SYMPTOMS THAT SHOULD BE REPORTED IMMEDIATELY:  *FEVER GREATER THAN 100.5 F  *CHILLS WITH OR WITHOUT FEVER  NAUSEA AND VOMITING THAT IS NOT CONTROLLED WITH YOUR NAUSEA MEDICATION  *UNUSUAL SHORTNESS OF BREATH  *UNUSUAL BRUISING OR BLEEDING  TENDERNESS IN MOUTH AND THROAT WITH OR WITHOUT PRESENCE OF ULCERS  *URINARY PROBLEMS  *BOWEL PROBLEMS  UNUSUAL RASH Items with * indicate a potential emergency and should be followed up as soon as possible.  Feel free to call the clinic you have any questions or concerns. The clinic phone number is [phone].    

## 2014-09-04 ENCOUNTER — Ambulatory Visit (HOSPITAL_BASED_OUTPATIENT_CLINIC_OR_DEPARTMENT_OTHER): Payer: BC Managed Care – PPO

## 2014-09-04 DIAGNOSIS — Z5189 Encounter for other specified aftercare: Secondary | ICD-10-CM

## 2014-09-04 DIAGNOSIS — C50212 Malignant neoplasm of upper-inner quadrant of left female breast: Secondary | ICD-10-CM

## 2014-09-04 MED ORDER — TBO-FILGRASTIM 300 MCG/0.5ML ~~LOC~~ SOSY
300.0000 ug | PREFILLED_SYRINGE | Freq: Once | SUBCUTANEOUS | Status: AC
Start: 2014-09-04 — End: 2014-09-04
  Administered 2014-09-04: 300 ug via SUBCUTANEOUS
  Filled 2014-09-04: qty 0.5

## 2014-09-04 NOTE — Patient Instructions (Signed)

## 2014-09-07 ENCOUNTER — Other Ambulatory Visit: Payer: Self-pay

## 2014-09-07 DIAGNOSIS — C50212 Malignant neoplasm of upper-inner quadrant of left female breast: Secondary | ICD-10-CM

## 2014-09-08 ENCOUNTER — Other Ambulatory Visit: Payer: Self-pay | Admitting: Oncology

## 2014-09-10 ENCOUNTER — Ambulatory Visit (HOSPITAL_BASED_OUTPATIENT_CLINIC_OR_DEPARTMENT_OTHER): Payer: BC Managed Care – PPO

## 2014-09-10 ENCOUNTER — Encounter: Payer: Self-pay | Admitting: Oncology

## 2014-09-10 ENCOUNTER — Other Ambulatory Visit (HOSPITAL_BASED_OUTPATIENT_CLINIC_OR_DEPARTMENT_OTHER): Payer: BC Managed Care – PPO

## 2014-09-10 ENCOUNTER — Telehealth: Payer: Self-pay | Admitting: Oncology

## 2014-09-10 ENCOUNTER — Ambulatory Visit (HOSPITAL_BASED_OUTPATIENT_CLINIC_OR_DEPARTMENT_OTHER): Payer: BC Managed Care – PPO | Admitting: Oncology

## 2014-09-10 ENCOUNTER — Ambulatory Visit: Payer: BC Managed Care – PPO | Admitting: Oncology

## 2014-09-10 ENCOUNTER — Other Ambulatory Visit: Payer: BC Managed Care – PPO

## 2014-09-10 VITALS — BP 130/74 | HR 102 | Temp 98.5°F | Resp 18 | Ht 67.0 in | Wt 157.7 lb

## 2014-09-10 DIAGNOSIS — Z171 Estrogen receptor negative status [ER-]: Secondary | ICD-10-CM

## 2014-09-10 DIAGNOSIS — T451X5A Adverse effect of antineoplastic and immunosuppressive drugs, initial encounter: Secondary | ICD-10-CM

## 2014-09-10 DIAGNOSIS — C50212 Malignant neoplasm of upper-inner quadrant of left female breast: Secondary | ICD-10-CM

## 2014-09-10 DIAGNOSIS — Z5111 Encounter for antineoplastic chemotherapy: Secondary | ICD-10-CM | POA: Diagnosis not present

## 2014-09-10 DIAGNOSIS — G620 Drug-induced polyneuropathy: Secondary | ICD-10-CM | POA: Diagnosis not present

## 2014-09-10 LAB — COMPREHENSIVE METABOLIC PANEL (CC13)
ALT: 45 U/L (ref 0–55)
ANION GAP: 9 meq/L (ref 3–11)
AST: 22 U/L (ref 5–34)
Albumin: 4 g/dL (ref 3.5–5.0)
Alkaline Phosphatase: 98 U/L (ref 40–150)
BILIRUBIN TOTAL: 0.52 mg/dL (ref 0.20–1.20)
BUN: 13.6 mg/dL (ref 7.0–26.0)
CALCIUM: 9.8 mg/dL (ref 8.4–10.4)
CO2: 20 meq/L — AB (ref 22–29)
CREATININE: 0.8 mg/dL (ref 0.6–1.1)
Chloride: 110 mEq/L — ABNORMAL HIGH (ref 98–109)
Glucose: 212 mg/dl — ABNORMAL HIGH (ref 70–140)
Potassium: 4.6 mEq/L (ref 3.5–5.1)
Sodium: 139 mEq/L (ref 136–145)
Total Protein: 6.9 g/dL (ref 6.4–8.3)

## 2014-09-10 LAB — CBC WITH DIFFERENTIAL/PLATELET
BASO%: 0 % (ref 0.0–2.0)
BASOS ABS: 0 10*3/uL (ref 0.0–0.1)
EOS%: 0 % (ref 0.0–7.0)
Eosinophils Absolute: 0 10*3/uL (ref 0.0–0.5)
HEMATOCRIT: 37.9 % (ref 34.8–46.6)
HEMOGLOBIN: 13 g/dL (ref 11.6–15.9)
LYMPH%: 11.6 % — ABNORMAL LOW (ref 14.0–49.7)
MCH: 31.4 pg (ref 25.1–34.0)
MCHC: 34.3 g/dL (ref 31.5–36.0)
MCV: 91.5 fL (ref 79.5–101.0)
MONO#: 0 10*3/uL — AB (ref 0.1–0.9)
MONO%: 0.3 % (ref 0.0–14.0)
NEUT#: 2.6 10*3/uL (ref 1.5–6.5)
NEUT%: 88.1 % — AB (ref 38.4–76.8)
Platelets: 166 10*3/uL (ref 145–400)
RBC: 4.14 10*6/uL (ref 3.70–5.45)
RDW: 13.8 % (ref 11.2–14.5)
WBC: 2.9 10*3/uL — AB (ref 3.9–10.3)
lymph#: 0.3 10*3/uL — ABNORMAL LOW (ref 0.9–3.3)

## 2014-09-10 MED ORDER — SODIUM CHLORIDE 0.9 % IV SOLN
Freq: Once | INTRAVENOUS | Status: AC
  Administered 2014-09-10: 10:00:00 via INTRAVENOUS

## 2014-09-10 MED ORDER — FAMOTIDINE IN NACL 20-0.9 MG/50ML-% IV SOLN
INTRAVENOUS | Status: AC
  Filled 2014-09-10: qty 50

## 2014-09-10 MED ORDER — DEXTROSE 5 % IV SOLN
80.0000 mg/m2 | Freq: Once | INTRAVENOUS | Status: AC
  Administered 2014-09-10: 150 mg via INTRAVENOUS
  Filled 2014-09-10: qty 25

## 2014-09-10 MED ORDER — DEXAMETHASONE SODIUM PHOSPHATE 20 MG/5ML IJ SOLN
INTRAMUSCULAR | Status: AC
Start: 2014-09-10 — End: 2014-09-10
  Filled 2014-09-10: qty 5

## 2014-09-10 MED ORDER — ONDANSETRON 8 MG/NS 50 ML IVPB
INTRAVENOUS | Status: AC
  Filled 2014-09-10: qty 8

## 2014-09-10 MED ORDER — ONDANSETRON 8 MG/50ML IVPB (CHCC)
8.0000 mg | Freq: Once | INTRAVENOUS | Status: AC
  Administered 2014-09-10: 8 mg via INTRAVENOUS

## 2014-09-10 MED ORDER — DIPHENHYDRAMINE HCL 50 MG/ML IJ SOLN
50.0000 mg | Freq: Once | INTRAMUSCULAR | Status: AC
  Administered 2014-09-10: 50 mg via INTRAVENOUS

## 2014-09-10 MED ORDER — DIPHENHYDRAMINE HCL 50 MG/ML IJ SOLN
INTRAMUSCULAR | Status: AC
Start: 2014-09-10 — End: 2014-09-10
  Filled 2014-09-10: qty 1

## 2014-09-10 MED ORDER — DEXAMETHASONE SODIUM PHOSPHATE 20 MG/5ML IJ SOLN
20.0000 mg | Freq: Once | INTRAMUSCULAR | Status: AC
  Administered 2014-09-10: 20 mg via INTRAVENOUS

## 2014-09-10 MED ORDER — FAMOTIDINE IN NACL 20-0.9 MG/50ML-% IV SOLN
20.0000 mg | Freq: Once | INTRAVENOUS | Status: AC
  Administered 2014-09-10: 20 mg via INTRAVENOUS

## 2014-09-10 NOTE — Progress Notes (Signed)
OFFICE PROGRESS NOTE   09/10/2014   Physicians:David Cristy Hilts, Patsy Lager, Roni Bread Lomax  INTERVAL HISTORY:  Patient is seen, together with husband and sign language interpreter, due last planned adjuvant chemotherapy today for triple negative left breast cancer. Radiation by Dr Tammi Klippel is planned after chemotherapy.  She denies any worsening of peripheral neuropathy in fingers and distal feet with treatments last 2 weeks, symptoms persistent and somewhat bothersome with fine motor activities of fingers as has been the case, not causing difficulty walking. She and husband understand risks and benefits of completing the low dose taxol and wish to proceed. The wound from [facility] removal continues to improve, HH no longer involved and Ciin doing packing 1-2x daily as well as washing in shower once daily. She denies drainage, pain, fever. Peripheral IV access has been accomplished in left forearm and hand for these last taxol administrations. She is otherwise feeling well, with good appetite and good energy. She denies SOB, abdominal discomfort, swelling in LE, any bleeding.    ONCOLOGIC HISTORY RIGHT BREAST CANCER: Right breast cancer diagnosed January 2002 when patient was age 48 and premenopausal, T2N0M0 (4cm grade 3 invasive ductal carcinoma, <25% high grade DCIS, S phase 8.6%), ER negative, PR negative, HER 2 positive. She had lumpectomy with 2 negative sentinel nodes, adjuvant chemotherapy by Dr Starr Sinclair with adriamycin cytoxan x 4 cycles from March 2002 thru May 2002 (total adriamycin dose 409 mg with weight 142, height 5'7", for total adriamycin 235 mg/m2), and local radiation (50.4 Gy with boost to 60.4) Gy Note she declined PAC and received the chemotherapy in 2002 by peripheral vein. I followed her beginning Sept 2003 until last previous visit 07-2011. She declined TEACH trial with lapatinib in 2007 and 2008. Last breast MRI previously was 08-2011.   Patient had infertility treatments at [facility] in 2005, daughter born 02-01-2005. Menses had been very infrequent after chemo until the infertility treatment, then stopped after that pregnancy.  LEFT BREAST CANCER: Mammograms at Dr Sherran Needs office early 12-2013 had area questioned on left compared with last prior mammograms of 08-2012. Diagnostic left mammogram and [place] at [facility] on 01-01-14 showed a mass in left upper inner breast posteriorly 1.5 x 1.4 x 1.3 cm on [place], with no abnormal left axillary nodes identified. She had biopsy on 01-01-14 with pathology ([id number]) showing grade 1-2 invasive ductal carcinoma ER, PR and HER 2 negative with Ki 66%. She had breast MRI 01-15-2014 with solitary enhancing mass upper inner left breast 1.7 x 2.0 x 1.8 cm, right breast not remarkable, no abnormal appearing lymph nodes. She had CT CAP 01-29-14 then MRI liver 02-05-14 with no evidence of metastatic disease. Genetics testing with Breast/Ovarian Cancer Gene Panel 01-2014 did not reveal a pathogenic mutation in these genes, tho she did have 3 variants of uncertain significance (ATM, [id number]; ATM, c.8674G>A and PMS2, C.1309C>T). She had consultation with Dr Tammi Klippel prior to definitive surgery. She had lumpectomy and left axillary sentinel node biopsy by Dr Lucia Gaskins on 03-06-2014. Pathology [id number]) found 1.8 cm grade II invasive ductal carcinoma with one negative sentinel node and final margins negative. She had PAC placed by Dr Lucia Gaskins 03-06-14.Chemotherapy began with 2 cycles of dose dense AC on 03-30-14 and 04-13-14, which was maximal adriamycin based on previous chemo, then one treatment of cytoxan taxotere on 4-43-15 complicated by severe rash and hand foot syndrome. The second planned cycle of cytoxan taxotere was not given. She began weekly taxol on 05-21-14, total 12  cycles completed 09-10-14 after weeks 4 and 7 delayed due to low counts, and week 8 delayed due to hospitalizations/ PAC infection  . Expect local  radiation after chemotherapy. PAC was removed for infection 07-11-14.  Review of systems as above, also:Marland Kitchen  Chronic problems sleeping since she used to work second shift, at most ~ 6 hrs/ night, which she seems to tolerate now.  Remainder of 10 point Review of Systems negative.  Objective:  Vital signs in last 24 hours:  BP 130/74 mmHg  Pulse 102  Temp(Src) 98.5 F (36.9 C) (Oral)  Resp 18  Ht _0  (1.702 m)  Wt 157 lb 11.2 oz (71.532 kg)  BMI 24.69 kg/m2 weight down 2 lbs  Alert, oriented and appropriate, looks comfortable. Communicates easily signing. Ambulatory without difficulty, easily able to get on and off exam table.  Hair is beginning to grow back  HEENT:PERRL, sclerae not icteric. Oral mucosa moist without lesions, posterior pharynx clear.  Neck supple. No JVD.  Lymphatics:no cervical,suraclavicular, axillary adenopathy Resp: clear to auscultation bilaterally and normal percussion bilaterally Cardio: regular rate and rhythm. No gallop. GI: soft, nontender, not distended, no mass or organomegaly. Normally active bowel sounds.  Musculoskeletal/ Extremities: without pitting edema, cords, tenderness. Slight discoloration along one vein left forearm without cord or tenderness, no findings at sites of other IV access there, no swelling LUE. Neuro: peripheral neuropathy as noted and congenital deafness. Otherwise nonfocal. PSYCH appropriate mood and affect Skin without rash, ecchymosis, petechiae  Axillae benign. Site of previous portacath right anterior chest wall clean, no tenderness, diameter smaller at ~ 1.5 cm, edges of wound pink without drainage.  Lab Results:  Results for orders placed or performed in visit on 09/10/14  CBC with Differential  Result Value Ref Range   WBC 2.9 (L) 3.9 - 10.3 10e3/uL   NEUT# 2.6 1.5 - 6.5 10e3/uL   HGB 13.0 11.6 - 15.9 g/dL   HCT 37.9 34.8 - 46.6 %   Platelets 166 145 - 400 10e3/uL   MCV 91.5 79.5 - 101.0 fL   MCH 31.4 25.1 - 34.0  pg   MCHC 34.3 31.5 - 36.0 g/dL   RBC 4.14 3.70 - 5.45 10e6/uL   RDW 13.8 11.2 - 14.5 %   lymph# 0.3 (L) 0.9 - 3.3 10e3/uL   MONO# 0.0 (L) 0.1 - 0.9 10e3/uL   Eosinophils Absolute 0.0 0.0 - 0.5 10e3/uL   Basophils Absolute 0.0 0.0 - 0.1 10e3/uL   NEUT% 88.1 (H) 38.4 - 76.8 %   LYMPH% 11.6 (L) 14.0 - 49.7 %   MONO% 0.3 0.0 - 14.0 %   EOS% 0.0 0.0 - 7.0 %   BASO% 0.0 0.0 - 2.0 %  Comprehensive metabolic panel (Cmet) - [facility]  Result Value Ref Range   Sodium 139 136 - 145 mEq/L   Potassium 4.6 3.5 - 5.1 mEq/L   Chloride 110 (H) 98 - 109 mEq/L   CO2 20 (L) 22 - 29 mEq/L   Glucose 212 (H) 70 - 140 mg/dl   BUN 13.6 7.0 - 26.0 mg/dL   Creatinine 0.8 0.6 - 1.1 mg/dL   Total Bilirubin 0.52 0.20 - 1.20 mg/dL   Alkaline Phosphatase 98 40 - 150 U/L   AST 22 5 - 34 U/L   ALT 45 0 - 55 U/L   Total Protein 6.9 6.4 - 8.3 g/dL   Albumin 4.0 3.5 - 5.0 g/dL   Calcium 9.8 8.4 - 10.4 mg/dL   Anion Gap 9 3 -  11 mEq/L     Studies/Results:  No results found.  Medications: I have reviewed the patient's current medications. She has begun B6 100 mg tablets just once daily, will increase to 100 mg bid.  DISCUSSION: I spoke directly with Dr Johny Shears RN and with radiation oncology scheduling re need for sign language interpreter with each visit, also requested they set up appointment at that office while she is here today, so that it can be clearly communicated to her.  Discussed follow up at this office after chemo completes.  Assessment/Plan:  1.Triple negative left breast cancer T1cN0 diagnosed 12-2013, treated with lumpectomy and sentinel node evaluation 03-06-14. Adjuvant chemotherapy in process, with last weekly taxol treatment today, then local radiation. Neupogen day after each taxol. I will see her with Rx on 10-19. 2.PAC removed 08-03-14 for clinically obvious infection. Wound care continuing, wound improving. 3.history T2N0M0 right breast cancer 11-2000 at age 89 treated with lumpectomy, 2  sentinel nodes, chemotherapy and local radiation. This was ER PR negative and HER 2 + (prior to herceptin use)  4.peripheral neuropathy related to taxanes: no worse with most recent weekly taxol and patient understands that this may not resolve entirely, tho hopefully will improve after chemo; she does agree to treatment today given concerns about recurrence risk with this breast cancer. She will increase B6 to 100 mg bid. 5.congenital deafness. Communicates by reading/ writing and sign language.   6.genetic variants of unknown significance on genetic testing 01-2014  7. NOTE peripheral IV access needs to be watched closely due to bilateral breast cancer interventions, but fortunately only sentinel nodes bilaterally. OK to use LUE for IV if needed (has not had RT on left yet). I expect we can carefully use peripheral veins to complete weekly taxol.    All questions answered. Patient and husband fully understood discussion with excellent assistance from sign language interpreter. I will see her back in 3-4 weeks or sooner if needed, recheck counts then. Chemo and gCSF orders confirmed. Time spent 30 min including >50% counseling and coordination of care  Satomi Buda P, MD   09/10/2014, 9:00 AM

## 2014-09-10 NOTE — Progress Notes (Signed)
Pt saw Dr. Marko Plume today prior to chemo.  OK to proceed as planned with all lab results today per md.

## 2014-09-10 NOTE — Patient Instructions (Signed)
[facility] Discharge Instructions for Patients Receiving Chemotherapy  Today you received the following chemotherapy agents: Taxol  To help prevent nausea and vomiting after your treatment, we encourage you to take your nausea medication as prescribed by your physician.  If you develop nausea and vomiting that is not controlled by your nausea medication, call the clinic.   BELOW ARE SYMPTOMS THAT SHOULD BE REPORTED IMMEDIATELY:  *FEVER GREATER THAN 100.5 F  *CHILLS WITH OR WITHOUT FEVER  NAUSEA AND VOMITING THAT IS NOT CONTROLLED WITH YOUR NAUSEA MEDICATION  *UNUSUAL SHORTNESS OF BREATH  *UNUSUAL BRUISING OR BLEEDING  TENDERNESS IN MOUTH AND THROAT WITH OR WITHOUT PRESENCE OF ULCERS  *URINARY PROBLEMS  *BOWEL PROBLEMS  UNUSUAL RASH Items with * indicate a potential emergency and should be followed up as soon as possible.  Feel free to call the clinic you have any questions or concerns. The clinic phone number is [phone].    

## 2014-09-11 ENCOUNTER — Ambulatory Visit (HOSPITAL_BASED_OUTPATIENT_CLINIC_OR_DEPARTMENT_OTHER): Payer: BC Managed Care – PPO

## 2014-09-11 DIAGNOSIS — C50212 Malignant neoplasm of upper-inner quadrant of left female breast: Secondary | ICD-10-CM

## 2014-09-11 MED ORDER — TBO-FILGRASTIM 300 MCG/0.5ML ~~LOC~~ SOSY
300.0000 ug | PREFILLED_SYRINGE | Freq: Once | SUBCUTANEOUS | Status: AC
  Administered 2014-09-11: 300 ug via SUBCUTANEOUS
  Filled 2014-09-11: qty 0.5

## 2014-09-11 NOTE — Patient Instructions (Signed)

## 2014-09-21 ENCOUNTER — Telehealth: Payer: Self-pay | Admitting: Oncology

## 2014-09-21 NOTE — Telephone Encounter (Signed)
schedule change - moved 12/2 appts to 12/3 - call pt re change and received no answer. a message was left/sent to interperter [phone]. schedule also mailed and comment added to 11/18 radon appt to send pt to [facility] scheduling for new schedule.

## 2014-09-24 ENCOUNTER — Ambulatory Visit: Payer: BC Managed Care – PPO | Admitting: Radiation Oncology

## 2014-09-24 ENCOUNTER — Ambulatory Visit: Payer: BC Managed Care – PPO

## 2014-09-24 ENCOUNTER — Encounter: Payer: Self-pay | Admitting: Radiation Oncology

## 2014-09-24 NOTE — Progress Notes (Signed)
Location of Breast Cancer: left upper inner breast posteriorly  Histology per Pathology Report:    Receptor Status: ER(-), PR (-), Her2-neu (-)  Did patient present with symptoms (if so, please note symptoms) or was this found on screening mammography?: found on screening mammogram  Past/Anticipated interventions by surgeon, if any:She had lumpectomy with 2 negative sentinel nodes in 2002. She had lumpectomy and left axillary sentinel node biopsy by Dr Lucia Gaskins on 03-06-2014.   Past/Anticipated interventions by medical oncology, if any: adjuvant chemotherapy by Dr Starr Sinclair with adriamycin cytoxan x 4 cycles from March 2002 thru May 2002 (total adriamycin dose 409 mg with weight 142, height 5'7", for total adriamycin 235 mg/m2), and local radiation (50.4 Gy with boost to 60.4) Gy Note she declined PAC and received the chemotherapy in 2002 by peripheral vein. I followed her beginning Sept 2003 until last previous visit 07-2011. She declined TEACH trial with lapatinib in 2007 and 2008. Last breast MRI previously was 08-2011.   Then, Chemotherapy began with 2 cycles of dose dense AC on 03-30-14 and 04-13-14, which was maximal adriamycin based on previous chemo, then one treatment of cytoxan taxotere on 6-75-61 complicated by severe rash and hand foot syndrome. The second planned cycle of cytoxan taxotere was not given. She began weekly taxol on 05-21-14, total 12 cycles completed 09-10-14 after weeks 4 and 7 delayed due to low counts, and week 8 delayed due to hospitalizations/ PAC infection .   Lymphedema issues, if any:  no   Pain issues, if any:  yes, peripheral neuropathy in finger and feet  SAFETY ISSUES:  Prior radiation? yes  Pacemaker/ICD? no  Possible current pregnancy?no  Is the patient on methotrexate? no  Current Complaints / other details:  53 year old female. Requires sign language interpreter.    09/24/2014,4:26 PM

## 2014-09-26 ENCOUNTER — Ambulatory Visit
Admission: RE | Admit: 2014-09-26 | Discharge: 2014-09-26 | Disposition: A | Discharge status: Home / Self Care | Payer: BLUE CROSS/BLUE SHIELD | Source: Ambulatory Visit | Attending: Radiation Oncology | Admitting: Radiation Oncology

## 2014-09-26 ENCOUNTER — Encounter: Payer: Self-pay | Admitting: Radiation Oncology

## 2014-09-26 VITALS — BP 118/77 | HR 86 | Resp 16 | Ht 67.0 in | Wt 157.4 lb

## 2014-09-26 DIAGNOSIS — Z853 Personal history of malignant neoplasm of breast: Secondary | ICD-10-CM | POA: Insufficient documentation

## 2014-09-26 DIAGNOSIS — Z171 Estrogen receptor negative status [ER-]: Secondary | ICD-10-CM | POA: Diagnosis not present

## 2014-09-26 DIAGNOSIS — C50212 Malignant neoplasm of upper-inner quadrant of left female breast: Secondary | ICD-10-CM | POA: Diagnosis not present

## 2014-09-26 DIAGNOSIS — Z510 Encounter for antineoplastic radiation therapy: Secondary | ICD-10-CM | POA: Insufficient documentation

## 2014-09-26 DIAGNOSIS — D0592 Unspecified type of carcinoma in situ of left breast: Secondary | ICD-10-CM

## 2014-09-26 DIAGNOSIS — Z9221 Personal history of antineoplastic chemotherapy: Secondary | ICD-10-CM | POA: Insufficient documentation

## 2014-09-26 NOTE — Progress Notes (Signed)
See progress note under physician encounter. 

## 2014-09-26 NOTE — Progress Notes (Signed)
Radiation Oncology         [phone] ________________________________  Name: Regina Watts MRN: [id number]  Date: 09/26/2014  DOB: 12-Mar-1961  Follow-Up Visit Note  CC: Wenda Low, MD  Gordy Levan, MD  Diagnosis:   53 year old woman with stage TIc (1.8 cm) N0M0 estrogen receptor negative invasive ductal carcinoma of the left breast-stage I    ICD-9-CM ICD-10-CM   1. Breast cancer of upper-inner quadrant of left female breast 174.2 C50.212     Narrative:  The patient returns today following completion of chemotherapy to coordinate radiation treatment to the left breast in a breast conserving approach. To review, I met with her in consultation following diagnosis of her left breast cancer prior to surgical resection back in March. At that time, we discussed that she had a previous right breast cancer. We also discussed that recent screening mammogram was performed on 12/14/13 at Dr. Ophelia Charter' office.  She was found to have a new small rounded density in the upper inner left breast.  On 2/23, she underwent compression spot views confirming the mass.  Ultrasound guided core needle biopsy showed invasive ductal carcinoma that was estrogen receptor negative.  Subsequent breast MRI shows that the abnormal left breast lesion is solitary and there are no abnormal appearing lymph nodes. The patient met with genetics counselors and was tested for BRCA dictations. She did not have any genetic predisposition to breast cancer. 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 However, given her previous history of right breast radiotherapy without exaggerated consultations, the ATM mutations are not felt to predispose her to a risk of increased radiation toxicity. Ultimately, she proceeded to left lumpectomy with sentinel lymph node sampling. The lumpectomy specimen contained a grade 2 invasive ductal carcinoma spanning 1.8 cm in greatest dimension. The tumor was initially extremely close to the posterior  margin. However, an additional posterior margin was obtained revealing no residual disease. One sentinel lymph node was sampled and it was free of metastatic involvement. Postoperatively, the patient has received systemic chemotherapy with Adriamycin and Cytoxan followed by Taxol. She tolerated chemotherapy relatively well and completed chemotherapy on November 2.                              ALLERGIES:  is allergic to taxotere.  Meds: Current Outpatient Prescriptions  Medication Sig Dispense Refill  . pyridOXINE (VITAMIN B-6) 50 MG tablet Take 50 mg by mouth 2 (two) times daily.    . sertraline (ZOLOFT) 100 MG tablet Take 1 tablet (100 mg total) by mouth daily. 30 tablet 3   No current facility-administered medications for this encounter.    Physical Findings: The patient is in no acute distress. Patient is alert and oriented.  height is _0  (1.702 m) and weight is 157 lb 6.4 oz (71.396 kg). Her blood pressure is 118/77 and her pulse is 86. Her respiration is 16. . The supra-clavicular region was free of lymphadenopathy. Bilateral axillae are free of adenopathy. Lungs are clear to auscultation throughout. Heart is regular. In a seated position, the right breast sits higher on the chest wall due to previous radiotherapy. The left breast is notable for a curvilinear incision in the upper inner quadrant which is well-healed. Palpation of the left breast in a seated and supine position reveals some deficiency of soft tissue at the lumpectomy site with no residual nodularity or dominant mass. The remaining breast tissue within the left breast contains  smooth fibroglandular tissue with no dominant nodules. The left axilla is free of adenopathy. Abdomen is soft and nontender. Extremities are free of cyanosis clubbing or edema. Neurologically, the patient appears to be grossly intact.. She is alert  No significant changes.  Lab Findings: Lab Results  Component Value Date   WBC 2.9* 09/10/2014   HGB  13.0 09/10/2014   HCT 37.9 09/10/2014   MCV 91.5 09/10/2014   PLT 166 09/10/2014   Impression:  The patient is a very nice 54 year old woman with stage I left breast cancer which appears amenable to breast conserving curative radiotherapy.  Plan:  Today, I talked to the patient using an sign language interpreter about the findings and work-up thus far.  We discussed the natural history of early stage breast cancer and general treatment, highlighting the role of radiotherapy in the management.  Given the patient's previous history of right breast radiotherapy, she was comfortable with the details related to radiation. We discussed the available radiation techniques, and focused on the details of logistics and delivery.  We reviewed the anticipated acute and late sequelae associated with radiation in this setting.  The patient was encouraged to ask questions that I answered to the best of my ability.  I filled out a patient counseling form during our discussion including treatment diagrams.  We retained a copy for our records.  The patient would like to proceed with radiation and will be scheduled for CT simulation.  I spent 60 minutes minutes face to face with the patient and more than 50% of that time was spent in counseling and/or coordination of care.   _____________________________________  Sheral Apley. Tammi Klippel, M.D.

## 2014-09-26 NOTE — Progress Notes (Signed)
Patient here for reconsult with Dr. Tammi Klippel for left breast ca. Patient accompanied by sign language interpretor. Reports occasional night sweats. Denies left breast pain or nipple discharge. Full ROM of both upper extremities less pain demonstrated. No edema of left arm noted. Reports biopsy incision has healed well. Reports neuropathy of hands and feet. Reports that she finished chemotherapy on November 2nd.   Prior hx of right breast radiation Denies having a pacemaker AX to taxotere.

## 2014-10-05 ENCOUNTER — Encounter: Payer: Self-pay | Admitting: Oncology

## 2014-10-05 ENCOUNTER — Other Ambulatory Visit: Payer: Self-pay | Admitting: Oncology

## 2014-10-05 DIAGNOSIS — C50212 Malignant neoplasm of upper-inner quadrant of left female breast: Secondary | ICD-10-CM

## 2014-10-05 NOTE — Progress Notes (Signed)
[place] END OF TREATMENT   Name: Regina Watts Date: 10/05/2014 MRN: [id number] DOB: 11-18-1960   TREATMENT DATES: 5-22 thru 04-13-14   REFERRING PHYSICIAN: D.Newman   DIAGNOSIS: triple negative left breast cancer   STAGE AT START OF TREATMENT: stage I   INTENT: curative   DRUGS OR REGIMENS GIVEN: adriamycin cytoxan x 2 cycles, for total adriamycin 120 mg/m2 (previously 235 mg/m2 with chemotherapy 2002)   MAJOR TOXICITIES: neutropenia    REASON TREATMENT STOPPED: cumulative adriamycin dose as noted   PERFORMANCE STATUS AT END: 1   ONGOING PROBLEMS: mild thrombocytopenia   FOLLOW UP PLANS: continue chemotherapy with cytoxan taxotere

## 2014-10-05 NOTE — Progress Notes (Signed)
[facility] END OF TREATMENT   Name: Regina Watts Date: 10/05/2014 MRN: [id number] DOB: May 27, 1961   TREATMENT DATES: 04-27-2014   REFERRING PHYSICIAN: D.Newman   DIAGNOSIS: triple negative left breast cancer   STAGE AT START OF TREATMENT: 1   INTENT: curative   DRUGS OR REGIMENS GIVEN: taxotere cytoxan   MAJOR TOXICITIES: severe skin reaction   REASON TREATMENT STOPPED: severe skin reaction   PERFORMANCE STATUS AT END: 1  ONGOING PROBLEMS: severe skin reaction, resolving   FOLLOW UP PLANS: continue chemo with weekly taxol

## 2014-10-05 NOTE — Progress Notes (Signed)
[facility] END OF TREATMENT   Name: VAUGHN FRIEZE Date: 10/05/2014 MRN: [id number] DOB: 1961/02/18   TREATMENT DATES: 05-21-2014 thru 09-10-2014   REFERRING PHYSICIAN: D.Newman   DIAGNOSIS: triple negative left breast cancer  STAGE AT START OF TREATMENT: 1   INTENT: curative   DRUGS OR REGIMENS GIVEN: weekly taxol x 12  MAJOR TOXICITIES: decreased WBC, peripheral neuropathy, PAC infection   REASON TREATMENT STOPPED: completion of planned course  PERFORMANCE STATUS AT END: 0-1   ONGOING PROBLEMS: peripheral neuropathy fingers and distal feet   FOLLOW UP PLANS: radiation to breast

## 2014-10-10 ENCOUNTER — Ambulatory Visit: Payer: BC Managed Care – PPO | Admitting: Oncology

## 2014-10-10 ENCOUNTER — Other Ambulatory Visit: Payer: BC Managed Care – PPO

## 2014-10-11 ENCOUNTER — Other Ambulatory Visit (HOSPITAL_BASED_OUTPATIENT_CLINIC_OR_DEPARTMENT_OTHER): Payer: BC Managed Care – PPO

## 2014-10-11 ENCOUNTER — Encounter: Payer: Self-pay | Admitting: Oncology

## 2014-10-11 ENCOUNTER — Ambulatory Visit (HOSPITAL_BASED_OUTPATIENT_CLINIC_OR_DEPARTMENT_OTHER): Payer: BC Managed Care – PPO | Admitting: Oncology

## 2014-10-11 ENCOUNTER — Telehealth: Payer: Self-pay | Admitting: Oncology

## 2014-10-11 VITALS — BP 115/77 | HR 78 | Temp 98.2°F | Resp 18 | Ht 67.0 in | Wt 155.7 lb

## 2014-10-11 DIAGNOSIS — G622 Polyneuropathy due to other toxic agents: Secondary | ICD-10-CM

## 2014-10-11 DIAGNOSIS — G620 Drug-induced polyneuropathy: Secondary | ICD-10-CM | POA: Diagnosis not present

## 2014-10-11 DIAGNOSIS — T451X5A Adverse effect of antineoplastic and immunosuppressive drugs, initial encounter: Secondary | ICD-10-CM

## 2014-10-11 DIAGNOSIS — C50212 Malignant neoplasm of upper-inner quadrant of left female breast: Secondary | ICD-10-CM

## 2014-10-11 DIAGNOSIS — Z171 Estrogen receptor negative status [ER-]: Secondary | ICD-10-CM

## 2014-10-11 LAB — COMPREHENSIVE METABOLIC PANEL (CC13)
ALT: 37 U/L (ref 0–55)
ANION GAP: 9 meq/L (ref 3–11)
AST: 27 U/L (ref 5–34)
Albumin: 4.2 g/dL (ref 3.5–5.0)
Alkaline Phosphatase: 98 U/L (ref 40–150)
BILIRUBIN TOTAL: 0.72 mg/dL (ref 0.20–1.20)
BUN: 11.4 mg/dL (ref 7.0–26.0)
CO2: 27 meq/L (ref 22–29)
CREATININE: 0.8 mg/dL (ref 0.6–1.1)
Calcium: 9.9 mg/dL (ref 8.4–10.4)
Chloride: 103 mEq/L (ref 98–109)
EGFR: 83 mL/min/{1.73_m2} — AB (ref >90)
GLUCOSE: 105 mg/dL (ref 70–140)
Potassium: 4.2 mEq/L (ref 3.5–5.1)
Sodium: 140 mEq/L (ref 136–145)
Total Protein: 7 g/dL (ref 6.4–8.3)

## 2014-10-11 LAB — CBC WITH DIFFERENTIAL/PLATELET
BASO%: 0.2 % (ref 0.0–2.0)
BASOS ABS: 0 10*3/uL (ref 0.0–0.1)
EOS ABS: 0.2 10*3/uL (ref 0.0–0.5)
EOS%: 4.5 % (ref 0.0–7.0)
HCT: 39.4 % (ref 34.8–46.6)
HEMOGLOBIN: 13.3 g/dL (ref 11.6–15.9)
LYMPH%: 18.6 % (ref 14.0–49.7)
MCH: 31 pg (ref 25.1–34.0)
MCHC: 33.8 g/dL (ref 31.5–36.0)
MCV: 91.8 fL (ref 79.5–101.0)
MONO#: 0.3 10*3/uL (ref 0.1–0.9)
MONO%: 7.6 % (ref 0.0–14.0)
NEUT%: 69.1 % (ref 38.4–76.8)
NEUTROS ABS: 3.1 10*3/uL (ref 1.5–6.5)
PLATELETS: 146 10*3/uL (ref 145–400)
RBC: 4.29 10*6/uL (ref 3.70–5.45)
RDW: 14.2 % (ref 11.2–14.5)
WBC: 4.5 10*3/uL (ref 3.9–10.3)
lymph#: 0.8 10*3/uL — ABNORMAL LOW (ref 0.9–3.3)

## 2014-10-11 NOTE — Telephone Encounter (Signed)
, °

## 2014-10-11 NOTE — Progress Notes (Signed)
OFFICE PROGRESS NOTE   10/11/2014   Physicians:David Cristy Hilts, Patsy Lager, Roni Bread Lomax  INTERVAL HISTORY:  Patient is seen, together with sign language interpreter, in follow up of adjuvant chemotherapy recently completed for triple negative left breast cancer. Chemotherapy completed 09-10-14; she is for radiation simulation by Dr Tammi Klippel on 10-12-14.  Clea is feeling very well other than residual peripheral neuropathy in fingers and toes. She is able to walk without difficulty and can sign as usual, but has not been able to tell any improvement in the neuropathy this far out from chemo. Otherwise, her energy is good, appetite fine without nausea or vomiting, no changes or discomfort in breasts, hair is growing back and she has had no bleeding and no symptoms of infection.  PAC was removed due to infection, with last 4 weekly taxol treatments given peripherally. The PAC site is closed, not tender. She had flu vaccine Genetics testing showed 3 variants of uncertain significance (ATM, [id number]; ATM, c.8674G>A and PMS2, C.1309C>T)   ONCOLOGIC HISTORY RIGHT BREAST CANCER: Right breast cancer diagnosed January 2002 when patient was age 41 and premenopausal, T2N0M0 (4cm grade 3 invasive ductal carcinoma, <25% high grade DCIS, S phase 8.6%), ER negative, PR negative, HER 2 positive. She had lumpectomy with 2 negative sentinel nodes, adjuvant chemotherapy by Dr Starr Sinclair with adriamycin cytoxan x 4 cycles from March 2002 thru May 2002 (total adriamycin dose 409 mg with weight 142, height 5'7", for total adriamycin 235 mg/m2), and local radiation (50.4 Gy with boost to 60.4) Gy Note she declined PAC and received the chemotherapy in 2002 by peripheral vein. I followed her beginning Sept 2003 until last previous visit 07-2011. She declined TEACH trial with lapatinib in 2007 and 2008. Last breast MRI previously was 08-2011.  Patient had infertility  treatments at [facility] in 2005, daughter born 02-01-2005. Menses had been very infrequent after chemo until the infertility treatment, then stopped after that pregnancy.  LEFT BREAST CANCER: Mammograms at Dr Sherran Needs office early 12-2013 had area questioned on left compared with last prior mammograms of 08-2012. Diagnostic left mammogram and [place] at [facility] on 01-01-14 showed a mass in left upper inner breast posteriorly 1.5 x 1.4 x 1.3 cm on [place], with no abnormal left axillary nodes identified. She had biopsy on 01-01-14 with pathology ([id number]) showing grade 1-2 invasive ductal carcinoma ER, PR and HER 2 negative with Ki 66%. She had breast MRI 01-15-2014 with solitary enhancing mass upper inner left breast 1.7 x 2.0 x 1.8 cm, right breast not remarkable, no abnormal appearing lymph nodes. She had CT CAP 01-29-14 then MRI liver 02-05-14 with no evidence of metastatic disease. Genetics testing with Breast/Ovarian Cancer Gene Panel 01-2014 did not reveal a pathogenic mutation in these genes, tho she did have 3 variants of uncertain significance (ATM, [id number]; ATM, c.8674G>A and PMS2, C.1309C>T). She had consultation with Dr Tammi Klippel prior to definitive surgery. She had lumpectomy and left axillary sentinel node biopsy by Dr Lucia Gaskins on 03-06-2014. Pathology [id number]) found 1.8 cm grade II invasive ductal carcinoma with one negative sentinel node and final margins negative. She had PAC placed by Dr Lucia Gaskins 03-06-14.Chemotherapy began with 2 cycles of dose dense AC on 03-30-14 and 04-13-14, which was maximal adriamycin based on previous chemo, then one treatment of cytoxan taxotere on 05-13-87 complicated by severe rash and hand foot syndrome. The second planned cycle of cytoxan taxotere was not given. She began weekly taxol on 05-21-14, total 12 cycles completed  09-10-14 after weeks 4 and 7 delayed due to low counts, and week 8 delayed due to hospitalizations/ PAC infection. PAC was removed for infection 07-11-14.Radiation  to left breast upcoming.   Review of systems as above, also: No SOB, no new or different pain. No symptoms of infection Remainder of 10 point Review of Systems negative.  Objective:  Vital signs in last 24 hours:  BP 115/77 mmHg  Pulse 78  Temp(Src) 98.2 F (36.8 C) (Oral)  Resp 18  Ht '5\' 7"'  (1.702 m)  Wt 155 lb 11.2 oz (70.625 kg)  BMI 24.38 kg/m2  SpO2 100% Weight is down 2 lbs Alert, oriented and appropriate. Ambulatory without difficulty, easily able to get on and off exam table. Hair just starting to grow back.  HEENT:PERRL, sclerae not icteric. Oral mucosa moist without lesions, posterior pharynx clear.  Neck supple. No JVD.  Lymphatics:no cervical,supraclavicular, axillary or inguinal adenopathy Resp: clear to auscultation bilaterally and normal percussion bilaterally Cardio: regular rate and rhythm. No gallop. Site of previous PAC right anterior chest has tissue defect, but skin entirely healed, no tenderness or erythema GI: soft, nontender, not distended, no mass or organomegaly. Normally active bowel sounds.  Musculoskeletal/ Extremities: without pitting edema, cords, tenderness Neuro:  peripheral neuropathy distal feet and toes > fingers. Congenital deafness, otherwise nonfocal. PSYCH mood and affect appropriate. Skin without rash, ecchymosis, petechiae. Sites of IVs LUE unremarkable Breasts: bilateral lumpectomy scars well healed, otherwise bilaterally without dominant mass, skin or nipple findings. Axillae benign.  Lab Results:  Results for orders placed or performed in visit on 10/11/14  CBC with Differential  Result Value Ref Range   WBC 4.5 3.9 - 10.3 10e3/uL   NEUT# 3.1 1.5 - 6.5 10e3/uL   HGB 13.3 11.6 - 15.9 g/dL   HCT 39.4 34.8 - 46.6 %   Platelets 146 145 - 400 10e3/uL   MCV 91.8 79.5 - 101.0 fL   MCH 31.0 25.1 - 34.0 pg   MCHC 33.8 31.5 - 36.0 g/dL   RBC 4.29 3.70 - 5.45 10e6/uL   RDW 14.2 11.2 - 14.5 %   lymph# 0.8 (L) 0.9 - 3.3 10e3/uL    MONO# 0.3 0.1 - 0.9 10e3/uL   Eosinophils Absolute 0.2 0.0 - 0.5 10e3/uL   Basophils Absolute 0.0 0.0 - 0.1 10e3/uL   NEUT% 69.1 38.4 - 76.8 %   LYMPH% 18.6 14.0 - 49.7 %   MONO% 7.6 0.0 - 14.0 %   EOS% 4.5 0.0 - 7.0 %   BASO% 0.2 0.0 - 2.0 %  Comprehensive metabolic panel (Cmet) - [facility]  Result Value Ref Range   Sodium 140 136 - 145 mEq/L   Potassium 4.2 3.5 - 5.1 mEq/L   Chloride 103 98 - 109 mEq/L   CO2 27 22 - 29 mEq/L   Glucose 105 70 - 140 mg/dl   BUN 11.4 7.0 - 26.0 mg/dL   Creatinine 0.8 0.6 - 1.1 mg/dL   Total Bilirubin 0.72 0.20 - 1.20 mg/dL   Alkaline Phosphatase 98 40 - 150 U/L   AST 27 5 - 34 U/L   ALT 37 0 - 55 U/L   Total Protein 7.0 6.4 - 8.3 g/dL   Albumin 4.2 3.5 - 5.0 g/dL   Calcium 9.9 8.4 - 10.4 mg/dL   Anion Gap 9 3 - 11 mEq/L   EGFR 83 (L) >90 ml/min/1.73 m2     Studies/Results:  No results found.  Medications: I have reviewed the patient's current medications. She  will resume Crestor  DISCUSSION: hopefully the peripheral neuropathy will gradually improve as she gets further out from chemo, tho sometimes this does not completely resolve after taxanes.  Assessment/Plan:   1.Triple negative left breast cancer T1cN0 diagnosed 12-2013, treated with lumpectomy and sentinel node evaluation 03-06-14. Adjuvant chemotherapy completed 09-10-2014; RT simulation tomorrow. I will see her back ~ late Jan, hopefully coordinating with RT, + lab. 2.PAC removed 08-03-14 for clinically obvious infection. Wound healed. 3.history T2N0M0 right breast cancer 11-2000 at age 40 treated with lumpectomy, 2 sentinel nodes, chemotherapy and local radiation. 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 Continue lots of movement and exercise, follow 5.congenital deafness. Communicates by reading/ writing and sign language.  6.genetic variants of unknown significance on genetic testing  01-2014     With assistance of interpreter, patient has asked all questions, understood discussion and is in agreement with plans above.   Anicia Leuthold P, MD   10/11/2014, 1:21 PM

## 2014-10-12 ENCOUNTER — Ambulatory Visit
Admission: RE | Admit: 2014-10-12 | Discharge: 2014-10-12 | Disposition: A | Discharge status: Home / Self Care | Payer: BLUE CROSS/BLUE SHIELD | Source: Ambulatory Visit | Attending: Radiation Oncology | Admitting: Radiation Oncology

## 2014-10-12 DIAGNOSIS — Z510 Encounter for antineoplastic radiation therapy: Secondary | ICD-10-CM | POA: Diagnosis not present

## 2014-10-12 DIAGNOSIS — C50212 Malignant neoplasm of upper-inner quadrant of left female breast: Secondary | ICD-10-CM | POA: Diagnosis not present

## 2014-10-14 NOTE — Progress Notes (Deleted)
  Radiation Oncology         (336) [phone] ________________________________  Name: Regina Watts  MRN: [id number]  Date: 10/12/2014  DOB: July 20, 1961  Optical Surface Tracking Plan:  Since 3D conformal radiation treatment methods are predicated on accurate and precise positioning for treatment, intrafraction motion monitoring is medically necessary to ensure accurate and safe treatment delivery.  The ability to quantify intrafraction motion without excessive ionizing radiation dose can only be performed with optical surface tracking. Accordingly, surface imaging offers the opportunity to obtain 3D measurements of patient position throughout 3D treatments without excessive radiation exposure.  I am ordering optical surface tracking for this patient's upcoming course of radiotherapy. ________________________________  Lora Paula, MD 10/14/2014 5:18 PM    Reference:   Ursula Alert, J, et al. Surface imaging-based analysis of intrafraction motion for breast radiotherapy patients.Journal of [place], n. 6, nov. 2014. ISSN [id number].   Available at: <http://www.jacmp.org/index.php/jacmp/article/view/4957>.

## 2014-10-14 NOTE — Progress Notes (Signed)
  Radiation Oncology         [phone] ________________________________  Name: DILARA NAVARRETE MRN: [id number]  Date: 10/12/2014  DOB: Jul 29, 1961  SIMULATION AND TREATMENT PLANNING NOTE    ICD-9-CM ICD-10-CM   1. Breast cancer of upper-inner quadrant of left female breast 174.2 C50.212     DIAGNOSIS:  53 year old woman with stage TIc (1.8 cm) N0M0 estrogen receptor negative invasive ductal carcinoma of the left breast-stage I  NARRATIVE:  The patient was brought to the [facility].  Identity was confirmed.  All relevant records and images related to the planned course of therapy were reviewed.  The patient freely provided informed written consent to proceed with treatment after reviewing the details related to the planned course of therapy. The consent form was witnessed and verified by the simulation staff.  Then, the patient was set-up in a stable reproducible  supine position for radiation therapy.  CT images were obtained.  Surface markings were placed.  The CT images were loaded into the planning software.  Then the target and avoidance structures were contoured.  Treatment planning then occurred.  The radiation prescription was entered and confirmed.  Then, I designed and supervised the construction of a total of 3 medically necessary complex treatment devices.  I have requested : 3D Simulation  I have requested a DVH of the following structures: lumpectomy site, lungs, heart and surgical scar.   PLAN:  The patient will receive 50.4 Gy in 28 fractions followed by a boost.  ________________________________  Sheral Apley. Tammi Klippel, M.D.

## 2014-10-17 DIAGNOSIS — Z510 Encounter for antineoplastic radiation therapy: Secondary | ICD-10-CM | POA: Diagnosis not present

## 2014-10-18 DIAGNOSIS — Z510 Encounter for antineoplastic radiation therapy: Secondary | ICD-10-CM | POA: Diagnosis not present

## 2014-10-19 ENCOUNTER — Ambulatory Visit
Admission: RE | Admit: 2014-10-19 | Discharge: 2014-10-19 | Disposition: A | Discharge status: Home / Self Care | Payer: BLUE CROSS/BLUE SHIELD | Source: Ambulatory Visit | Attending: Radiation Oncology | Admitting: Radiation Oncology

## 2014-10-19 DIAGNOSIS — Z510 Encounter for antineoplastic radiation therapy: Secondary | ICD-10-CM | POA: Diagnosis not present

## 2014-10-19 DIAGNOSIS — C50212 Malignant neoplasm of upper-inner quadrant of left female breast: Secondary | ICD-10-CM

## 2014-10-19 NOTE — Progress Notes (Signed)
  Radiation Oncology         (336) [phone] ________________________________  Name: Regina Watts MRN: [id number]  Date: 10/19/2014  DOB: 29-Dec-1960  Simulation Verification Note    ICD-9-CM ICD-10-CM   1. Breast cancer of upper-inner quadrant of left female breast 174.2 C50.212     Status: outpatient  NARRATIVE: The patient was brought to the treatment unit and placed in the planned treatment position. The clinical setup was verified. Then port films were obtained and uploaded to the radiation oncology medical record software.  The treatment beams were carefully compared against the planned radiation fields. The position location and shape of the radiation fields was reviewed. They targeted volume of tissue appears to be appropriately covered by the radiation beams. Organs at risk appear to be excluded as planned.  Based on my personal review, I approved the simulation verification. The patient's treatment will proceed as planned.  ------------------------------------------------  Sheral Apley Tammi Klippel, M.D.

## 2014-10-22 ENCOUNTER — Ambulatory Visit
Admission: RE | Admit: 2014-10-22 | Discharge: 2014-10-22 | Disposition: A | Discharge status: Home / Self Care | Payer: BLUE CROSS/BLUE SHIELD | Source: Ambulatory Visit | Attending: Radiation Oncology | Admitting: Radiation Oncology

## 2014-10-22 DIAGNOSIS — C50212 Malignant neoplasm of upper-inner quadrant of left female breast: Secondary | ICD-10-CM | POA: Diagnosis not present

## 2014-10-22 DIAGNOSIS — Z510 Encounter for antineoplastic radiation therapy: Secondary | ICD-10-CM | POA: Diagnosis not present

## 2014-10-23 ENCOUNTER — Ambulatory Visit
Admission: RE | Admit: 2014-10-23 | Discharge: 2014-10-23 | Disposition: A | Discharge status: Home / Self Care | Payer: BLUE CROSS/BLUE SHIELD | Source: Ambulatory Visit | Attending: Radiation Oncology | Admitting: Radiation Oncology

## 2014-10-23 DIAGNOSIS — Z510 Encounter for antineoplastic radiation therapy: Secondary | ICD-10-CM | POA: Diagnosis not present

## 2014-10-23 DIAGNOSIS — C50212 Malignant neoplasm of upper-inner quadrant of left female breast: Secondary | ICD-10-CM | POA: Diagnosis not present

## 2014-10-24 ENCOUNTER — Ambulatory Visit
Admission: RE | Admit: 2014-10-24 | Discharge: 2014-10-24 | Disposition: A | Discharge status: Home / Self Care | Payer: BLUE CROSS/BLUE SHIELD | Source: Ambulatory Visit | Attending: Radiation Oncology | Admitting: Radiation Oncology

## 2014-10-24 DIAGNOSIS — Z510 Encounter for antineoplastic radiation therapy: Secondary | ICD-10-CM | POA: Diagnosis not present

## 2014-10-24 DIAGNOSIS — C50212 Malignant neoplasm of upper-inner quadrant of left female breast: Secondary | ICD-10-CM | POA: Diagnosis not present

## 2014-10-25 ENCOUNTER — Ambulatory Visit
Admission: RE | Admit: 2014-10-25 | Discharge: 2014-10-25 | Disposition: A | Discharge status: Home / Self Care | Payer: BLUE CROSS/BLUE SHIELD | Source: Ambulatory Visit | Attending: Radiation Oncology | Admitting: Radiation Oncology

## 2014-10-25 ENCOUNTER — Encounter: Payer: Self-pay | Admitting: Radiation Oncology

## 2014-10-25 ENCOUNTER — Ambulatory Visit
Admission: RE | Admit: 2014-10-25 | Discharge: 2014-10-25 | Disposition: A | Discharge status: Home / Self Care | Payer: BC Managed Care – PPO | Source: Ambulatory Visit | Attending: Radiation Oncology | Admitting: Radiation Oncology

## 2014-10-25 DIAGNOSIS — Z510 Encounter for antineoplastic radiation therapy: Secondary | ICD-10-CM | POA: Diagnosis not present

## 2014-10-25 DIAGNOSIS — C50212 Malignant neoplasm of upper-inner quadrant of left female breast: Secondary | ICD-10-CM | POA: Diagnosis not present

## 2014-10-25 MED ORDER — RADIAPLEXRX EX GEL
Freq: Once | CUTANEOUS | Status: AC
  Administered 2014-10-25: 11:00:00 via TOPICAL

## 2014-10-25 MED ORDER — ALRA NON-METALLIC DEODORANT (RAD-ONC)
1.0000 "application " | Freq: Once | TOPICAL | Status: AC
  Administered 2014-10-25: 1 via TOPICAL

## 2014-10-25 NOTE — Progress Notes (Signed)
Denies skin changes within treatment field. Denies fatigue. No complaints at this time. Weight and vitals stable. Denies pain.

## 2014-10-25 NOTE — Progress Notes (Signed)
Oriented patient to staff and routine of the clinic. Provided patient with RADIATION THERAPY AND YOU handbook then, reviewed pertinent information. Provided patient with radiaplex and alra then, directed upon use. Educated patient reference potential side effects and management such as, fatigue and skin changes.

## 2014-10-25 NOTE — Progress Notes (Signed)
  Radiation Oncology         [phone] ________________________________  Name: Regina Watts MRN: [id number]  Date: 10/25/2014  DOB: [birth year]  Weekly Radiation Therapy Management    ICD-9-CM ICD-10-CM   1. Breast cancer of upper-inner quadrant of left female breast 174.2 C50.212 hyaluronate sodium (RADIAPLEXRX) gel     non-metallic deodorant (ALRA) 1 application    Current Dose: 7.2 Gy     Planned Dose:  60.4 Gy  Narrative . . . . . . . . The patient presents for routine under treatment assessment.                                   Denies skin changes within treatment field. Denies fatigue. No complaints at this time. Weight and vitals stable. Denies pain.                                 Set-up films were reviewed.                                 The chart was checked. Physical Findings. . .  weight is 156 lb 1.6 oz (70.806 kg). Her blood pressure is 104/63 and her pulse is 70. Her respiration is 16. . Weight essentially stable.  No significant changes. Impression . . . . . . . The patient is tolerating radiation. Plan . . . . . . . . . . . . Continue treatment as planned.  Oriented patient to staff and routine of the clinic. Sam RN Provided patient with RADIATION THERAPY AND YOU handbook then, reviewed pertinent information. Provided patient with radiaplex and alra then, directed upon use. Educated patient reference potential side effects and management such as, fatigue and skin changes.   ________________________________  Sheral Apley Tammi Klippel, M.D.

## 2014-10-26 ENCOUNTER — Ambulatory Visit
Admission: RE | Admit: 2014-10-26 | Discharge: 2014-10-26 | Disposition: A | Discharge status: Home / Self Care | Payer: BLUE CROSS/BLUE SHIELD | Source: Ambulatory Visit | Attending: Radiation Oncology | Admitting: Radiation Oncology

## 2014-10-26 DIAGNOSIS — Z510 Encounter for antineoplastic radiation therapy: Secondary | ICD-10-CM | POA: Diagnosis not present

## 2014-10-26 DIAGNOSIS — C50212 Malignant neoplasm of upper-inner quadrant of left female breast: Secondary | ICD-10-CM | POA: Diagnosis not present

## 2014-10-29 ENCOUNTER — Ambulatory Visit
Admission: RE | Admit: 2014-10-29 | Discharge: 2014-10-29 | Disposition: A | Discharge status: Home / Self Care | Payer: BLUE CROSS/BLUE SHIELD | Source: Ambulatory Visit | Attending: Radiation Oncology | Admitting: Radiation Oncology

## 2014-10-29 DIAGNOSIS — Z510 Encounter for antineoplastic radiation therapy: Secondary | ICD-10-CM | POA: Diagnosis not present

## 2014-10-29 DIAGNOSIS — C50212 Malignant neoplasm of upper-inner quadrant of left female breast: Secondary | ICD-10-CM | POA: Diagnosis not present

## 2014-10-30 ENCOUNTER — Ambulatory Visit
Admission: RE | Admit: 2014-10-30 | Discharge: 2014-10-30 | Disposition: A | Discharge status: Home / Self Care | Payer: BLUE CROSS/BLUE SHIELD | Source: Ambulatory Visit | Attending: Radiation Oncology | Admitting: Radiation Oncology

## 2014-10-30 DIAGNOSIS — Z510 Encounter for antineoplastic radiation therapy: Secondary | ICD-10-CM | POA: Diagnosis not present

## 2014-10-30 DIAGNOSIS — C50212 Malignant neoplasm of upper-inner quadrant of left female breast: Secondary | ICD-10-CM | POA: Diagnosis not present

## 2014-10-31 ENCOUNTER — Ambulatory Visit
Admission: RE | Admit: 2014-10-31 | Discharge: 2014-10-31 | Disposition: A | Discharge status: Home / Self Care | Payer: BLUE CROSS/BLUE SHIELD | Source: Ambulatory Visit | Attending: Radiation Oncology | Admitting: Radiation Oncology

## 2014-10-31 ENCOUNTER — Encounter: Payer: Self-pay | Admitting: Radiation Oncology

## 2014-10-31 ENCOUNTER — Ambulatory Visit
Admission: RE | Admit: 2014-10-31 | Discharge: 2014-10-31 | Disposition: A | Discharge status: Home / Self Care | Payer: BC Managed Care – PPO | Source: Ambulatory Visit | Attending: Radiation Oncology | Admitting: Radiation Oncology

## 2014-10-31 DIAGNOSIS — Z79899 Other long term (current) drug therapy: Secondary | ICD-10-CM | POA: Insufficient documentation

## 2014-10-31 DIAGNOSIS — Z510 Encounter for antineoplastic radiation therapy: Secondary | ICD-10-CM | POA: Insufficient documentation

## 2014-10-31 DIAGNOSIS — C50212 Malignant neoplasm of upper-inner quadrant of left female breast: Secondary | ICD-10-CM

## 2014-10-31 MED ORDER — ALRA NON-METALLIC DEODORANT (RAD-ONC)
1.0000 "application " | Freq: Once | TOPICAL | Status: AC
  Administered 2014-10-31: 1 via TOPICAL

## 2014-10-31 NOTE — Progress Notes (Signed)
   Department of Radiation Oncology  Phone:  [phone] Fax:        [phone]  Weekly Treatment Note    Name: Regina Watts Date: 10/31/2014 MRN: [id number] DOB: 10-22-1961   Current dose: 14.4 Gy  Current fraction: 8   MEDICATIONS: Current Outpatient Prescriptions  Medication Sig Dispense Refill  . non-metallic deodorant (ALRA) MISC Apply 1 application topically daily as needed.    . pyridOXINE (VITAMIN B-6) 50 MG tablet Take 50 mg by mouth 2 (two) times daily.    . sertraline (ZOLOFT) 100 MG tablet Take 1 tablet (100 mg total) by mouth daily. 30 tablet 3  . Wound Cleansers (RADIAPLEX EX) Apply topically.     No current facility-administered medications for this encounter.     ALLERGIES: Taxotere   LABORATORY DATA:  Lab Results  Component Value Date   WBC 4.5 10/11/2014   HGB 13.3 10/11/2014   HCT 39.4 10/11/2014   MCV 91.8 10/11/2014   PLT 146 10/11/2014   Lab Results  Component Value Date   NA 140 10/11/2014   K 4.2 10/11/2014   CL 109 08/02/2014   CO2 27 10/11/2014   Lab Results  Component Value Date   ALT 37 10/11/2014   AST 27 10/11/2014   ALKPHOS 98 10/11/2014   BILITOT 0.72 10/11/2014     NARRATIVE: Regina Watts was seen today for weekly treatment management. The chart was checked and the patient's films were reviewed.  Weight and vitals stable. Denies pain. Denies hyperpigmentation or desquamation of left/treated breast. Denies fatigue. Plans to begin using radiaplex lotion today as directed.  PHYSICAL EXAMINATION: weight is 157 lb 1.6 oz (71.26 kg). Her blood pressure is 102/66 and her pulse is 82. Her respiration is 16.        ASSESSMENT: The patient is doing satisfactorily with treatment.  PLAN: We will continue with the patient's radiation treatment as planned.

## 2014-10-31 NOTE — Progress Notes (Signed)
Weight and vitals stable. Denies pain. Denies hyperpigmentation or desquamation of left/treated breast. Denies fatigue. Plans to begin using radiaplex lotion today as directed.

## 2014-11-01 ENCOUNTER — Ambulatory Visit
Admission: RE | Admit: 2014-11-01 | Discharge: 2014-11-01 | Disposition: A | Discharge status: Home / Self Care | Payer: BLUE CROSS/BLUE SHIELD | Source: Ambulatory Visit | Attending: Radiation Oncology | Admitting: Radiation Oncology

## 2014-11-01 DIAGNOSIS — C50212 Malignant neoplasm of upper-inner quadrant of left female breast: Secondary | ICD-10-CM | POA: Diagnosis not present

## 2014-11-01 DIAGNOSIS — Z510 Encounter for antineoplastic radiation therapy: Secondary | ICD-10-CM | POA: Diagnosis not present

## 2014-11-05 ENCOUNTER — Ambulatory Visit
Admission: RE | Admit: 2014-11-05 | Discharge: 2014-11-05 | Disposition: A | Discharge status: Home / Self Care | Payer: BLUE CROSS/BLUE SHIELD | Source: Ambulatory Visit | Attending: Radiation Oncology | Admitting: Radiation Oncology

## 2014-11-05 DIAGNOSIS — C50212 Malignant neoplasm of upper-inner quadrant of left female breast: Secondary | ICD-10-CM | POA: Diagnosis not present

## 2014-11-05 DIAGNOSIS — Z510 Encounter for antineoplastic radiation therapy: Secondary | ICD-10-CM | POA: Diagnosis not present

## 2014-11-06 ENCOUNTER — Ambulatory Visit
Admission: RE | Admit: 2014-11-06 | Discharge: 2014-11-06 | Disposition: A | Discharge status: Home / Self Care | Payer: BLUE CROSS/BLUE SHIELD | Source: Ambulatory Visit | Attending: Radiation Oncology | Admitting: Radiation Oncology

## 2014-11-06 DIAGNOSIS — C50212 Malignant neoplasm of upper-inner quadrant of left female breast: Secondary | ICD-10-CM | POA: Diagnosis not present

## 2014-11-06 DIAGNOSIS — Z510 Encounter for antineoplastic radiation therapy: Secondary | ICD-10-CM | POA: Diagnosis not present

## 2014-11-07 ENCOUNTER — Ambulatory Visit
Admission: RE | Admit: 2014-11-07 | Discharge: 2014-11-07 | Disposition: A | Discharge status: Home / Self Care | Payer: BC Managed Care – PPO | Source: Ambulatory Visit | Attending: Radiation Oncology | Admitting: Radiation Oncology

## 2014-11-07 ENCOUNTER — Encounter: Payer: Self-pay | Admitting: Radiation Oncology

## 2014-11-07 ENCOUNTER — Ambulatory Visit
Admission: RE | Admit: 2014-11-07 | Discharge: 2014-11-07 | Disposition: A | Discharge status: Home / Self Care | Payer: BLUE CROSS/BLUE SHIELD | Source: Ambulatory Visit | Attending: Radiation Oncology | Admitting: Radiation Oncology

## 2014-11-07 VITALS — BP 102/71 | HR 83 | Temp 98.6°F | Resp 20 | Wt 159.6 lb

## 2014-11-07 DIAGNOSIS — C50212 Malignant neoplasm of upper-inner quadrant of left female breast: Secondary | ICD-10-CM | POA: Diagnosis not present

## 2014-11-07 DIAGNOSIS — Z510 Encounter for antineoplastic radiation therapy: Secondary | ICD-10-CM | POA: Diagnosis not present

## 2014-11-07 NOTE — Progress Notes (Signed)
  Radiation Oncology         [phone] ________________________________  Name: Regina Watts MRN: [id number]  Date: 11/07/2014  DOB: 1961/07/24  Weekly Radiation Therapy Management    ICD-9-CM ICD-10-CM   1. Breast cancer of upper-inner quadrant of left female breast 174.2 C50.212     Current Dose: 21.6 Gy     Planned Dose:  60.4 Gy  Narrative . . . . . . . . The patient presents for routine under treatment assessment.                                   Interpreter with patient today. Patient denies pain, fatigue, loss of appetite. She is applying Radiaplex to left breast twice daily, states her skin is very slightly pink.                                  Set-up films were reviewed.                                 The chart was checked. Physical Findings. . .  weight is 159 lb 9.6 oz (72.394 kg). Her oral temperature is 98.6 F (37 C). Her blood pressure is 102/71 and her pulse is 83. Her respiration is 20. . Weight essentially stable.  No significant changes. Impression . . . . . . . The patient is tolerating radiation. Plan . . . . . . . . . . . . Continue treatment as planned.  ________________________________  Sheral Apley. Tammi Klippel, M.D.

## 2014-11-07 NOTE — Progress Notes (Signed)
Interpreter with patient today. Patient denies pain, fatigue, loss of appetite. She is applying Radiaplex to left breast twice daily, states her skin is very slightly pink.

## 2014-11-08 ENCOUNTER — Ambulatory Visit
Admission: RE | Admit: 2014-11-08 | Discharge: 2014-11-08 | Disposition: A | Discharge status: Home / Self Care | Payer: BLUE CROSS/BLUE SHIELD | Source: Ambulatory Visit | Attending: Radiation Oncology | Admitting: Radiation Oncology

## 2014-11-08 DIAGNOSIS — Z510 Encounter for antineoplastic radiation therapy: Secondary | ICD-10-CM | POA: Diagnosis not present

## 2014-11-08 DIAGNOSIS — C50212 Malignant neoplasm of upper-inner quadrant of left female breast: Secondary | ICD-10-CM | POA: Diagnosis not present

## 2014-11-09 DIAGNOSIS — Z9221 Personal history of antineoplastic chemotherapy: Secondary | ICD-10-CM | POA: Diagnosis not present

## 2014-11-09 DIAGNOSIS — Z510 Encounter for antineoplastic radiation therapy: Secondary | ICD-10-CM | POA: Diagnosis present

## 2014-11-09 DIAGNOSIS — Z853 Personal history of malignant neoplasm of breast: Secondary | ICD-10-CM | POA: Diagnosis not present

## 2014-11-09 DIAGNOSIS — Z171 Estrogen receptor negative status [ER-]: Secondary | ICD-10-CM | POA: Diagnosis not present

## 2014-11-09 DIAGNOSIS — C50212 Malignant neoplasm of upper-inner quadrant of left female breast: Secondary | ICD-10-CM | POA: Diagnosis not present

## 2014-11-12 ENCOUNTER — Ambulatory Visit
Admission: RE | Admit: 2014-11-12 | Discharge: 2014-11-12 | Disposition: A | Discharge status: Home / Self Care | Payer: BLUE CROSS/BLUE SHIELD | Source: Ambulatory Visit | Attending: Radiation Oncology | Admitting: Radiation Oncology

## 2014-11-12 DIAGNOSIS — Z510 Encounter for antineoplastic radiation therapy: Secondary | ICD-10-CM | POA: Diagnosis not present

## 2014-11-12 DIAGNOSIS — C50212 Malignant neoplasm of upper-inner quadrant of left female breast: Secondary | ICD-10-CM | POA: Diagnosis not present

## 2014-11-13 ENCOUNTER — Ambulatory Visit
Admission: RE | Admit: 2014-11-13 | Discharge: 2014-11-13 | Disposition: A | Discharge status: Home / Self Care | Payer: BLUE CROSS/BLUE SHIELD | Source: Ambulatory Visit | Attending: Radiation Oncology | Admitting: Radiation Oncology

## 2014-11-13 DIAGNOSIS — C50212 Malignant neoplasm of upper-inner quadrant of left female breast: Secondary | ICD-10-CM | POA: Diagnosis not present

## 2014-11-13 DIAGNOSIS — Z510 Encounter for antineoplastic radiation therapy: Secondary | ICD-10-CM | POA: Diagnosis not present

## 2014-11-14 ENCOUNTER — Ambulatory Visit
Admission: RE | Admit: 2014-11-14 | Discharge: 2014-11-14 | Disposition: A | Discharge status: Home / Self Care | Payer: BLUE CROSS/BLUE SHIELD | Source: Ambulatory Visit | Attending: Radiation Oncology | Admitting: Radiation Oncology

## 2014-11-14 DIAGNOSIS — C50212 Malignant neoplasm of upper-inner quadrant of left female breast: Secondary | ICD-10-CM | POA: Diagnosis not present

## 2014-11-14 DIAGNOSIS — Z510 Encounter for antineoplastic radiation therapy: Secondary | ICD-10-CM | POA: Diagnosis not present

## 2014-11-15 ENCOUNTER — Ambulatory Visit
Admission: RE | Admit: 2014-11-15 | Discharge: 2014-11-15 | Disposition: A | Discharge status: Home / Self Care | Payer: Medicare Other | Source: Ambulatory Visit | Attending: Radiation Oncology | Admitting: Radiation Oncology

## 2014-11-15 ENCOUNTER — Encounter: Payer: Self-pay | Admitting: Radiation Oncology

## 2014-11-15 ENCOUNTER — Ambulatory Visit
Admission: RE | Admit: 2014-11-15 | Discharge: 2014-11-15 | Disposition: A | Discharge status: Home / Self Care | Payer: BLUE CROSS/BLUE SHIELD | Source: Ambulatory Visit | Attending: Radiation Oncology | Admitting: Radiation Oncology

## 2014-11-15 VITALS — BP 95/70 | HR 78 | Temp 98.5°F | Resp 12 | Wt 158.0 lb

## 2014-11-15 DIAGNOSIS — C50212 Malignant neoplasm of upper-inner quadrant of left female breast: Secondary | ICD-10-CM

## 2014-11-15 DIAGNOSIS — Z510 Encounter for antineoplastic radiation therapy: Secondary | ICD-10-CM | POA: Diagnosis not present

## 2014-11-15 NOTE — Progress Notes (Signed)
She is currently in no pain. Pt left breast noted slight erythema over treatment field, no edema noted or reported.  Continues to apply Radiaplex bid, additional tube given.

## 2014-11-15 NOTE — Progress Notes (Signed)
  Radiation Oncology         [phone] ________________________________  Name: Regina Watts MRN: [id number]  Date: 11/15/2014  DOB: 1960/12/16  Weekly Radiation Therapy Management    ICD-9-CM ICD-10-CM   1. Breast cancer of upper-inner quadrant of left female breast 174.2 C50.212     Current Dose: 30.6 Gy     Planned Dose:  60.4 Gy  Narrative . . . . . . . . The patient presents for routine under treatment assessment.                                   She is currently in no pain. Pt left breast noted slight erythema over treatment field, no edema noted or reported.  Continues to apply Radiaplex bid, additional tube given.                                 Set-up films were reviewed.                                 The chart was checked. Physical Findings. . .  weight is 158 lb (71.668 kg). Her oral temperature is 98.5 F (36.9 C). Her blood pressure is 95/70 and her pulse is 78. Her respiration is 12 and oxygen saturation is 96%. . Weight essentially stable.  No significant changes. Impression . . . . . . . The patient is tolerating radiation. Plan . . . . . . . . . . . . Continue treatment as planned.  ________________________________  Sheral Apley. Tammi Klippel, M.D.

## 2014-11-16 ENCOUNTER — Ambulatory Visit
Admission: RE | Admit: 2014-11-16 | Discharge: 2014-11-16 | Disposition: A | Discharge status: Home / Self Care | Payer: BLUE CROSS/BLUE SHIELD | Source: Ambulatory Visit | Attending: Radiation Oncology | Admitting: Radiation Oncology

## 2014-11-16 DIAGNOSIS — C50212 Malignant neoplasm of upper-inner quadrant of left female breast: Secondary | ICD-10-CM | POA: Diagnosis not present

## 2014-11-16 DIAGNOSIS — Z510 Encounter for antineoplastic radiation therapy: Secondary | ICD-10-CM | POA: Diagnosis not present

## 2014-11-19 ENCOUNTER — Ambulatory Visit
Admission: RE | Admit: 2014-11-19 | Discharge: 2014-11-19 | Disposition: A | Discharge status: Home / Self Care | Payer: BLUE CROSS/BLUE SHIELD | Source: Ambulatory Visit | Attending: Radiation Oncology | Admitting: Radiation Oncology

## 2014-11-19 DIAGNOSIS — Z510 Encounter for antineoplastic radiation therapy: Secondary | ICD-10-CM | POA: Diagnosis not present

## 2014-11-19 DIAGNOSIS — C50212 Malignant neoplasm of upper-inner quadrant of left female breast: Secondary | ICD-10-CM | POA: Diagnosis not present

## 2014-11-20 ENCOUNTER — Ambulatory Visit
Admission: RE | Admit: 2014-11-20 | Discharge: 2014-11-20 | Disposition: A | Discharge status: Home / Self Care | Payer: BLUE CROSS/BLUE SHIELD | Source: Ambulatory Visit | Attending: Radiation Oncology | Admitting: Radiation Oncology

## 2014-11-20 DIAGNOSIS — C50212 Malignant neoplasm of upper-inner quadrant of left female breast: Secondary | ICD-10-CM | POA: Diagnosis not present

## 2014-11-20 DIAGNOSIS — Z510 Encounter for antineoplastic radiation therapy: Secondary | ICD-10-CM | POA: Diagnosis not present

## 2014-11-21 ENCOUNTER — Ambulatory Visit
Admission: RE | Admit: 2014-11-21 | Discharge: 2014-11-21 | Disposition: A | Discharge status: Home / Self Care | Payer: BLUE CROSS/BLUE SHIELD | Source: Ambulatory Visit | Attending: Radiation Oncology | Admitting: Radiation Oncology

## 2014-11-21 DIAGNOSIS — C50212 Malignant neoplasm of upper-inner quadrant of left female breast: Secondary | ICD-10-CM | POA: Diagnosis not present

## 2014-11-21 DIAGNOSIS — Z510 Encounter for antineoplastic radiation therapy: Secondary | ICD-10-CM | POA: Diagnosis not present

## 2014-11-22 ENCOUNTER — Ambulatory Visit
Admission: RE | Admit: 2014-11-22 | Discharge: 2014-11-22 | Disposition: A | Discharge status: Home / Self Care | Payer: BLUE CROSS/BLUE SHIELD | Source: Ambulatory Visit | Attending: Radiation Oncology | Admitting: Radiation Oncology

## 2014-11-22 DIAGNOSIS — Z510 Encounter for antineoplastic radiation therapy: Secondary | ICD-10-CM | POA: Diagnosis not present

## 2014-11-22 DIAGNOSIS — C50212 Malignant neoplasm of upper-inner quadrant of left female breast: Secondary | ICD-10-CM | POA: Diagnosis not present

## 2014-11-23 ENCOUNTER — Ambulatory Visit
Admission: RE | Admit: 2014-11-23 | Discharge: 2014-11-23 | Disposition: A | Discharge status: Home / Self Care | Payer: BLUE CROSS/BLUE SHIELD | Source: Ambulatory Visit | Attending: Radiation Oncology | Admitting: Radiation Oncology

## 2014-11-23 ENCOUNTER — Encounter: Payer: Self-pay | Admitting: Radiation Oncology

## 2014-11-23 VITALS — BP 112/71 | HR 91 | Resp 16 | Wt 158.9 lb

## 2014-11-23 DIAGNOSIS — C50212 Malignant neoplasm of upper-inner quadrant of left female breast: Secondary | ICD-10-CM | POA: Diagnosis not present

## 2014-11-23 DIAGNOSIS — Z510 Encounter for antineoplastic radiation therapy: Secondary | ICD-10-CM | POA: Diagnosis not present

## 2014-11-23 NOTE — Progress Notes (Signed)
Hyperpigmentation without desquamation of left/treated breast noted. Encouraged patient to use radiaplex bid. Patient reports she "normally uses it once but, forgets to apply it a second time." Nipple of left breast dry. Encouraged patient to apply radiaplex to this area. Patient verbalized understanding. No lymphedema of left arm noted. Full ROM of both upper extremities noted. Denies breast pain or nipple discharge. Reports mild fatigue. Weight and vitals stable.

## 2014-11-23 NOTE — Progress Notes (Signed)
  Radiation Oncology         [phone] ________________________________  Name: Regina Watts  MRN: [id number]  Date: 11/23/2014  DOB: 1961/07/19  Weekly Radiation Therapy Management    ICD-9-CM ICD-10-CM   1. Breast cancer of upper-inner quadrant of left female breast 174.2 C50.212     Current Dose: 41.4 Gy     Planned Dose:  60.4 Gy  Narrative . . . . . . . . The patient presents for routine under treatment assessment.                                   Hyperpigmentation without desquamation of left/treated breast noted. Encouraged patient to use radiaplex bid. Patient reports she "normally uses it once but, forgets to apply it a second time." Nipple of left breast dry. Encouraged patient to apply radiaplex to this area. Patient verbalized understanding. No lymphedema of left arm noted. Full ROM of both upper extremities noted. Denies breast pain or nipple discharge. Reports mild fatigue. Weight and vitals stable.                                  Set-up films were reviewed.                                 The chart was checked. Physical Findings. . .  weight is 158 lb 14.4 oz (72.077 kg). Her blood pressure is 112/71 and her pulse is 91. Her respiration is 16. . Weight essentially stable. She has erythema of the breast with no desquamation. No significant changes. Impression . . . . . . . The patient is tolerating radiation. Plan . . . . . . . . . . . . Continue treatment as planned.  ________________________________  Sheral Apley. Tammi Klippel, M.D.

## 2014-11-26 ENCOUNTER — Ambulatory Visit
Admission: RE | Admit: 2014-11-26 | Discharge: 2014-11-26 | Disposition: A | Discharge status: Home / Self Care | Payer: BLUE CROSS/BLUE SHIELD | Source: Ambulatory Visit | Attending: Radiation Oncology | Admitting: Radiation Oncology

## 2014-11-26 DIAGNOSIS — C50212 Malignant neoplasm of upper-inner quadrant of left female breast: Secondary | ICD-10-CM | POA: Diagnosis not present

## 2014-11-26 DIAGNOSIS — Z510 Encounter for antineoplastic radiation therapy: Secondary | ICD-10-CM | POA: Diagnosis not present

## 2014-11-27 ENCOUNTER — Ambulatory Visit
Admission: RE | Admit: 2014-11-27 | Discharge: 2014-11-27 | Disposition: A | Discharge status: Home / Self Care | Payer: BLUE CROSS/BLUE SHIELD | Source: Ambulatory Visit | Attending: Radiation Oncology | Admitting: Radiation Oncology

## 2014-11-27 DIAGNOSIS — Z510 Encounter for antineoplastic radiation therapy: Secondary | ICD-10-CM | POA: Diagnosis not present

## 2014-11-27 DIAGNOSIS — C50212 Malignant neoplasm of upper-inner quadrant of left female breast: Secondary | ICD-10-CM

## 2014-11-27 MED ORDER — RADIAPLEXRX EX GEL
Freq: Once | CUTANEOUS | Status: AC
  Administered 2014-11-27: 10:00:00 via TOPICAL

## 2014-11-28 ENCOUNTER — Ambulatory Visit
Admission: RE | Admit: 2014-11-28 | Discharge: 2014-11-28 | Disposition: A | Discharge status: Home / Self Care | Payer: BLUE CROSS/BLUE SHIELD | Source: Ambulatory Visit | Attending: Radiation Oncology | Admitting: Radiation Oncology

## 2014-11-28 DIAGNOSIS — C50212 Malignant neoplasm of upper-inner quadrant of left female breast: Secondary | ICD-10-CM | POA: Diagnosis not present

## 2014-11-28 DIAGNOSIS — Z510 Encounter for antineoplastic radiation therapy: Secondary | ICD-10-CM | POA: Diagnosis not present

## 2014-11-29 ENCOUNTER — Ambulatory Visit
Admission: RE | Admit: 2014-11-29 | Discharge: 2014-11-29 | Disposition: A | Discharge status: Home / Self Care | Payer: Medicare Other | Source: Ambulatory Visit | Attending: Radiation Oncology | Admitting: Radiation Oncology

## 2014-11-29 ENCOUNTER — Ambulatory Visit
Admission: RE | Admit: 2014-11-29 | Discharge: 2014-11-29 | Disposition: A | Discharge status: Home / Self Care | Payer: BLUE CROSS/BLUE SHIELD | Source: Ambulatory Visit | Attending: Radiation Oncology | Admitting: Radiation Oncology

## 2014-11-29 DIAGNOSIS — C50212 Malignant neoplasm of upper-inner quadrant of left female breast: Secondary | ICD-10-CM

## 2014-11-29 DIAGNOSIS — Z510 Encounter for antineoplastic radiation therapy: Secondary | ICD-10-CM | POA: Diagnosis not present

## 2014-11-29 NOTE — Progress Notes (Signed)
  Radiation Oncology         [phone] ________________________________  Name: Regina Watts  MRN: [id number]  Date: 11/29/2014  DOB: 1961-01-09  Weekly Radiation Therapy Management    ICD-9-CM ICD-10-CM   1. Breast cancer of upper-inner quadrant of left female breast 174.2 C50.212     Current Dose: 48.6 Gy     Planned Dose:  60.4 Gy  Narrative . . . . . . . . The patient presents for routine under treatment assessment.                                   Hyperpigmentation and erythema without desquamation of left/treated breast noted.  Nipple of left breast dry. Reports mild fatigue. Weight and vitals stable.                                  Set-up films were reviewed.                                 The chart was checked. Physical Findings. . .  Weight essentially stable. She has erythema of the breast with no desquamation. No significant changes. Impression . . . . . . . The patient is tolerating radiation. Plan . . . . . . . . . . . . Continue treatment as planned.  ________________________________  Sheral Apley. Tammi Klippel, M.D.

## 2014-11-30 ENCOUNTER — Ambulatory Visit: Payer: Medicare Other | Admitting: Radiation Oncology

## 2014-11-30 ENCOUNTER — Ambulatory Visit: Payer: BLUE CROSS/BLUE SHIELD

## 2014-12-02 ENCOUNTER — Other Ambulatory Visit: Payer: Self-pay | Admitting: Oncology

## 2014-12-02 DIAGNOSIS — C50212 Malignant neoplasm of upper-inner quadrant of left female breast: Secondary | ICD-10-CM

## 2014-12-03 ENCOUNTER — Ambulatory Visit: Payer: BLUE CROSS/BLUE SHIELD

## 2014-12-03 ENCOUNTER — Telehealth: Payer: Self-pay

## 2014-12-03 ENCOUNTER — Ambulatory Visit (HOSPITAL_BASED_OUTPATIENT_CLINIC_OR_DEPARTMENT_OTHER): Payer: Medicare Other | Admitting: Oncology

## 2014-12-03 ENCOUNTER — Encounter: Payer: Self-pay | Admitting: Oncology

## 2014-12-03 ENCOUNTER — Other Ambulatory Visit (HOSPITAL_BASED_OUTPATIENT_CLINIC_OR_DEPARTMENT_OTHER): Payer: BLUE CROSS/BLUE SHIELD

## 2014-12-03 VITALS — BP 103/56 | HR 73 | Temp 98.2°F | Resp 17 | Ht 67.0 in | Wt 160.1 lb

## 2014-12-03 DIAGNOSIS — G622 Polyneuropathy due to other toxic agents: Secondary | ICD-10-CM | POA: Diagnosis not present

## 2014-12-03 DIAGNOSIS — H905 Unspecified sensorineural hearing loss: Secondary | ICD-10-CM | POA: Diagnosis not present

## 2014-12-03 DIAGNOSIS — D696 Thrombocytopenia, unspecified: Secondary | ICD-10-CM | POA: Diagnosis not present

## 2014-12-03 DIAGNOSIS — T451X5A Adverse effect of antineoplastic and immunosuppressive drugs, initial encounter: Secondary | ICD-10-CM

## 2014-12-03 DIAGNOSIS — C50212 Malignant neoplasm of upper-inner quadrant of left female breast: Secondary | ICD-10-CM

## 2014-12-03 DIAGNOSIS — G620 Drug-induced polyneuropathy: Secondary | ICD-10-CM

## 2014-12-03 DIAGNOSIS — Z853 Personal history of malignant neoplasm of breast: Secondary | ICD-10-CM

## 2014-12-03 LAB — CBC WITH DIFFERENTIAL/PLATELET
BASO%: 0.2 % (ref 0.0–2.0)
Basophils Absolute: 0 10*3/uL (ref 0.0–0.1)
EOS%: 7.2 % — AB (ref 0.0–7.0)
Eosinophils Absolute: 0.3 10*3/uL (ref 0.0–0.5)
HCT: 40.5 % (ref 34.8–46.6)
HGB: 13.6 g/dL (ref 11.6–15.9)
LYMPH#: 0.7 10*3/uL — AB (ref 0.9–3.3)
LYMPH%: 18 % (ref 14.0–49.7)
MCH: 30.8 pg (ref 25.1–34.0)
MCHC: 33.6 g/dL (ref 31.5–36.0)
MCV: 91.8 fL (ref 79.5–101.0)
MONO#: 0.3 10*3/uL (ref 0.1–0.9)
MONO%: 8.4 % (ref 0.0–14.0)
NEUT#: 2.7 10*3/uL (ref 1.5–6.5)
NEUT%: 66.2 % (ref 38.4–76.8)
Platelets: 138 10*3/uL — ABNORMAL LOW (ref 145–400)
RBC: 4.41 10*6/uL (ref 3.70–5.45)
RDW: 13.2 % (ref 11.2–14.5)
WBC: 4.1 10*3/uL (ref 3.9–10.3)

## 2014-12-03 LAB — COMPREHENSIVE METABOLIC PANEL (CC13)
ALK PHOS: 91 U/L (ref 40–150)
ALT: 30 U/L (ref 0–55)
ANION GAP: 7 meq/L (ref 3–11)
AST: 23 U/L (ref 5–34)
Albumin: 4 g/dL (ref 3.5–5.0)
BILIRUBIN TOTAL: 0.66 mg/dL (ref 0.20–1.20)
BUN: 13.2 mg/dL (ref 7.0–26.0)
CO2: 28 meq/L (ref 22–29)
CREATININE: 0.8 mg/dL (ref 0.6–1.1)
Calcium: 9.3 mg/dL (ref 8.4–10.4)
Chloride: 107 mEq/L (ref 98–109)
EGFR: 86 mL/min/{1.73_m2} — AB (ref >90)
Glucose: 120 mg/dl (ref 70–140)
Potassium: 4.3 mEq/L (ref 3.5–5.1)
Sodium: 142 mEq/L (ref 136–145)
Total Protein: 6.4 g/dL (ref 6.4–8.3)

## 2014-12-03 MED ORDER — GABAPENTIN 100 MG PO CAPS: 100.0000 mg | ORAL_CAPSULE | Freq: Every day | ORAL | Status: DC

## 2014-12-03 NOTE — Progress Notes (Signed)
OFFICE PROGRESS NOTE     Physicians:David Cristy Hilts, Patsy Lager, Roni Bread Lomax  INTERVAL HISTORY:  Patient is seen, together with sign language interpreter and young daughter, in continuing attention to triple negative left breast cancer, chemotherapy completed 09-10-14 and local radiation in process, to complete 12-10-14. She has skin reaction from radiation as anticipated, but overall is tolerating this well.  Peripheral neuropathy from taxanes involveing fingers and distal feet/ toes has not improved noticeably since completion of chemotherapy. This is noticeable at all times, more bothersome at night, does not interfere with walking. Energy is generally good, no GI or respiratory symptoms. No bleeding or bruising.   PAC was removed with site infection.  Flu vaccine done Genetics testing with Breast/Ovarian Cancer Gene Panel 01-2014 showed 3 variants of uncertain significance (ATM, [id number]; ATM, c.8674G>A and PMS2, C.1309C>T)  ONCOLOGIC HISTORY RIGHT BREAST CANCER: Right breast cancer diagnosed January 2002 when patient was age 73 and premenopausal, T2N0M0 (4cm grade 3 invasive ductal carcinoma, <25% high grade DCIS, S phase 8.6%), ER negative, PR negative, HER 2 positive. She had lumpectomy with 2 negative sentinel nodes, adjuvant chemotherapy by Dr Starr Sinclair with adriamycin cytoxan x 4 cycles from March 2002 thru May 2002 (total adriamycin dose 409 mg with weight 142, height 5'7", for total adriamycin 235 mg/m2), and local radiation (50.4 Gy with boost to 60.4) Gy Note she declined PAC and received the chemotherapy in 2002 by peripheral vein. I followed her beginning Sept 2003 until last previous visit 07-2011. She declined TEACH trial with lapatinib in 2007 and 2008. Last breast MRI previously was 08-2011.  Patient had infertility treatments at [facility] in 2005, daughter born 02-01-2005. Menses had been very infrequent after chemo until the  infertility treatment, then stopped after that pregnancy.  LEFT BREAST CANCER: Mammograms at Dr Sherran Needs office early 12-2013 had area questioned on left compared with last prior mammograms of 08-2012. Diagnostic left mammogram and [place] at [facility] on 01-01-14 showed a mass in left upper inner breast posteriorly 1.5 x 1.4 x 1.3 cm on [place], with no abnormal left axillary nodes identified. She had biopsy on 01-01-14 with pathology ([id number]) showing grade 1-2 invasive ductal carcinoma ER, PR and HER 2 negative with Ki 66%. She had breast MRI 01-15-2014 with solitary enhancing mass upper inner left breast 1.7 x 2.0 x 1.8 cm, right breast not remarkable, no abnormal appearing lymph nodes. She had CT CAP 01-29-14 then MRI liver 02-05-14 with no evidence of metastatic disease. Genetics testing with Breast/Ovarian Cancer Gene Panel 01-2014 did not reveal a pathogenic mutation in these genes, tho she did have 3 variants of uncertain significance (ATM, [id number]; ATM, c.8674G>A and PMS2, C.1309C>T). She had consultation with Dr Tammi Klippel prior to definitive surgery. She had lumpectomy and left axillary sentinel node biopsy by Dr Lucia Gaskins on 03-06-2014. Pathology [id number]) found 1.8 cm grade II invasive ductal carcinoma with one negative sentinel node and final margins negative. She had PAC placed by Dr Lucia Gaskins 03-06-14.Chemotherapy began with 2 cycles of dose dense AC on 03-30-14 and 04-13-14, which was maximal adriamycin based on previous chemo, then one treatment of cytoxan taxotere on 5-46-56 complicated by severe rash and hand foot syndrome. The second planned cycle of cytoxan taxotere was not given. She began weekly taxol on 05-21-14, total 12 cycles completed 09-10-14 after weeks 4 and 7 delayed due to low counts, and week 8 delayed due to hospitalizations/ PAC infection. PAC was removed for infection 07-11-14.Radiation to left breast  by Dr Tammi Klippel to complete 12-10-14.     Review of systems as above, also: Is using  topical from radiation oncology to left breast for skin reaction, understands that she is not to use any other lotion until RT completed. Appetite good. No fever or symptoms of infection. Sleeps well some nights and poorly others. No swelling UE or LE. Remainder of 10 point Review of Systems negative.  Objective:  Vital signs in last 24 hours:  BP 103/56 mmHg  Pulse 73  Temp(Src) 98.2 F (36.8 C) (Oral)  Resp 17  Ht '5\' 7"'  (1.702 m)  Wt 160 lb 1.6 oz (72.621 kg)  BMI 25.07 kg/m2  SpO2 96% weight up 4 lbs.  Alert, oriented and appropriate. Ambulatory without difficulty.  Hair is growing back.  HEENT:PERRL, sclerae not icteric. Oral mucosa moist without lesions, posterior pharynx clear.  Neck supple. No JVD.  Lymphatics:no cervical,supraclavicular, axillary or inguinal adenopathy Resp: clear to auscultation bilaterally and normal percussion bilaterally Cardio: regular rate and rhythm. No gallop. GI: soft, nontender, not distended, no mass or organomegaly. Normally active bowel sounds. Musculoskeletal/ Extremities: UE and LE without pitting edema, cords, tenderness Neuro: no change peripheral neuropathy fingers and distal feet and toes. Congenital deafness. Otherwise nonfocal. PSYCH appropriate mood and affect Skin without rash, ecchymosis, petechiae. See below Breasts: Right with well healed lumpectomy scar, no dominant mass, skin or nipple findings. Left with marked erythema and minimal desquamation in RT field, lumpectomy scar otherwise not remarkable. Axillae benign. Portacath scar right anterior chest closed, not tender  Lab Results:  Results for orders placed or performed in visit on 12/03/14  CBC with Differential  Result Value Ref Range   WBC 4.1 3.9 - 10.3 10e3/uL   NEUT# 2.7 1.5 - 6.5 10e3/uL   HGB 13.6 11.6 - 15.9 g/dL   HCT 40.5 34.8 - 46.6 %   Platelets 138 (L) 145 - 400 10e3/uL   MCV 91.8 79.5 - 101.0 fL   MCH 30.8 25.1 - 34.0 pg   MCHC 33.6 31.5 - 36.0 g/dL    RBC 4.41 3.70 - 5.45 10e6/uL   RDW 13.2 11.2 - 14.5 %   lymph# 0.7 (L) 0.9 - 3.3 10e3/uL   MONO# 0.3 0.1 - 0.9 10e3/uL   Eosinophils Absolute 0.3 0.0 - 0.5 10e3/uL   Basophils Absolute 0.0 0.0 - 0.1 10e3/uL   NEUT% 66.2 38.4 - 76.8 %   LYMPH% 18.0 14.0 - 49.7 %   MONO% 8.4 0.0 - 14.0 %   EOS% 7.2 (H) 0.0 - 7.0 %   BASO% 0.2 0.0 - 2.0 %    CMET available after visit entirely normal  Studies/Results:  Report of last bilateral mammograms requested and received from Dr Jenny Reichmann McComb's office, will be scanned into this EMR as not included in this EMR information at the initial diagnosis of left breast cancer. These bilateral mammograms were done 12-18-2013.  She will be due bilateral mammograms 12-2014, which I have requested to be done at [facility] late Feb due to RT timing. Obviously there may be imaging findings related to recent RT, but still seems best to get bilateral imaging on time even if need to follow up left later.  Medications: I have reviewed the patient's current medications. WIll try gabapentin 100 mg at hs for neuropathy symptoms.  DISCUSSION: skin reaction from RT, mammograms as above. Again discussed peripheral neuropathy related to taxanes, and she understands that gabapentin will not resolve problem but may improve symptoms.   Assessment/Plan:  1.Triple negative left breast cancer T1cN0 diagnosed 12-2013, treated with lumpectomy and sentinel node evaluation 03-06-14. Adjuvant chemotherapy completed 09-10-2014; RT in process thru 12-10-14. I will see her back in 3 months with labs. Bilateral mammograms at [facility] late Feb. 2.PAC removed 08-03-14 for site nfection. Wound healed. 3.history T2N0M0 right breast cancer 11-2000 at age 88 treated with lumpectomy, 2 sentinel nodes, chemotherapy and local radiation. 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 Continue lots of movement and  exercise, add gabapentin at hs 5.congenital deafness. Communicates by reading/ writing and sign language.  6.genetic variants of unknown significance on genetic testing 01-2014  7.negative colonoscopy by Dr Earle Gell ~ Jan 2015 8.slightly lower platelet count today, no bleeding, follow. Other counts have recovered from chemo.  Patient follows discussion well with assistance of sign language interpreter and has had all questions answered. She can let us know if gabapentin is helpful at hs/ if she would then like to try this also during day, tho she is aware that it might make her drowsy at least initially.  Cc this note to Dr Tammi Klippel re timing of mammograms, and Drs Lucia Gaskins, Lysle Rubens, McComb Time spent 25 min including >50% counseling and coordination of care.   Halton Neas P, MD   12/03/2014, 9:00 AM

## 2014-12-03 NOTE — Telephone Encounter (Signed)
Received mammogram report as requested by Dr. Marko Plume.  A copy was sent to Him to be scanned into patient's EMR and a copy to Dr. Marko Plume.

## 2014-12-03 NOTE — Telephone Encounter (Signed)
-----   Message from Gordy Levan, MD sent at 12/02/2014  1:50 PM EST ----- Please get copy of report of bilateral mammograms done at Dr Darlyn Chamber office prior to on on 12-21-13. One copy to me and one copy to HIM to scan please. I do not need this before I see Chayanne on 1-25 AM, but want it in this EMR and want to be sure I get date correct for next bilateral mammograms.  thanks

## 2014-12-04 ENCOUNTER — Ambulatory Visit: Payer: BLUE CROSS/BLUE SHIELD

## 2014-12-04 ENCOUNTER — Ambulatory Visit
Admission: RE | Admit: 2014-12-04 | Discharge: 2014-12-04 | Disposition: A | Discharge status: Home / Self Care | Payer: BLUE CROSS/BLUE SHIELD | Source: Ambulatory Visit | Attending: Radiation Oncology | Admitting: Radiation Oncology

## 2014-12-04 DIAGNOSIS — C50212 Malignant neoplasm of upper-inner quadrant of left female breast: Secondary | ICD-10-CM | POA: Diagnosis not present

## 2014-12-04 DIAGNOSIS — Z510 Encounter for antineoplastic radiation therapy: Secondary | ICD-10-CM | POA: Diagnosis not present

## 2014-12-05 ENCOUNTER — Ambulatory Visit: Payer: BLUE CROSS/BLUE SHIELD

## 2014-12-05 ENCOUNTER — Ambulatory Visit
Admission: RE | Admit: 2014-12-05 | Discharge: 2014-12-05 | Disposition: A | Discharge status: Home / Self Care | Payer: BLUE CROSS/BLUE SHIELD | Source: Ambulatory Visit | Attending: Radiation Oncology | Admitting: Radiation Oncology

## 2014-12-05 DIAGNOSIS — Z510 Encounter for antineoplastic radiation therapy: Secondary | ICD-10-CM | POA: Diagnosis not present

## 2014-12-06 ENCOUNTER — Ambulatory Visit
Admission: RE | Admit: 2014-12-06 | Discharge: 2014-12-06 | Disposition: A | Discharge status: Home / Self Care | Payer: Medicare Other | Source: Ambulatory Visit | Attending: Radiation Oncology | Admitting: Radiation Oncology

## 2014-12-06 ENCOUNTER — Telehealth: Payer: Self-pay | Admitting: Oncology

## 2014-12-06 ENCOUNTER — Encounter: Payer: Self-pay | Admitting: Radiation Oncology

## 2014-12-06 ENCOUNTER — Ambulatory Visit
Admission: RE | Admit: 2014-12-06 | Discharge: 2014-12-06 | Disposition: A | Discharge status: Home / Self Care | Payer: BLUE CROSS/BLUE SHIELD | Source: Ambulatory Visit | Attending: Radiation Oncology | Admitting: Radiation Oncology

## 2014-12-06 ENCOUNTER — Other Ambulatory Visit: Payer: Self-pay | Admitting: Oncology

## 2014-12-06 DIAGNOSIS — Z510 Encounter for antineoplastic radiation therapy: Secondary | ICD-10-CM | POA: Diagnosis not present

## 2014-12-06 DIAGNOSIS — C50212 Malignant neoplasm of upper-inner quadrant of left female breast: Secondary | ICD-10-CM

## 2014-12-06 MED ORDER — BIAFINE EX EMUL
Freq: Every day | CUTANEOUS | Status: DC
  Administered 2014-12-06: 10:00:00 via TOPICAL

## 2014-12-06 NOTE — Progress Notes (Signed)
  Radiation Oncology         [phone] ________________________________  Name: Regina Watts  MRN: [id number]  Date: 12/06/2014  DOB: Nov 25, 1960  Weekly Radiation Therapy Management    ICD-9-CM ICD-10-CM   1. Breast cancer of upper-inner quadrant of left female breast 174.2 C50.212 topical emolient (BIAFINE) emulsion    Current Dose: 54.4 Gy     Planned Dose:  60.4 Gy  Narrative . . . . . . . . The patient presents for routine under treatment assessment.                                   Hyperpigmentation with mild desquamation of left/treated breast. Reports skin of left breast is itchy. Instructed patient to stop radiaplex and begin using biafine to treated skin. Patient verbalized understanding. Reports fatigue. Weight and vitals stable. Denies pain.                                 Set-up films were reviewed.                                 The chart was checked. Physical Findings. . .  weight is 159 lb 14.4 oz (72.53 kg). Her blood pressure is 104/67 and her pulse is 75. Her respiration is 16. . Weight essentially stable.  No significant changes. Impression . . . . . . . The patient is tolerating radiation. Plan . . . . . . . . . . . . Continue treatment as planned.  ________________________________  Regina Watts. Regina Watts, M.D.

## 2014-12-06 NOTE — Telephone Encounter (Signed)
Medical Oncology  Communication with Dr Tammi Klippel, who suggests right mammogram only when due bilateral mammograms in Feb 2016, then left mammogram in Aug 2016 due to RT to left just completing Dec 10, 2014. [place] as this MD unable to cancel order due to already scheduled, then spoke with radiologist at request of office staff due to patient being due bilateral mammograms in Feb. Radiologist felt best to try bilaterals in late Feb, tho if too uncomfortable on left could change at time of visit to right only. Radiologist will request 6 mo follow up or whatever appropriate depending on amount of RT changes present.  As we were discussing, I saw that mammograms had actually been scheduled for 12-12-14 rather than 01-07-15 as ordered. Original order cancelled at my request by [place] and another order placed specifically stating no sooner than 01-07-15 and to do right only if left too uncomfortable. RN to let patient know rescheduled date and time when she comes for RT next week (patient deaf).  Godfrey Pick, MD

## 2014-12-06 NOTE — Progress Notes (Signed)
Hyperpigmentation with mild desquamation of left/treated breast. Reports skin of left breast is itchy. Instructed patient to stop radiaplex and begin using biafine to treated skin. Patient verbalized understanding. Reports fatigue. Weight and vitals stable. Denies pain.

## 2014-12-07 ENCOUNTER — Ambulatory Visit: Payer: BLUE CROSS/BLUE SHIELD

## 2014-12-07 ENCOUNTER — Ambulatory Visit
Admission: RE | Admit: 2014-12-07 | Discharge: 2014-12-07 | Disposition: A | Discharge status: Home / Self Care | Payer: BLUE CROSS/BLUE SHIELD | Source: Ambulatory Visit | Attending: Radiation Oncology | Admitting: Radiation Oncology

## 2014-12-07 ENCOUNTER — Telehealth: Payer: Self-pay

## 2014-12-07 DIAGNOSIS — C50212 Malignant neoplasm of upper-inner quadrant of left female breast: Secondary | ICD-10-CM | POA: Diagnosis not present

## 2014-12-07 DIAGNOSIS — Z510 Encounter for antineoplastic radiation therapy: Secondary | ICD-10-CM | POA: Diagnosis not present

## 2014-12-07 NOTE — Telephone Encounter (Signed)
Told Regina Watts that her appointment for her mammograms on 12-12-14 was cancelled and changed to 01-07-15 at 1100 at the [facility]: [name] Dr. Marko Plume said that 12-12-14 was too soon after her radiation for imaging.  Gave Regina Watts a new appointment schedule.  Interpreter present for conversation.

## 2014-12-09 NOTE — Progress Notes (Signed)
  Radiation Oncology         [phone] ________________________________  Name: LEAIRA FULLAM MRN: [id number]  Date: 12/10/2014  DOB: June 01, 1961  Weekly Radiation Therapy Management    ICD-9-CM ICD-10-CM   1. Breast cancer of upper-inner quadrant of left female breast 174.2 C50.212 topical emolient (BIAFINE) emulsion    Current Dose: 58.4 Gy     Planned Dose:  60.4 Gy  Narrative . . . . . . . . The patient presents for routine under treatment assessment after her penultimate fraction.                                  Hyperpigmentation with mild desquamation of left/treated breast. Reports skin of left breast is itchy. Instructed patient to stop radiaplex and begin using biafine to treated skin. Patient verbalized understanding. Reports fatigue. Weight and vitals stable. Denies pain. The patient is without complaint.                                 Set-up films were reviewed.                                 The chart was checked. Physical Findings. . .  weight is 160 lb 3.2 oz (72.666 kg). Her blood pressure is 104/69 and her pulse is 71. Her respiration is 16. . Weight essentially stable.  No significant changes. Impression . . . . . . . The patient is tolerating radiation. Plan . . . . . . . . . . . . Continue treatment as planned.  ________________________________  Sheral Apley. Tammi Klippel, M.D.

## 2014-12-10 ENCOUNTER — Encounter: Payer: Self-pay | Admitting: Radiation Oncology

## 2014-12-10 ENCOUNTER — Ambulatory Visit
Admission: RE | Admit: 2014-12-10 | Discharge: 2014-12-10 | Disposition: A | Discharge status: Home / Self Care | Payer: Medicare Other | Source: Ambulatory Visit | Attending: Radiation Oncology | Admitting: Radiation Oncology

## 2014-12-10 ENCOUNTER — Ambulatory Visit: Payer: BLUE CROSS/BLUE SHIELD

## 2014-12-10 ENCOUNTER — Ambulatory Visit
Admission: RE | Admit: 2014-12-10 | Discharge: 2014-12-10 | Disposition: A | Discharge status: Home / Self Care | Payer: BLUE CROSS/BLUE SHIELD | Source: Ambulatory Visit | Attending: Radiation Oncology | Admitting: Radiation Oncology

## 2014-12-10 VITALS — BP 104/69 | HR 71 | Resp 16 | Wt 160.2 lb

## 2014-12-10 DIAGNOSIS — Z510 Encounter for antineoplastic radiation therapy: Secondary | ICD-10-CM | POA: Diagnosis not present

## 2014-12-10 DIAGNOSIS — C50212 Malignant neoplasm of upper-inner quadrant of left female breast: Secondary | ICD-10-CM

## 2014-12-10 MED ORDER — BIAFINE EX EMUL
Freq: Every day | CUTANEOUS | Status: DC
  Administered 2014-12-10: 11:00:00 via TOPICAL

## 2014-12-10 NOTE — Progress Notes (Signed)
Hyperpigmentation with mild desquamation of left/treated breast. Reports skin continues to be itchy. Reports using Biafine bid as directed. Encouraged patient to continue use of Biafine and add hydrocortisone for itching. Provided patient with another tube of Biafine. Instructed to continue biafine bid x 2 following completion. Patient verbalized understanding. Reports fatigue. Weight and vitals stable. Denies pain. One month follow up appointment card given. Understands to contact staff with future needs.

## 2014-12-11 ENCOUNTER — Ambulatory Visit
Admission: RE | Admit: 2014-12-11 | Discharge: 2014-12-11 | Disposition: A | Discharge status: Home / Self Care | Payer: BLUE CROSS/BLUE SHIELD | Source: Ambulatory Visit | Attending: Radiation Oncology | Admitting: Radiation Oncology

## 2014-12-11 ENCOUNTER — Encounter: Payer: Self-pay | Admitting: Radiation Oncology

## 2014-12-11 DIAGNOSIS — Z510 Encounter for antineoplastic radiation therapy: Secondary | ICD-10-CM | POA: Diagnosis not present

## 2014-12-17 NOTE — Progress Notes (Signed)
  Radiation Oncology         [phone] ________________________________  Name: Regina Watts MRN: [id number]  Date: 12/11/2014  DOB: 05/09/1961  End of Treatment Note  Diagnosis:   54 year old woman with stage TIc (1.8 cm) N0M0 estrogen receptor negative invasive ductal carcinoma of the left breast-stage I     Indication for treatment:  Curative, Breast Conservation       Radiation treatment dates:   10/22/2014-12/11/2014  Site/dose:    1.  The whole left breast was treated to 50.4 Gy in 28 fractions of 1.8 Gy 2.  The lumpectomy surgical site was treated to a total dose of 60.4 Gy with 5 additional fractions of 2 Gy  Beams/energy:    1.  The whole left breast was treated using tangential 6 megavolt x-ray beams with one parent field from the medial and lateral gantry positions as well as 1 electronic compensated field from the medial lateral directions to provide a homogeneous coverage of the breast without hot spots. 2.  The lumpectomy surgical site was treated comprehensively including the surgical scar and lumpectomy cavity internally using 12 MeV electrons  Narrative: The patient tolerated radiation treatment relatively well.   She experienced radiation dermatitis in the form of erythema and hyperpigmentation with some dry desquamation changes.  Plan: The patient has completed radiation treatment. The patient will return to radiation oncology clinic for routine followup in one month. I advised them to call or return sooner if they have any questions or concerns related to their recovery or treatment. ________________________________  Sheral Apley. Tammi Klippel, M.D.

## 2014-12-23 NOTE — Progress Notes (Signed)
  Radiation Oncology         [phone] ________________________________  Name: Regina Watts MRN: [id number]  Date: 11/23/2014  DOB: 1961-04-14  VIRTUAL SIMULATION NOTE  NARRATIVE:  The patient underwent simulation today for ongoing radiation therapy.  The existing CT study set was employed for the purpose of virtual treatment planning.  The target and avoidance structures were reviewed and in some cases modified.  Treatment planning then occurred.  The radiation boost prescription was entered and confirmed.  A total of one complex treatment device was fabricated in the form of a cerrobend block to shape electron radiation around the target while maximally excluding nearby normal structures. I have requested : a special port plan.   PLAN:  This modified radiation beam arrangement is intended to continue the current radiation dose to an additional 10 Gy in 5 fractions for a total cumulative dose of 2 Gy.  ------------------------------------------------  Sheral Apley. Tammi Klippel, M.D.

## 2015-01-07 ENCOUNTER — Ambulatory Visit
Admission: RE | Admit: 2015-01-07 | Discharge: 2015-01-07 | Disposition: A | Discharge status: Home / Self Care | Payer: Medicare Other | Source: Ambulatory Visit | Attending: Oncology | Admitting: Oncology

## 2015-01-07 DIAGNOSIS — C50212 Malignant neoplasm of upper-inner quadrant of left female breast: Secondary | ICD-10-CM

## 2015-01-07 DIAGNOSIS — Z853 Personal history of malignant neoplasm of breast: Secondary | ICD-10-CM | POA: Diagnosis not present

## 2015-01-14 DIAGNOSIS — C50912 Malignant neoplasm of unspecified site of left female breast: Secondary | ICD-10-CM | POA: Diagnosis not present

## 2015-01-14 DIAGNOSIS — R7309 Other abnormal glucose: Secondary | ICD-10-CM | POA: Diagnosis not present

## 2015-01-14 DIAGNOSIS — G620 Drug-induced polyneuropathy: Secondary | ICD-10-CM | POA: Diagnosis not present

## 2015-01-14 DIAGNOSIS — F411 Generalized anxiety disorder: Secondary | ICD-10-CM | POA: Diagnosis not present

## 2015-01-14 DIAGNOSIS — E782 Mixed hyperlipidemia: Secondary | ICD-10-CM | POA: Diagnosis not present

## 2015-01-24 ENCOUNTER — Ambulatory Visit: Payer: Medicare Other | Admitting: Radiation Oncology

## 2015-01-25 ENCOUNTER — Ambulatory Visit: Payer: BLUE CROSS/BLUE SHIELD | Admitting: Radiation Oncology

## 2015-02-14 ENCOUNTER — Encounter: Payer: Self-pay | Admitting: Radiation Oncology

## 2015-02-14 ENCOUNTER — Ambulatory Visit
Admission: RE | Admit: 2015-02-14 | Discharge: 2015-02-14 | Disposition: A | Discharge status: Home / Self Care | Payer: BLUE CROSS/BLUE SHIELD | Source: Ambulatory Visit | Attending: Radiation Oncology | Admitting: Radiation Oncology

## 2015-02-14 VITALS — BP 104/70 | HR 81 | Resp 16 | Wt 160.0 lb

## 2015-02-14 DIAGNOSIS — C50212 Malignant neoplasm of upper-inner quadrant of left female breast: Secondary | ICD-10-CM

## 2015-02-14 NOTE — Progress Notes (Signed)
Radiation Oncology         [phone] ________________________________  Name: Regina Watts MRN: [id number]  Date: 02/14/2015  DOB: 12-31-1960  Follow-Up Visit Note  CC: Wenda Low, MD  Gordy Levan, MD  Diagnosis:  54 year old woman with stage TIc (1.8 cm) N0M0 estrogen receptor negative invasive ductal carcinoma of the left breast-stage I.  No diagnosis found.  Interval Since Last Radiation:  8  weeks  Narrative:  The patient returns today for routine follow-up.  Scheduled to follow up with Dr. Marko Plume on 4/28. Patient without complaints. Reports treated skin has returned to normal color and appearance. Denies breast pain or nipple discharge. Denies palpating any abnormalities in either breast. No lymphedema of either upper extremity noted. Full ROM of both upper extremities demonstrated. Denies pain. Weight and vitals stable.                              ALLERGIES:  is allergic to taxotere.  Meds: Current Outpatient Prescriptions  Medication Sig Dispense Refill  . pyridOXINE (VITAMIN B-6) 50 MG tablet Take 50 mg by mouth 2 (two) times daily.    . rosuvastatin (CRESTOR) 10 MG tablet Take 10 mg by mouth daily.    . sertraline (ZOLOFT) 100 MG tablet Take 1 tablet (100 mg total) by mouth daily. 30 tablet 3  . emollient (BIAFINE) cream Apply topically as needed.    . gabapentin (NEURONTIN) 100 MG capsule Take 1 capsule (100 mg total) by mouth at bedtime. Will make you Drowsy (Patient not taking: Reported on 02/14/2015) 30 capsule 2  . non-metallic deodorant (ALRA) MISC Apply 1 application topically daily as needed.     No current facility-administered medications for this encounter.    Physical Findings: The patient is in no acute distress. Patient is alert and oriented.  weight is 160 lb (72.576 kg). Her blood pressure is 104/70 and her pulse is 81. Her respiration is 16 and oxygen saturation is 100%. .  Skin: treated breast mild hyper pigmentation or tanning  No residual  desquamation or peeling No significant changes.  Lab Findings: Lab Results  Component Value Date   WBC 4.1 12/03/2014   WBC 3.0* 08/03/2014   HGB 13.6 12/03/2014   HGB 10.7* 08/03/2014   HCT 40.5 12/03/2014   HCT 31.4* 08/03/2014   PLT 138* 12/03/2014   PLT 155 08/03/2014    Lab Results  Component Value Date   NA 142 12/03/2014   NA 142 08/02/2014   K 4.3 12/03/2014   K 3.9 08/02/2014   CHLORIDE 107 12/03/2014   CO2 28 12/03/2014   CO2 23 08/02/2014   GLUCOSE 120 12/03/2014   GLUCOSE 132* 08/02/2014   BUN 13.2 12/03/2014   BUN 7 08/02/2014   CREATININE 0.8 12/03/2014   CREATININE 0.68 08/02/2014   BILITOT 0.66 12/03/2014   BILITOT 0.5 08/02/2014   ALKPHOS 91 12/03/2014   ALKPHOS 76 08/02/2014   AST 23 12/03/2014   AST 20 08/02/2014   ALT 30 12/03/2014   ALT 41* 08/02/2014   PROT 6.4 12/03/2014   PROT 5.5* 08/02/2014   ALBUMIN 4.0 12/03/2014   ALBUMIN 3.1* 08/02/2014   CALCIUM 9.3 12/03/2014   CALCIUM 8.7 08/02/2014   ANIONGAP 7 12/03/2014   ANIONGAP 10 08/02/2014    Radiographic Findings: No results found.  Impression:  The patient is recovering from the effects of radiation.  Recovering from radiation  Plan:  Fu in 6 month  _____________________________________  Sheral Apley Tammi Klippel, M.D.

## 2015-02-14 NOTE — Progress Notes (Signed)
Scheduled to follow up with Dr. Marko Plume on 4/28. Patient without complaints. Reports treated skin has returned to normal color and appearance. Denies breast pain or nipple discharge. Denies palpating any abnormalities in either breast. No lymphedema of either upper extremity noted. Full ROM of both upper extremities demonstrated. Denies pain. Weight and vitals stable.

## 2015-03-06 ENCOUNTER — Other Ambulatory Visit: Payer: Self-pay | Admitting: Oncology

## 2015-03-07 ENCOUNTER — Encounter: Payer: Self-pay | Admitting: Oncology

## 2015-03-07 ENCOUNTER — Telehealth: Payer: Self-pay | Admitting: Oncology

## 2015-03-07 ENCOUNTER — Other Ambulatory Visit (HOSPITAL_BASED_OUTPATIENT_CLINIC_OR_DEPARTMENT_OTHER): Payer: BLUE CROSS/BLUE SHIELD

## 2015-03-07 ENCOUNTER — Ambulatory Visit (HOSPITAL_BASED_OUTPATIENT_CLINIC_OR_DEPARTMENT_OTHER): Payer: BLUE CROSS/BLUE SHIELD | Admitting: Oncology

## 2015-03-07 VITALS — BP 111/65 | HR 61 | Temp 97.9°F | Resp 18 | Ht 67.0 in | Wt 159.4 lb

## 2015-03-07 DIAGNOSIS — Z1501 Genetic susceptibility to malignant neoplasm of breast: Secondary | ICD-10-CM

## 2015-03-07 DIAGNOSIS — H905 Unspecified sensorineural hearing loss: Secondary | ICD-10-CM

## 2015-03-07 DIAGNOSIS — G622 Polyneuropathy due to other toxic agents: Secondary | ICD-10-CM | POA: Diagnosis not present

## 2015-03-07 DIAGNOSIS — G620 Drug-induced polyneuropathy: Secondary | ICD-10-CM

## 2015-03-07 DIAGNOSIS — Z853 Personal history of malignant neoplasm of breast: Secondary | ICD-10-CM

## 2015-03-07 DIAGNOSIS — Z171 Estrogen receptor negative status [ER-]: Secondary | ICD-10-CM | POA: Diagnosis not present

## 2015-03-07 DIAGNOSIS — C50212 Malignant neoplasm of upper-inner quadrant of left female breast: Secondary | ICD-10-CM

## 2015-03-07 DIAGNOSIS — T451X5A Adverse effect of antineoplastic and immunosuppressive drugs, initial encounter: Secondary | ICD-10-CM

## 2015-03-07 DIAGNOSIS — Z1509 Genetic susceptibility to other malignant neoplasm: Secondary | ICD-10-CM

## 2015-03-07 LAB — COMPREHENSIVE METABOLIC PANEL (CC13)
ALBUMIN: 3.8 g/dL (ref 3.5–5.0)
ALK PHOS: 89 U/L (ref 40–150)
ALT: 30 U/L (ref 0–55)
AST: 19 U/L (ref 5–34)
Anion Gap: 11 mEq/L (ref 3–11)
BILIRUBIN TOTAL: 0.67 mg/dL (ref 0.20–1.20)
BUN: 11.5 mg/dL (ref 7.0–26.0)
CHLORIDE: 107 meq/L (ref 98–109)
CO2: 26 mEq/L (ref 22–29)
Calcium: 9.2 mg/dL (ref 8.4–10.4)
Creatinine: 0.8 mg/dL (ref 0.6–1.1)
EGFR: >90 mL/min/{1.73_m2} (ref >90)
GLUCOSE: 113 mg/dL (ref 70–140)
Potassium: 4.3 mEq/L (ref 3.5–5.1)
SODIUM: 144 meq/L (ref 136–145)
TOTAL PROTEIN: 6.3 g/dL — AB (ref 6.4–8.3)

## 2015-03-07 LAB — CBC WITH DIFFERENTIAL/PLATELET
BASO%: 0.6 % (ref 0.0–2.0)
Basophils Absolute: 0 10*3/uL (ref 0.0–0.1)
EOS%: 3.9 % (ref 0.0–7.0)
Eosinophils Absolute: 0.1 10*3/uL (ref 0.0–0.5)
HCT: 38.9 % (ref 34.8–46.6)
HGB: 13 g/dL (ref 11.6–15.9)
LYMPH%: 19.7 % (ref 14.0–49.7)
MCH: 30.6 pg (ref 25.1–34.0)
MCHC: 33.4 g/dL (ref 31.5–36.0)
MCV: 91.6 fL (ref 79.5–101.0)
MONO#: 0.3 10*3/uL (ref 0.1–0.9)
MONO%: 10 % (ref 0.0–14.0)
NEUT%: 65.8 % (ref 38.4–76.8)
NEUTROS ABS: 2.2 10*3/uL (ref 1.5–6.5)
Platelets: 158 10*3/uL (ref 145–400)
RBC: 4.24 10*6/uL (ref 3.70–5.45)
RDW: 13.6 % (ref 11.2–14.5)
WBC: 3.4 10*3/uL — ABNORMAL LOW (ref 3.9–10.3)
lymph#: 0.7 10*3/uL — ABNORMAL LOW (ref 0.9–3.3)

## 2015-03-07 NOTE — Progress Notes (Signed)
OFFICE PROGRESS NOTE   March 07, 2015   Physicians:David Cristy Hilts, Patsy Lager, Genevieve Norlander  INTERVAL HISTORY:   Patient is seen, together with sign language interpreter, in scheduled follow up of bilateral breast cancer, first premenopausal on right ER/PR negative and HER 2 positive, and most recent triple negative on left. She completed adjuvant chemotherapy for the T1cN0M0 right breast cancer 09-10-14 and radiation completed early Feb 2016.  She is now on observation, to see Dr Tammi Klippel again in Oct 2016, Dr Arvella Nigh 03-13-15, and Dr Lucia Gaskins also following. Last bilateral mammograms were at [facility] 01[facility]07-15, with no mammographic findings of concern (not tomo, however just scattered areas of fibroglandular density). She needs yearly breast MRI also.  Patient has felt well since she was here last, with gradual improvement in peripheral neuropathy, now mostly distal phalynx fingers bilaterally and distal 1/2 of toes. Energy and appetite are good. No pain, no SOB, no changes at breasts, no bleeding.   PAC was removed with site infection.  Flu vaccine done Genetics testing with Breast/Ovarian Cancer Gene Panel 01-2014 showed 3 variants of uncertain significance (ATM, [phone]; ATM, c.8674G>A and PMS2, C.1309C>T)  ONCOLOGIC HISTORY RIGHT BREAST CANCER: Right breast cancer diagnosed January 2002 when patient was age 54 and premenopausal, T2N0M0 (4cm grade 3 invasive ductal carcinoma, <25% high grade DCIS, S phase 8.6%), ER negative, PR negative, HER 2 positive. She had lumpectomy with 2 negative sentinel nodes, adjuvant chemotherapy by Dr Starr Sinclair with adriamycin cytoxan x 4 cycles from March 2002 thru May 2002 (total adriamycin dose 409 mg with weight 142, height 5'7", for total adriamycin 235 mg/m2), and local radiation (50.4 Gy with boost to 60.4) Gy Note she declined PAC and received the chemotherapy in 2002 by peripheral vein. I  followed her beginning Sept 2003 until last previous visit 07-2011. She declined TEACH trial with lapatinib in 2007 and 2008. Last breast MRI previously was 08-2011.  Patient had infertility treatments at [facility] in 2005, daughter born 02-01-2005. Menses had been very infrequent after chemo until the infertility treatment, then stopped after that pregnancy.  LEFT BREAST CANCER: Mammograms at Dr Sherran Needs office early 12-2013 had area questioned on left compared with last prior mammograms of 08-2012. Diagnostic left mammogram and [place] at [facility] on 01-01-14 showed a mass in left upper inner breast posteriorly 1.5 x 1.4 x 1.3 cm on [place], with no abnormal left axillary nodes identified. She had biopsy on 01-01-14 with pathology ([id number]) showing grade 1-2 invasive ductal carcinoma ER, PR and HER 2 negative with Ki 66%. She had breast MRI 01-15-2014 with solitary enhancing mass upper inner left breast 1.7 x 2.0 x 1.8 cm, right breast not remarkable, no abnormal appearing lymph nodes. She had CT CAP 01-29-14 then MRI liver 02-05-14 with no evidence of metastatic disease. Genetics testing with Breast/Ovarian Cancer Gene Panel 01-2014 did not reveal a pathogenic mutation in these genes, tho she did have 3 variants of uncertain significance (ATM, c.[id number]; ATM, c.8674G>A and PMS2, C.1309C>T). She had consultation with Dr Tammi Klippel prior to definitive surgery. She had lumpectomy and left axillary sentinel node biopsy by Dr Lucia Gaskins on 03-06-2014. Pathology [id number]) found 1.8 cm grade II invasive ductal carcinoma with one negative sentinel node and final margins negative. She had PAC placed by Dr Lucia Gaskins 03-06-14.Chemotherapy began with 2 cycles of dose dense AC on 03-30-14 and 04-13-14, which was maximal adriamycin based on previous chemo, then one treatment of cytoxan taxotere on 05-05-02 complicated  by severe rash and hand foot syndrome. The second planned cycle of cytoxan taxotere was not given. She began weekly taxol on  05-21-14, total 12 cycles completed 09-10-14 after weeks 4 and 7 delayed due to low counts, and week 8 delayed due to hospitalizations/ PAC infection. PAC was removed for infection 07-11-14.Radiation to left breast by Dr Tammi Klippel: Radiation treatment dates: 10/22/2014-2/2/201 Site/dose:  1. The whole left breast was treated to 50.4 Gy in 28 fractions of 1.8 Gy 2. The lumpectomy surgical site was treated to a total dose of 60.4 Gy with 5 additional fractions of 2 Gy     Review of systems as above, also: No GI complaints.  Remainder of 10 point Review of Systems negative.  Objective:  Vital signs in last 24 hours:  BP 111/65 mmHg  Pulse 61  Temp(Src) 97.9 F (36.6 C) (Oral)  Resp 18  Ht _0  (1.702 m)  Wt 159 lb 6.4 oz (72.303 kg)  BMI 24.96 kg/m2 Weight stable Alert, oriented and appropriate. Ambulatory without difficulty.  Hair has grown back curly  HEENT:PERRL, sclerae not icteric. Oral mucosa moist without lesions, posterior pharynx clear.  Neck supple. No JVD.  Lymphatics:no cervical,supraclavicular, axillary or inguinal adenopathy Resp: clear to auscultation bilaterally and normal percussion bilaterally Cardio: regular rate and rhythm. No gallop. GI: soft, nontender, not distended, no mass or organomegaly. Normally active bowel sounds. Surgical incision not remarkable. Musculoskeletal/ Extremities: without pitting edema, cords, tenderness Neuro: peripheral neuropathy distal fingers and toes. Congenital deafness Otherwise nonfocal. PSYCH appropriate mood and affect Skin without rash, ecchymosis, petechiae Breasts: Left with unremarkable lumpectomy scar, mild hyperpigmentation in radiation portal, no nipple findings, no dominant mass or skin changes of concern. Right with well healed surgical scar and otherwise without dominant mass, skin or nipple findings. Axillae benign. Portacath scar right chest wall deep but otherwise unremarkable.  Lab Results:  Results for  orders placed or performed in visit on 03/07/15  CBC with Differential  Result Value Ref Range   WBC 3.4 (L) 3.9 - 10.3 10e3/uL   NEUT# 2.2 1.5 - 6.5 10e3/uL   HGB 13.0 11.6 - 15.9 g/dL   HCT 38.9 34.8 - 46.6 %   Platelets 158 145 - 400 10e3/uL   MCV 91.6 79.5 - 101.0 fL   MCH 30.6 25.1 - 34.0 pg   MCHC 33.4 31.5 - 36.0 g/dL   RBC 4.24 3.70 - 5.45 10e6/uL   RDW 13.6 11.2 - 14.5 %   lymph# 0.7 (L) 0.9 - 3.3 10e3/uL   MONO# 0.3 0.1 - 0.9 10e3/uL   Eosinophils Absolute 0.1 0.0 - 0.5 10e3/uL   Basophils Absolute 0.0 0.0 - 0.1 10e3/uL   NEUT% 65.8 38.4 - 76.8 %   LYMPH% 19.7 14.0 - 49.7 %   MONO% 10.0 0.0 - 14.0 %   EOS% 3.9 0.0 - 7.0 %   BASO% 0.6 0.0 - 2.0 %  Comprehensive metabolic panel (Cmet) - [facility]  Result Value Ref Range   Sodium 144 136 - 145 mEq/L   Potassium 4.3 3.5 - 5.1 mEq/L   Chloride 107 98 - 109 mEq/L   CO2 26 22 - 29 mEq/L   Glucose 113 70 - 140 mg/dl   BUN 11.5 7.0 - 26.0 mg/dL   Creatinine 0.8 0.6 - 1.1 mg/dL   Total Bilirubin 0.67 0.20 - 1.20 mg/dL   Alkaline Phosphatase 89 40 - 150 U/L   AST 19 5 - 34 U/L   ALT 30 0 - 55 U/L  Total Protein 6.3 (L) 6.4 - 8.3 g/dL   Albumin 3.8 3.5 - 5.0 g/dL   Calcium 9.2 8.4 - 10.4 mg/dL   Anion Gap 11 3 - 11 mEq/L   EGFR >90 >90 ml/min/1.73 m2     Studies/Results:  DIGITAL DIAGNOSTIC BILATERAL MAMMOGRAM WITH CAD  COMPARISON: With priors.  ACR Breast Density Category b: There are scattered areas of fibroglandular density.  FINDINGS: Lumpectomy changes are seen in both breasts. There is no suspicious mass or malignant type microcalcifications.  Mammographic images were processed with CAD.  IMPRESSION: No evidence of malignancy in either breast.  RECOMMENDATION:  Bilateral diagnostic mammogram in 1 year is recommended.      Medications: I have reviewed the patient's current medications.  DISCUSSION: With personal history of bilateral high risk cancers and BRCA abnormalities, it is medically  necessary for her to have MRI scans of breasts yearly. Patient recalls some difficulty with insurance covering location of the last MRI, so that I have clearly mentioned that concern in the MRI order and have sent message also to preauthorization staff at [facility]. Patient understands that the MRI needs to be done before end of May, ie within 3 months of the mammograms.   Assessment/Plan: 1.Triple negative left breast cancer T1cN0 diagnosed 12-2013, treated with lumpectomy and sentinel node evaluation 03-06-14. Adjuvant chemotherapy completed 09-10-2014; RT completed ~ 12-11-14. Breast MRI before end of May, scheduling and preauth staff made aware of location concern. I will see her back in ~ 4 months with labs. 2.PAC removed 08-03-14 for site nfection. Wound healed. 3.history T2N0M0 right breast cancer 11-2000 at age 5 treated with lumpectomy, 2 sentinel nodes, chemotherapy and local radiation. 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 Symptoms gradually improving, not resolved 5.congenital deafness. Communicates by reading/ writing and sign language. 6.multiple genetic variants of unknown significance on genetic testing 01-2014 7.negative colonoscopy by Dr Earle Gell ~ Jan 2015 8.platelets back into normal range, ANC good and Hgb 13 today 9.gyn exam early May. With oncologic history, we appreciate Dr Radene Knee continuing to follow.  All questions answered and patient is in agreement with recommendations and plans. She knows to call prior to next scheduled visit if needed. Time spent 25 min including >50% counseling and coordination of care. Cc note to physicians outside of [facility]: Drs Lysle Rubens, Lucia Gaskins, [place], L. Lomax   Idris Edmundson Mamie Nick, MD   03/07/2015, 1:36 PM

## 2015-03-07 NOTE — Telephone Encounter (Signed)
gave and printed appt sched and avs fo rpt for May and Aug

## 2015-03-13 ENCOUNTER — Other Ambulatory Visit: Payer: Self-pay | Admitting: Obstetrics and Gynecology

## 2015-03-13 DIAGNOSIS — Z6825 Body mass index (BMI) 25.0-25.9, adult: Secondary | ICD-10-CM | POA: Diagnosis not present

## 2015-03-13 DIAGNOSIS — Z01419 Encounter for gynecological examination (general) (routine) without abnormal findings: Secondary | ICD-10-CM | POA: Diagnosis not present

## 2015-03-14 LAB — CYTOLOGY - PAP

## 2015-03-20 ENCOUNTER — Ambulatory Visit
Admission: RE | Admit: 2015-03-20 | Discharge: 2015-03-20 | Disposition: A | Discharge status: Home / Self Care | Payer: BLUE CROSS/BLUE SHIELD | Source: Ambulatory Visit | Attending: Oncology | Admitting: Oncology

## 2015-03-20 DIAGNOSIS — C50212 Malignant neoplasm of upper-inner quadrant of left female breast: Secondary | ICD-10-CM

## 2015-03-20 DIAGNOSIS — Z853 Personal history of malignant neoplasm of breast: Secondary | ICD-10-CM

## 2015-03-20 DIAGNOSIS — R928 Other abnormal and inconclusive findings on diagnostic imaging of breast: Secondary | ICD-10-CM | POA: Diagnosis not present

## 2015-03-20 DIAGNOSIS — Z1501 Genetic susceptibility to malignant neoplasm of breast: Secondary | ICD-10-CM | POA: Diagnosis not present

## 2015-03-20 DIAGNOSIS — Z1509 Genetic susceptibility to other malignant neoplasm: Secondary | ICD-10-CM

## 2015-03-20 MED ORDER — GADOBENATE DIMEGLUMINE 529 MG/ML IV SOLN
14.0000 mL | Freq: Once | INTRAVENOUS | Status: AC | PRN
  Administered 2015-03-20: 14 mL via INTRAVENOUS

## 2015-03-21 ENCOUNTER — Telehealth: Payer: Self-pay | Admitting: *Deleted

## 2015-03-21 NOTE — Telephone Encounter (Signed)
Voicemail left for patient to return call.

## 2015-03-21 NOTE — Telephone Encounter (Signed)
-----   Message from Gordy Levan, MD sent at 03/21/2015  8:28 AM EDT ----- Labs seen and need follow up: please let Mira know breast MRI was fine. OK to give message to husband, or thru the translator system that [facility] uses for phone, or can send a letter    thanks

## 2015-03-22 NOTE — Telephone Encounter (Signed)
Called patient and let her know results as noted below by Dr. Marko Plume. Patient appreciative of call.

## 2015-06-08 ENCOUNTER — Other Ambulatory Visit: Payer: Self-pay | Admitting: Oncology

## 2015-06-17 ENCOUNTER — Ambulatory Visit (HOSPITAL_BASED_OUTPATIENT_CLINIC_OR_DEPARTMENT_OTHER): Payer: BLUE CROSS/BLUE SHIELD | Admitting: Oncology

## 2015-06-17 ENCOUNTER — Encounter: Payer: Self-pay | Admitting: Oncology

## 2015-06-17 ENCOUNTER — Other Ambulatory Visit (HOSPITAL_BASED_OUTPATIENT_CLINIC_OR_DEPARTMENT_OTHER): Payer: BLUE CROSS/BLUE SHIELD

## 2015-06-17 ENCOUNTER — Telehealth: Payer: Self-pay | Admitting: Oncology

## 2015-06-17 VITALS — BP 125/75 | HR 71 | Temp 98.3°F | Resp 18 | Ht 67.0 in | Wt 165.3 lb

## 2015-06-17 DIAGNOSIS — G620 Drug-induced polyneuropathy: Secondary | ICD-10-CM

## 2015-06-17 DIAGNOSIS — G622 Polyneuropathy due to other toxic agents: Secondary | ICD-10-CM

## 2015-06-17 DIAGNOSIS — T451X5A Adverse effect of antineoplastic and immunosuppressive drugs, initial encounter: Secondary | ICD-10-CM

## 2015-06-17 DIAGNOSIS — Z853 Personal history of malignant neoplasm of breast: Secondary | ICD-10-CM

## 2015-06-17 DIAGNOSIS — C50212 Malignant neoplasm of upper-inner quadrant of left female breast: Secondary | ICD-10-CM

## 2015-06-17 DIAGNOSIS — D696 Thrombocytopenia, unspecified: Secondary | ICD-10-CM

## 2015-06-17 DIAGNOSIS — H905 Unspecified sensorineural hearing loss: Secondary | ICD-10-CM

## 2015-06-17 LAB — CBC WITH DIFFERENTIAL/PLATELET
BASO%: 0.2 % (ref 0.0–2.0)
BASOS ABS: 0 10*3/uL (ref 0.0–0.1)
EOS ABS: 0.1 10*3/uL (ref 0.0–0.5)
EOS%: 3.4 % (ref 0.0–7.0)
HEMATOCRIT: 39.3 % (ref 34.8–46.6)
HEMOGLOBIN: 13.7 g/dL (ref 11.6–15.9)
LYMPH#: 0.9 10*3/uL (ref 0.9–3.3)
LYMPH%: 22.7 % (ref 14.0–49.7)
MCH: 31.7 pg (ref 25.1–34.0)
MCHC: 34.9 g/dL (ref 31.5–36.0)
MCV: 91 fL (ref 79.5–101.0)
MONO#: 0.4 10*3/uL (ref 0.1–0.9)
MONO%: 9 % (ref 0.0–14.0)
NEUT%: 64.7 % (ref 38.4–76.8)
NEUTROS ABS: 2.6 10*3/uL (ref 1.5–6.5)
PLATELETS: 122 10*3/uL — AB (ref 145–400)
RBC: 4.32 10*6/uL (ref 3.70–5.45)
RDW: 13 % (ref 11.2–14.5)
WBC: 4.1 10*3/uL (ref 3.9–10.3)

## 2015-06-17 LAB — COMPREHENSIVE METABOLIC PANEL (CC13)
ALT: 37 U/L (ref 0–55)
ANION GAP: 5 meq/L (ref 3–11)
AST: 25 U/L (ref 5–34)
Albumin: 3.9 g/dL (ref 3.5–5.0)
Alkaline Phosphatase: 105 U/L (ref 40–150)
BUN: 10.6 mg/dL (ref 7.0–26.0)
CO2: 29 meq/L (ref 22–29)
Calcium: 9.1 mg/dL (ref 8.4–10.4)
Chloride: 110 mEq/L — ABNORMAL HIGH (ref 98–109)
Creatinine: 0.8 mg/dL (ref 0.6–1.1)
EGFR: 89 mL/min/{1.73_m2} — ABNORMAL LOW (ref >90)
Glucose: 135 mg/dl (ref 70–140)
Potassium: 4.4 mEq/L (ref 3.5–5.1)
Sodium: 144 mEq/L (ref 136–145)
Total Bilirubin: 0.5 mg/dL (ref 0.20–1.20)
Total Protein: 6.4 g/dL (ref 6.4–8.3)

## 2015-06-17 NOTE — Progress Notes (Signed)
OFFICE PROGRESS NOTE   June 17, 2015   Physicians:David Hanley Hays, Genevieve Norlander  Gyn office note 03-13-15 requested and received, will be scanned into this EMR.  INTERVAL HISTORY:   Patient is seen, together with interpreter, in scheduled follow up of bilateral breast cancer, most recently triple negative on left diagnosed 12-2013, on observation since completing adjuvant chemotherapy 09-2014. She had breast MRI 03-20-15 with no findings of concern, and unremarkable gyn exam by Dr Radene Knee 03-13-15.  [facility]. She will see Dr Tammi Klippel 08-15-15; she continues follow up with Dr Lucia Gaskins.   Patient has felt very well since she was here last. Energy is good, no noted changes in breasts, no pain, good appetite. Peripheral neuropathy from chemo has improved in hands and feet, now just in tips of fingers and tips of toes bilaterally.  She denies any recent viral URI or other illness, and denies any bleeding or unusual bruising.     PAC was removed with site infection.  Flu vaccine done Genetics testing with Breast/Ovarian Cancer Gene Panel 01-2014 showed 3 variants of uncertain significance (ATM, [phone]; ATM, c.8674G>A and PMS2, C.1309C>T)  ONCOLOGIC HISTORY RIGHT BREAST CANCER: Right breast cancer diagnosed January 2002 when patient was age 47 and premenopausal, T2N0M0 (4cm grade 3 invasive ductal carcinoma, <25% high grade DCIS, S phase 8.6%), ER negative, PR negative, HER 2 positive. She had lumpectomy with 2 negative sentinel nodes, adjuvant chemotherapy by Dr Starr Sinclair with adriamycin cytoxan x 4 cycles from March 2002 thru May 2002 (total adriamycin dose 409 mg with weight 142, height 5'7", for total adriamycin 235 mg/m2), and local radiation (50.4 Gy with boost to 60.4) Gy Note she declined PAC and received the chemotherapy in 2002 by peripheral vein. I followed her beginning Sept 2003  until last previous visit 07-2011. She declined TEACH trial with lapatinib in 2007 and 2008. Last breast MRI previously was 08-2011.  Patient had infertility treatments at [facility] in 2005, daughter born 02-01-2005. Menses had been very infrequent after chemo until the infertility treatment, then stopped after that pregnancy.  LEFT BREAST CANCER: Mammograms at Dr Sherran Needs office early 12-2013 had area questioned on left compared with last prior mammograms of 08-2012. Diagnostic left mammogram and [place] at [facility] on 01-01-14 showed a mass in left upper inner breast posteriorly 1.5 x 1.4 x 1.3 cm on [place], with no abnormal left axillary nodes identified. She had biopsy on 01-01-14 with pathology ([id number]) showing grade 1-2 invasive ductal carcinoma ER, PR and HER 2 negative with Ki 66%. She had breast MRI 01-15-2014 with solitary enhancing mass upper inner left breast 1.7 x 2.0 x 1.8 cm, right breast not remarkable, no abnormal appearing lymph nodes. She had CT CAP 01-29-14 then MRI liver 02-05-14 with no evidence of metastatic disease. Genetics testing with Breast/Ovarian Cancer Gene Panel 01-2014 did not reveal a pathogenic mutation in these genes, tho she did have 3 variants of uncertain significance (ATM, c.[id number]; ATM, c.8674G>A and PMS2, C.1309C>T). She had consultation with Dr Tammi Klippel prior to definitive surgery. She had lumpectomy and left axillary sentinel node biopsy by Dr Lucia Gaskins on 03-06-2014. Pathology [id number]) found 1.8 cm grade II invasive ductal carcinoma with one negative sentinel node and final margins negative. She had PAC placed by Dr Lucia Gaskins 03-06-14.Chemotherapy began with 2 cycles of dose dense AC on 03-30-14 and 04-13-14, which was maximal adriamycin based on previous chemo, then one treatment of cytoxan taxotere on  12-06-76 complicated by severe rash and hand foot syndrome. The second planned cycle of cytoxan taxotere was not given. She began weekly taxol on 05-21-14, total 12 cycles completed  09-10-14 after weeks 4 and 7 delayed due to low counts, and week 8 delayed due to hospitalizations/ PAC infection. PAC was removed for infection 07-11-14.Radiation to left breast by Dr Tammi Klippel: Radiation treatment dates: 10/22/2014-2/2/201 Site/dose:  1. The whole left breast was treated to 50.4 Gy in 28 fractions of 1.8 Gy 2. The lumpectomy surgical site was treated to a total dose of 60.4 Gy with 5 additional fractions of 2 Gy      Review of systems as above, also: No respiratory or GI symptoms. No swelling LE. Remainder of 10 point Review of Systems negative.  Objective:  Vital signs in last 24 hours: Weight up 6 lbs to 165, BP 125/75, HR 71 regular, respirations 18 not labored, temp 98.3  Alert, oriented and appropriate. Ambulatory without difficulty.  Hair curly since chemo.   HEENT:PERRL, sclerae not icteric. Oral mucosa moist without lesions, posterior pharynx clear.  Neck supple. No JVD.  Lymphatics:no cervical,supraclavicular, axillary adenopathy Resp: clear to auscultation bilaterally and normal percussion bilaterally Cardio: regular rate and rhythm. No gallop. GI: soft, nontender, not distended, no mass or organomegaly. Normally active bowel sounds. Spleen not appreciated. Musculoskeletal/ Extremities: without pitting edema, cords, tenderness Neuro:  peripheral neuropathy tips of fingers and tips of toes. Congenital deafness, otherwise nonfocal Skin without rash, ecchymosis, petechiae Breasts: Right with well healed incision across upper breast, without dominant mass, skin or nipple findings. Left with superior surgical scar well healed, no tenderness, no dominant mass, slight lymphedema in inferior breast no erythema. Axillae benign. Scar from previous PAC right anterior chest, not tender.   Lab Results:  Results for orders placed or performed in visit on 06/17/15  CBC with Differential  Result Value Ref Range   WBC 4.1 3.9 - 10.3 10e3/uL   NEUT# 2.6 1.5 - 6.5  10e3/uL   HGB 13.7 11.6 - 15.9 g/dL   HCT 39.3 34.8 - 46.6 %   Platelets 122 (L) 145 - 400 10e3/uL   MCV 91.0 79.5 - 101.0 fL   MCH 31.7 25.1 - 34.0 pg   MCHC 34.9 31.5 - 36.0 g/dL   RBC 4.32 3.70 - 5.45 10e6/uL   RDW 13.0 11.2 - 14.5 %   lymph# 0.9 0.9 - 3.3 10e3/uL   MONO# 0.4 0.1 - 0.9 10e3/uL   Eosinophils Absolute 0.1 0.0 - 0.5 10e3/uL   Basophils Absolute 0.0 0.0 - 0.1 10e3/uL   NEUT% 64.7 38.4 - 76.8 %   LYMPH% 22.7 14.0 - 49.7 %   MONO% 9.0 0.0 - 14.0 %   EOS% 3.4 0.0 - 7.0 %   BASO% 0.2 0.0 - 2.0 %    CMET available after visit normal with exception of chloride 110   Studies/Results: Pap 03-13-15 no findings of concern  CLINICAL DATA: BRCA positive. Personal history of bilateral breast cancer. Right breast lumpectomy in 2002, left breast lumpectomy in 2015.  LABS: Does not apply     EXAM: BILATERAL BREAST MRI WITH AND WITHOUT CONTRAST  03-20-15  TECHNIQUE: Multiplanar, multisequence MR images of both breasts were obtained prior to and following the intravenous administration of 14 ml of MultiHance.  THREE-DIMENSIONAL MR IMAGE RENDERING ON INDEPENDENT WORKSTATION:  Three-dimensional MR images were rendered by post-processing of the original MR data on an independent workstation. The three-dimensional MR images were interpreted, and findings are reported  in the following complete MRI report for this study. Three dimensional images were evaluated at the independent DynaCad workstation  COMPARISON: Previous exam(s).  FINDINGS: Breast composition: b. Scattered fibroglandular tissue.  Background parenchymal enhancement: Mild.  Right breast: Post lumpectomy changes are identified. Focal scar is identified in the superior medial right breast, possibly from the prior right central line.  Left breast: Post lumpectomy changes identified.  Lymph nodes: No abnormal appearing lymph nodes.  Ancillary findings: None.  IMPRESSION: Benign  findings.  RECOMMENDATION: Screening breast MRI in 1 year.  BI-RADS CATEGORY 2: Benign.   Medications: I have reviewed the patient's current medications.  DISCUSSION: Clinically doing well and we are pleased that the taxane peripheral neuropathy is improving. Discussed slightly low platelets, which she had also on a CBC in 11-2014, then subsequently improved. Will repeat CBC in one month and patient understands that she should let us know if any bleeding or unusual bleeding prior to next scheduled visit. She is to see Dr Tammi Klippel in October, I will see her in ~3- 4 months with labs.   Assessment/Plan:  1.Triple negative left breast cancer T1cN0 diagnosed 12-2013, treated with lumpectomy and sentinel node evaluation 03-06-14. Adjuvant chemotherapy completed 09-10-2014; RT completed ~ 12-11-14. Breast MRI without findings of concern 03-20-15.  I will see her back in 3- 4 months with labs. 2.history T2N0M0 right breast cancer 11-2000 at age 10 treated with lumpectomy, 2 sentinel nodes, chemotherapy and local radiation. This was ER PR negative and HER 2 + (prior to herceptin use)  3.Platelets slightly low today without clinical findings. Will recheck CBC morphology in ~ 4 weeks. Hgb and WBC fine.  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 Communicates by reading/ writing and sign language.  6.multiple genetic variants of unknown significance on genetic testing 01-2014  7.negative colonoscopy by Dr Earle Gell ~ Jan 2015 8.gyn exam early May and appropriate to continue yearly with her oncologic history. Dr Sherran Needs help appreciated.     All questions answered to her satisfaction with assistance of interpreter. Message to RN to follow up counts in 4 weeks. Time spent 25 min including >50% counseling and coordination of care. CC Drs Lysle Rubens, Lucia Gaskins, [place]  Gordy Levan, MD   06/17/2015, 9:44 AM

## 2015-06-17 NOTE — Telephone Encounter (Signed)
Gave avs & calendar for September & November

## 2015-07-16 ENCOUNTER — Other Ambulatory Visit (HOSPITAL_BASED_OUTPATIENT_CLINIC_OR_DEPARTMENT_OTHER): Payer: BLUE CROSS/BLUE SHIELD

## 2015-07-16 ENCOUNTER — Telehealth: Payer: Self-pay

## 2015-07-16 DIAGNOSIS — C50212 Malignant neoplasm of upper-inner quadrant of left female breast: Secondary | ICD-10-CM

## 2015-07-16 LAB — CBC WITH DIFFERENTIAL/PLATELET
BASO%: 0.9 % (ref 0.0–2.0)
Basophils Absolute: 0 10*3/uL (ref 0.0–0.1)
EOS%: 3.5 % (ref 0.0–7.0)
Eosinophils Absolute: 0.1 10*3/uL (ref 0.0–0.5)
HEMATOCRIT: 40.4 % (ref 34.8–46.6)
HGB: 13.8 g/dL (ref 11.6–15.9)
LYMPH%: 22.4 % (ref 14.0–49.7)
MCH: 31.8 pg (ref 25.1–34.0)
MCHC: 34.2 g/dL (ref 31.5–36.0)
MCV: 92.8 fL (ref 79.5–101.0)
MONO#: 0.3 10*3/uL (ref 0.1–0.9)
MONO%: 9.3 % (ref 0.0–14.0)
NEUT%: 63.9 % (ref 38.4–76.8)
NEUTROS ABS: 2.3 10*3/uL (ref 1.5–6.5)
Platelets: 156 10*3/uL (ref 145–400)
RBC: 4.35 10*6/uL (ref 3.70–5.45)
RDW: 13.2 % (ref 11.2–14.5)
WBC: 3.6 10*3/uL — AB (ref 3.9–10.3)
lymph#: 0.8 10*3/uL — ABNORMAL LOW (ref 0.9–3.3)

## 2015-07-16 LAB — MORPHOLOGY: PLT EST: ADEQUATE

## 2015-07-16 NOTE — Telephone Encounter (Signed)
Left message for Regina Watts stating that her counts were fine today per Dr. Marko Plume.  Keep next appointment 10-07-15 as scheduled.

## 2015-07-16 NOTE — Telephone Encounter (Signed)
-----   Message from Gordy Levan, MD sent at 06/17/2015 11:05 AM EDT ----- For CBC 4 weeks to follow up slightly low platelets. I need to be sure to see it and will need to let patient know results   thanks

## 2015-07-24 DIAGNOSIS — Z0000 Encounter for general adult medical examination without abnormal findings: Secondary | ICD-10-CM | POA: Diagnosis not present

## 2015-07-24 DIAGNOSIS — Z1389 Encounter for screening for other disorder: Secondary | ICD-10-CM | POA: Diagnosis not present

## 2015-07-24 DIAGNOSIS — C50911 Malignant neoplasm of unspecified site of right female breast: Secondary | ICD-10-CM | POA: Diagnosis not present

## 2015-07-24 DIAGNOSIS — Z23 Encounter for immunization: Secondary | ICD-10-CM | POA: Diagnosis not present

## 2015-07-24 DIAGNOSIS — C50912 Malignant neoplasm of unspecified site of left female breast: Secondary | ICD-10-CM | POA: Diagnosis not present

## 2015-07-24 DIAGNOSIS — G620 Drug-induced polyneuropathy: Secondary | ICD-10-CM | POA: Diagnosis not present

## 2015-07-24 DIAGNOSIS — R7309 Other abnormal glucose: Secondary | ICD-10-CM | POA: Diagnosis not present

## 2015-07-24 DIAGNOSIS — F411 Generalized anxiety disorder: Secondary | ICD-10-CM | POA: Diagnosis not present

## 2015-07-24 DIAGNOSIS — E782 Mixed hyperlipidemia: Secondary | ICD-10-CM | POA: Diagnosis not present

## 2015-08-15 ENCOUNTER — Encounter: Payer: Self-pay | Admitting: Radiation Oncology

## 2015-08-15 ENCOUNTER — Ambulatory Visit
Admission: RE | Admit: 2015-08-15 | Discharge: 2015-08-15 | Disposition: A | Discharge status: Home / Self Care | Payer: Medicare Other | Source: Ambulatory Visit | Attending: Radiation Oncology | Admitting: Radiation Oncology

## 2015-08-15 VITALS — BP 111/70 | HR 78 | Resp 16 | Wt 167.4 lb

## 2015-08-15 DIAGNOSIS — C50212 Malignant neoplasm of upper-inner quadrant of left female breast: Secondary | ICD-10-CM | POA: Diagnosis not present

## 2015-08-15 NOTE — Progress Notes (Addendum)
Weight and vitals stable. Denies pain. Denies fatigue. Denies palpating any breast abnormalities. Denies nipple discharge. No lymphedema of either upper extremity noted. Demonstrates full ROM of both upper extremities. Patient without complaints. Patient excited to go on cruise with her family in a few weeks. Confirms skin within treatment field has returned to normal color and appearance.  BP 111/70 mmHg  Pulse 78  Resp 16  Wt 167 lb 6.4 oz (75.932 kg)  SpO2 100% Wt Readings from Last 3 Encounters:  08/15/15 167 lb 6.4 oz (75.932 kg)  06/17/15 165 lb 4.8 oz (74.98 kg)  03/07/15 159 lb 6.4 oz (72.303 kg)

## 2015-08-15 NOTE — Progress Notes (Signed)
Radiation Oncology         [phone] ________________________________  Name: [name] MRN: [id number]  Date: 08/15/2015  DOB: 1961/09/22  Follow-Up Visit Note  CC: Wenda Low, MD  Gordy Levan, MD  Diagnosis:  54 year old woman with stage TIc (1.8 cm) N0M0 estrogen receptor negative invasive ductal carcinoma of the left breast-stage I.    ICD-9-CM ICD-10-CM   1. Breast cancer of upper-inner quadrant of left female breast ([place]) 174.2 C50.212     Interval Since Last Radiation:  8  months   Radiation Dates ranged from 10/22/2014 to 12/11/2014. The site and dose included the whole left breast was treated to 50.4 Gy in 28 fractions of 1.8 Gy and the lumpectomy surgical site was treated to a total dose of 60.4 Gy with 5 additional fractions of 2 Gy. The beams and energy used included the whole left breast was treated using tangential 6 megavolt x-ray beams with one parent field from the medial and lateral gantry positions as well as 1 electronic compensated field from the medial lateral directions to provide a homogeneous coverage of the breast without hot spots and the lumpectomy surgical site was treated comprehensively including the surgical scar and lumpectomy cavity internally using 12 MeV electrons.  Narrative:  The patient returns today for her routine follow-up appointment with radiation oncology. An inturpreter was provided to aid her communication with sign language. Her weight and vitals are currently stable. She denies symptoms of pain, fatigue, palpating any breast abnormalities, or nipple discharge. She demonstrates full ROM of both upper extremities. She shared that she is excited to go on cruise with her family in a few weeks.                              ALLERGIES:  is allergic to taxotere.  Meds: Current Outpatient Prescriptions  Medication Sig Dispense Refill  . atorvastatin (LIPITOR) 40 MG tablet Take 40 mg by mouth daily.  3  . pyridOXINE (VITAMIN B-6) 50  MG tablet Take 50 mg by mouth 2 (two) times daily.    . sertraline (ZOLOFT) 100 MG tablet Take 1 tablet (100 mg total) by mouth daily. 30 tablet 3   No current facility-administered medications for this encounter.    Physical Findings: The patient is in no acute distress. Patient is alert and oriented. There is no significant changes to the status of overall health to be noted at this time.  weight is 167 lb 6.4 oz (75.932 kg). Her blood pressure is 111/70 and her pulse is 78. Her respiration is 16 and oxygen saturation is 100%.   Breasts:  good cosmetic result, no palpable masses, some tenderness related to fibrosis in the pectoralis muscles. No evidence of recurrence of disease.  Lab Findings: Lab Results  Component Value Date   WBC 3.6* 07/16/2015   WBC 3.0* 08/03/2014   HGB 13.8 07/16/2015   HGB 10.7* 08/03/2014   HCT 40.4 07/16/2015   HCT 31.4* 08/03/2014   PLT 156 07/16/2015   PLT 155 08/03/2014    Lab Results  Component Value Date   NA 144 06/17/2015   NA 142 08/02/2014   K 4.4 06/17/2015   K 3.9 08/02/2014   CHLORIDE 110* 06/17/2015   CO2 29 06/17/2015   CO2 23 08/02/2014   GLUCOSE 135 06/17/2015   GLUCOSE 132* 08/02/2014   BUN 10.6 06/17/2015   BUN 7 08/02/2014   CREATININE 0.8 06/17/2015  CREATININE 0.68 08/02/2014   BILITOT 0.50 06/17/2015   BILITOT 0.5 08/02/2014   ALKPHOS 105 06/17/2015   ALKPHOS 76 08/02/2014   AST 25 06/17/2015   AST 20 08/02/2014   ALT 37 06/17/2015   ALT 41* 08/02/2014   PROT 6.4 06/17/2015   PROT 5.5* 08/02/2014   ALBUMIN 3.9 06/17/2015   ALBUMIN 3.1* 08/02/2014   CALCIUM 9.1 06/17/2015   CALCIUM 8.7 08/02/2014   ANIONGAP 5 06/17/2015   ANIONGAP 10 08/02/2014    Radiographic Findings: No results found.  Impression: Dessire Watts is a 54 year old woman presenting to the clinic in regards to her stage TIc (1.8 cm) N0M0 estrogen receptor negative invasive ductal carcinoma of the left breast-stage I. She communicated very well  with her sign language interpreter. The patient is recovering from the effects of radiation. The patient is appropriately managing reported symptoms. She understands the results of her most recent mammogram in February of 2016 and MRI in May of 2016. The patient understands the importance of completing annual mammograms in regards to the best management of her recovery. The patient understands that she can access her appointments and medical records via [facility].   Plan:  Healthy methods of management in regards to reported symptoms were reviewed in detail. She is aware of her follow-up with Dr. Marko Plume in medical oncology to take place periodically, as schduled. She is to complete an annual mammogram, moving forward. The patient is advised of her follow-up appointment to take place with radiation oncology on an as needed basis. All vocalized questions and concerns have been addressed. If the patient develops any further questions or concerns in regards to her treatment and recovery, she has been encouraged to contact Dr. Tammi Klippel, MD.   This document serves as a record of services personally performed by Tyler Pita, MD. It was created on his behalf by Lenn Cal, a trained medical scribe. The creation of this record is based on the scribe's personal observations and the provider's statements to them. This document has been checked and approved by the attending provider.   _____________________________________  Sheral Apley. Tammi Klippel, M.D.

## 2015-09-23 ENCOUNTER — Encounter: Payer: Self-pay | Admitting: Genetic Counselor

## 2015-09-23 DIAGNOSIS — Z1379 Encounter for other screening for genetic and chromosomal anomalies: Secondary | ICD-10-CM | POA: Insufficient documentation

## 2015-10-02 ENCOUNTER — Other Ambulatory Visit: Payer: Self-pay

## 2015-10-02 DIAGNOSIS — C50212 Malignant neoplasm of upper-inner quadrant of left female breast: Secondary | ICD-10-CM

## 2015-10-06 ENCOUNTER — Other Ambulatory Visit: Payer: Self-pay | Admitting: Oncology

## 2015-10-07 ENCOUNTER — Encounter: Payer: Self-pay | Admitting: Oncology

## 2015-10-07 ENCOUNTER — Other Ambulatory Visit (HOSPITAL_BASED_OUTPATIENT_CLINIC_OR_DEPARTMENT_OTHER): Payer: BLUE CROSS/BLUE SHIELD

## 2015-10-07 ENCOUNTER — Ambulatory Visit (HOSPITAL_BASED_OUTPATIENT_CLINIC_OR_DEPARTMENT_OTHER): Payer: BLUE CROSS/BLUE SHIELD | Admitting: Oncology

## 2015-10-07 VITALS — BP 131/75 | HR 79 | Temp 98.1°F | Resp 18 | Ht 67.0 in | Wt 177.4 lb

## 2015-10-07 DIAGNOSIS — D696 Thrombocytopenia, unspecified: Secondary | ICD-10-CM | POA: Diagnosis not present

## 2015-10-07 DIAGNOSIS — C50212 Malignant neoplasm of upper-inner quadrant of left female breast: Secondary | ICD-10-CM | POA: Diagnosis not present

## 2015-10-07 DIAGNOSIS — Z853 Personal history of malignant neoplasm of breast: Secondary | ICD-10-CM | POA: Diagnosis not present

## 2015-10-07 DIAGNOSIS — G620 Drug-induced polyneuropathy: Secondary | ICD-10-CM | POA: Diagnosis not present

## 2015-10-07 DIAGNOSIS — I890 Lymphedema, not elsewhere classified: Secondary | ICD-10-CM | POA: Diagnosis not present

## 2015-10-07 DIAGNOSIS — T451X5A Adverse effect of antineoplastic and immunosuppressive drugs, initial encounter: Secondary | ICD-10-CM

## 2015-10-07 LAB — COMPREHENSIVE METABOLIC PANEL (CC13)
ALBUMIN: 3.8 g/dL (ref 3.5–5.0)
ALK PHOS: 104 U/L (ref 40–150)
ALT: 43 U/L (ref 0–55)
AST: 23 U/L (ref 5–34)
Anion Gap: 7 mEq/L (ref 3–11)
BUN: 14.3 mg/dL (ref 7.0–26.0)
CO2: 27 mEq/L (ref 22–29)
Calcium: 9.5 mg/dL (ref 8.4–10.4)
Chloride: 107 mEq/L (ref 98–109)
Creatinine: 0.9 mg/dL (ref 0.6–1.1)
EGFR: 79 mL/min/{1.73_m2} — AB (ref >90)
Glucose: 146 mg/dl — ABNORMAL HIGH (ref 70–140)
Potassium: 4.5 mEq/L (ref 3.5–5.1)
Sodium: 141 mEq/L (ref 136–145)
TOTAL PROTEIN: 6.9 g/dL (ref 6.4–8.3)
Total Bilirubin: 0.59 mg/dL (ref 0.20–1.20)

## 2015-10-07 LAB — CBC WITH DIFFERENTIAL/PLATELET
BASO%: 0.5 % (ref 0.0–2.0)
BASOS ABS: 0 10*3/uL (ref 0.0–0.1)
EOS ABS: 0.2 10*3/uL (ref 0.0–0.5)
EOS%: 3.8 % (ref 0.0–7.0)
HEMATOCRIT: 41 % (ref 34.8–46.6)
HEMOGLOBIN: 14.1 g/dL (ref 11.6–15.9)
LYMPH#: 1.1 10*3/uL (ref 0.9–3.3)
LYMPH%: 27.4 % (ref 14.0–49.7)
MCH: 31.9 pg (ref 25.1–34.0)
MCHC: 34.4 g/dL (ref 31.5–36.0)
MCV: 92.8 fL (ref 79.5–101.0)
MONO#: 0.3 10*3/uL (ref 0.1–0.9)
MONO%: 7.9 % (ref 0.0–14.0)
NEUT#: 2.4 10*3/uL (ref 1.5–6.5)
NEUT%: 60.4 % (ref 38.4–76.8)
Platelets: 133 10*3/uL — ABNORMAL LOW (ref 145–400)
RBC: 4.42 10*6/uL (ref 3.70–5.45)
RDW: 12.9 % (ref 11.2–14.5)
WBC: 3.9 10*3/uL (ref 3.9–10.3)

## 2015-10-07 NOTE — Progress Notes (Signed)
OFFICE PROGRESS NOTE   October 07, 2015   Physicians:David Cristy Hilts, Patsy Lager, Genevieve Norlander  INTERVAL HISTORY:   Patient is seen, together with sign language interpreter and student, in scheduled follow up of bilateral breast cancer, most recently left triple negative 12-2013. She is on observation since completing adjuvant chemotherapy 09-2014. She saw Dr Lysle Rubens in Sept with flu vaccine at his office and Dr Tammi Klippel 08-15-15 with prn follow up with radiation oncology now. She continues follow up with Dr Lucia Gaskins. Most recent diagnostic bilateral mammograms were at [facility] 01-07-15 Breast MRI 03-20-15 Gyn exam by Dr Radene Knee May 2016   [place] has been well since she was here last, particularly no recent viral infections. . Chemo related peripheral neuropathy in feet seems slightly improved, but persists in tips of fingers where it is worse when exposed to hot water as with washing dishes. She notices more asymmetry of breasts, without discomfort. Appetite is good, drinking lots of sweet tea, and is not exercising. No respiratory or GI complaints. No swelling extremities. No bleeding or unusual bruising. Good energy.  Remainder of 10 point Review of System negative.  No PAC Flu vaccine done by PCP fall 2016 3 variants of uncertain significance by Breast Ovarian panel 01-2014  (ATM, [phone]; ATM, c.8674G>A and PMS2, C.1309C>T)  ONCOLOGIC HISTORY RIGHT BREAST CANCER: Right breast cancer diagnosed January 2002 when patient was age 73 and premenopausal, T2N0M0 (4cm grade 3 invasive ductal carcinoma, <25% high grade DCIS, S phase 8.6%), ER negative, PR negative, HER 2 positive. She had lumpectomy with 2 negative sentinel nodes, adjuvant chemotherapy by Dr Starr Sinclair with adriamycin cytoxan x 4 cycles from March 2002 thru May 2002 (total adriamycin dose 409 mg with weight 142, height 5'7", for total adriamycin 235 mg/m2), and local radiation  (50.4 Gy with boost to 60.4) Gy Note she declined PAC and received the chemotherapy in 2002 by peripheral vein. I followed her beginning Sept 2003 until last previous visit 07-2011. She declined TEACH trial with lapatinib in 2007 and 2008. Last breast MRI previously was 08-2011.  Patient had infertility treatments at [facility] in 2005, daughter born 02-01-2005. Menses had been very infrequent after chemo until the infertility treatment, then stopped after that pregnancy.  LEFT BREAST CANCER: Mammograms at Dr Sherran Needs office early 12-2013 had area questioned on left compared with last prior mammograms of 08-2012. Diagnostic left mammogram and [place] at [facility] on 01-01-14 showed a mass in left upper inner breast posteriorly 1.5 x 1.4 x 1.3 cm on [place], with no abnormal left axillary nodes identified. She had biopsy on 01-01-14 with pathology ([id number]) showing grade 1-2 invasive ductal carcinoma ER, PR and HER 2 negative with Ki 66%. She had breast MRI 01-15-2014 with solitary enhancing mass upper inner left breast 1.7 x 2.0 x 1.8 cm, right breast not remarkable, no abnormal appearing lymph nodes. She had CT CAP 01-29-14 then MRI liver 02-05-14 with no evidence of metastatic disease. Genetics testing with Breast/Ovarian Cancer Gene Panel 01-2014 did not reveal a pathogenic mutation in these genes, tho she did have 3 variants of uncertain significance (ATM, [id number]; ATM, c.8674G>A and PMS2, C.1309C>T). She had consultation with Dr Tammi Klippel prior to definitive surgery. She had lumpectomy and left axillary sentinel node biopsy by Dr Lucia Gaskins on 03-06-2014. Pathology [id number]) found 1.8 cm grade II invasive ductal carcinoma with one negative sentinel node and final margins negative. She had PAC placed by Dr Lucia Gaskins 03-06-14.Chemotherapy began with 2 cycles of  dose dense AC on 03-30-14 and 04-13-14, which was maximal adriamycin based on previous chemo, then one treatment of cytoxan taxotere on 02-16-80 complicated by severe rash  and hand foot syndrome. The second planned cycle of cytoxan taxotere was not given. She began weekly taxol on 05-21-14, total 12 cycles completed 09-10-14 after weeks 4 and 7 delayed due to low counts, and week 8 delayed due to hospitalizations/ PAC infection. PAC was removed for infection 07-11-14.Radiation to left breast by Dr Tammi Klippel: Radiation treatment dates: 10/22/2014-2/2/201 Site/dose:  1. The whole left breast was treated to 50.4 Gy in 28 fractions of 1.8 Gy 2. The lumpectomy surgical site was treated to a total dose of 60.4 Gy with 5 additional fractions of 2 Gy  .  Objective:  Vital signs in last 24 hours:  BP 131/75 mmHg  Pulse 79  Temp(Src) 98.1 F (36.7 C) (Oral)  Resp 18  Ht _0  (1.702 m)  Wt 177 lb 6.4 oz (80.468 kg)  BMI 27.78 kg/m2  SpO2 99%  weight is up 10 lbs from August, confirmed. Alert, oriented and appropriate. Ambulatory without difficulty.  No alopecia  HEENT:PERRL, sclerae not icteric. Oral mucosa moist without lesions, posterior pharynx clear.  Neck supple. No JVD.  Lymphatics:no cervical,supraclavicular, axillary or inguinal adenopathy Resp: clear to auscultation bilaterally and normal percussion bilaterally Cardio: regular rate and rhythm. No gallop. GI: soft, nontender, not distended, no mass or organomegaly. Normally active bowel sounds.  Musculoskeletal/ Extremities: without pitting edema, cords, tenderness Neuro: no peripheral neuropathy. Otherwise nonfocal. Psych appropriate mood and affect. Skin without rash, ecchymosis, petechiae Breasts: right with well healed lumpectomy scar, no dominant mass or other findings of concern. Left without dominant mass or nipple findings, does have evidence of lymphedema involving breast, not obvious in LUE.Marland Kitchen Axillae benign  Lab Results:  Results for orders placed or performed in visit on 10/07/15  CBC with Differential  Result Value Ref Range   WBC 3.9 3.9 - 10.3 10e3/uL   NEUT# 2.4 1.5 - 6.5 10e3/uL    HGB 14.1 11.6 - 15.9 g/dL   HCT 41.0 34.8 - 46.6 %   Platelets 133 (L) 145 - 400 10e3/uL   MCV 92.8 79.5 - 101.0 fL   MCH 31.9 25.1 - 34.0 pg   MCHC 34.4 31.5 - 36.0 g/dL   RBC 4.42 3.70 - 5.45 10e6/uL   RDW 12.9 11.2 - 14.5 %   lymph# 1.1 0.9 - 3.3 10e3/uL   MONO# 0.3 0.1 - 0.9 10e3/uL   Eosinophils Absolute 0.2 0.0 - 0.5 10e3/uL   Basophils Absolute 0.0 0.0 - 0.1 10e3/uL   NEUT% 60.4 38.4 - 76.8 %   LYMPH% 27.4 14.0 - 49.7 %   MONO% 7.9 0.0 - 14.0 %   EOS% 3.8 0.0 - 7.0 %   BASO% 0.5 0.0 - 2.0 %    CMET available after visit normal with exception of nonfasting glucose 146, including normal LFTs and electrolytes, with creatinine 0.9  Studies/Results:  No results found.  Medications: I have reviewed the patient's current medications.  DISCUSSION:  With assistance of sign language interpreter we have discussed apparent lymphedema of left breast, related to previous surgery and radiation. I have recommended lymphedema PT, which Regina Watts is interested in pursuing, referral made. We have discussed slightly lower platelet count again today, as she has had intermittently on previous labs. This is not symptomatic now, tho she knows to call if unusual bleeding or bruising; will follow.   Assessment/Plan:  1.Triple negative  left breast cancer T1cN0 diagnosed 12-2013, treated with lumpectomy and sentinel node evaluation 03-06-14. Adjuvant chemotherapy completed 09-10-2014; RT completed ~ 12-11-14. Breast MRI without findings of concern 03-20-15. She will have mammograms in 01-2016 and I will see her after mammograms. 2.history T2N0M0 right breast cancer 11-2000 at age 52 treated with lumpectomy, 2 sentinel nodes, chemotherapy and local radiation. This was ER PR negative and HER 2 + (prior to herceptin use)  3.Variable thrombocytopenia: Platelets slightly low again today without clinical findings, have been variable with last few labs. Hgb and WBC fine. 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 Communicates by reading/ writing and sign language.  6.multiple genetic variants of unknown significance on genetic testing 01-2014  7.negative colonoscopy by Dr Earle Gell ~ Jan 2015 8.gyn exam early May and appropriate to continue yearly with her oncologic history. Dr Sherran Needs help appreciated.  9.lymphedema left breast: referral to lymphedema  PT for evaluation and treatment   All questions answered and patient is in agreement with plans above. She knows to contact us if any concerns prior to next scheduled visit. Time spent 25 min including >50% counseling and coordination of care. Cc Dr Radene Knee, Dr Lysle Rubens, Dr Debby Freiberg, MD   10/07/2015, 10:49 AM

## 2015-10-08 ENCOUNTER — Other Ambulatory Visit: Payer: Self-pay | Admitting: *Deleted

## 2015-10-08 MED ORDER — VITAMIN B-6 50 MG PO TABS: 50.0000 mg | ORAL_TABLET | Freq: Two times a day (BID) | ORAL | Status: DC

## 2015-10-09 DIAGNOSIS — D696 Thrombocytopenia, unspecified: Secondary | ICD-10-CM | POA: Insufficient documentation

## 2015-10-09 DIAGNOSIS — I890 Lymphedema, not elsewhere classified: Secondary | ICD-10-CM | POA: Insufficient documentation

## 2016-01-15 ENCOUNTER — Ambulatory Visit
Admission: RE | Admit: 2016-01-15 | Discharge: 2016-01-15 | Disposition: A | Discharge status: Home / Self Care | Payer: Medicare Other | Source: Ambulatory Visit | Attending: Oncology | Admitting: Oncology

## 2016-01-15 DIAGNOSIS — C50212 Malignant neoplasm of upper-inner quadrant of left female breast: Secondary | ICD-10-CM

## 2016-01-15 DIAGNOSIS — Z853 Personal history of malignant neoplasm of breast: Secondary | ICD-10-CM

## 2016-01-21 ENCOUNTER — Other Ambulatory Visit: Payer: Self-pay | Admitting: Oncology

## 2016-02-03 ENCOUNTER — Other Ambulatory Visit (HOSPITAL_BASED_OUTPATIENT_CLINIC_OR_DEPARTMENT_OTHER): Payer: BLUE CROSS/BLUE SHIELD

## 2016-02-03 ENCOUNTER — Ambulatory Visit (HOSPITAL_BASED_OUTPATIENT_CLINIC_OR_DEPARTMENT_OTHER): Payer: BLUE CROSS/BLUE SHIELD | Admitting: Oncology

## 2016-02-03 ENCOUNTER — Encounter: Payer: Self-pay | Admitting: Oncology

## 2016-02-03 ENCOUNTER — Telehealth: Payer: Self-pay | Admitting: Oncology

## 2016-02-03 ENCOUNTER — Ambulatory Visit: Payer: BLUE CROSS/BLUE SHIELD | Admitting: Oncology

## 2016-02-03 VITALS — BP 116/63 | HR 82 | Temp 98.3°F | Resp 18 | Ht 67.0 in | Wt 178.3 lb

## 2016-02-03 DIAGNOSIS — D696 Thrombocytopenia, unspecified: Secondary | ICD-10-CM

## 2016-02-03 DIAGNOSIS — C50212 Malignant neoplasm of upper-inner quadrant of left female breast: Secondary | ICD-10-CM

## 2016-02-03 DIAGNOSIS — G620 Drug-induced polyneuropathy: Secondary | ICD-10-CM | POA: Diagnosis not present

## 2016-02-03 LAB — CBC WITH DIFFERENTIAL/PLATELET
BASO%: 0.7 % (ref 0.0–2.0)
Basophils Absolute: 0 10*3/uL (ref 0.0–0.1)
EOS ABS: 0.2 10*3/uL (ref 0.0–0.5)
EOS%: 5.4 % (ref 0.0–7.0)
HCT: 39.6 % (ref 34.8–46.6)
HGB: 13.4 g/dL (ref 11.6–15.9)
LYMPH%: 25.1 % (ref 14.0–49.7)
MCH: 31.2 pg (ref 25.1–34.0)
MCHC: 33.9 g/dL (ref 31.5–36.0)
MCV: 91.9 fL (ref 79.5–101.0)
MONO#: 0.3 10*3/uL (ref 0.1–0.9)
MONO%: 7.7 % (ref 0.0–14.0)
NEUT%: 61.1 % (ref 38.4–76.8)
NEUTROS ABS: 2.7 10*3/uL (ref 1.5–6.5)
Platelets: 143 10*3/uL — ABNORMAL LOW (ref 145–400)
RBC: 4.3 10*6/uL (ref 3.70–5.45)
RDW: 12.8 % (ref 11.2–14.5)
WBC: 4.5 10*3/uL (ref 3.9–10.3)
lymph#: 1.1 10*3/uL (ref 0.9–3.3)

## 2016-02-03 LAB — COMPREHENSIVE METABOLIC PANEL
ALBUMIN: 3.8 g/dL (ref 3.5–5.0)
ALK PHOS: 98 U/L (ref 40–150)
ALT: 44 U/L (ref 0–55)
ANION GAP: 8 meq/L (ref 3–11)
AST: 28 U/L (ref 5–34)
BILIRUBIN TOTAL: 0.69 mg/dL (ref 0.20–1.20)
BUN: 13 mg/dL (ref 7.0–26.0)
CO2: 27 mEq/L (ref 22–29)
Calcium: 9.2 mg/dL (ref 8.4–10.4)
Chloride: 108 mEq/L (ref 98–109)
Creatinine: 0.8 mg/dL (ref 0.6–1.1)
EGFR: 82 mL/min/{1.73_m2} — AB (ref >90)
Glucose: 137 mg/dl (ref 70–140)
Potassium: 3.9 mEq/L (ref 3.5–5.1)
Sodium: 143 mEq/L (ref 136–145)
TOTAL PROTEIN: 6.6 g/dL (ref 6.4–8.3)

## 2016-02-03 LAB — MORPHOLOGY
PLATELET MORPHOLOGY: NORMAL
PLT EST: ADEQUATE

## 2016-02-03 NOTE — Telephone Encounter (Signed)
per pof to sch pt appt-gave pt copy of avs °

## 2016-02-03 NOTE — Progress Notes (Signed)
OFFICE PROGRESS NOTE   February 03, 2016   Physicians:David Cristy Hilts, Patsy Lager, Genevieve Norlander  INTERVAL HISTORY:   Patient is seen, together with sign language interpreter, in scheduled follow up of bilateral breast cancer, most recently left triple negative 12-2013. She is on observation since completing adjuvant chemotherapy 09-2014. Most recent diagnostic bilateral mammograms were at [place] 01-15-16 Breast MRI 03-20-15 Gyn exam by Dr Radene Knee May 2016   [place] has been well since she was here last, particularly no recent viral infections. . Chemo related peripheral neuropathy in feet seems slightly improved, but persists in tips of fingers where it is worse when exposed to hot water as with washing dishes. She notices more asymmetry of breasts, without discomfort. Appetite is good, drinking lots of sweet tea, and is not exercising. No respiratory or GI complaints. No swelling extremities. No bleeding or unusual bruising. Good energy.  Remainder of 10 point Review of System negative.  No PAC Flu vaccine done by PCP fall 2016 3 variants of uncertain significance by Breast Ovarian panel 01-2014  (ATM, [phone]; ATM, c.8674G>A and PMS2, C.1309C>T)  ONCOLOGIC HISTORY RIGHT BREAST CANCER: Right breast cancer diagnosed January 2002 when patient was age 56 and premenopausal, T2N0M0 (4cm grade 3 invasive ductal carcinoma, <25% high grade DCIS, S phase 8.6%), ER negative, PR negative, HER 2 positive. She had lumpectomy with 2 negative sentinel nodes, adjuvant chemotherapy by Dr Starr Sinclair with adriamycin cytoxan x 4 cycles from March 2002 thru May 2002 (total adriamycin dose 409 mg with weight 142, height 5'7", for total adriamycin 235 mg/m2), and local radiation (50.4 Gy with boost to 60.4) Gy Note she declined PAC and received the chemotherapy in 2002 by peripheral vein. I followed her beginning Sept 2003 until last previous visit 07-2011. She  declined TEACH trial with lapatinib in 2007 and 2008. Last breast MRI previously was 08-2011.  Patient had infertility treatments at [facility] in 2005, daughter born 02-01-2005. Menses had been very infrequent after chemo until the infertility treatment, then stopped after that pregnancy.  LEFT BREAST CANCER: Mammograms at Dr Sherran Needs office early 12-2013 had area questioned on left compared with last prior mammograms of 08-2012. Diagnostic left mammogram and [place] at [facility] on 01-01-14 showed a mass in left upper inner breast posteriorly 1.5 x 1.4 x 1.3 cm on [place], with no abnormal left axillary nodes identified. She had biopsy on 01-01-14 with pathology ([id number]) showing grade 1-2 invasive ductal carcinoma ER, PR and HER 2 negative with Ki 66%. She had breast MRI 01-15-2014 with solitary enhancing mass upper inner left breast 1.7 x 2.0 x 1.8 cm, right breast not remarkable, no abnormal appearing lymph nodes. She had CT CAP 01-29-14 then MRI liver 02-05-14 with no evidence of metastatic disease. Genetics testing with Breast/Ovarian Cancer Gene Panel 01-2014 did not reveal a pathogenic mutation in these genes, tho she did have 3 variants of uncertain significance (ATM, c.[id number]; ATM, c.8674G>A and PMS2, C.1309C>T). She had consultation with Dr Tammi Klippel prior to definitive surgery. She had lumpectomy and left axillary sentinel node biopsy by Dr Lucia Gaskins on 03-06-2014. Pathology [id number]) found 1.8 cm grade II invasive ductal carcinoma with one negative sentinel node and final margins negative. She had PAC placed by Dr Lucia Gaskins 03-06-14.Chemotherapy began with 2 cycles of dose dense AC on 03-30-14 and 04-13-14, which was maximal adriamycin based on previous chemo, then one treatment of cytoxan taxotere on 3-73-42 complicated by severe rash and hand foot syndrome. The second  planned cycle of cytoxan taxotere was not given. She began weekly taxol on 05-21-14, total 12 cycles completed 09-10-14 after weeks 4 and 7 delayed due  to low counts, and week 8 delayed due to hospitalizations/ PAC infection. PAC was removed for infection 07-11-14.Radiation to left breast by Dr Tammi Klippel: Radiation treatment dates: 10/22/2014-2/2/201 Site/dose:  1. The whole left breast was treated to 50.4 Gy in 28 fractions of 1.8 Gy 2. The lumpectomy surgical site was treated to a total dose of 60.4 Gy with 5 additional fractions of 2 Gy  .  Objective:  Vital signs in last 24 hours:  BP 116/63 mmHg  Pulse 82  Temp(Src) 98.3 F (36.8 C) (Oral)  Resp 18  Ht '5\' 7"'  (1.702 m)  Wt 178 lb 4.8 oz (80.876 kg)  BMI 27.92 kg/m2  SpO2 97%   Alert, oriented and appropriate. Ambulatory without difficulty.  No alopecia  HEENT:PERRL, sclerae not icteric. Oral mucosa moist without lesions, posterior pharynx clear.  Neck supple. No JVD.  Lymphatics:no cervical,supraclavicular, axillary or inguinal adenopathy Resp: clear to auscultation bilaterally and normal percussion bilaterally Cardio: regular rate and rhythm. No gallop. GI: soft, nontender, not distended, no mass or organomegaly. Normally active bowel sounds.  Musculoskeletal/ Extremities: without pitting edema, cords, tenderness Neuro: no peripheral neuropathy. Otherwise nonfocal. Psych appropriate mood and affect. Skin without rash, ecchymosis, petechiae Breasts: right with well healed lumpectomy scar, no dominant mass or other findings of concern. Left without dominant mass or nipple findings, does have evidence of lymphedema involving breast, not obvious in LUE.Marland Kitchen Axillae benign  Lab Results:  Results for orders placed or performed in visit on 02/03/16  CBC with Differential  Result Value Ref Range   WBC 4.5 3.9 - 10.3 10e3/uL   NEUT# 2.7 1.5 - 6.5 10e3/uL   HGB 13.4 11.6 - 15.9 g/dL   HCT 39.6 34.8 - 46.6 %   Platelets 143 (L) 145 - 400 10e3/uL   MCV 91.9 79.5 - 101.0 fL   MCH 31.2 25.1 - 34.0 pg   MCHC 33.9 31.5 - 36.0 g/dL   RBC 4.30 3.70 - 5.45 10e6/uL   RDW 12.8 11.2  - 14.5 %   lymph# 1.1 0.9 - 3.3 10e3/uL   MONO# 0.3 0.1 - 0.9 10e3/uL   Eosinophils Absolute 0.2 0.0 - 0.5 10e3/uL   Basophils Absolute 0.0 0.0 - 0.1 10e3/uL   NEUT% 61.1 38.4 - 76.8 %   LYMPH% 25.1 14.0 - 49.7 %   MONO% 7.7 0.0 - 14.0 %   EOS% 5.4 0.0 - 7.0 %   BASO% 0.7 0.0 - 2.0 %  Morphology  Result Value Ref Range   Ovalocytes Few Negative   White Cell Comments C/W auto diff    PLT EST Adequate Adequate   Platelet Morphology Within Normal Limits Within Normal Limits  Comprehensive metabolic panel  Result Value Ref Range   Sodium 143 136 - 145 mEq/L   Potassium 3.9 3.5 - 5.1 mEq/L   Chloride 108 98 - 109 mEq/L   CO2 27 22 - 29 mEq/L   Glucose 137 70 - 140 mg/dl   BUN 13.0 7.0 - 26.0 mg/dL   Creatinine 0.8 0.6 - 1.1 mg/dL   Total Bilirubin 0.69 0.20 - 1.20 mg/dL   Alkaline Phosphatase 98 40 - 150 U/L   AST 28 5 - 34 U/L   ALT 44 0 - 55 U/L   Total Protein 6.6 6.4 - 8.3 g/dL   Albumin 3.8 3.5 - 5.0 g/dL  Calcium 9.2 8.4 - 10.4 mg/dL   Anion Gap 8 3 - 11 mEq/L   EGFR 82 (L) >90 ml/min/1.73 m2    CMET available after visit normal   Studies/Results:  No results found.  Medications: I have reviewed the patient's current medications.  DISCUSSION:  With assistance of sign language interpreter we have discussed normal exam findings with recent normal mammogram.   We have discussed slightly lower platelet count again today, as she has had intermittently on previous labs. This is not symptomatic now, though she knows to call if unusual bleeding or bruising; will follow.   Assessment/Plan:  1.Triple negative left breast cancer T1cN0 diagnosed 12-2013, treated with lumpectomy and sentinel node evaluation 03-06-14. Adjuvant chemotherapy completed 09-10-2014; RT completed ~ 12-11-14. Breast MRI without findings of concern 03-20-15. Mammogram in 01-2016 without findings of concern. 2.history T2N0M0 right breast cancer 11-2000 at age 40 treated with lumpectomy, 2 sentinel nodes,  chemotherapy and local radiation. This was ER PR negative and HER 2 + (prior to herceptin use)  3.Variable thrombocytopenia: Platelets slightly low again today without clinical findings, have been variable with last few labs. Hgb and WBC fine. Follow, morphology with next CBC 4.peripheral neuropathy related to taxanes: Symptoms gradually improving, follow 5.congenital deafness. Communicates by reading/ writing and sign language.  6.multiple genetic variants of unknown significance on genetic testing 01-2014  7.negative colonoscopy by Dr Earle Gell ~ Jan 2015 8.gyn exam early May and appropriate to continue yearly with her oncologic history. Dr Sherran Needs help appreciated.  9.lymphedema left breast: improved.   All questions answered and patient is in agreement with plans above. She knows to contact us if any concerns prior to next scheduled visit.   Time spent 20 min including >50% counseling and coordination of care. Cc Dr Radene Knee, Dr Lysle Rubens, Dr Kae Heller, NP   02/03/2016, 11:57 AM

## 2016-02-06 DIAGNOSIS — Z1389 Encounter for screening for other disorder: Secondary | ICD-10-CM | POA: Diagnosis not present

## 2016-02-06 DIAGNOSIS — R7303 Prediabetes: Secondary | ICD-10-CM | POA: Diagnosis not present

## 2016-02-06 DIAGNOSIS — R739 Hyperglycemia, unspecified: Secondary | ICD-10-CM | POA: Diagnosis not present

## 2016-02-06 DIAGNOSIS — R51 Headache: Secondary | ICD-10-CM | POA: Diagnosis not present

## 2016-02-06 DIAGNOSIS — E782 Mixed hyperlipidemia: Secondary | ICD-10-CM | POA: Diagnosis not present

## 2016-02-06 DIAGNOSIS — F419 Anxiety disorder, unspecified: Secondary | ICD-10-CM | POA: Diagnosis not present

## 2016-02-06 DIAGNOSIS — C50912 Malignant neoplasm of unspecified site of left female breast: Secondary | ICD-10-CM | POA: Diagnosis not present

## 2016-04-02 IMAGING — CT CT HEAD W/O CM
4 of 6 series · 12 of 30 positions shown, 13 images · non-contrast
Comparison: none

[Series 3: head w/o · axial · non-contrast · 0.45mm/px · z∈[-162,-58]mm · 4 of 35 slices shown (1 of 4)]
[im 7/35  brain]
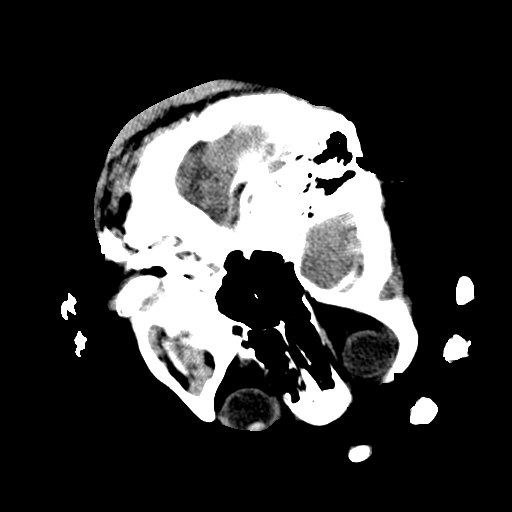
[im 14/35  brain]
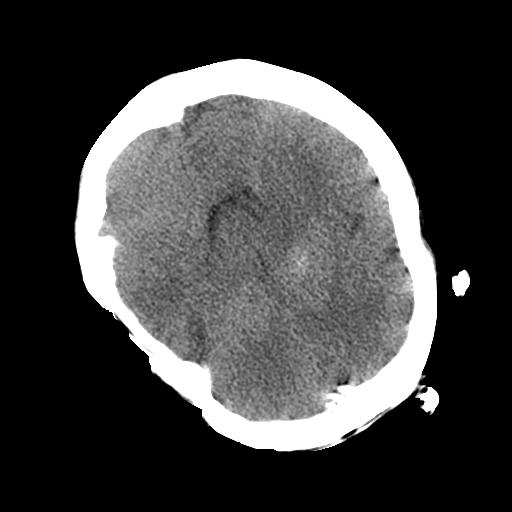
[im 21/35  brain]
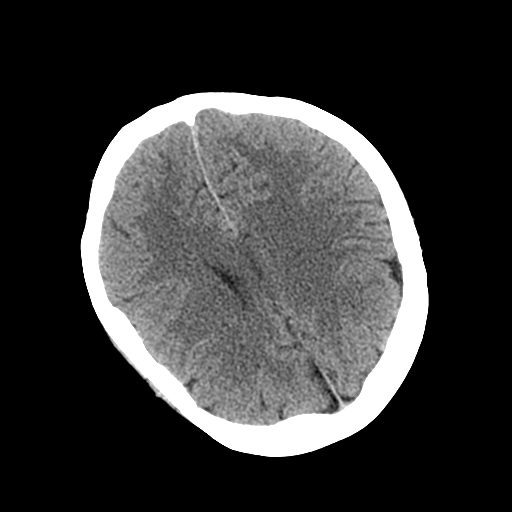
[im 28/35  brain]
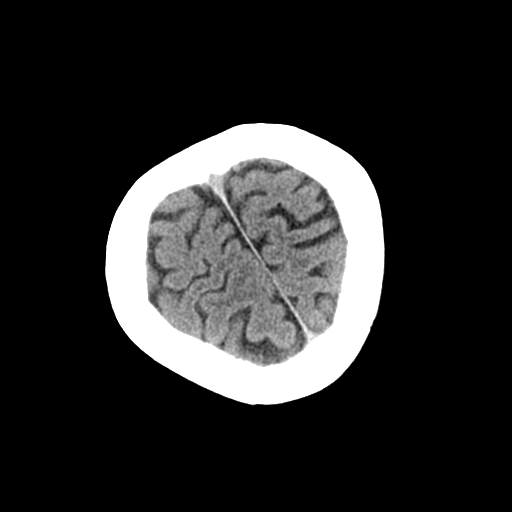

[Series 5: head w/o · axial · non-contrast · 0.45mm/px · z∈[-162,-128]mm · 2 of 21 slices shown (2 of 4)]
[im 7/21  brain]
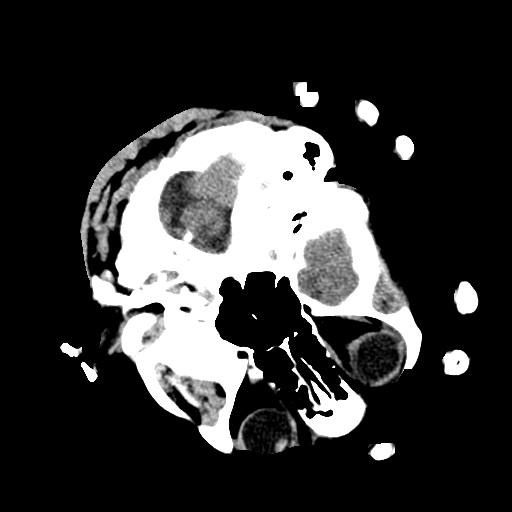
[im 14/21  brain]
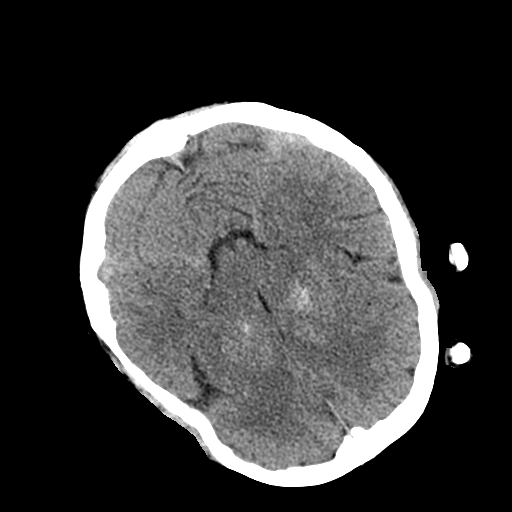

[Series 7: head w/o · axial · non-contrast · 0.45mm/px · z∈[-174,-72]mm · 4 of 37 slices shown, 5 images (3 of 4)]
[im 8/37  brain]
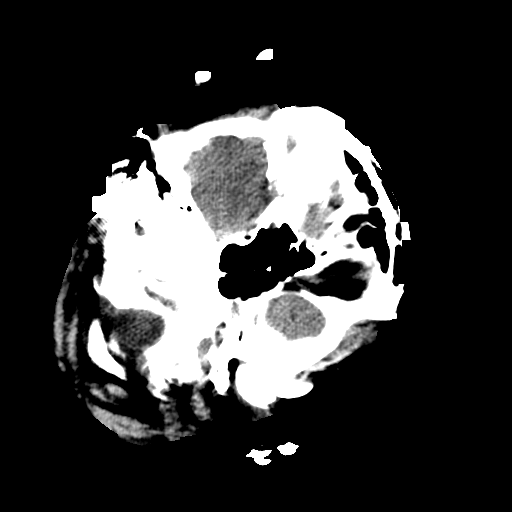
[im 8/37  bone]
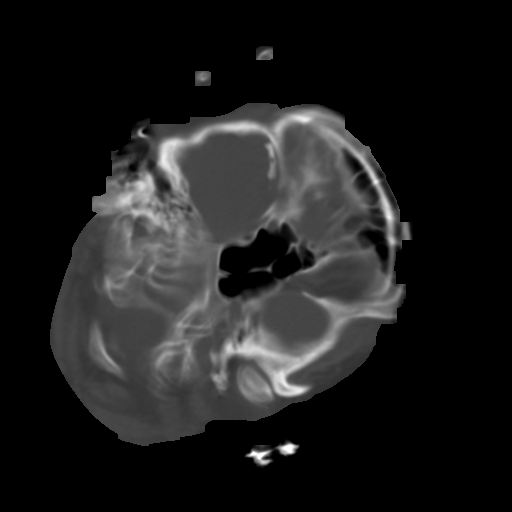
[im 15/37  brain]
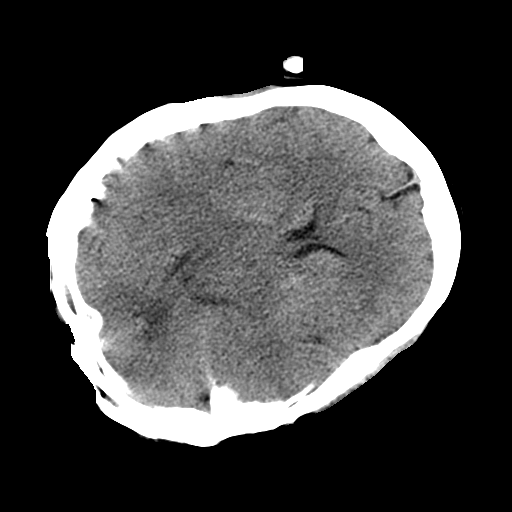
[im 22/37  brain]
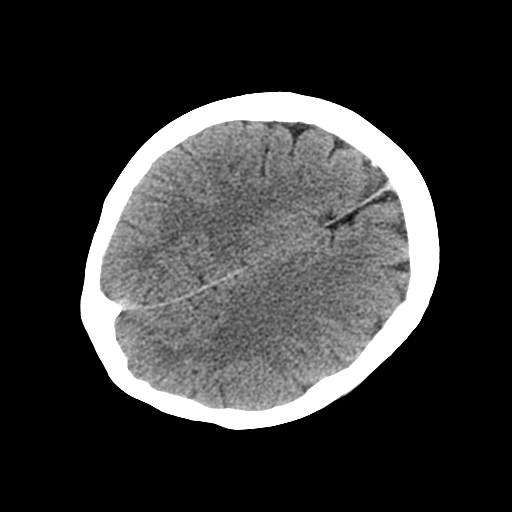
[im 29/37  brain]
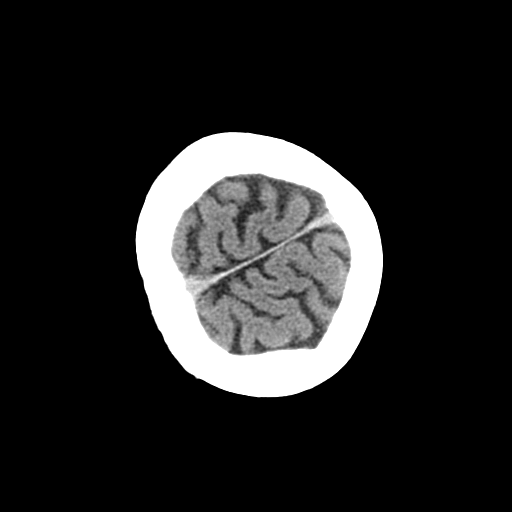

[Series 9: head w/o · axial · non-contrast · 0.45mm/px · z∈[-169,-135]mm · 2 of 22 slices shown (4 of 4)]
[im 8/22  brain]
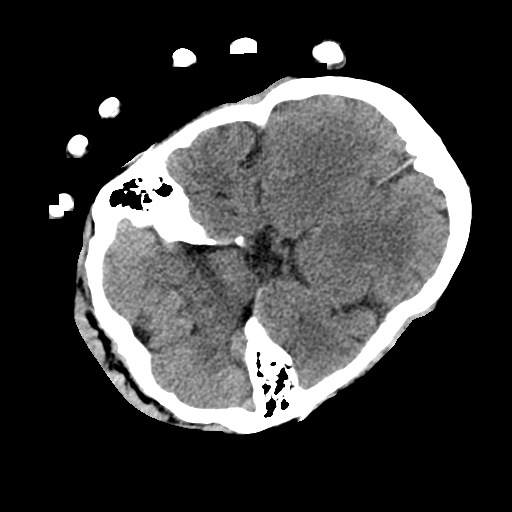
[im 15/22  brain]
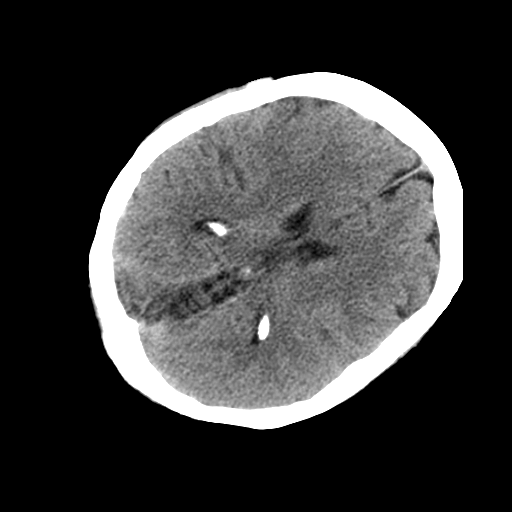

[12 of 30 positions shown; findings below may reference images not displayed]

Canned report from images found in remote index.

Refer to host system for actual result text.

## 2016-05-21 DIAGNOSIS — Z13820 Encounter for screening for osteoporosis: Secondary | ICD-10-CM | POA: Diagnosis not present

## 2016-05-21 DIAGNOSIS — Z01419 Encounter for gynecological examination (general) (routine) without abnormal findings: Secondary | ICD-10-CM | POA: Diagnosis not present

## 2016-05-21 DIAGNOSIS — Z6827 Body mass index (BMI) 27.0-27.9, adult: Secondary | ICD-10-CM | POA: Diagnosis not present

## 2016-05-26 ENCOUNTER — Other Ambulatory Visit: Payer: Self-pay | Admitting: Oncology

## 2016-05-26 ENCOUNTER — Telehealth: Payer: Self-pay

## 2016-05-26 DIAGNOSIS — C50212 Malignant neoplasm of upper-inner quadrant of left female breast: Secondary | ICD-10-CM

## 2016-05-26 DIAGNOSIS — Z853 Personal history of malignant neoplasm of breast: Secondary | ICD-10-CM

## 2016-05-26 DIAGNOSIS — Z1501 Genetic susceptibility to malignant neoplasm of breast: Secondary | ICD-10-CM

## 2016-05-26 DIAGNOSIS — Z1509 Genetic susceptibility to other malignant neoplasm: Secondary | ICD-10-CM

## 2016-05-26 NOTE — Telephone Encounter (Signed)
Pt received a letter from imaging center requesting she return for an MRI. She needs Dr Marko Plume to refer her for an MRI. This was through [employer], interpreter # [phone].

## 2016-05-27 ENCOUNTER — Telehealth: Payer: Self-pay

## 2016-05-27 NOTE — Telephone Encounter (Signed)
[facility] asked pt to call [facility]. They need an mammogram ordered and done before they can order the MRI breast. This is through [employer] interpreter [phone].

## 2016-05-29 ENCOUNTER — Other Ambulatory Visit: Payer: Self-pay

## 2016-05-29 ENCOUNTER — Telehealth: Payer: Self-pay

## 2016-05-29 DIAGNOSIS — C50212 Malignant neoplasm of upper-inner quadrant of left female breast: Secondary | ICD-10-CM

## 2016-05-29 NOTE — Telephone Encounter (Signed)
Regina Watts called via interpreter to find out her ov dates and times with Dr Marko Plume - given. Pt said she knows the mm and mri schedules.

## 2016-05-29 NOTE — Telephone Encounter (Signed)
Placed order for mammogram with [facility] for Pre MRI.

## 2016-06-05 ENCOUNTER — Ambulatory Visit
Admission: RE | Admit: 2016-06-05 | Discharge: 2016-06-05 | Disposition: A | Discharge status: Home / Self Care | Payer: Medicare Other | Source: Ambulatory Visit | Attending: Oncology | Admitting: Oncology

## 2016-06-05 DIAGNOSIS — C50212 Malignant neoplasm of upper-inner quadrant of left female breast: Secondary | ICD-10-CM

## 2016-06-09 ENCOUNTER — Ambulatory Visit
Admission: RE | Admit: 2016-06-09 | Discharge: 2016-06-09 | Disposition: A | Discharge status: Home / Self Care | Payer: BLUE CROSS/BLUE SHIELD | Source: Ambulatory Visit | Attending: Oncology | Admitting: Oncology

## 2016-06-09 DIAGNOSIS — N6489 Other specified disorders of breast: Secondary | ICD-10-CM | POA: Diagnosis not present

## 2016-06-09 DIAGNOSIS — Z1501 Genetic susceptibility to malignant neoplasm of breast: Secondary | ICD-10-CM

## 2016-06-09 DIAGNOSIS — Z853 Personal history of malignant neoplasm of breast: Secondary | ICD-10-CM

## 2016-06-09 DIAGNOSIS — Z1509 Genetic susceptibility to other malignant neoplasm: Secondary | ICD-10-CM

## 2016-06-09 DIAGNOSIS — C50212 Malignant neoplasm of upper-inner quadrant of left female breast: Secondary | ICD-10-CM

## 2016-06-09 MED ORDER — GADOBENATE DIMEGLUMINE 529 MG/ML IV SOLN
16.0000 mL | Freq: Once | INTRAVENOUS | Status: AC | PRN
  Administered 2016-06-09: 16 mL via INTRAVENOUS

## 2016-07-31 ENCOUNTER — Telehealth: Payer: Self-pay | Admitting: Oncology

## 2016-07-31 NOTE — Telephone Encounter (Signed)
PAL - MOVED FROM 9/25 TO 10/23 - LEFT MESSAGE FOR PATIENT USING SIGN LANGUAGE LINE.

## 2016-08-03 ENCOUNTER — Other Ambulatory Visit: Payer: BLUE CROSS/BLUE SHIELD

## 2016-08-03 ENCOUNTER — Ambulatory Visit: Payer: BLUE CROSS/BLUE SHIELD | Admitting: Oncology

## 2016-08-30 ENCOUNTER — Other Ambulatory Visit: Payer: Self-pay | Admitting: Oncology

## 2016-08-31 ENCOUNTER — Telehealth: Payer: Self-pay | Admitting: Oncology

## 2016-08-31 ENCOUNTER — Ambulatory Visit (HOSPITAL_BASED_OUTPATIENT_CLINIC_OR_DEPARTMENT_OTHER): Payer: BLUE CROSS/BLUE SHIELD | Admitting: Oncology

## 2016-08-31 ENCOUNTER — Other Ambulatory Visit (HOSPITAL_BASED_OUTPATIENT_CLINIC_OR_DEPARTMENT_OTHER): Payer: BLUE CROSS/BLUE SHIELD

## 2016-08-31 ENCOUNTER — Encounter: Payer: Self-pay | Admitting: Oncology

## 2016-08-31 VITALS — BP 109/66 | HR 71 | Temp 98.0°F | Resp 18 | Ht 67.0 in | Wt 176.3 lb

## 2016-08-31 DIAGNOSIS — Z853 Personal history of malignant neoplasm of breast: Secondary | ICD-10-CM

## 2016-08-31 DIAGNOSIS — R748 Abnormal levels of other serum enzymes: Secondary | ICD-10-CM

## 2016-08-31 DIAGNOSIS — H905 Unspecified sensorineural hearing loss: Secondary | ICD-10-CM

## 2016-08-31 DIAGNOSIS — C50212 Malignant neoplasm of upper-inner quadrant of left female breast: Secondary | ICD-10-CM

## 2016-08-31 DIAGNOSIS — G620 Drug-induced polyneuropathy: Secondary | ICD-10-CM | POA: Diagnosis not present

## 2016-08-31 DIAGNOSIS — Z23 Encounter for immunization: Secondary | ICD-10-CM

## 2016-08-31 DIAGNOSIS — D696 Thrombocytopenia, unspecified: Secondary | ICD-10-CM

## 2016-08-31 DIAGNOSIS — C50919 Malignant neoplasm of unspecified site of unspecified female breast: Secondary | ICD-10-CM

## 2016-08-31 DIAGNOSIS — T451X5A Adverse effect of antineoplastic and immunosuppressive drugs, initial encounter: Secondary | ICD-10-CM

## 2016-08-31 LAB — CBC WITH DIFFERENTIAL/PLATELET
BASO%: 0.5 % (ref 0.0–2.0)
BASOS ABS: 0 10*3/uL (ref 0.0–0.1)
EOS%: 3.2 % (ref 0.0–7.0)
Eosinophils Absolute: 0.1 10*3/uL (ref 0.0–0.5)
HEMATOCRIT: 41.1 % (ref 34.8–46.6)
HEMOGLOBIN: 13.7 g/dL (ref 11.6–15.9)
LYMPH#: 1.1 10*3/uL (ref 0.9–3.3)
LYMPH%: 25.7 % (ref 14.0–49.7)
MCH: 31.4 pg (ref 25.1–34.0)
MCHC: 33.5 g/dL (ref 31.5–36.0)
MCV: 93.7 fL (ref 79.5–101.0)
MONO#: 0.3 10*3/uL (ref 0.1–0.9)
MONO%: 8.2 % (ref 0.0–14.0)
NEUT#: 2.7 10*3/uL (ref 1.5–6.5)
NEUT%: 62.4 % (ref 38.4–76.8)
Platelets: 142 10*3/uL — ABNORMAL LOW (ref 145–400)
RBC: 4.38 10*6/uL (ref 3.70–5.45)
RDW: 12.8 % (ref 11.2–14.5)
WBC: 4.3 10*3/uL (ref 3.9–10.3)

## 2016-08-31 LAB — COMPREHENSIVE METABOLIC PANEL
ALBUMIN: 3.7 g/dL (ref 3.5–5.0)
ALK PHOS: 129 U/L (ref 40–150)
ALT: 64 U/L — AB (ref 0–55)
AST: 38 U/L — AB (ref 5–34)
Anion Gap: 8 mEq/L (ref 3–11)
BILIRUBIN TOTAL: 0.44 mg/dL (ref 0.20–1.20)
BUN: 9.7 mg/dL (ref 7.0–26.0)
CALCIUM: 9 mg/dL (ref 8.4–10.4)
CO2: 24 mEq/L (ref 22–29)
CREATININE: 0.8 mg/dL (ref 0.6–1.1)
Chloride: 108 mEq/L (ref 98–109)
EGFR: 84 mL/min/{1.73_m2} — ABNORMAL LOW (ref >90)
Glucose: 142 mg/dl — ABNORMAL HIGH (ref 70–140)
POTASSIUM: 4.3 meq/L (ref 3.5–5.1)
Sodium: 141 mEq/L (ref 136–145)
TOTAL PROTEIN: 6.7 g/dL (ref 6.4–8.3)

## 2016-08-31 LAB — MORPHOLOGY: PLT EST: DECREASED

## 2016-08-31 MED ORDER — INFLUENZA VAC SPLIT QUAD 0.5 ML IM SUSY
0.5000 mL | PREFILLED_SYRINGE | Freq: Once | INTRAMUSCULAR | Status: AC
Start: 2016-08-31 — End: 2016-08-31
  Administered 2016-08-31: 0.5 mL via INTRAMUSCULAR
  Filled 2016-08-31: qty 0.5

## 2016-08-31 NOTE — Progress Notes (Signed)
OFFICE PROGRESS NOTE   August 31, 2016   Physicians: Alphonsa Overall, Tyler Pita, Wenda Low, Arvella Nigh, Earle Gell, Rolm Bookbinder  INTERVAL HISTORY:   Patient is seen, together with sign language interpreter, in scheduled follow up of bilateral breast cancer, most recently triple negative on left 12-2013, previously Her 2+ right breast 2002 at age 55. She has BRCA VUS on genetics testing. Most recent bilateral tomo mammograms were at [facility] 01-15-16. Last breast MRI was 03-20-15. She is feeling well, but labs today have new slight elevation AST and ALT.  Gyn exam done by Dr Radene Knee spring 2017, yearly. She has PE with Dr Lysle Rubens in 10-2016 She has no regular follow up with general surgery  Patient has felt entirely well since she was seen by medical oncology 01-2016. She notices no changes in breasts. Energy is good, no pain, no respiratory or GI symptoms, appetite good without nausea or vomiting, no bleeding, no recent illness, no fevers, no swelling extremities. 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 She is not exercising regularly - discussed. No recent Vit D level per patient. No new medications including OTC. Remainder of 10 point Review of Systems negative   PAC removed with site infection near completion of chemo.  Flu vaccine 08-31-16 3 variants of uncertain significance by Breast Ovarian panel 01-2014  (ATM, [phone]; ATM, c.8674G>A and PMS2, C.1309C>T)  Daughter is 20, in sixth grade, not deaf.. Husband still works in [place], comes to [place] regularly; husband is hearing impaired  ONCOLOGIC HISTORY RIGHT BREAST CANCER: Right breast cancer diagnosed January 2002 when patient was age 33 and premenopausal, T2N0M0 (4cm grade 3 invasive ductal carcinoma, <25% high grade DCIS, S phase 8.6%), ER negative, PR negative, HER 2 positive. She had lumpectomy with 2 negative  sentinel nodes, adjuvant chemotherapy by Dr Starr Sinclair with adriamycin cytoxan x 4 cycles from March 2002 thru May 2002 (total adriamycin dose 409 mg with weight 142, height 5'7", for total adriamycin 235 mg/m2), and local radiation (50.4 Gy with boost to 60.4) Gy Note she declined PAC and received the chemotherapy in 2002 by peripheral vein. I followed her beginning Sept 2003 until last previous visit 07-2011. She declined TEACH trial with lapatinib in 2007 and 2008. Last breast MRI previously was 08-2011.  Patient had infertility treatments at [facility] in 2005, daughter born 02-01-2005. Menses had been very infrequent after chemo until the infertility treatment, then stopped after that pregnancy.  LEFT BREAST CANCER: Mammograms at Dr Sherran Needs office early 12-2013 had area questioned on left compared with last prior mammograms of 08-2012. Diagnostic left mammogram and [place] at [facility] on 01-01-14 showed a mass in left upper inner breast posteriorly 1.5 x 1.4 x 1.3 cm on [place], with no abnormal left axillary nodes identified. She had biopsy on 01-01-14 with pathology ([id number]) showing grade 1-2 invasive ductal carcinoma ER, PR and HER 2 negative with Ki 66%. She had breast MRI 01-15-2014 with solitary enhancing mass upper inner left breast 1.7 x 2.0 x 1.8 cm, right breast not remarkable, no abnormal appearing lymph nodes. She had CT CAP 01-29-14 then MRI liver 02-05-14 with no evidence of metastatic disease. Genetics testing with Breast/Ovarian Cancer Gene Panel 01-2014 did not reveal a pathogenic mutation in these genes, tho she did have 3 variants of uncertain significance (ATM, [id number]; ATM, c.8674G>A and PMS2, C.1309C>T). She had consultation with Dr Tammi Klippel prior to  definitive surgery. She had lumpectomy and left axillary sentinel node biopsy by Dr Lucia Gaskins on 03-06-2014. Pathology [id number]) found 1.8 cm grade II invasive ductal carcinoma with one negative sentinel node and final margins negative. She had  PAC placed by Dr Lucia Gaskins 03-06-14.Chemotherapy began with 2 cycles of dose dense AC on 03-30-14 and 04-13-14, which was maximal adriamycin based on previous chemo, then one treatment of cytoxan taxotere on 7-41-28 complicated by severe rash and hand foot syndrome. The second planned cycle of cytoxan taxotere was not given. She began weekly taxol on 05-21-14, total 12 cycles completed 09-10-14 after weeks 4 and 7 delayed due to low counts, and week 8 delayed due to hospitalizations/ PAC infection. PAC was removed for infection 07-11-14.Radiation to left breast by Dr Tammi Klippel: Radiation treatment dates: 10/22/2014-2/2/201 Site/dose:  1.  left breast was treated to 50.4 Gy in 28 fractions of 1.8 Gy 2. The lumpectomy surgical site was treated to a total dose of 60.4 Gy with 5 additional fractions of 2 Gy    Objective:  Vital signs in last 24 hours:  BP 109/66 (BP Location: Right Arm, Patient Position: Sitting)   Pulse 71   Temp 98 F (36.7 C) (Oral)   Resp 18   Ht _0  (1.702 m)   Wt 176 lb 4.8 oz (80 kg)   SpO2 95%   BMI 27.61 kg/m  Weight down 2 lbs Alert, oriented and appropriate. Ambulatory without difficulty, wearing sandals  Normal hair pattern  HEENT:PERRL, sclerae not icteric. Oral mucosa moist without lesions, posterior pharynx clear.  Neck supple. No JVD.  Lymphatics:no cervical,supraclavicular, axillary or inguinal adenopathy Resp: clear to auscultation bilaterally and normal percussion bilaterally. Respirations not labored RA Cardio: regular rate and rhythm. No gallop. GI: soft, nontender, not distended, no mass or organomegaly, cannot tell liver edge. Normally active bowel sounds.  Musculoskeletal/ Extremities: UE/ LE without pitting edema, cords, tenderness Neuro: peripheral neuropathy distal feet as noted. Congenital deafness. Otherwise nonfocal. PSYCH appropriate mood and affect. Skin without rash, ecchymosis, petechiae Breasts: Left with well healed lumpectomy scar, mild  lymphedema improved from previous, no dominant mass, no skin or nipple findings of concern. Right breast with well healed superior surgical scar, no lymphedema apparent, no dominant mass, skin or nipple findings. Axillae benign. Portacath scar well healed (site infection such that PAC removed near completion of chemo)  Lab Results:  Results for orders placed or performed in visit on 08/31/16  CBC with Differential/Platelet  Result Value Ref Range   WBC 4.3 3.9 - 10.3 10e3/uL   NEUT# 2.7 1.5 - 6.5 10e3/uL   HGB 13.7 11.6 - 15.9 g/dL   HCT 41.1 34.8 - 46.6 %   Platelets 142 (L) 145 - 400 10e3/uL   MCV 93.7 79.5 - 101.0 fL   MCH 31.4 25.1 - 34.0 pg   MCHC 33.5 31.5 - 36.0 g/dL   RBC 4.38 3.70 - 5.45 10e6/uL   RDW 12.8 11.2 - 14.5 %   lymph# 1.1 0.9 - 3.3 10e3/uL   MONO# 0.3 0.1 - 0.9 10e3/uL   Eosinophils Absolute 0.1 0.0 - 0.5 10e3/uL   Basophils Absolute 0.0 0.0 - 0.1 10e3/uL   NEUT% 62.4 38.4 - 76.8 %   LYMPH% 25.7 14.0 - 49.7 %   MONO% 8.2 0.0 - 14.0 %   EOS% 3.2 0.0 - 7.0 %   BASO% 0.5 0.0 - 2.0 %  Comprehensive metabolic panel  Result Value Ref Range   Sodium 141 136 - 145 mEq/L  Potassium 4.3 3.5 - 5.1 mEq/L   Chloride 108 98 - 109 mEq/L   CO2 24 22 - 29 mEq/L   Glucose 142 (H) 70 - 140 mg/dl   BUN 9.7 7.0 - 26.0 mg/dL   Creatinine 0.8 0.6 - 1.1 mg/dL   Total Bilirubin 0.44 0.20 - 1.20 mg/dL   Alkaline Phosphatase 129 40 - 150 U/L   AST 38 (H) 5 - 34 U/L   ALT 64 (H) 0 - 55 U/L   Total Protein 6.7 6.4 - 8.3 g/dL   Albumin 3.7 3.5 - 5.0 g/dL   Calcium 9.0 8.4 - 10.4 mg/dL   Anion Gap 8 3 - 11 mEq/L   EGFR 84 (L) >90 ml/min/1.73 m2  Morphology  Result Value Ref Range   Polychromasia Slight Slight   Tear Drop Cells Few Negative   White Cell Comments C/W auto diff    PLT EST Decreased Adequate   Prior CMETs normal LFTs, including 02-03-16 AST 28 and ALT 44,  And 09-2015 AST 23 and ALT 43  Studies/Results:  No results found.  Medications: I have reviewed the  patient's current medications.  DISCUSSION Clinically doing well, however AST and ALT new slight elevations today, no obvious cause by history or exam. WIll recheck labs in 2 weeks, likely need CT if still elevated Will check Vit D level with next labs drawn Needs mammograms at least 01-2017 Would consider breast MRI within 3 months after mammograms based on history.  Did not discuss different physician at this visit.  Assessment/Plan:  1.Triple negative left breast cancer T1cN0 diagnosed 12-2013, treated with lumpectomy and sentinel node evaluation 03-06-14. Adjuvant chemotherapy completed 09-10-2014; RT completed ~ 12-11-14. Breast MRI without findings of concern 03-20-15. She will have mammograms in 01-2016 and I will see her after mammograms. 2.history T2N0M0 right breast cancer 11-2000 at age 69 treated with lumpectomy, 2 sentinel nodes, chemotherapy and local radiation. ER PR negative and HER 2 + (prior to herceptin use) 3.New slight elevation in AST and ALT, etiology not clear. Repeat labs in ~ 2 weeks 5.congenital deafness. Communicates by reading/ writing and sign language.  6.multiple genetic variants of unknown significance on genetic testing 01-2014  7.negative colonoscopy by Dr Earle Gell ~ Jan 2015 8.gyn exam done spring 2017. Appropriate Dr Radene Knee continuing to follow her yearly with this oncologic history.   9.lymphedema left breast: referral to lymphedema  PT previously, tho no notes in this EMR that I locate for that treatment 10. Intolerance to taxotere with severe hand foot syndrome after one treatment. 11. No PAC with chemo 2002; PAC for chemo 5859, complicated by site infection. No PAC now. She has had only 1-2 axillary sentinel nodes removed, has had bilateral radiation, so some limits to UE venous access. 12.Variable thrombocytopenia: Platelets stable today without clinical findings, have been variable with last few labs. 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 Follow  14. Flu vaccine 08-31-16 All questions answered and patient understands recommendations and plans. Time spent 25 min including >50% counseling and coordination of care. Cc Drs Lysle Rubens, including labs above, and Dr Radene Knee  Evlyn Clines, MD   08/31/2016, 5:30 PM

## 2016-08-31 NOTE — Telephone Encounter (Signed)
GAVE PATIENT AVS REPORT AND APPOINTMENTS FOR November.  °

## 2016-09-02 DIAGNOSIS — C50212 Malignant neoplasm of upper-inner quadrant of left female breast: Secondary | ICD-10-CM | POA: Insufficient documentation

## 2016-09-14 ENCOUNTER — Other Ambulatory Visit: Payer: BLUE CROSS/BLUE SHIELD

## 2016-09-15 ENCOUNTER — Telehealth: Payer: Self-pay

## 2016-09-15 ENCOUNTER — Telehealth: Payer: Self-pay | Admitting: Oncology

## 2016-09-15 ENCOUNTER — Other Ambulatory Visit (HOSPITAL_BASED_OUTPATIENT_CLINIC_OR_DEPARTMENT_OTHER): Payer: BLUE CROSS/BLUE SHIELD

## 2016-09-15 DIAGNOSIS — C50212 Malignant neoplasm of upper-inner quadrant of left female breast: Secondary | ICD-10-CM

## 2016-09-15 DIAGNOSIS — Z23 Encounter for immunization: Secondary | ICD-10-CM

## 2016-09-15 LAB — COMPREHENSIVE METABOLIC PANEL
ALT: 54 U/L (ref 0–55)
ANION GAP: 10 meq/L (ref 3–11)
AST: 28 U/L (ref 5–34)
Albumin: 3.8 g/dL (ref 3.5–5.0)
Alkaline Phosphatase: 141 U/L (ref 40–150)
BUN: 24.1 mg/dL (ref 7.0–26.0)
CHLORIDE: 107 meq/L (ref 98–109)
CO2: 25 meq/L (ref 22–29)
CREATININE: 0.8 mg/dL (ref 0.6–1.1)
Calcium: 10 mg/dL (ref 8.4–10.4)
EGFR: 79 mL/min/{1.73_m2} — ABNORMAL LOW (ref >90)
Glucose: 123 mg/dl (ref 70–140)
Potassium: 4 mEq/L (ref 3.5–5.1)
Sodium: 142 mEq/L (ref 136–145)
Total Bilirubin: 0.67 mg/dL (ref 0.20–1.20)
Total Protein: 7.2 g/dL (ref 6.4–8.3)

## 2016-09-15 NOTE — Telephone Encounter (Signed)
LM via interpreter for Ms Latzke to call the [facility] to r/s missed lab work prior to 09-21-16 visit with Dr. Marko Plume.

## 2016-09-15 NOTE — Telephone Encounter (Signed)
Missed 11/6 Appointment rescheduled per patient request.

## 2016-09-15 NOTE — Telephone Encounter (Signed)
-----   Message from Gordy Levan, MD sent at 08/31/2016 11:20 AM EDT ----- Regarding: lab 11-6, may need CT prior to LL 11-13 If LFTs still elevated on repeat labs 11-6, will need CT AP prior to my visit on 11-13

## 2016-09-16 LAB — VITAMIN D 25 HYDROXY (VIT D DEFICIENCY, FRACTURES): Vitamin D, 25-Hydroxy: 17.4 ng/mL — ABNORMAL LOW (ref 30.0–100.0)

## 2016-09-16 LAB — CANCER ANTIGEN 27.29: CA 27.29: 23.7 U/mL (ref 0.0–38.6)

## 2016-09-20 ENCOUNTER — Other Ambulatory Visit: Payer: Self-pay | Admitting: Oncology

## 2016-09-21 ENCOUNTER — Other Ambulatory Visit: Payer: Self-pay

## 2016-09-21 ENCOUNTER — Encounter: Payer: Self-pay | Admitting: Oncology

## 2016-09-21 ENCOUNTER — Telehealth: Payer: Self-pay

## 2016-09-21 ENCOUNTER — Ambulatory Visit (HOSPITAL_BASED_OUTPATIENT_CLINIC_OR_DEPARTMENT_OTHER): Payer: BLUE CROSS/BLUE SHIELD | Admitting: Oncology

## 2016-09-21 VITALS — BP 123/77 | HR 77 | Temp 98.0°F | Resp 16 | Ht 67.0 in | Wt 176.1 lb

## 2016-09-21 DIAGNOSIS — R748 Abnormal levels of other serum enzymes: Secondary | ICD-10-CM

## 2016-09-21 DIAGNOSIS — C50919 Malignant neoplasm of unspecified site of unspecified female breast: Secondary | ICD-10-CM

## 2016-09-21 DIAGNOSIS — I890 Lymphedema, not elsewhere classified: Secondary | ICD-10-CM

## 2016-09-21 DIAGNOSIS — C50212 Malignant neoplasm of upper-inner quadrant of left female breast: Secondary | ICD-10-CM

## 2016-09-21 DIAGNOSIS — H905 Unspecified sensorineural hearing loss: Secondary | ICD-10-CM

## 2016-09-21 DIAGNOSIS — E559 Vitamin D deficiency, unspecified: Secondary | ICD-10-CM

## 2016-09-21 DIAGNOSIS — G620 Drug-induced polyneuropathy: Secondary | ICD-10-CM | POA: Diagnosis not present

## 2016-09-21 DIAGNOSIS — Z853 Personal history of malignant neoplasm of breast: Secondary | ICD-10-CM

## 2016-09-21 MED ORDER — VITAMIN D3 50 MCG (2000 UT) PO CAPS
2000.0000 [IU] | ORAL_CAPSULE | Freq: Every day | ORAL | 1 refills | Status: DC
Start: 2016-09-21 — End: 2017-01-04

## 2016-09-21 MED ORDER — VITAMIN B-6 50 MG PO TABS: 50.0000 mg | ORAL_TABLET | Freq: Two times a day (BID) | ORAL | 3 refills | Status: DC

## 2016-09-21 NOTE — Telephone Encounter (Signed)
Prescription called to pharmacy.  They will inform Regina Watts that the 200 mg dose if fine as noted below by Dr. Marko Plume.

## 2016-09-21 NOTE — Telephone Encounter (Signed)
-----   Message from Gordy Levan, MD sent at 09/21/2016 11:16 AM EST ----- Regarding: vit D Please let pharmacist give her Vit D 3 2000 units to use daily, prefers small tablets.  Will need to stay on this dose until Vit D rechecked , then will need to continue at lower dose after level is improved.   I had told her we might use higher dose once weekly, but I believe this dose daily will be fine. OTC ok if not prescription, but hopefully pharmacist can help her get correct one  thanks

## 2016-09-21 NOTE — Progress Notes (Signed)
OFFICE PROGRESS NOTE   September 21, 2016   Physicians:David Cristy Hilts, Patsy Lager, Genevieve Norlander  INTERVAL HISTORY:   Patient is seen, together with sign language interpreter, in follow up of slight new elevation in AST and ALT by labs in 08-2016, at 38 and 64 respectively. Fortunately, labs repeated for this visit are again in normal ranges. She has history of bilateral breast cancer, most recently triple negative in 12-2013; she has been on observation since completing adjuvant chemotherapy 09-2014. Most recent bilateral tomo mammograms were at [facility] 01-15-16.  Last breast MRI was 03-20-15. .  Gyn exam done by Dr Radene Knee spring 2017, yearly. She will have PE with Dr Lysle Rubens in 10-2016 She has no regular follow up with general surgery   Patient continues to feel well, as she did at visit 08-31-16. She had viral URI after last visit, which resolved entirely. Energy and appetite are fine, no fever, no respiratory or GI symptoms. Remainder of full 10 point Review of Systems negative/ unchanged from my note 08-31-16.   PAC removed with site infection near completion of chemo.  Flu vaccine 08-31-16 3 variants of uncertain significance by Breast Ovarian panel 01-2014 (ATM, [phone]; ATM, c.8674G>A and PMS2, C.1309C>T)   Daughter age 81, 6th grade, no hearing impairment. Husband hearing impaired, works in [place], travels frequently to [place].  ONCOLOGIC HISTORY RIGHT BREAST CANCER: Right breast cancer diagnosed January 2002 when patient was age 63 and premenopausal, T2N0M0 (4cm grade 3 invasive ductal carcinoma, <25% high grade DCIS, S phase 8.6%), ER negative, PR negative, HER 2 positive. She had lumpectomy with 2 negative sentinel nodes, adjuvant chemotherapy by Dr Starr Sinclair with adriamycin cytoxan x 4 cycles from March 2002 thru May 2002 (total adriamycin dose 409 mg with weight 142, height 5'7", for total adriamycin 235 mg/m2), and  local radiation (50.4 Gy with boost to 60.4) Gy Note she declined PAC and received the chemotherapy in 2002 by peripheral vein. I followed her beginning Sept 2003 until last previous visit 07-2011. She declined TEACH trial with lapatinib in 2007 and 2008. Last breast MRI previously was 08-2011.  Patient had infertility treatments at [facility] in 2005, daughter born 02-01-2005. Menses had been very infrequent after chemo until the infertility treatment, then stopped after that pregnancy.  LEFT BREAST CANCER: Mammograms at Dr Sherran Needs office early 12-2013 had area questioned on left compared with last prior mammograms of 08-2012. Diagnostic left mammogram and [place] at [facility] on 01-01-14 showed a mass in left upper inner breast posteriorly 1.5 x 1.4 x 1.3 cm on [place], with no abnormal left axillary nodes identified. She had biopsy on 01-01-14 with pathology ([id number]) showing grade 1-2 invasive ductal carcinoma ER, PR and HER 2 negative with Ki 66%. She had breast MRI 01-15-2014 with solitary enhancing mass upper inner left breast 1.7 x 2.0 x 1.8 cm, right breast not remarkable, no abnormal appearing lymph nodes. She had CT CAP 01-29-14 then MRI liver 02-05-14 with no evidence of metastatic disease. Genetics testing with Breast/Ovarian Cancer Gene Panel 01-2014 did not reveal a pathogenic mutation in these genes, tho she did have 3 variants of uncertain significance (ATM, [id number]; ATM, c.8674G>A and PMS2, C.1309C>T). She had consultation with Dr Tammi Klippel prior to definitive surgery. She had lumpectomy and left axillary sentinel node biopsy by Dr Lucia Gaskins on 03-06-2014. Pathology [id number]) found 1.8 cm grade II invasive ductal carcinoma with one negative sentinel node and final margins negative. She had PAC placed by Dr  Newman 03-06-14.Chemotherapy began with 2 cycles of dose dense AC on 03-30-14 and 04-13-14, which was maximal adriamycin based on previous chemo, then one treatment of cytoxan taxotere on 05-03-62 complicated  by severe rash and hand foot syndrome. The second planned cycle of cytoxan taxotere was not given. She began weekly taxol on 05-21-14, total 12 cycles completed 09-10-14 after weeks 4 and 7 delayed due to low counts, and week 8 delayed due to hospitalizations/ PAC infection. PAC was removed for infection 07-11-14.Radiation to left breast by Dr Tammi Klippel: Radiation treatment dates: 10/22/2014-2/2/201 Site/dose:  1.  left breast was treated to 50.4 Gy in 28 fractions of 1.8 Gy 2. The lumpectomy surgical site was treated to a total dose of 60.4 Gy with 5 additional fractions of 2 Gy  Objective:  Vital signs in last 24 hours:  BP 123/77 (BP Location: Right Arm, Patient Position: Sitting)   Pulse 77   Temp 98 F (36.7 C) (Oral)   Resp 16   Ht 5' 7" (1.702 m)   Wt 176 lb 1.6 oz (79.9 kg)   SpO2 95%   BMI 27.58 kg/m  Weight stable Alert, oriented and appropriate, looks entirely comfortable. Ambulatory without difficulty.  No alopecia  HEENT:PERRL, sclerae not icteric. Oral mucosa moist without lesions, posterior pharynx clear.  Lymphatics:no suraclavicular or axillary adenopathy Resp: clear to auscultation bilaterally and normal percussion bilaterally Cardio: regular rate and rhythm. No gallop. GI: soft, nontender, not distended, no mass or organomegaly. Normally active bowel sounds.  Musculoskeletal/ Extremities: UE/ LE without pitting edema, cords, tenderness Neuro: congenital deafness, residual peripheral neuropathy in feet, otherwise nonfocal Skin without rash, ecchymosis, petechiae Breast exam done 08-31-16, not repeated today.  Lab Results:  Results for orders placed or performed in visit on 09/15/16  Comprehensive metabolic panel  Result Value Ref Range   Sodium 142 136 - 145 mEq/L   Potassium 4.0 3.5 - 5.1 mEq/L   Chloride 107 98 - 109 mEq/L   CO2 25 22 - 29 mEq/L   Glucose 123 70 - 140 mg/dl   BUN 24.1 7.0 - 26.0 mg/dL   Creatinine 0.8 0.6 - 1.1 mg/dL   Total  Bilirubin 0.67 0.20 - 1.20 mg/dL   Alkaline Phosphatase 141 40 - 150 U/L   AST 28 5 - 34 U/L   ALT 54 0 - 55 U/L   Total Protein 7.2 6.4 - 8.3 g/dL   Albumin 3.8 3.5 - 5.0 g/dL   Calcium 10.0 8.4 - 10.4 mg/dL   Anion Gap 10 3 - 11 mEq/L   EGFR 79 (L) >90 ml/min/1.73 m2  Vitamin D 25 hydroxy  Result Value Ref Range   Vitamin D, 25-Hydroxy 17.4 (L) 30.0 - 100.0 ng/mL  Cancer antigen 27.29  Result Value Ref Range   CA 27.29 23.7 0.0 - 38.6 U/mL     Studies/Results:  No results found.  Medications: I have reviewed the patient's current medications. Will start Vit D3  2000 units daily  DISCUSSION  Etiology for transient elevation in AST and ALT not known, but we are pleased that this has resolved by labs from 09-15-16; she does not need scans now. I have encouraged increasing weight bearing exercise and watching diet.   She recalls having been on Vitamin D by Dr Lysle Rubens ~ 2 years ago, which she disliked due to "large pill", likely was calcium with D. She agrees to start D3 daily as above, will need follow up in several months here or by PCP.  Patient  understands that one of the breast medical oncologists will follow her after first of year. She prefers female physicians, appointment requested with Dr Burr Medico x 30 min to allow communication thru sign language interpreter. Patient understands that she can call at any time prior to scheduled appointment if needed.    Assessment/Plan: 1.Triple negative left breast cancer T1cN0 diagnosed 12-2013, treated with lumpectomy and sentinel node evaluation 03-06-14. Adjuvant chemotherapy completed 09-10-2014; RT completed ~ 12-11-14. Breast MRI without findings of concern 03-20-15. On observation 2.history T2N0M0 right breast cancer 11-2000 at age 57 treated with lumpectomy, 2 sentinel nodes, chemotherapy and local radiation. ER PR negative and HER 2 + (prior to herceptin use) 3.New slight elevation in AST and ALT 08-31-16, resolved by labs done for follow up  visit today. Follow with next physician visit.  5.congenital deafness. Communicates by reading/ writing and sign language.  6.multiple genetic variants of unknown significance on genetic testing 01-2014  7.negative colonoscopy by Dr Earle Gell ~ Jan 2015 8.gyn exam done spring 2017. Appropriate Dr Radene Knee continuing to follow her yearly with this oncologic history.   9.lymphedema left breast: referral to lymphedema PT previously 10. Intolerance to taxotere with severe hand foot syndrome after one treatment. 11. No PAC with chemo 2002; PAC for chemo 5638, complicated by site infection, with PAC removed. She had only 1-2 axillary sentinel nodes removed, but had bilateral radiation, thus some compromise to UE venous access. 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 Follow  14. Flu vaccine 08-31-16 15.Vitamin D deficiency: resume supplement and follow   All questions answered and patient is in agreement with recommendations and plans. Cc Dr Lysle Rubens, Dr Radene Knee. We appreciate Dr Ernestina Penna help with this very nice lady beginning Jan 2018. Time spent 20 min including >50% counseling and coordination of care.    Evlyn Clines, MD   09/21/2016, 5:30 PM

## 2016-11-06 ENCOUNTER — Other Ambulatory Visit: Payer: Self-pay | Admitting: Nurse Practitioner

## 2016-12-06 ENCOUNTER — Telehealth: Payer: Self-pay | Admitting: Hematology

## 2016-12-06 NOTE — Telephone Encounter (Signed)
Lvm advising appt chg from 2/13 to 2/26 @ 12.30.

## 2016-12-07 NOTE — Telephone Encounter (Signed)
Patient returned call and confirmed new appointments for 2/26.

## 2016-12-08 ENCOUNTER — Other Ambulatory Visit: Payer: Self-pay | Admitting: Oncology

## 2016-12-08 DIAGNOSIS — Z853 Personal history of malignant neoplasm of breast: Secondary | ICD-10-CM

## 2016-12-08 DIAGNOSIS — Z1231 Encounter for screening mammogram for malignant neoplasm of breast: Secondary | ICD-10-CM

## 2016-12-22 ENCOUNTER — Other Ambulatory Visit: Payer: BLUE CROSS/BLUE SHIELD

## 2016-12-22 ENCOUNTER — Ambulatory Visit: Payer: BLUE CROSS/BLUE SHIELD | Admitting: Hematology

## 2017-01-01 NOTE — Progress Notes (Signed)
[place]  [phone]) [phone] Fax:(336) [phone]  Clinic Follow Up Note   Patient Care Team: Wenda Low, MD as PCP - General (Internal Medicine) Gordy Levan, MD as Consulting Physician (Oncology) Tyler Pita, MD as Consulting Physician (Radiation Oncology) Alphonsa Overall, MD as Consulting Physician (General Surgery) 01/04/2017  CHIEF COMPLAINTS:  F/u Triple negative left breast cancer    Breast cancer of upper-inner quadrant of left female breast ([place])   12/2013 Initial Diagnosis    Triple negative malignant neoplasm of breast ([place])      01/01/2014 Pathology Results    Breast, left, needle core biopsy, mass, 10 o'clock - INVASIVE DUCTAL CARCINOMA.      03/06/2014 Surgery    S/p Lumpectomy by Dr. Lucia Gaskins at [facility].       03/06/2014 Pathology Results    1. Lymph node, sentinel, biopsy, Left axillary - ONE BENIGN LYMPH NODE WITH NO TUMOR SEEN (0/1). 2. Breast, lumpectomy, Left - INVASIVE GRADE II DUCTAL CARCINOMA SPANNING 1.8 CM IN GREATEST DIMENSION. - INVASIVE TUMOR IS EXTREMELY CLOSE (0.1 CM) TO POSTERIOR MARGIN ON INITIAL LUMPECTOMY SPECIMEN, PLEASE SEE SPECIMEN # 3 FOR FINAL MARGIN STATUS. - OTHER MARGINS ARE NEGATIVE. - SEE ONCOLOGY TEMPLATE. 3. Breast, excision, Left posterior margin - BENIGN BREAST PARENCHYMA. - NO TUMOR SEEN.      03/06/2014 Receptors her2    Estrogen receptor: negative. Progesterone receptor: negative. HER-2: negative      03/30/2014 - 04/14/2014 Chemotherapy    The patient had adjuvant chemotherapy treatment, Cytoxan and Adriamycin for 4 cycles then Taxol for 12 weeks       10/22/2014 - 12/11/2014 Radiation Therapy     The patient saw Dr. Tammi Klippel for radiation treatment. This is the current list of radiation treatment:  1.  The whole left breast was treated to 50.4 Gy in 28 fractions of 1.8 Gy 2.  The lumpectomy surgical site was treated to a total dose of 60.4 Gy with 5 additional fractions of 2 Gy       HISTORY OF PRESENTING ILLNESS (From Dr. Mariana Kaufman note on 09/21/2016):  Patient is seen, together with sign language interpreter, in follow up of slight new elevation in AST and ALT by labs in 08-2016, at 38 and 64 respectively. Fortunately, labs repeated for this visit are again in normal ranges. She has history of bilateral breast cancer, most recently triple negative in 12-2013; she has been on observation since completing adjuvant chemotherapy 09-2014. Most recent bilateral tomo mammograms were at [facility] 01-15-16.  Last breast MRI was 03-20-15. .  Gyn exam done by Dr Radene Knee spring 2017, yearly. She will have PE with Dr Lysle Rubens in 10-2016 She has no regular follow up with general surgery  Patient continues to feel well, as she did at visit 08-31-16. She had viral URI after last visit, which resolved entirely. Energy and appetite are fine, no fever, no respiratory or GI symptoms. Remainder of full 10 point Review of Systems negative/ unchanged from my note 08-31-16.  PAC removed with site infection near completion of chemo.  Flu vaccine 08-31-16 3 variants of uncertain significance by Breast Ovarian panel 01-2014 (ATM, [id number]; ATM, c.8674G>A and PMS2, C.1309C>T)  INTERIM HISTORY: Regina Watts presents for follow up. Pt is deaf and communicates through an interpreter. She is doing well today. She has a hard time sleeping, but this isn't new. She has occasional host flashes and night sweats. Denies any other concerns. No other medical problems or surgeries. She quit smoking in  1995. Not an alcohol drinker.   MEDICAL HISTORY:  Past Medical History:  Diagnosis Date  . Breast cancer ([place]) JANUARY 2002   RIGHT BREAST T4 SENTINEL NODE NEGATIVE, HER2 3+, ER/PR NEGATIVE  . Breast cancer [facility]) February 2015   Left Breast T1N0  . Congenital deafness   . Hx antineoplastic chemo   . Hx of radiation therapy   . Hyperlipidemia   . Wears glasses    to drive    SURGICAL  HISTORY: Past Surgical History:  Procedure Laterality Date  . BREAST LUMPECTOMY WITH NEEDLE LOCALIZATION AND AXILLARY SENTINEL LYMPH NODE BX Left 03/06/2014   Procedure: BREAST LUMPECTOMY WITH NEEDLE LOCALIZATION AND AXILLARY SENTINEL LYMPH NODE BIOPSY;  Surgeon: Shann Medal, MD;  Location: [place];  Service: General;  Laterality: Left;  . BREAST SURGERY  2002   lumpectomy on right side  . CESAREAN SECTION    . PORT-A-CATH REMOVAL Right 08/03/2014   Procedure: REMOVAL PORT-A-CATH;  Surgeon: Jackolyn Confer, MD;  Location: [facility];  Service: General;  Laterality: Right;  . PORTACATH PLACEMENT Right 03/06/2014   Procedure: INSERTION PORT-A-CATH;  Surgeon: Shann Medal, MD;  Location: [place];  Service: General;  Laterality: Right;  subclavian  . TONSILLECTOMY      SOCIAL HISTORY: Social History   Social History  . Marital status: Married    Spouse name: N/A  . Number of children: 1  . Years of education: N/A   Occupational History  . Not on file.   Social History Main Topics  . Smoking status: Former Smoker    Years: 3.00    Quit date: 01/11/1994  . Smokeless tobacco: Never Used  . Alcohol use Yes     Comment: very very seldom  . Drug use: No  . Sexual activity: Yes   Other Topics Concern  . Not on file   Social History Narrative  . No narrative on file    FAMILY HISTORY: Family History  Problem Relation Age of Onset  . Heart disease Mother   . Hypertension Mother   . Hyperlipidemia Mother   . Deafness Mother   . Liver cancer Maternal Aunt   . Liver cancer Maternal Grandfather   . Deafness Father   . Prostate cancer Father 68  . Deafness Sister   . Deafness Paternal Uncle   . Breast cancer Cousin     maternal cousin dx over 37  . Deafness Maternal Aunt   . Leukemia Cousin 2    ALLERGIES:  is allergic to taxotere [docetaxel].  MEDICATIONS:  Current Outpatient Prescriptions  Medication Sig Dispense Refill  .  acetaminophen (TYLENOL) 325 MG tablet Take 650 mg by mouth as needed.    Marland Kitchen atorvastatin (LIPITOR) 40 MG tablet Take 40 mg by mouth daily.  3  . metFORMIN (GLUCOPHAGE) 500 MG tablet TAKE 1 TABLET BY MOUTH EVERY DAY WITH MEALS  6  . pyridOXINE (VITAMIN B-6) 50 MG tablet Take 1 tablet (50 mg total) by mouth 2 (two) times daily. 60 tablet 3  . sertraline (ZOLOFT) 100 MG tablet Take 1 tablet (100 mg total) by mouth daily. 30 tablet 3  . Vitamin D, Ergocalciferol, (DRISDOL) 50000 units CAPS capsule Take 1 capsule (50,000 Units total) by mouth every 7 (seven) days. 8 capsule 0   No current facility-administered medications for this visit.     REVIEW OF SYSTEMS:   Constitutional: Denies fevers, chills (+) hot flashes, night sweats Eyes: Denies blurriness of vision, double  vision or watery eyes Ears, nose, mouth, throat, and face: Denies mucositis or sore throat Respiratory: Denies cough, dyspnea or wheezes Cardiovascular: Denies palpitation, chest discomfort or lower extremity swelling Gastrointestinal:  Denies nausea, heartburn or change in bowel habits Skin: Denies abnormal skin rashes Lymphatics: Denies new lymphadenopathy or easy bruising Neurological:Denies numbness, tingling or new weaknesses (+) insomnia Behavioral/Psych: Mood is stable, no new changes  All other systems were reviewed with the patient and are negative.  PHYSICAL EXAMINATION: ECOG PERFORMANCE STATUS: 0 - Asymptomatic  Vitals:   01/04/17 1313  BP: 127/70  Pulse: 70  Resp: 17  Temp: 97.8 F (36.6 C)   Filed Weights   01/04/17 1313  Weight: 171 lb (77.6 kg)   GENERAL:alert, no distress and comfortable SKIN: skin color, texture, turgor are normal, no rashes or significant lesions EYES: normal, conjunctiva are pink and non-injected, sclera clear OROPHARYNX:no exudate, no erythema and lips, buccal mucosa, and tongue normal  NECK: supple, thyroid normal size, non-tender, without nodularity LYMPH:  no palpable  lymphadenopathy in the cervical, axillary or inguinal LUNGS: clear to auscultation and percussion with normal breathing effort HEART: regular rate & rhythm and no murmurs and no lower extremity edema ABDOMEN:abdomen soft, non-tender and normal bowel sounds Musculoskeletal:no cyanosis of digits and no clubbing  PSYCH: alert & oriented x 3 with fluent speech NEURO: no focal motor/sensory deficits Breasts: Breast inspection showed them to be symmetrical with no nipple discharge. Palpation of the breasts and axilla revealed no obvious mass that I could appreciate. (+) Mild lymphedema of left breast, some skin pigment changes, b/l lumpectomy, incision sites healing well.   LABORATORY DATA:  I have reviewed the data as listed CBC Latest Ref Rng & Units 01/04/2017 08/31/2016 02/03/2016  WBC 3.9 - 10.3 10e3/uL 4.1 4.3 4.5  Hemoglobin 11.6 - 15.9 g/dL 14.4 13.7 13.4  Hematocrit 34.8 - 46.6 % 41.9 41.1 39.6  Platelets 145 - 400 10e3/uL 155 142(L) 143(L)    CMP Latest Ref Rng & Units 01/04/2017 09/15/2016 08/31/2016  Glucose 70 - 140 mg/dl 149(H) 123 142(H)  BUN 7.0 - 26.0 mg/dL 15.7 24.1 9.7  Creatinine 0.6 - 1.1 mg/dL 0.8 0.8 0.8  Sodium 136 - 145 mEq/L 140 142 141  Potassium 3.5 - 5.1 mEq/L 4.3 4.0 4.3  Chloride 96 - 112 mEq/L - - -  CO2 22 - 29 mEq/L '24 25 24  ' Calcium 8.4 - 10.4 mg/dL 9.7 10.0 9.0  Total Protein 6.4 - 8.3 g/dL 7.1 7.2 6.7  Total Bilirubin 0.20 - 1.20 mg/dL 0.52 0.67 0.44  Alkaline Phos 40 - 150 U/L 116 141 129  AST 5 - 34 U/L 43(H) 28 38(H)  ALT 0 - 55 U/L 79(H) 54 64(H)   PATHOLOGY:  Diagnosis 03/06/2014 1. Lymph node, sentinel, biopsy, Left axillary - ONE BENIGN LYMPH NODE WITH NO TUMOR SEEN (0/1). 2. Breast, lumpectomy, Left - INVASIVE GRADE II DUCTAL CARCINOMA SPANNING 1.8 CM IN GREATEST DIMENSION. - INVASIVE TUMOR IS EXTREMELY CLOSE (0.1 CM) TO POSTERIOR MARGIN ON INITIAL LUMPECTOMY SPECIMEN, PLEASE SEE SPECIMEN # 3 FOR FINAL MARGIN STATUS. - OTHER MARGINS ARE  NEGATIVE. - SEE ONCOLOGY TEMPLATE. 3. Breast, excision, Left posterior margin - BENIGN BREAST PARENCHYMA. - NO TUMOR SEEN.  Diagnosis 01/01/2014 Breast, left, needle core biopsy, mass, 10 o'clock - INVASIVE DUCTAL CARCINOMA. - SEE COMMENT. Microscopic Comment Although definitive grading of breast carcinoma is best done on excision, the features of the invasive tumor from the left needle core biopsy at 10 o'clock are  compatible with a grade I-II breast carcinoma. Breast prognostic markers will be performed and reported in an addendum. Findings are called to the [facility] on 01/02/14. Dr. Donato Heinz has seen this case in consultation with agreement. (RAH:gt, 01/02/14)  RADIOGRAPHIC STUDIES: I have personally reviewed the radiological images as listed and agreed with the findings in the report.  Breast MRI b/l 06/09/2016 IMPRESSION: Post lumpectomy changes bilaterally. No MRI evidence of malignancy. Scarring in the upper medial right breast.  Diagnostic Mammogram b/l 01/15/2016 IMPRESSION: 1. Stable bilateral lumpectomy sites. No evidence of disease recurrence.  2. No mammographic evidence of malignancy in the bilateral breasts.  ASSESSMENT & PLAN:  Ms. Lemler is a 56 y.o. female with:  1.Triple negative left breast cancer T1cN0 diagnosed 12-2013, history of stage II right breast cancer HR-/HER2+ in 2002 -I have reviewed her medical history extensively, and confirmed the findings with patient  -She had a history of triple negative breast cancer, stage I A, treated with lumpectomy and sentinel node evaluation 03-06-14. Adjuvant chemotherapy completed 09-10-2014; RT completed 12-11-14.  -history T2N0M0 right breast cancer 11-2000 at age 88 treated with lumpectomy, 2 sentinel nodes, chemotherapy and  -She is clinically doing very well, lab results reviewed with her, mild transaminitis, otherwise normal, exam was unremarkable, her last mammogram and breast MRI were unremarkable. No  clinical concern for recurrence -She is 3 years out of her initial diagnosis of left breast cancer, we discussed the risk of recurrence is much small after 3 years for triple negative disease. All continue surveillance for total of 5 years. -We'll continue annual screening mammogram, she is due next month -Due to her high co-pay, or do bilateral breast MRI every other year, next due in August 2019 -I encouraged her to do self exam, I'll follow-up every 6 months   2. congenital deafness.  -Communicates by reading/ writing and sign language.   3. Genetics -multiple genetic variants of unknown significance on genetic testing 01-2014   4. peripheral neuropathy  -related to taxanes -Symptoms gradually improving, still slight in feet.   5. Mild transaminitis -She has had mild elevated ALT and AST since last year, resolved on her last visit, slightly elevated again today. -Unlikely related to her history of breast cancer. We'll continue monitoring. If it gets worse, we'll consider scan.  6. DM -Patient has been told she is borderline diabetic.  -She is on medication for this. -Fasting blood glucose today was 146.  -Followed by PCP  7. Vitamin D Deficiency  -She has not taken calcium or vitamin D in 3-4 years. -I have encouraged her to take vitamin D and calcium -Her last menstrual cycle was in 2011-2012.  -Prescribe 50,000 units a day for 2 months.    Plan -Prescribe high dose vitamin D daily. Switch to over the counter after 2 months.  -Mammogram is scheduled for March 2018. -Breast MRI in August 2019 -Return in 6 months for labs and f/u.    All questions were answered. The patient knows to call the clinic with any problems, questions or concerns.  I spent 25 minutes counseling the patient face to face. The total time spent in the appointment was 30 minutes and more than 50% was on counseling.  This document serves as a record of services personally performed by Truitt Merle, MD. It  was created on her behalf by Martinique Casey, a trained medical scribe. The creation of this record is based on the scribe's personal observations and the provider's statements to  them. This document has been checked and approved by the attending provider.  I have reviewed the above documentation for accuracy and completeness and I agree with the above.   Truitt Merle, MD   2:21 PM

## 2017-01-04 ENCOUNTER — Encounter: Payer: Self-pay | Admitting: Hematology

## 2017-01-04 ENCOUNTER — Other Ambulatory Visit (HOSPITAL_BASED_OUTPATIENT_CLINIC_OR_DEPARTMENT_OTHER): Payer: BLUE CROSS/BLUE SHIELD

## 2017-01-04 ENCOUNTER — Ambulatory Visit (HOSPITAL_BASED_OUTPATIENT_CLINIC_OR_DEPARTMENT_OTHER): Payer: BLUE CROSS/BLUE SHIELD | Admitting: Hematology

## 2017-01-04 VITALS — BP 127/70 | HR 70 | Temp 97.8°F | Resp 17 | Ht 67.0 in | Wt 171.0 lb

## 2017-01-04 DIAGNOSIS — R740 Nonspecific elevation of levels of transaminase and lactic acid dehydrogenase [LDH]: Secondary | ICD-10-CM

## 2017-01-04 DIAGNOSIS — E559 Vitamin D deficiency, unspecified: Secondary | ICD-10-CM | POA: Diagnosis not present

## 2017-01-04 DIAGNOSIS — G620 Drug-induced polyneuropathy: Secondary | ICD-10-CM

## 2017-01-04 DIAGNOSIS — Z171 Estrogen receptor negative status [ER-]: Secondary | ICD-10-CM

## 2017-01-04 DIAGNOSIS — C50212 Malignant neoplasm of upper-inner quadrant of left female breast: Secondary | ICD-10-CM | POA: Diagnosis not present

## 2017-01-04 DIAGNOSIS — E119 Type 2 diabetes mellitus without complications: Secondary | ICD-10-CM

## 2017-01-04 DIAGNOSIS — H905 Unspecified sensorineural hearing loss: Secondary | ICD-10-CM | POA: Diagnosis not present

## 2017-01-04 LAB — COMPREHENSIVE METABOLIC PANEL
ALBUMIN: 4.2 g/dL (ref 3.5–5.0)
ALT: 79 U/L — ABNORMAL HIGH (ref 0–55)
AST: 43 U/L — ABNORMAL HIGH (ref 5–34)
Alkaline Phosphatase: 116 U/L (ref 40–150)
Anion Gap: 8 mEq/L (ref 3–11)
BUN: 15.7 mg/dL (ref 7.0–26.0)
CALCIUM: 9.7 mg/dL (ref 8.4–10.4)
CO2: 24 mEq/L (ref 22–29)
Chloride: 109 mEq/L (ref 98–109)
Creatinine: 0.8 mg/dL (ref 0.6–1.1)
EGFR: 81 mL/min/{1.73_m2} — AB (ref >90)
Glucose: 149 mg/dl — ABNORMAL HIGH (ref 70–140)
Potassium: 4.3 mEq/L (ref 3.5–5.1)
Sodium: 140 mEq/L (ref 136–145)
Total Bilirubin: 0.52 mg/dL (ref 0.20–1.20)
Total Protein: 7.1 g/dL (ref 6.4–8.3)

## 2017-01-04 LAB — CBC WITH DIFFERENTIAL/PLATELET
BASO%: 0.6 % (ref 0.0–2.0)
BASOS ABS: 0 10*3/uL (ref 0.0–0.1)
EOS ABS: 0.2 10*3/uL (ref 0.0–0.5)
EOS%: 4.4 % (ref 0.0–7.0)
HEMATOCRIT: 41.9 % (ref 34.8–46.6)
HEMOGLOBIN: 14.4 g/dL (ref 11.6–15.9)
LYMPH#: 1.1 10*3/uL (ref 0.9–3.3)
LYMPH%: 26.7 % (ref 14.0–49.7)
MCH: 32 pg (ref 25.1–34.0)
MCHC: 34.3 g/dL (ref 31.5–36.0)
MCV: 93.1 fL (ref 79.5–101.0)
MONO#: 0.3 10*3/uL (ref 0.1–0.9)
MONO%: 7.5 % (ref 0.0–14.0)
NEUT#: 2.5 10*3/uL (ref 1.5–6.5)
NEUT%: 60.8 % (ref 38.4–76.8)
Platelets: 155 10*3/uL (ref 145–400)
RBC: 4.5 10*6/uL (ref 3.70–5.45)
RDW: 13.4 % (ref 11.2–14.5)
WBC: 4.1 10*3/uL (ref 3.9–10.3)

## 2017-01-04 MED ORDER — VITAMIN D (ERGOCALCIFEROL) 1.25 MG (50000 UNIT) PO CAPS: 50000.0000 [IU] | ORAL_CAPSULE | ORAL | 0 refills | Status: DC

## 2017-01-13 ENCOUNTER — Other Ambulatory Visit: Payer: Self-pay | Admitting: Oncology

## 2017-01-13 DIAGNOSIS — Z853 Personal history of malignant neoplasm of breast: Secondary | ICD-10-CM

## 2017-01-15 ENCOUNTER — Ambulatory Visit
Admission: RE | Admit: 2017-01-15 | Discharge: 2017-01-15 | Disposition: A | Discharge status: Home / Self Care | Payer: Medicare Other | Source: Ambulatory Visit | Attending: Oncology | Admitting: Oncology

## 2017-01-15 DIAGNOSIS — Z1231 Encounter for screening mammogram for malignant neoplasm of breast: Secondary | ICD-10-CM

## 2017-01-15 DIAGNOSIS — Z853 Personal history of malignant neoplasm of breast: Secondary | ICD-10-CM

## 2017-02-01 ENCOUNTER — Telehealth: Payer: Self-pay | Admitting: Hematology

## 2017-02-01 NOTE — Telephone Encounter (Signed)
Left message through sign language interpreter service to advise patient of appointment 06/28/17 at 1:00pm for labs and 1:30pm with Dr. Burr Medico

## 2017-02-02 ENCOUNTER — Telehealth: Payer: Self-pay | Admitting: Hematology

## 2017-02-02 NOTE — Telephone Encounter (Signed)
Pt called to r/s 8/20 appt to 8/29 at 1115 am

## 2017-03-02 ENCOUNTER — Telehealth: Payer: Self-pay | Admitting: *Deleted

## 2017-03-02 NOTE — Telephone Encounter (Signed)
Received fax request for Vit D 50,000 IU refill from [facility].  Reviewed chart & pt was to be on 50,000 x 2 mo & then switch to OTC.  Message left on pt's vm per Interpreter to this effect.

## 2017-03-25 ENCOUNTER — Other Ambulatory Visit: Payer: Self-pay | Admitting: Internal Medicine

## 2017-03-25 DIAGNOSIS — R519 Headache, unspecified: Secondary | ICD-10-CM

## 2017-03-25 DIAGNOSIS — S0093XA Contusion of unspecified part of head, initial encounter: Secondary | ICD-10-CM

## 2017-03-25 DIAGNOSIS — S0003XA Contusion of scalp, initial encounter: Secondary | ICD-10-CM | POA: Diagnosis not present

## 2017-03-25 DIAGNOSIS — R51 Headache: Principal | ICD-10-CM

## 2017-03-25 DIAGNOSIS — S0990XA Unspecified injury of head, initial encounter: Secondary | ICD-10-CM | POA: Diagnosis not present

## 2017-03-29 ENCOUNTER — Ambulatory Visit
Admission: RE | Admit: 2017-03-29 | Discharge: 2017-03-29 | Disposition: A | Discharge status: Home / Self Care | Payer: BLUE CROSS/BLUE SHIELD | Source: Ambulatory Visit | Attending: Internal Medicine | Admitting: Internal Medicine

## 2017-03-29 DIAGNOSIS — R519 Headache, unspecified: Secondary | ICD-10-CM

## 2017-03-29 DIAGNOSIS — R51 Headache: Secondary | ICD-10-CM | POA: Diagnosis not present

## 2017-03-29 DIAGNOSIS — S0093XA Contusion of unspecified part of head, initial encounter: Secondary | ICD-10-CM

## 2017-04-09 ENCOUNTER — Encounter: Payer: Self-pay | Admitting: Genetic Counselor

## 2017-04-21 DIAGNOSIS — F411 Generalized anxiety disorder: Secondary | ICD-10-CM | POA: Diagnosis not present

## 2017-04-21 DIAGNOSIS — G620 Drug-induced polyneuropathy: Secondary | ICD-10-CM | POA: Diagnosis not present

## 2017-04-21 DIAGNOSIS — C50912 Malignant neoplasm of unspecified site of left female breast: Secondary | ICD-10-CM | POA: Diagnosis not present

## 2017-04-21 DIAGNOSIS — E782 Mixed hyperlipidemia: Secondary | ICD-10-CM | POA: Diagnosis not present

## 2017-04-21 DIAGNOSIS — R7303 Prediabetes: Secondary | ICD-10-CM | POA: Diagnosis not present

## 2017-05-24 DIAGNOSIS — Z01419 Encounter for gynecological examination (general) (routine) without abnormal findings: Secondary | ICD-10-CM | POA: Diagnosis not present

## 2017-05-24 DIAGNOSIS — Z6827 Body mass index (BMI) 27.0-27.9, adult: Secondary | ICD-10-CM | POA: Diagnosis not present

## 2017-06-28 ENCOUNTER — Other Ambulatory Visit: Payer: BLUE CROSS/BLUE SHIELD

## 2017-06-28 ENCOUNTER — Ambulatory Visit: Payer: BLUE CROSS/BLUE SHIELD | Admitting: Hematology

## 2017-07-07 ENCOUNTER — Ambulatory Visit (HOSPITAL_BASED_OUTPATIENT_CLINIC_OR_DEPARTMENT_OTHER): Payer: BLUE CROSS/BLUE SHIELD | Admitting: Hematology

## 2017-07-07 ENCOUNTER — Other Ambulatory Visit (HOSPITAL_BASED_OUTPATIENT_CLINIC_OR_DEPARTMENT_OTHER): Payer: BLUE CROSS/BLUE SHIELD

## 2017-07-07 ENCOUNTER — Telehealth: Payer: Self-pay | Admitting: Hematology

## 2017-07-07 ENCOUNTER — Encounter: Payer: Self-pay | Admitting: Hematology

## 2017-07-07 VITALS — BP 121/68 | HR 82 | Temp 98.6°F | Resp 18 | Ht 67.0 in | Wt 178.9 lb

## 2017-07-07 DIAGNOSIS — C50212 Malignant neoplasm of upper-inner quadrant of left female breast: Secondary | ICD-10-CM

## 2017-07-07 DIAGNOSIS — Z171 Estrogen receptor negative status [ER-]: Secondary | ICD-10-CM

## 2017-07-07 DIAGNOSIS — E119 Type 2 diabetes mellitus without complications: Secondary | ICD-10-CM | POA: Diagnosis not present

## 2017-07-07 DIAGNOSIS — E559 Vitamin D deficiency, unspecified: Secondary | ICD-10-CM

## 2017-07-07 LAB — CBC WITH DIFFERENTIAL/PLATELET
BASO%: 0.3 % (ref 0.0–2.0)
BASOS ABS: 0 10*3/uL (ref 0.0–0.1)
EOS%: 2 % (ref 0.0–7.0)
Eosinophils Absolute: 0.1 10*3/uL (ref 0.0–0.5)
HCT: 41.9 % (ref 34.8–46.6)
HEMOGLOBIN: 14.2 g/dL (ref 11.6–15.9)
LYMPH#: 0.8 10*3/uL — AB (ref 0.9–3.3)
LYMPH%: 15 % (ref 14.0–49.7)
MCH: 31.8 pg (ref 25.1–34.0)
MCHC: 34 g/dL (ref 31.5–36.0)
MCV: 93.5 fL (ref 79.5–101.0)
MONO#: 0.3 10*3/uL (ref 0.1–0.9)
MONO%: 5.6 % (ref 0.0–14.0)
NEUT%: 77.1 % — ABNORMAL HIGH (ref 38.4–76.8)
NEUTROS ABS: 4 10*3/uL (ref 1.5–6.5)
Platelets: 152 10*3/uL (ref 145–400)
RBC: 4.48 10*6/uL (ref 3.70–5.45)
RDW: 13.4 % (ref 11.2–14.5)
WBC: 5.2 10*3/uL (ref 3.9–10.3)

## 2017-07-07 LAB — COMPREHENSIVE METABOLIC PANEL
ALT: 107 U/L — AB (ref 0–55)
ANION GAP: 8 meq/L (ref 3–11)
AST: 52 U/L — ABNORMAL HIGH (ref 5–34)
Albumin: 3.9 g/dL (ref 3.5–5.0)
Alkaline Phosphatase: 102 U/L (ref 40–150)
BUN: 15.8 mg/dL (ref 7.0–26.0)
CALCIUM: 10.1 mg/dL (ref 8.4–10.4)
CO2: 29 mEq/L (ref 22–29)
CREATININE: 0.9 mg/dL (ref 0.6–1.1)
Chloride: 105 mEq/L (ref 98–109)
EGFR: 71 mL/min/{1.73_m2} — ABNORMAL LOW (ref >90)
GLUCOSE: 159 mg/dL — AB (ref 70–140)
POTASSIUM: 3.8 meq/L (ref 3.5–5.1)
SODIUM: 142 meq/L (ref 136–145)
TOTAL PROTEIN: 7.2 g/dL (ref 6.4–8.3)
Total Bilirubin: 0.86 mg/dL (ref 0.20–1.20)

## 2017-07-07 NOTE — Telephone Encounter (Signed)
Gave patient avs and calendar with upcoming appts.  °

## 2017-07-07 NOTE — Progress Notes (Addendum)
Regina Watts  [phone]) [phone] [phone]  Clinic Follow Up Note   Patient Care Team: Regina Low, MD as PCP - General (Internal Medicine) Regina Levan, MD as Consulting Physician (Oncology) Regina Pita, MD as Consulting Physician (Radiation Oncology) Regina Overall, MD as Consulting Physician (General Surgery) 07/07/2017  CHIEF COMPLAINTS:  F/u Triple negative left breast cancer    Breast cancer of upper-inner quadrant of left female breast ([place])   12/2013 Initial Diagnosis    Triple negative malignant neoplasm of breast ([place])      01/01/2014 Pathology Results    Breast, left, needle core biopsy, mass, 10 o'clock - INVASIVE DUCTAL CARCINOMA.      01/30/2014 Genetic Testing    PMS2 c.1309C>T, ATM c.8674G>A and ATM [phone] VUS's found on the Breast/Ovarian cancer panel.  The ATM [phone] VUS was identified as a mosaic result. The Breast/Ovarian gene panel offered by [facility] includes sequencing and rearrangement analysis for the following 21 genes:  ATM, BARD1, BRCA1, BRCA2, BRIP1, CDH1, CHEK2, EPCAM, FANCC, MLH1, MSH2, MSH6, NBN, PALB2, PMS2, PTEN, RAD51C, RAD51D, STK11, TP53, and XRCC2.     UPDATE: PMS2 c.1309C>T VUS has been reclassified from a variant of uncertain significance to a likely benign variant based on a combination of sources, e.g., internal data, published literature, population databases and in [place].  The updated report date is Apr 08, 2017.       03/06/2014 Surgery    S/p Lumpectomy by Regina. Lucia Watts at [facility].       03/06/2014 Pathology Results    1. Lymph node, sentinel, biopsy, Left axillary - ONE BENIGN LYMPH NODE WITH NO TUMOR SEEN (0/1). 2. Breast, lumpectomy, Left - INVASIVE GRADE II DUCTAL CARCINOMA SPANNING 1.8 CM IN GREATEST DIMENSION. - INVASIVE TUMOR IS EXTREMELY CLOSE (0.1 CM) TO POSTERIOR MARGIN ON INITIAL LUMPECTOMY SPECIMEN, PLEASE SEE SPECIMEN # 3 FOR FINAL MARGIN STATUS. -  OTHER MARGINS ARE NEGATIVE. - SEE ONCOLOGY TEMPLATE. 3. Breast, excision, Left posterior margin - BENIGN BREAST PARENCHYMA. - NO TUMOR SEEN.      03/06/2014 Receptors her2    Estrogen receptor: negative. Progesterone receptor: negative. HER-2: negative      03/30/2014 - 04/14/2014 Chemotherapy    The patient had adjuvant chemotherapy treatment, Cytoxan and Adriamycin for 4 cycles then Taxol for 12 weeks       10/22/2014 - 12/11/2014 Radiation Therapy     The patient saw Regina. Tammi Watts for radiation treatment. This is the current list of radiation treatment:  1.  The whole left breast was treated to 50.4 Gy in 28 fractions of 1.8 Gy 2.  The lumpectomy surgical site was treated to a total dose of 60.4 Gy with 5 additional fractions of 2 Gy      HISTORY OF PRESENTING ILLNESS (From Regina. [name] note on 09/21/2016):  Patient is seen, together with sign language interpreter, in follow up of slight new elevation in AST and ALT by labs in 08-2016, at 38 and 64 respectively. Fortunately, labs repeated for this visit are again in normal ranges. She has history of bilateral breast cancer, most recently triple negative in 12-2013; she has been on observation since completing adjuvant chemotherapy 09-2014. Most recent bilateral tomo mammograms were at [facility] 01/15/2017.  Last breast MRI was 06/09/2016  Gyn exam done by Regina Watts spring 2017, yearly. She will have PE with Regina Watts in 10-2016 She has no regular follow up with general surgery  Patient continues to feel well, as she did  at visit 08-31-16. She had viral URI after last visit, which resolved entirely. Energy and appetite are fine, no fever, no respiratory or GI symptoms. Remainder of full 10 point Review of Systems negative/ unchanged from my note 08-31-16.  PAC removed with site infection near completion of chemo.  Flu vaccine 08-31-16 3 variants of uncertain significance by Breast Ovarian panel 01-2014 (ATM, [id number];  ATM, c.8674G>A and PMS2, C.1309C>T)  INTERIM HISTORY:  Regina Watts presents for follow up as scheduled. Pt is hearing impaired and communicates through an American sign language interpreter. She has a good appetite and has gained 7 pounds by our scale over the last 6 months. She is feeling well Watts and has no new concerns. She has persistent neuropathy in both hands that makes it difficult to perform certain functions such as buttoning clothes, and gets worse with hot temperature as when washing dishes. This is not gotten worse but has not improved, although she has adapted to it. She has tried gabapentin in the past, but stopped due to no improvement. She denies neuropathy in both feet. She denies breast changes or concerns, no upper extremity swelling. She denies fatigue, fever, chills, GI, cardiac, or respiratory complaints.  MEDICAL HISTORY:  Past Medical History:  Diagnosis Date  . Breast cancer ([place]) JANUARY 2002   RIGHT BREAST T4 SENTINEL NODE NEGATIVE, HER2 3+, ER/PR NEGATIVE  . Breast cancer [facility]) February 2015   Left Breast T1N0  . Congenital deafness   . Hx antineoplastic chemo   . Hx of radiation therapy   . Hyperlipidemia   . Wears glasses    to drive    SURGICAL HISTORY: Past Surgical History:  Procedure Laterality Date  . BREAST LUMPECTOMY Left 03/06/2014  . BREAST LUMPECTOMY Right 11/2000  . BREAST LUMPECTOMY WITH NEEDLE LOCALIZATION AND AXILLARY SENTINEL LYMPH NODE BX Left 03/06/2014   Procedure: BREAST LUMPECTOMY WITH NEEDLE LOCALIZATION AND AXILLARY SENTINEL LYMPH NODE BIOPSY;  Surgeon: Shann Medal, MD;  Location: [facility];  Service: General;  Laterality: Left;  . BREAST SURGERY  2002   lumpectomy on right side  . CESAREAN SECTION    . PORT-A-CATH REMOVAL Right 08/03/2014   Procedure: REMOVAL PORT-A-CATH;  Surgeon: Jackolyn Confer, MD;  Location: [facility];  Service: General;  Laterality: Right;  . PORTACATH PLACEMENT Right 03/06/2014   Procedure:  INSERTION PORT-A-CATH;  Surgeon: Shann Medal, MD;  Location: [facility];  Service: General;  Laterality: Right;  subclavian  . TONSILLECTOMY      SOCIAL HISTORY: Social History   Social History  . Marital status: Married    Spouse name: N/A  . Number of children: 1  . Years of education: N/A   Occupational History  . Not on file.   Social History Main Topics  . Smoking status: Former Smoker    Years: 3.00    Quit date: 01/11/1994  . Smokeless tobacco: Never Used  . Alcohol use Yes     Comment: very very seldom  . Drug use: No  . Sexual activity: Yes   Other Topics Concern  . Not on file   Social History Narrative  . No narrative on file    FAMILY HISTORY: Family History  Problem Relation Age of Onset  . Heart disease Mother   . Hypertension Mother   . Hyperlipidemia Mother   . Deafness Mother   . Liver cancer Maternal Aunt   . Liver cancer Maternal Grandfather   . Deafness Father   .  Prostate cancer Father 75  . Deafness Sister   . Deafness Paternal Uncle   . Breast cancer Cousin        maternal cousin dx over 31  . Deafness Maternal Aunt   . Leukemia Cousin 2    ALLERGIES:  is allergic to taxotere [docetaxel].  MEDICATIONS:  Current Outpatient Prescriptions  Medication Sig Dispense Refill  . acetaminophen (TYLENOL) 325 MG tablet Take 650 mg by mouth as needed.    Marland Kitchen atorvastatin (LIPITOR) 40 MG tablet Take 40 mg by mouth daily.  3  . metFORMIN (GLUCOPHAGE) 500 MG tablet TAKE 1 TABLET BY MOUTH EVERY DAY WITH MEALS  6  . pyridOXINE (VITAMIN B-6) 50 MG tablet Take 1 tablet (50 mg total) by mouth 2 (two) times daily. 60 tablet 3  . sertraline (ZOLOFT) 100 MG tablet Take 1 tablet (100 mg total) by mouth daily. 30 tablet 3  . Vitamin D, Ergocalciferol, (DRISDOL) 50000 units CAPS capsule Take 1 capsule (50,000 Units total) by mouth every 7 (seven) days. 8 capsule 0   No current facility-administered medications for this visit.     REVIEW OF  SYSTEMS:  Constitutional: Denies fevers, chills (+) hot flashes, night sweats Eyes: Denies blurriness of vision, double vision or watery eyes Ears, nose, mouth, throat, and face: Denies mucositis or sore throat Respiratory: Denies cough, dyspnea or wheezes Cardiovascular: Denies palpitation, chest discomfort or lower extremity swelling Gastrointestinal:  Denies nausea, Vomiting, constipation, diarrhea, heartburn or change in bowel habits Skin: Denies abnormal skin rashes Lymphatics: Denies new lymphadenopathy or easy bruising Neurological:(+) numbness and tingling bilaterally to hands, none in feet. No new weaknesses. Behavioral/Psych: Mood is stable, no new changes  Breasts: Denies pain, skin changes, discharge, or dimpling. All other systems were reviewed with the patient and are negative.   PHYSICAL EXAMINATION: ECOG PERFORMANCE STATUS: 0 - Asymptomatic BP 121/68 (BP Location: Left Arm, Patient Position: Sitting)   Pulse 82   Temp 98.6 F (37 C) (Oral)   Resp 18   Ht _0  (1.702 m)   Wt 178 lb 14.4 oz (81.1 kg)   SpO2 97%   BMI 28.02 kg/m   GENERAL:alert, no distress and comfortable SKIN: skin color, texture, turgor are normal, no rashes or significant lesions EYES: normal, conjunctiva are pink and non-injected, sclera clear OROPHARYNX:no exudate, no erythema and lips, buccal mucosa, and tongue normal  NECK: supple, thyroid normal size, non-tender, without nodularity LYMPH:  no palpable lymphadenopathy in the cervical, axillary or inguinal LUNGS: clear to auscultation and percussion with normal breathing effort HEART: regular rate & rhythm and no murmurs and no lower extremity edema ABDOMEN:abdomen soft, non-tender and normal bowel sounds Musculoskeletal:no cyanosis of digits and no clubbing  PSYCH: alert & oriented x 3 with fluent speech NEURO: no focal motor/sensory deficits Breasts: Breast inspection shows them to be asymmetrical with no nipple discharge. Left larger  than right. Palpation of the breasts and axilla revealed no obvious mass that I could appreciate. (+) scar tissue to both breasts surgical sites; incisions well-healed   LABORATORY DATA:  I have reviewed the data as listed  CBC Latest Ref Rng & Units 07/07/2017 01/04/2017 08/31/2016  WBC 3.9 - 10.3 10e3/uL 5.2 4.1 4.3  Hemoglobin 11.6 - 15.9 g/dL 14.2 14.4 13.7  Hematocrit 34.8 - 46.6 % 41.9 41.9 41.1  Platelets 145 - 400 10e3/uL 152 155 142(L)    CMP Latest Ref Rng & Units 07/07/2017 01/04/2017 09/15/2016  Glucose 70 - 140 mg/dl 159(H) 149(H) 123  BUN 7.0 - 26.0 mg/dL 15.8 15.7 24.1  Creatinine 0.6 - 1.1 mg/dL 0.9 0.8 0.8  Sodium 136 - 145 mEq/L 142 140 142  Potassium 3.5 - 5.1 mEq/L 3.8 4.3 4.0  Chloride 96 - 112 mEq/L - - -  CO2 22 - 29 mEq/L _0 Calcium 8.4 - 10.4 mg/dL 10.1 9.7 10.0  Total Protein 6.4 - 8.3 g/dL 7.2 7.1 7.2  Total Bilirubin 0.20 - 1.20 mg/dL 0.86 0.52 0.67  Alkaline Phos 40 - 150 U/L 102 116 141  AST 5 - 34 U/L 52(H) 43(H) 28  ALT 0 - 55 U/L 107(H) 79(H) 54    PATHOLOGY:  Diagnosis 03/06/2014 1. Lymph node, sentinel, biopsy, Left axillary - ONE BENIGN LYMPH NODE WITH NO TUMOR SEEN (0/1). 2. Breast, lumpectomy, Left - INVASIVE GRADE II DUCTAL CARCINOMA SPANNING 1.8 CM IN GREATEST DIMENSION. - INVASIVE TUMOR IS EXTREMELY CLOSE (0.1 CM) TO POSTERIOR MARGIN ON INITIAL LUMPECTOMY SPECIMEN, PLEASE SEE SPECIMEN # 3 FOR FINAL MARGIN STATUS. - OTHER MARGINS ARE NEGATIVE. - SEE ONCOLOGY TEMPLATE. 3. Breast, excision, Left posterior margin - BENIGN BREAST PARENCHYMA. - NO TUMOR SEEN.  Diagnosis 01/01/2014 Breast, left, needle core biopsy, mass, 10 o'clock - INVASIVE DUCTAL CARCINOMA. - SEE COMMENT. Microscopic Comment Although definitive grading of breast carcinoma is best done on excision, the features of the invasive tumor from the left needle core biopsy at 10 o'clock are compatible with a grade I-II breast carcinoma. Breast prognostic markers will be  performed and reported in an addendum. Findings are called to the [place] on 01/02/14. Regina. Donato Heinz has seen this case in consultation with agreement. (RAH:gt, 01/02/14)  RADIOGRAPHIC STUDIES: I have personally reviewed the radiological images as listed and agreed with the findings in the report.  Breast MRI b/l 06/09/2016 IMPRESSION: Post lumpectomy changes bilaterally. No MRI evidence of malignancy. Scarring in the upper medial right breast  Diagnostic Mammogram 01/15/17 IMPRESSION: No evidence of malignancy within either breast. Stable postsurgical changes within each breast. RECOMMENDATION: Bilateral diagnostic mammogram in 1 year  ASSESSMENT & PLAN:  Ms. Hitchman is a 56 y.o. female with History of triple negative left cancer status post chemotherapy completion in 2015 and radiation completed in 2016. She feels well Watts with no side effects of treatment with the exception of persistent upper extremity neuropathy.   1.Triple negative left breast cancer T1cN0 diagnosed 12-2013, history of stage II right breast cancer HR-/HER2+ in 2002 -MD reviewed her medical history extensively, and confirmed the findings with patient  -She had a history of triple negative breast cancer, stage I A, treated with lumpectomy and sentinel node evaluation 03-06-14. Adjuvant chemotherapy completed 09-10-2014; RT completed 12-11-14.  -history T2N0M0 right breast cancer 11-2000 at age 75 treated with lumpectomy, 2 sentinel nodes, chemotherapy and  -She is 3 years out of her initial diagnosis of left breast cancer, we discussed the risk of recurrence is much small after 3 years for triple negative disease. All continue surveillance for total of 5 years. -Clinically she is doing well without evidence of recurrence.  -Her last mammogram and breast MRI were unremarkable.  -She is encouraged to do breast self-exam -She will have a bilateral breast MRI with/without contrast in 07/28/2017. -She'll be due  annual diagnostic breast TOMO mammogram in 01/2018. -She will return to clinic in 6 months  2. congenital deafness.  -Communicates by reading/ writing and sign language.   3. Genetics -multiple genetic variants of unknown significance on genetic testing 01-2014  4. peripheral neuropathy  -related to taxanes -Tried Gabapentin without improvement.  -Symptoms resolved in feet -Numbness and tingling to hands bilaterally; patient has acclimated to this; will monitor.    5. Mild transaminitis -She has had mild elevated ALT and AST since last year, slightly elevated again today. -Will continue to monitor. If this continues to be elevated, may consider liver ultrasound.  6. DM -Patient has been told she is borderline diabetic.  -She is on Metformin. -Blood glucose today was 159.  -Followed by PCP  7. Vitamin D Deficiency  -Her last menstrual cycle was in 2011-2012.  -previously Prescribed 50,000 units weekly -She has been off vitamin D therapy, we will recheck at her next office visit n 6 months.  8. Health Maintenance -She sees her PCP regularly, who monitors her blood sugar -She reports wearing sunscreen and hats while out in the sun -She reports having a colonoscopy 2 or 3 years ago, with a 10 year follow-up -She reports a normal check up from her gynecologist one month ago  9. Hepatic steatosis and mild transaminitis -Her prior CT abdomen and liver MRI in 2015 showed hepatic stenosis and a 1.2cm triangular subcapsular medial segment of left hepatic lobe area of enhancement, which is unchanged since 2008, likely represent perfusion phenomena.  -She has some mild intermittent transaminitis since 2015, likely segments to hepatic steatosis, Watts stable, we'll continue monitoring. May consider repeat a [place] on next visit if worsening LFTs   Plan -Breast MRI 07/28/2017  -Mammogram due March 2019  -Return to clinic in 6 months with lab and office visit  -Vitamin D level with next  office visit  -Referral to plastic surgery, Regina. [place] Planas -Possible liver u/s if persistently elevated transaminases at next visit.  All questions were answered. The patient knows to call the clinic with any problems, questions or concerns.  I spent 25 minutes counseling the patient face to face. The total time spent in the appointment was 30 minutes and more than 50% was on counseling.  Cira Rue, AGNP-C 07/07/2017  This document serves as a record of services personally performed by Truitt Merle, MD. It was created on her behalf by Joslyn Devon, a trained medical scribe. The creation of this record is based on the scribe's personal observations and the provider's statements to them. This document has been checked and approved by the attending provider.   Attending addendum  I have seen the patient, examined her. I agree with the assessment and and plan and have edited the notes.   Regina Watts is doing well, lab reviewed, slightly worse transaminitis, otherwise unremarkable, physical exam was unremarkable, no current concern for cancer recurrence. We'll continue breast cancer surveillance.  Truitt Merle  07/07/2017

## 2017-07-09 ENCOUNTER — Telehealth: Payer: Self-pay | Admitting: Hematology

## 2017-07-09 NOTE — Telephone Encounter (Signed)
Left voicemail for appt with Dr.Thimmappa.

## 2017-07-14 ENCOUNTER — Telehealth: Payer: Self-pay | Admitting: Hematology

## 2017-07-14 NOTE — Telephone Encounter (Signed)
Faxed records to Dr. Thimmappa's office. °

## 2017-07-28 ENCOUNTER — Ambulatory Visit
Admission: RE | Admit: 2017-07-28 | Discharge: 2017-07-28 | Disposition: A | Discharge status: Home / Self Care | Payer: Medicare Other | Source: Ambulatory Visit | Attending: Nurse Practitioner | Admitting: Nurse Practitioner

## 2017-07-28 DIAGNOSIS — Z171 Estrogen receptor negative status [ER-]: Principal | ICD-10-CM

## 2017-07-28 DIAGNOSIS — C50212 Malignant neoplasm of upper-inner quadrant of left female breast: Secondary | ICD-10-CM

## 2017-07-28 MED ORDER — GADOBENATE DIMEGLUMINE 529 MG/ML IV SOLN
17.0000 mL | Freq: Once | INTRAVENOUS | Status: AC | PRN
  Administered 2017-07-28: 17 mL via INTRAVENOUS

## 2017-07-29 ENCOUNTER — Other Ambulatory Visit: Payer: Self-pay | Admitting: Nurse Practitioner

## 2017-07-29 DIAGNOSIS — Z171 Estrogen receptor negative status [ER-]: Principal | ICD-10-CM

## 2017-07-29 DIAGNOSIS — Z853 Personal history of malignant neoplasm of breast: Secondary | ICD-10-CM | POA: Diagnosis not present

## 2017-07-29 DIAGNOSIS — C50212 Malignant neoplasm of upper-inner quadrant of left female breast: Secondary | ICD-10-CM

## 2017-07-29 DIAGNOSIS — Z7984 Long term (current) use of oral hypoglycemic drugs: Secondary | ICD-10-CM | POA: Diagnosis not present

## 2017-07-29 DIAGNOSIS — E119 Type 2 diabetes mellitus without complications: Secondary | ICD-10-CM | POA: Diagnosis not present

## 2017-07-29 DIAGNOSIS — Z923 Personal history of irradiation: Secondary | ICD-10-CM | POA: Diagnosis not present

## 2017-07-29 DIAGNOSIS — N651 Disproportion of reconstructed breast: Secondary | ICD-10-CM | POA: Diagnosis not present

## 2017-08-04 ENCOUNTER — Ambulatory Visit
Admission: RE | Admit: 2017-08-04 | Discharge: 2017-08-04 | Disposition: A | Discharge status: Home / Self Care | Payer: Medicare Other | Source: Ambulatory Visit | Attending: Nurse Practitioner | Admitting: Nurse Practitioner

## 2017-08-04 ENCOUNTER — Ambulatory Visit
Admission: RE | Admit: 2017-08-04 | Discharge: 2017-08-04 | Disposition: A | Discharge status: Home / Self Care | Payer: Medicare Other | Source: Ambulatory Visit

## 2017-08-04 DIAGNOSIS — C50212 Malignant neoplasm of upper-inner quadrant of left female breast: Secondary | ICD-10-CM

## 2017-08-04 DIAGNOSIS — Z171 Estrogen receptor negative status [ER-]: Principal | ICD-10-CM

## 2017-08-04 MED ORDER — GADOBENATE DIMEGLUMINE 529 MG/ML IV SOLN
17.0000 mL | Freq: Once | INTRAVENOUS | Status: AC | PRN
  Administered 2017-08-04: 17 mL via INTRAVENOUS

## 2017-08-10 ENCOUNTER — Other Ambulatory Visit: Payer: Self-pay | Admitting: Internal Medicine

## 2017-08-30 ENCOUNTER — Encounter (HOSPITAL_BASED_OUTPATIENT_CLINIC_OR_DEPARTMENT_OTHER): Payer: Self-pay | Admitting: *Deleted

## 2017-09-01 ENCOUNTER — Encounter (HOSPITAL_BASED_OUTPATIENT_CLINIC_OR_DEPARTMENT_OTHER)
Admission: RE | Admit: 2017-09-01 | Discharge: 2017-09-01 | Disposition: A | Discharge status: Home / Self Care | Payer: BLUE CROSS/BLUE SHIELD | Source: Ambulatory Visit | Attending: Plastic Surgery | Admitting: Plastic Surgery

## 2017-09-01 DIAGNOSIS — Z01812 Encounter for preprocedural laboratory examination: Secondary | ICD-10-CM | POA: Diagnosis not present

## 2017-09-01 DIAGNOSIS — Z9889 Other specified postprocedural states: Secondary | ICD-10-CM | POA: Insufficient documentation

## 2017-09-01 DIAGNOSIS — Z853 Personal history of malignant neoplasm of breast: Secondary | ICD-10-CM | POA: Insufficient documentation

## 2017-09-01 DIAGNOSIS — N6489 Other specified disorders of breast: Secondary | ICD-10-CM | POA: Diagnosis not present

## 2017-09-01 DIAGNOSIS — Z923 Personal history of irradiation: Secondary | ICD-10-CM | POA: Insufficient documentation

## 2017-09-01 LAB — BASIC METABOLIC PANEL
ANION GAP: 6 (ref 5–15)
BUN: 11 mg/dL (ref 6–20)
CHLORIDE: 104 mmol/L (ref 101–111)
CO2: 30 mmol/L (ref 22–32)
CREATININE: 0.77 mg/dL (ref 0.44–1.00)
Calcium: 9.9 mg/dL (ref 8.9–10.3)
GFR calc non Af Amer: >60 mL/min (ref >60)
Glucose, Bld: 104 mg/dL — ABNORMAL HIGH (ref 65–99)
POTASSIUM: 4.9 mmol/L (ref 3.5–5.1)
SODIUM: 140 mmol/L (ref 135–145)

## 2017-09-01 NOTE — Progress Notes (Signed)
Ensure pre surgery drink given with instructions to complete by [place], hibiclens soap given with instructions ( instructions were written for pt) pt verbalized understanding.

## 2017-09-07 ENCOUNTER — Ambulatory Visit (HOSPITAL_BASED_OUTPATIENT_CLINIC_OR_DEPARTMENT_OTHER): Payer: BLUE CROSS/BLUE SHIELD | Admitting: Certified Registered Nurse Anesthetist

## 2017-09-07 ENCOUNTER — Ambulatory Visit (HOSPITAL_BASED_OUTPATIENT_CLINIC_OR_DEPARTMENT_OTHER)
Admission: RE | Admit: 2017-09-07 | Discharge: 2017-09-07 | Disposition: A | Discharge status: Home / Self Care | Payer: BLUE CROSS/BLUE SHIELD | Source: Ambulatory Visit | Attending: Plastic Surgery | Admitting: Plastic Surgery

## 2017-09-07 ENCOUNTER — Encounter (HOSPITAL_BASED_OUTPATIENT_CLINIC_OR_DEPARTMENT_OTHER): Payer: Self-pay | Admitting: Certified Registered Nurse Anesthetist

## 2017-09-07 ENCOUNTER — Encounter (HOSPITAL_BASED_OUTPATIENT_CLINIC_OR_DEPARTMENT_OTHER)
Admission: RE | Disposition: A | Discharge status: Home / Self Care | Payer: Self-pay | Source: Ambulatory Visit | Attending: Plastic Surgery

## 2017-09-07 DIAGNOSIS — N6489 Other specified disorders of breast: Secondary | ICD-10-CM | POA: Diagnosis not present

## 2017-09-07 DIAGNOSIS — E119 Type 2 diabetes mellitus without complications: Secondary | ICD-10-CM | POA: Diagnosis not present

## 2017-09-07 DIAGNOSIS — Z9221 Personal history of antineoplastic chemotherapy: Secondary | ICD-10-CM | POA: Insufficient documentation

## 2017-09-07 DIAGNOSIS — Z853 Personal history of malignant neoplasm of breast: Secondary | ICD-10-CM | POA: Insufficient documentation

## 2017-09-07 DIAGNOSIS — Z7984 Long term (current) use of oral hypoglycemic drugs: Secondary | ICD-10-CM | POA: Insufficient documentation

## 2017-09-07 DIAGNOSIS — Z923 Personal history of irradiation: Secondary | ICD-10-CM | POA: Diagnosis not present

## 2017-09-07 DIAGNOSIS — Z87891 Personal history of nicotine dependence: Secondary | ICD-10-CM | POA: Diagnosis not present

## 2017-09-07 DIAGNOSIS — N651 Disproportion of reconstructed breast: Secondary | ICD-10-CM | POA: Diagnosis not present

## 2017-09-07 HISTORY — PX: MASTOPEXY: SHX5358

## 2017-09-07 LAB — GLUCOSE, CAPILLARY
GLUCOSE-CAPILLARY: 189 mg/dL — AB (ref 65–99)
Glucose-Capillary: 133 mg/dL — ABNORMAL HIGH (ref 65–99)

## 2017-09-07 SURGERY — MASTOPEXY
Anesthesia: General | Site: Breast | Laterality: Left

## 2017-09-07 MED ORDER — ACETAMINOPHEN 500 MG PO TABS
ORAL_TABLET | ORAL | Status: AC
  Filled 2017-09-07: qty 2

## 2017-09-07 MED ORDER — FENTANYL CITRATE (PF) 100 MCG/2ML IJ SOLN
INTRAMUSCULAR | Status: AC
  Filled 2017-09-07: qty 2

## 2017-09-07 MED ORDER — HYDROMORPHONE HCL 1 MG/ML IJ SOLN
INTRAMUSCULAR | Status: AC
  Filled 2017-09-07: qty 0.5

## 2017-09-07 MED ORDER — CELECOXIB 200 MG PO CAPS
200.0000 mg | ORAL_CAPSULE | ORAL | Status: AC
  Administered 2017-09-07: 200 mg via ORAL

## 2017-09-07 MED ORDER — HYDROCODONE-ACETAMINOPHEN 5-325 MG PO TABS: 1.0000 | ORAL_TABLET | ORAL | 0 refills | Status: DC | PRN

## 2017-09-07 MED ORDER — MIDAZOLAM HCL 2 MG/2ML IJ SOLN
INTRAMUSCULAR | Status: AC
  Filled 2017-09-07: qty 2

## 2017-09-07 MED ORDER — 0.9 % SODIUM CHLORIDE (POUR BTL) OPTIME
TOPICAL | Status: DC | PRN
  Administered 2017-09-07: 1000 mL

## 2017-09-07 MED ORDER — CHLORHEXIDINE GLUCONATE CLOTH 2 % EX PADS: 6.0000 | MEDICATED_PAD | Freq: Once | CUTANEOUS | Status: DC

## 2017-09-07 MED ORDER — ACETAMINOPHEN 500 MG PO TABS
1000.0000 mg | ORAL_TABLET | ORAL | Status: AC
  Administered 2017-09-07: 1000 mg via ORAL

## 2017-09-07 MED ORDER — ROCURONIUM BROMIDE 10 MG/ML (PF) SYRINGE
PREFILLED_SYRINGE | INTRAVENOUS | Status: AC
  Filled 2017-09-07: qty 5

## 2017-09-07 MED ORDER — CEFAZOLIN SODIUM-DEXTROSE 2-4 GM/100ML-% IV SOLN
2.0000 g | INTRAVENOUS | Status: AC
  Administered 2017-09-07: 2 g via INTRAVENOUS

## 2017-09-07 MED ORDER — ROCURONIUM BROMIDE 50 MG/5ML IV SOSY
PREFILLED_SYRINGE | INTRAVENOUS | Status: DC | PRN
  Administered 2017-09-07: 30 mg via INTRAVENOUS

## 2017-09-07 MED ORDER — FENTANYL CITRATE (PF) 100 MCG/2ML IJ SOLN
INTRAMUSCULAR | Status: DC | PRN
  Administered 2017-09-07: 100 ug via INTRAVENOUS

## 2017-09-07 MED ORDER — CELECOXIB 200 MG PO CAPS
ORAL_CAPSULE | ORAL | Status: AC
  Filled 2017-09-07: qty 1

## 2017-09-07 MED ORDER — HYDROMORPHONE HCL 1 MG/ML IJ SOLN
0.2500 mg | INTRAMUSCULAR | Status: DC | PRN
  Administered 2017-09-07: 0.5 mg via INTRAVENOUS

## 2017-09-07 MED ORDER — DEXAMETHASONE SODIUM PHOSPHATE 10 MG/ML IJ SOLN
INTRAMUSCULAR | Status: AC
  Filled 2017-09-07: qty 1

## 2017-09-07 MED ORDER — ONDANSETRON HCL 4 MG/2ML IJ SOLN
INTRAMUSCULAR | Status: DC | PRN
Start: 2017-09-07 — End: 2017-09-07
  Administered 2017-09-07: 4 mg via INTRAVENOUS

## 2017-09-07 MED ORDER — BUPIVACAINE HCL (PF) 0.5 % IJ SOLN
INTRAMUSCULAR | Status: AC
  Filled 2017-09-07: qty 30

## 2017-09-07 MED ORDER — DEXAMETHASONE SODIUM PHOSPHATE 10 MG/ML IJ SOLN
INTRAMUSCULAR | Status: DC | PRN
  Administered 2017-09-07: 5 mg via INTRAVENOUS

## 2017-09-07 MED ORDER — EPHEDRINE SULFATE 50 MG/ML IJ SOLN
INTRAMUSCULAR | Status: DC | PRN
  Administered 2017-09-07 (×3): 10 mg via INTRAVENOUS

## 2017-09-07 MED ORDER — LIDOCAINE 2% (20 MG/ML) 5 ML SYRINGE
INTRAMUSCULAR | Status: AC
  Filled 2017-09-07: qty 5

## 2017-09-07 MED ORDER — PROPOFOL 10 MG/ML IV BOLUS
INTRAVENOUS | Status: DC | PRN
  Administered 2017-09-07: 140 mg via INTRAVENOUS

## 2017-09-07 MED ORDER — GABAPENTIN 300 MG PO CAPS
300.0000 mg | ORAL_CAPSULE | ORAL | Status: AC
  Administered 2017-09-07: 300 mg via ORAL

## 2017-09-07 MED ORDER — MIDAZOLAM HCL 2 MG/2ML IJ SOLN: 1.0000 mg | INTRAMUSCULAR | Status: DC | PRN

## 2017-09-07 MED ORDER — OXYCODONE HCL 5 MG/5ML PO SOLN: 5.0000 mg | Freq: Once | ORAL | Status: DC | PRN

## 2017-09-07 MED ORDER — ALBUTEROL SULFATE (2.5 MG/3ML) 0.083% IN NEBU
INHALATION_SOLUTION | RESPIRATORY_TRACT | Status: AC
  Filled 2017-09-07: qty 3

## 2017-09-07 MED ORDER — SCOPOLAMINE 1 MG/3DAYS TD PT72: 1.0000 | MEDICATED_PATCH | Freq: Once | TRANSDERMAL | Status: DC | PRN

## 2017-09-07 MED ORDER — BUPIVACAINE HCL (PF) 0.5 % IJ SOLN
INTRAMUSCULAR | Status: DC | PRN
  Administered 2017-09-07: 20 mL

## 2017-09-07 MED ORDER — ALBUTEROL SULFATE (2.5 MG/3ML) 0.083% IN NEBU
2.5000 mg | INHALATION_SOLUTION | Freq: Once | RESPIRATORY_TRACT | Status: AC
  Administered 2017-09-07: 2.5 mg via RESPIRATORY_TRACT

## 2017-09-07 MED ORDER — LACTATED RINGERS IV SOLN
INTRAVENOUS | Status: DC
  Administered 2017-09-07: 10 mL/h via INTRAVENOUS
  Administered 2017-09-07: 12:00:00 via INTRAVENOUS

## 2017-09-07 MED ORDER — PROPOFOL 10 MG/ML IV BOLUS
INTRAVENOUS | Status: AC
  Filled 2017-09-07: qty 20

## 2017-09-07 MED ORDER — FENTANYL CITRATE (PF) 100 MCG/2ML IJ SOLN: 50.0000 ug | INTRAMUSCULAR | Status: DC | PRN

## 2017-09-07 MED ORDER — GABAPENTIN 300 MG PO CAPS
ORAL_CAPSULE | ORAL | Status: AC
  Filled 2017-09-07: qty 1

## 2017-09-07 MED ORDER — CEFAZOLIN SODIUM-DEXTROSE 2-4 GM/100ML-% IV SOLN
INTRAVENOUS | Status: AC
  Filled 2017-09-07: qty 100

## 2017-09-07 MED ORDER — LIDOCAINE 2% (20 MG/ML) 5 ML SYRINGE
INTRAMUSCULAR | Status: DC | PRN
  Administered 2017-09-07: 60 mg via INTRAVENOUS

## 2017-09-07 MED ORDER — SUGAMMADEX SODIUM 200 MG/2ML IV SOLN
INTRAVENOUS | Status: AC
  Filled 2017-09-07: qty 2

## 2017-09-07 MED ORDER — ONDANSETRON HCL 4 MG/2ML IJ SOLN
INTRAMUSCULAR | Status: AC
  Filled 2017-09-07: qty 2

## 2017-09-07 MED ORDER — OXYCODONE HCL 5 MG PO TABS: 5.0000 mg | ORAL_TABLET | Freq: Once | ORAL | Status: DC | PRN

## 2017-09-07 MED ORDER — MIDAZOLAM HCL 2 MG/2ML IJ SOLN
INTRAMUSCULAR | Status: DC | PRN
  Administered 2017-09-07: 2 mg via INTRAVENOUS

## 2017-09-07 MED ORDER — SUGAMMADEX SODIUM 200 MG/2ML IV SOLN
INTRAVENOUS | Status: DC | PRN
  Administered 2017-09-07: 200 mg via INTRAVENOUS

## 2017-09-07 MED ORDER — PROMETHAZINE HCL 25 MG/ML IJ SOLN: 6.2500 mg | INTRAMUSCULAR | Status: DC | PRN

## 2017-09-07 MED ORDER — EPHEDRINE 5 MG/ML INJ
INTRAVENOUS | Status: AC
  Filled 2017-09-07: qty 10

## 2017-09-07 SURGICAL SUPPLY — 65 items
ADH SKN CLS APL DERMABOND .7 (GAUZE/BANDAGES/DRESSINGS) ×1
BAG DECANTER FOR FLEXI CONT (MISCELLANEOUS) ×3 IMPLANT
BINDER BREAST LRG (GAUZE/BANDAGES/DRESSINGS) IMPLANT
BINDER BREAST MEDIUM (GAUZE/BANDAGES/DRESSINGS) IMPLANT
BINDER BREAST XLRG (GAUZE/BANDAGES/DRESSINGS) ×2 IMPLANT
BINDER BREAST XXLRG (GAUZE/BANDAGES/DRESSINGS) IMPLANT
BLADE SURG 10 STRL SS (BLADE) ×5 IMPLANT
BLADE SURG 15 STRL LF DISP TIS (BLADE) IMPLANT
BLADE SURG 15 STRL SS (BLADE)
BNDG GAUZE ELAST 4 BULKY (GAUZE/BANDAGES/DRESSINGS) ×6 IMPLANT
CANISTER SUCT 1200ML W/VALVE (MISCELLANEOUS) ×3 IMPLANT
CHLORAPREP W/TINT 26ML (MISCELLANEOUS) ×5 IMPLANT
CLOSURE WOUND 1/2 X4 (GAUZE/BANDAGES/DRESSINGS)
COVER MAYO STAND STRL (DRAPES) ×3 IMPLANT
DECANTER SPIKE VIAL GLASS SM (MISCELLANEOUS) IMPLANT
DERMABOND ADVANCED (GAUZE/BANDAGES/DRESSINGS) ×2
DERMABOND ADVANCED .7 DNX12 (GAUZE/BANDAGES/DRESSINGS) ×1 IMPLANT
DRAIN CHANNEL 19F RND (DRAIN) IMPLANT
DRAPE TOP ARMCOVERS (MISCELLANEOUS) ×3 IMPLANT
DRAPE U-SHAPE 76X120 STRL (DRAPES) ×3 IMPLANT
DRAPE UTILITY XL STRL (DRAPES) IMPLANT
DRSG PAD ABDOMINAL 8X10 ST (GAUZE/BANDAGES/DRESSINGS) ×6 IMPLANT
DRSG TEGADERM 2-3/8X2-3/4 SM (GAUZE/BANDAGES/DRESSINGS) IMPLANT
ELECT BLADE 4.0 EZ CLEAN MEGAD (MISCELLANEOUS) ×3
ELECT COATED BLADE 2.86 ST (ELECTRODE) ×3 IMPLANT
ELECT REM PT RETURN 9FT ADLT (ELECTROSURGICAL) ×3
ELECTRODE BLDE 4.0 EZ CLN MEGD (MISCELLANEOUS) IMPLANT
ELECTRODE REM PT RTRN 9FT ADLT (ELECTROSURGICAL) ×1 IMPLANT
EVACUATOR SILICONE 100CC (DRAIN) ×2 IMPLANT
GAUZE SPONGE 4X4 12PLY STRL (GAUZE/BANDAGES/DRESSINGS) IMPLANT
GAUZE SPONGE 4X4 12PLY STRL LF (GAUZE/BANDAGES/DRESSINGS) IMPLANT
GLOVE BIO SURGEON STRL SZ 6 (GLOVE) ×3 IMPLANT
GLOVE BIOGEL PI IND STRL 7.0 (GLOVE) IMPLANT
GLOVE BIOGEL PI IND STRL 7.5 (GLOVE) IMPLANT
GLOVE BIOGEL PI INDICATOR 7.0 (GLOVE) ×2
GLOVE BIOGEL PI INDICATOR 7.5 (GLOVE) ×2
GLOVE SURG SS PI 6.5 STRL IVOR (GLOVE) ×2 IMPLANT
GOWN STRL REUS W/ TWL LRG LVL3 (GOWN DISPOSABLE) ×2 IMPLANT
GOWN STRL REUS W/TWL LRG LVL3 (GOWN DISPOSABLE) ×6
MARKER SKIN DUAL TIP RULER LAB (MISCELLANEOUS) IMPLANT
NDL HYPO 25X1 1.5 SAFETY (NEEDLE) IMPLANT
NEEDLE HYPO 25X1 1.5 SAFETY (NEEDLE) ×3 IMPLANT
NS IRRIG 1000ML POUR BTL (IV SOLUTION) ×3 IMPLANT
PACK BASIN DAY SURGERY FS (CUSTOM PROCEDURE TRAY) ×3 IMPLANT
PENCIL BUTTON HOLSTER BLD 10FT (ELECTRODE) ×3 IMPLANT
PIN SAFETY STERILE (MISCELLANEOUS) IMPLANT
SHEET MEDIUM DRAPE 40X70 STRL (DRAPES) IMPLANT
SLEEVE SCD COMPRESS KNEE MED (MISCELLANEOUS) ×3 IMPLANT
SPONGE LAP 18X18 X RAY DECT (DISPOSABLE) ×5 IMPLANT
STAPLER VISISTAT 35W (STAPLE) ×5 IMPLANT
STRIP CLOSURE SKIN 1/2X4 (GAUZE/BANDAGES/DRESSINGS) IMPLANT
SUT MNCRL AB 4-0 PS2 18 (SUTURE) ×5 IMPLANT
SUT VIC AB 3-0 PS1 18 (SUTURE) ×9
SUT VIC AB 3-0 PS1 18XBRD (SUTURE) ×1 IMPLANT
SUT VIC AB 3-0 SH 27 (SUTURE)
SUT VIC AB 3-0 SH 27X BRD (SUTURE) IMPLANT
SUT VICRYL 4-0 PS2 18IN ABS (SUTURE) ×3 IMPLANT
SYR BULB IRRIGATION 50ML (SYRINGE) ×3 IMPLANT
SYR CONTROL 10ML LL (SYRINGE) ×2 IMPLANT
TAPE MEASURE VINYL STERILE (MISCELLANEOUS) ×1 IMPLANT
TOWEL OR 17X24 6PK STRL BLUE (TOWEL DISPOSABLE) ×6 IMPLANT
TUBE CONNECTING 20'X1/4 (TUBING) ×1
TUBE CONNECTING 20X1/4 (TUBING) ×2 IMPLANT
UNDERPAD 30X30 (UNDERPADS AND DIAPERS) ×2 IMPLANT
YANKAUER SUCT BULB TIP NO VENT (SUCTIONS) ×3 IMPLANT

## 2017-09-07 NOTE — Anesthesia Postprocedure Evaluation (Signed)
Anesthesia Post Note  Patient: Regina Watts  Procedure(s) Performed: LEFT BREAST MASTOPEXY (Left Breast)     Patient location during evaluation: PACU Anesthesia Type: General Level of consciousness: awake and alert Pain management: pain level controlled Vital Signs Assessment: post-procedure vital signs reviewed and stable Respiratory status: spontaneous breathing, nonlabored ventilation, respiratory function stable and patient connected to nasal cannula oxygen Cardiovascular status: blood pressure returned to baseline and stable Postop Assessment: no apparent nausea or vomiting Anesthetic complications: no    Last Vitals:  Vitals:   09/07/17 1415 09/07/17 1430  BP:    Pulse: 98 86  Resp:    Temp:    SpO2: 92% 99%    Last Pain:  Vitals:   09/07/17 1406  TempSrc: Oral  PainSc: 2                  Ryan P Ellender

## 2017-09-07 NOTE — Discharge Instructions (Signed)

## 2017-09-07 NOTE — Transfer of Care (Signed)
Immediate Anesthesia Transfer of Care Note  Patient: Regina Watts  Procedure(s) Performed: LEFT BREAST MASTOPEXY (Left Breast)  Patient Location: PACU  Anesthesia Type:General  Level of Consciousness: awake, alert  and oriented  Airway & Oxygen Therapy: Patient Spontanous Breathing and Patient connected to face mask oxygen  Post-op Assessment: Report given to RN and Post -op Vital signs reviewed and stable  Post vital signs: Reviewed and stable  Last Vitals:  Vitals:   09/07/17 0918  BP: 119/73  Pulse: 67  Resp: 18  Temp: 36.6 C  SpO2: 97%    Last Pain:  Vitals:   09/07/17 0918  TempSrc: Oral         Complications: No apparent anesthesia complications

## 2017-09-07 NOTE — Anesthesia Preprocedure Evaluation (Addendum)
Anesthesia Evaluation  Patient identified by MRN, date of birth, ID band Patient awake    Reviewed: Allergy & Precautions, H&P , NPO status , Patient's Chart, lab work & pertinent test results  Airway Mallampati: III  TM Distance: >3 FB Neck ROM: full    Dental no notable dental hx.    Pulmonary former smoker,    Pulmonary exam normal breath sounds clear to auscultation       Cardiovascular negative cardio ROS Normal cardiovascular exam Rhythm:Regular Rate:Normal     Neuro/Psych PSYCHIATRIC DISORDERS Congenital deafness    GI/Hepatic negative GI ROS, Neg liver ROS,   Endo/Other  negative endocrine ROS  Renal/GU negative Renal ROS     Musculoskeletal negative musculoskeletal ROS (+)   Abdominal   Peds  Hematology negative hematology ROS (+)   Anesthesia Other Findings  HISTORY OF BILATERAL BREAST CANCER S/p chemo and radiation HLD  Reproductive/Obstetrics                            Anesthesia Physical  Anesthesia Plan  ASA: III  Anesthesia Plan: General   Post-op Pain Management:    Induction: Intravenous  PONV Risk Score and Plan: 3 and Ondansetron, Dexamethasone and Midazolam  Airway Management Planned: LMA  Additional Equipment:   Intra-op Plan:   Post-operative Plan: Extubation in OR  Informed Consent: I have reviewed the patients History and Physical, chart, labs and discussed the procedure including the risks, benefits and alternatives for the proposed anesthesia with the patient or authorized representative who has indicated his/her understanding and acceptance.   Dental advisory given  Plan Discussed with: CRNA  Anesthesia Plan Comments:         Anesthesia Quick Evaluation

## 2017-09-07 NOTE — H&P (Signed)
Subjective:     Patient ID: Regina Watts is a 56 y.o. female.  HPI  Patient of Drs. Arturo Morton referred for consultation breast reconstruction. She had a history of triple negative LEFT breast cancer, stage I A, treated with lumpectomy SLN 03-06-14. Completed adjuvant chemotherapy completed 09-10-2014 and RT completed 12-11-14. History RIGHT breast T2N0M0  cancer 11-2000 at age 43 treated with lumpectomy, SLN, chemotherapy and XRT. Here for discussion asymmetry breasts. Notes left larger. She states she would be satisfied with right breast volume.  3 VUS on Breast Ovarian panel 01-2014 in [facility], [phone]; ATM, c.8674G>A and PMS2.  Last MMG 01/15/2017.  Prior to initial lumpectomy 38 C, current 36 C. Not using insert  Patient with congenital deafness.   Has DM on metformin, no recent HbA1c.  Lives with 41 yo daughter, husband is Clinical biochemist with [employer] and lives primarily in [place] but has come and stayed for prior surgeries.      Objective:   Physical Exam  Constitutional: She is oriented to person, place, and time.  Cardiovascular: Normal rate, regular rhythm and normal heart sounds.   Pulmonary/Chest: Effort normal and breath sounds normal.  Abdominal: Soft.  Lymphadenopathy:    She has no axillary adenopathy.  Neurological: She is alert and oriented to person, place, and time.  Skin:  Fitzpatrick 2   No masses, bilateral transverse upper pole scars, right chest port scar Right breast pseudoptosis, left grade 1 ptosis Left> right volume SN to nipple R 23 L 28 cm BW R 17 L 20 cm Nipple to IMF R 9.5 L 12 cm    Assessment:     History bilateral breast ca  S/p bilateral lumpectomies, SLN Adjuvant radiation bilateral Asymmetry post surgical breasts    Plan:     Discussed with patient risks surgery including wound healing problems greater post radiation. If she is satisfied with right breast volume, offered left breast mastopexy for symmetry.  Reviewed  anchor type scars, possible drain, OP surgery, post operative visits and limitations, recovery. Diminished sensation nipple and breast skin, risk of nipple loss, wound healing problems, asymmetry, incidental carcinoma, changes with wt gain/loss, aging, unacceptable cosmetic appearance reviewed.  Irene Limbo, MD [facility] Plastic & Reconstructive Surgery [phone], pin [phone]

## 2017-09-07 NOTE — Op Note (Signed)
Operative Note   DATE OF OPERATION: 10.30.18  LOCATION: [facility]-outpatient  SURGICAL DIVISION: Plastic Surgery  PREOPERATIVE DIAGNOSES:  1. History bilateral breast ca 2. History therapeutic radiation 3. Post operative asymmetry breasts  POSTOPERATIVE DIAGNOSES:  same  PROCEDURE:  Left breast mastopexy  SURGEON: Irene Limbo MD MBA  ASSISTANT: none  ANESTHESIA:  General.   EBL: 20 ml  COMPLICATIONS: None immediate.   INDICATIONS FOR PROCEDURE:  The patient, Regina Watts, is a 56 y.o. female born on [birth year], is here for left breast mastopexy for symmetry. Patient has history bilateral breast cancer post partial mastectomy and adjuvant radiation over both sides, presenting with asymmetry breasts.   FINDINGS: Left mastopexy 233 g  DESCRIPTION OF PROCEDURE:  The patient was marked standing in the preoperative area to mark sternal notch, chest midline, anterior axillary lines, inframammary folds. The breast meridian and location of new nipple areolar complex was marked over left breast symmetric with right breast. With aid of Wise pattern marker, location of new nipple areolar complex and vertical limbs (8.5cm) were marked. The patient was taken to the operating room. SCDs were placed and IV antibiotics were given. The patient's operative site was prepped and draped in a sterile fashion. A time out was performed and all information was confirmed to be correct.   Over left breast, superomedial pedicle marked and nipple areolar complex marked with 38 mm diameter marker. Pedicle deepithlialized and developedto chest wall. Breast tissue excised from inferior pole and lateral breast.Medial and lateral flaps developed. Breast tailor tacked closed, and patient brought to upright sitting position and assessed for symmetry. Patent returned to supine position and additional left breast tissue excised. She was again assessed in sitting position. She was returned to supine  position and breast cavities irrigated, hemostasis obtained. Marcaine infiltrated throughout breast. 15 Fr JP placed and secured with 2-0 nylon. Closure completed with 3-0 vicryl to approximate dermis along inframammary fold and vertical limb. NAC inset with 4-0 vicryl in dermis. Skin closure completed with 4-0 monocryl subcuticular throughout.Tissue adhesive applied. Dry dressing and breast binder applied.  The patient was allowed to wake from anesthesia, extubated and taken to the recovery room in satisfactory condition.   SPECIMENS: left breast tissue  DRAINS: 15 Fr JP in left breast  Irene Limbo, MD [facility] [phone], pin [phone]

## 2017-09-07 NOTE — Anesthesia Procedure Notes (Signed)
Procedure Name: Intubation Date/Time: 09/07/2017 10:20 AM Performed by: Genelle Bal Pre-anesthesia Checklist: Patient identified, Emergency Drugs available, Suction available and Patient being monitored Patient Re-evaluated:Patient Re-evaluated prior to induction Oxygen Delivery Method: Circle system utilized Preoxygenation: Pre-oxygenation with 100% oxygen Induction Type: IV induction Ventilation: Mask ventilation without difficulty Laryngoscope Size: Glidescope and 3 Grade View: Grade I Tube type: Oral Tube size: 7.0 mm Number of attempts: 1 Airway Equipment and Method: Stylet and Oral airway Placement Confirmation: ETT inserted through vocal cords under direct vision,  positive ETCO2 and breath sounds checked- equal and bilateral Secured at: 22 cm Tube secured with: Tape Dental Injury: Teeth and Oropharynx as per pre-operative assessment  Difficulty Due To: Difficult Airway- due to anterior larynx

## 2017-09-08 ENCOUNTER — Encounter (HOSPITAL_BASED_OUTPATIENT_CLINIC_OR_DEPARTMENT_OTHER): Payer: Self-pay | Admitting: Plastic Surgery

## 2017-09-29 DIAGNOSIS — L821 Other seborrheic keratosis: Secondary | ICD-10-CM | POA: Diagnosis not present

## 2017-09-29 DIAGNOSIS — L820 Inflamed seborrheic keratosis: Secondary | ICD-10-CM | POA: Diagnosis not present

## 2017-12-15 ENCOUNTER — Other Ambulatory Visit: Payer: Self-pay | Admitting: Obstetrics and Gynecology

## 2017-12-15 DIAGNOSIS — Z9889 Other specified postprocedural states: Secondary | ICD-10-CM

## 2018-01-05 NOTE — Progress Notes (Signed)
Regina Watts  [phone] [phone]  Clinic Follow Up Note   Patient Care Team: Wenda Low, MD as PCP - General (Internal Medicine) Gordy Levan, MD as Consulting Physician (Oncology) Tyler Pita, MD as Consulting Physician (Radiation Oncology) Alphonsa Overall, MD as Consulting Physician (General Surgery)   Date of Service:  01/06/2018  CHIEF COMPLAINTS:  F/u Triple negative left breast cancer    Breast cancer of upper-inner quadrant of left female breast ([place])   12/2013 Initial Diagnosis    Triple negative malignant neoplasm of breast ([place])      01/01/2014 Pathology Results    Breast, left, needle core biopsy, mass, 10 o'clock - INVASIVE DUCTAL CARCINOMA.      01/30/2014 Genetic Testing    PMS2 c.1309C>T, ATM c.8674G>A and ATM [phone] VUS's found on the Breast/Ovarian cancer panel.  The ATM [phone] VUS was identified as a mosaic result. The Breast/Ovarian gene panel offered by [facility] includes sequencing and rearrangement analysis for the following 21 genes:  ATM, BARD1, BRCA1, BRCA2, BRIP1, CDH1, CHEK2, EPCAM, FANCC, MLH1, MSH2, MSH6, NBN, PALB2, PMS2, PTEN, RAD51C, RAD51D, STK11, TP53, and XRCC2.     UPDATE: PMS2 c.1309C>T VUS has been reclassified from a variant of uncertain significance to a likely benign variant based on a combination of sources, e.g., internal data, published literature, population databases and in [place].  The updated report date is Apr 08, 2017.       03/06/2014 Surgery    S/p Lumpectomy by Dr. Lucia Gaskins at [facility].       03/06/2014 Pathology Results    1. Lymph node, sentinel, biopsy, Left axillary - ONE BENIGN LYMPH NODE WITH NO TUMOR SEEN (0/1). 2. Breast, lumpectomy, Left - INVASIVE GRADE II DUCTAL CARCINOMA SPANNING 1.8 CM IN GREATEST DIMENSION. - INVASIVE TUMOR IS EXTREMELY CLOSE (0.1 CM) TO POSTERIOR MARGIN ON INITIAL LUMPECTOMY SPECIMEN, PLEASE SEE SPECIMEN # 3 FOR FINAL  MARGIN STATUS. - OTHER MARGINS ARE NEGATIVE. - SEE ONCOLOGY TEMPLATE. 3. Breast, excision, Left posterior margin - BENIGN BREAST PARENCHYMA. - NO TUMOR SEEN.      03/06/2014 Receptors her2    Estrogen receptor: negative. Progesterone receptor: negative. HER-2: negative      03/30/2014 - 04/14/2014 Chemotherapy    The patient had adjuvant chemotherapy treatment, Cytoxan and Adriamycin for 4 cycles then Taxol for 12 weeks       10/22/2014 - 12/11/2014 Radiation Therapy     The patient saw Dr. Tammi Klippel for radiation treatment. This is the current list of radiation treatment:  1.  The whole left breast was treated to 50.4 Gy in 28 fractions of 1.8 Gy 2.  The lumpectomy surgical site was treated to a total dose of 60.4 Gy with 5 additional fractions of 2 Gy      09/07/2017 Surgery    LEFT BREAST MASTOPEXY by Dr. Iran Planas      HISTORY OF PRESENTING ILLNESS (From Dr. Mariana Kaufman note on 09/21/2016):  Patient is seen, together with sign language interpreter, in follow up of slight new elevation in AST and ALT by labs in 08-2016, at 38 and 64 respectively. Fortunately, labs repeated for this visit are again in normal ranges. She has history of bilateral breast cancer, most recently triple negative in 12-2013; she has been on observation since completing adjuvant chemotherapy 09-2014. Most recent bilateral tomo mammograms were at [facility] 01-15-16.  Last breast MRI was 03-20-15. .  Gyn exam done by Dr Radene Knee spring 2017, yearly. She will have PE  with Dr Lysle Rubens in 10-2016 She has no regular follow up with general surgery  Patient continues to feel well, as she did at visit 08-31-16. She had viral URI after last visit, which resolved entirely. Energy and appetite are fine, no fever, no respiratory or GI symptoms. Remainder of full 10 point Review of Systems negative/ unchanged from my note 08-31-16.  PAC removed with site infection near completion of chemo.  Flu vaccine 08-31-16 3  variants of uncertain significance by Breast Ovarian panel 01-2014 (ATM, c.3874_[id number]; ATM, c.8674G>A and PMS2, C.1309C>T)  CURRENT THERAPY  Surveillance   INTERIM HISTORY:  Regina Watts presents for follow up of her triple negative left breast cancer. She was last seen by me 1 year ago. Since last visit she had a benign Breast MRI in 07/2017. She had a Left Breast Mastopexy by Dr. Iran Planas on 09/07/17.  She presents to the clinic today accompanied by her ASL interpreter. Pt notes she is doing well. She does not get out as much so she has been eating more at home. She notes she has not been exercising much. She is ready for it to be warmer so she can go outside more. She notes her breast feel more symmetrical since her breast reconstruction.    On review of symptoms, pt notes she is eating more and has gained weight. She notes tingling and numbness has improved in her feet but her fingers still have it present. She has some difficulty with dexterity and picking up small items. She use to take medication for this but stopped 3 year ago. She does not think anything has helped her. She is still taking Vitamin B.  She notes to occasionally getting bloated after certain meals.  She denies joint pian or discomfort. She notices her fingers are very sensitive to hot and cold water. She denies constipation. She denies pain in her left breast post surgery.    MEDICAL HISTORY:  Past Medical History:  Diagnosis Date  . Breast cancer ([place]) JANUARY 2002   RIGHT BREAST T4 SENTINEL NODE NEGATIVE, HER2 3+, ER/PR NEGATIVE  . Breast cancer [facility]) February 2015   Left Breast T1N0  . Congenital deafness   . Hx antineoplastic chemo   . Hx of radiation therapy   . Hyperlipidemia   . Wears glasses    to drive    SURGICAL HISTORY: Past Surgical History:  Procedure Laterality Date  . BREAST LUMPECTOMY Left 03/06/2014  . BREAST LUMPECTOMY Right 11/2000  . BREAST LUMPECTOMY WITH NEEDLE LOCALIZATION AND  AXILLARY SENTINEL LYMPH NODE BX Left 03/06/2014   Procedure: BREAST LUMPECTOMY WITH NEEDLE LOCALIZATION AND AXILLARY SENTINEL LYMPH NODE BIOPSY;  Surgeon: Shann Medal, MD;  Location: [place];  Service: General;  Laterality: Left;  . BREAST SURGERY  2002   lumpectomy on right side  . CESAREAN SECTION    . MASTOPEXY Left 09/07/2017   Procedure: LEFT BREAST MASTOPEXY;  Surgeon: Irene Limbo, MD;  Location: [place];  Service: Plastics;  Laterality: Left;  . PORT-A-CATH REMOVAL Right 08/03/2014   Procedure: REMOVAL PORT-A-CATH;  Surgeon: Jackolyn Confer, MD;  Location: [facility];  Service: General;  Laterality: Right;  . PORTACATH PLACEMENT Right 03/06/2014   Procedure: INSERTION PORT-A-CATH;  Surgeon: Shann Medal, MD;  Location: [place];  Service: General;  Laterality: Right;  subclavian  . TONSILLECTOMY      SOCIAL HISTORY: Social History   Socioeconomic History  . Marital status: Married    Spouse  name: Not on file  . Number of children: 1  . Years of education: Not on file  . Highest education level: Not on file  Social Needs  . Financial resource strain: Not on file  . Food insecurity - worry: Not on file  . Food insecurity - inability: Not on file  . Transportation needs - medical: Not on file  . Transportation needs - non-medical: Not on file  Occupational History  . Not on file  Tobacco Use  . Smoking status: Former Smoker    Years: 3.00    Last attempt to quit: 01/11/1994    Years since quitting: 24.0  . Smokeless tobacco: Never Used  Substance and Sexual Activity  . Alcohol use: Yes    Comment: very very seldom  . Drug use: No  . Sexual activity: Yes  Other Topics Concern  . Not on file  Social History Narrative  . Not on file    FAMILY HISTORY: Family History  Problem Relation Age of Onset  . Heart disease Mother   . Hypertension Mother   . Hyperlipidemia Mother   . Deafness Mother   . Liver cancer  Maternal Aunt   . Liver cancer Maternal Grandfather   . Deafness Father   . Prostate cancer Father 38  . Deafness Sister   . Deafness Paternal Uncle   . Breast cancer Cousin        maternal cousin dx over 48  . Deafness Maternal Aunt   . Leukemia Cousin 2    ALLERGIES:  is allergic to taxotere [docetaxel].  MEDICATIONS:  Current Outpatient Medications  Medication Sig Dispense Refill  . acetaminophen (TYLENOL) 325 MG tablet Take 650 mg by mouth as needed.    Marland Kitchen atorvastatin (LIPITOR) 40 MG tablet Take 40 mg by mouth daily.  3  . HYDROcodone-acetaminophen (NORCO) 5-325 MG tablet Take 1 tablet by mouth every 4 (four) hours as needed for moderate pain. 10 tablet 0  . metFORMIN (GLUCOPHAGE) 500 MG tablet TAKE 1 TABLET BY MOUTH EVERY DAY WITH MEALS  6  . pyridOXINE (VITAMIN B-6) 50 MG tablet Take 1 tablet (50 mg total) by mouth 2 (two) times daily. 60 tablet 5  . sertraline (ZOLOFT) 100 MG tablet Take 1 tablet (100 mg total) by mouth daily. 30 tablet 3  . Vitamin D, Ergocalciferol, (DRISDOL) 50000 units CAPS capsule Take 1 capsule (50,000 Units total) by mouth every 7 (seven) days. 8 capsule 0   No current facility-administered medications for this visit.     REVIEW OF SYSTEMS:   Constitutional: Denies fevers, chills  (+) Weight gain  Eyes: Denies blurriness of vision, double vision or watery eyes Ears, nose, mouth, throat, and face: Denies mucositis or sore throat Respiratory: Denies cough, dyspnea or wheezes Cardiovascular: Denies palpitation, chest discomfort or lower extremity swelling Gastrointestinal:  Denies nausea, heartburn or change in bowel habits (+) occasional bloating  Skin: Denies abnormal skin rashes Lymphatics: Denies new lymphadenopathy or easy bruising Neurological:Denies new weaknesses (+) neuropathy improved in feet but still present in hands Behavioral/Psych: Mood is stable, no new changes  All other systems were reviewed with the patient and are  negative.  PHYSICAL EXAMINATION: ECOG PERFORMANCE STATUS: 0 - Asymptomatic  Vitals:   01/06/18 1138  BP: 132/68  Pulse: 78  Resp: 20  Temp: 97.7 F (36.5 C)  SpO2: 98%   Filed Weights   01/06/18 1138  Weight: 178 lb 8 oz (81 kg)     GENERAL:alert, no distress and  comfortable SKIN: skin color, texture, turgor are normal, no rashes or significant lesions EYES: normal, conjunctiva are pink and non-injected, sclera clear OROPHARYNX:no exudate, no erythema and lips, buccal mucosa, and tongue normal  NECK: supple, thyroid normal size, non-tender, without nodularity LYMPH:  no palpable lymphadenopathy in the cervical, axillary or inguinal LUNGS: clear to auscultation and percussion with normal breathing effort HEART: regular rate & rhythm and no murmurs and no lower extremity edema ABDOMEN:abdomen soft, non-tender and normal bowel sounds Musculoskeletal:no cyanosis of digits and no clubbing  PSYCH: alert & oriented x 3 with fluent speech NEURO: no focal motor/sensory deficits Breasts: Breast inspection showed them to be symmetrical with no nipple discharge. (+) Surgical scar of b/l breasts all healed well with mild scar tissue. No palpable mass or adenopathy.    LABORATORY DATA:  I have reviewed the data as listed CBC Latest Ref Rng & Units 01/06/2018 07/07/2017 01/04/2017  WBC 3.9 - 10.3 K/uL 4.9 5.2 4.1  Hemoglobin 11.6 - 15.9 g/dL 14.4 14.2 14.4  Hematocrit 34.8 - 46.6 % 42.3 41.9 41.9  Platelets 145 - 400 K/uL 147 152 155    CMP Latest Ref Rng & Units 01/06/2018 09/01/2017 07/07/2017  Glucose 70 - 140 mg/dL 201(H) 104(H) 159(H)  BUN 7 - 26 mg/dL 13 11 15.8  Creatinine 0.60 - 1.10 mg/dL 0.88 0.77 0.9  Sodium 136 - 145 mmol/L 143 140 142  Potassium 3.5 - 5.1 mmol/L 4.4 4.9 3.8  Chloride 98 - 109 mmol/L 105 104 -  CO2 22 - 29 mmol/L _0 Calcium 8.4 - 10.4 mg/dL 9.7 9.9 10.1  Total Protein 6.4 - 8.3 g/dL 6.9 - 7.2  Total Bilirubin 0.2 - 1.2 mg/dL 0.7 - 0.86  Alkaline  Phos 40 - 150 U/L 118 - 102  AST 5 - 34 U/L 46(H) - 52(H)  ALT 0 - 55 U/L 82(H) - 107(H)   PATHOLOGY:  Diagnosis 03/06/2014 1. Lymph node, sentinel, biopsy, Left axillary - ONE BENIGN LYMPH NODE WITH NO TUMOR SEEN (0/1). 2. Breast, lumpectomy, Left - INVASIVE GRADE II DUCTAL CARCINOMA SPANNING 1.8 CM IN GREATEST DIMENSION. - INVASIVE TUMOR IS EXTREMELY CLOSE (0.1 CM) TO POSTERIOR MARGIN ON INITIAL LUMPECTOMY SPECIMEN, PLEASE SEE SPECIMEN # 3 FOR FINAL MARGIN STATUS. - OTHER MARGINS ARE NEGATIVE. - SEE ONCOLOGY TEMPLATE. 3. Breast, excision, Left posterior margin - BENIGN BREAST PARENCHYMA. - NO TUMOR SEEN.  Diagnosis 01/01/2014 Breast, left, needle core biopsy, mass, 10 o'clock - INVASIVE DUCTAL CARCINOMA. - SEE COMMENT. Microscopic Comment Although definitive grading of breast carcinoma is best done on excision, the features of the invasive tumor from the left needle core biopsy at 10 o'clock are compatible with a grade I-II breast carcinoma. Breast prognostic markers will be performed and reported in an addendum. Findings are called to the [facility] on 01/02/14. Dr. Donato Heinz has seen this case in consultation with agreement. (RAH:gt, 01/02/14)  RADIOGRAPHIC STUDIES: I have personally reviewed the radiological images as listed and agreed with the findings in the report.  MRI Breast B/l 08/04/17 IMPRESSION: No abnormal enhancement in either breast. RECOMMENDATION: Bilateral diagnostic mammogram in March of 2019 is recommended.   Breast MRI b/l 06/09/2016 IMPRESSION: Post lumpectomy changes bilaterally. No MRI evidence of malignancy. Scarring in the upper medial right breast.  Diagnostic Mammogram b/l 01/15/2016 IMPRESSION: 1. Stable bilateral lumpectomy sites. No evidence of disease recurrence.  2. No mammographic evidence of malignancy in the bilateral breasts.  ASSESSMENT & PLAN:  [name] is  a 57 y.o. female with:  1.Triple negative left breast  cancer T1cN0 diagnosed 12-2013, history of stage II right breast cancer HR-/HER2+ in 2002 -I previously reviewed her medical history extensively, and confirmed the findings with patient  -She had a history of triple negative breast cancer, stage I A, treated with lumpectomy and sentinel node evaluation 03-06-14. Adjuvant chemotherapy completed 09-10-2014; RT completed 12-11-14.  -history T2N0M0 right breast cancer 11-2000 at age 27 treated with lumpectomy, 2 sentinel nodes and chemotherapy -It is almost 4 years since her diagnosis.  Her risk of recurrence has significantly reduced at this point. I will see her for a complete 5 years of surveillance and then annually afterward.  -She is clinically doing well. Lab reviewed, her CBC within normal limits and her BG and LFTs are still elevated. Her physical exam and her 07/2017 B/l Breast MRI were unremarkable. There is no clinical concern for recurrence. -We'll continue annual screening mammogram, she is due 01/2018 -she will check her copay for her MRI, if high, will do every other year, next due in September 2020 - I encouraged her to stay active, eat healthy and remain positive.  -F/u in 6 months    2. congenital deafness.  -Communicates by reading/writing and sign language.   3. Genetics -multiple genetic variants of unknown significance on genetic testing 01-2014   4. peripheral neuropathy  -related to taxanes -Her neuropathy improved in her feet but still very present in fingers.  -She tried medications in the past that did not work for her.  -I suggest she continue Vitamin B complex, refilled today  -I encouraged her to be active and use gloves when hands are in hot or cold water.   5. Hepatic steatosis and mild transaminitis -Her prior CT abdomen and liver MRI in 2015 showed hepatic stenosis and a 1.2cm triangular subcapsular medial segment of left hepatic lobe area of enhancement, which is unchanged since 2008, likely represent perfusion  phenomena.  -She has some mild intermittent transaminitis since 2015, likely segments to hepatic steatosis, overall stable, we'll continue monitoring.  -Her repeat a CMP showed persistent transaminitis, slightly better than last year, I will repeat a abdominal ultrasound to follow-up her hepatic steatosis. -I encouraged he to be active, eat more vegetables and less red meat and fats.   6. DM -Patient has been told she is borderline diabetic.  -She is on medication for this. -Followed by PCP, her random blood sugar is 200 today, I encouraged her monitor her blood sugar at home.  7. Vitamin D Deficiency  -She has not taken calcium or vitamin D in 3-4 years. -I have encouraged her to take vitamin D and calcium -Her last menstrual cycle was in 2011-2012.  -previously prescribed 50,000 units a day for 2 months.   -she will continue Vitamin D.    Plan -Lab and f/u in 6 months  -abd [place] in the next month, will call her after her ultrasound. -screening mammogram in 01/2018 scheduled    All questions were answered. The patient knows to call the clinic with any problems, questions or concerns.  I spent 25 minutes counseling the patient face to face. The total time spent in the appointment was 30 minutes and more than 50% was on counseling.  This document serves as a record of services personally performed by Truitt Merle, MD. It was created on her behalf by Joslyn Devon, a trained medical scribe. The creation of this record is based on the scribe's personal observations and  the provider's statements to them.    I have reviewed the above documentation for accuracy and completeness, and I agree with the above.    Truitt Merle, MD   2:33 PM

## 2018-01-06 ENCOUNTER — Telehealth: Payer: Self-pay | Admitting: Hematology

## 2018-01-06 ENCOUNTER — Encounter: Payer: Self-pay | Admitting: Hematology

## 2018-01-06 ENCOUNTER — Inpatient Hospital Stay: Payer: BLUE CROSS/BLUE SHIELD | Attending: Hematology

## 2018-01-06 ENCOUNTER — Inpatient Hospital Stay (HOSPITAL_BASED_OUTPATIENT_CLINIC_OR_DEPARTMENT_OTHER): Payer: BLUE CROSS/BLUE SHIELD | Admitting: Hematology

## 2018-01-06 VITALS — BP 132/68 | HR 78 | Temp 97.7°F | Resp 20 | Ht 67.0 in | Wt 178.5 lb

## 2018-01-06 DIAGNOSIS — C50212 Malignant neoplasm of upper-inner quadrant of left female breast: Secondary | ICD-10-CM | POA: Diagnosis not present

## 2018-01-06 DIAGNOSIS — Z171 Estrogen receptor negative status [ER-]: Secondary | ICD-10-CM

## 2018-01-06 DIAGNOSIS — E559 Vitamin D deficiency, unspecified: Secondary | ICD-10-CM

## 2018-01-06 DIAGNOSIS — Z87891 Personal history of nicotine dependence: Secondary | ICD-10-CM | POA: Diagnosis not present

## 2018-01-06 DIAGNOSIS — K760 Fatty (change of) liver, not elsewhere classified: Secondary | ICD-10-CM

## 2018-01-06 DIAGNOSIS — E119 Type 2 diabetes mellitus without complications: Secondary | ICD-10-CM | POA: Insufficient documentation

## 2018-01-06 LAB — CBC WITH DIFFERENTIAL/PLATELET
BASOS ABS: 0 10*3/uL (ref 0.0–0.1)
BASOS PCT: 0 %
Eosinophils Absolute: 0.1 10*3/uL (ref 0.0–0.5)
Eosinophils Relative: 3 %
HEMATOCRIT: 42.3 % (ref 34.8–46.6)
Hemoglobin: 14.4 g/dL (ref 11.6–15.9)
Lymphocytes Relative: 19 %
Lymphs Abs: 0.9 10*3/uL (ref 0.9–3.3)
MCH: 31.9 pg (ref 25.1–34.0)
MCHC: 34 g/dL (ref 31.5–36.0)
MCV: 93.6 fL (ref 79.5–101.0)
MONO ABS: 0.2 10*3/uL (ref 0.1–0.9)
Monocytes Relative: 4 %
NEUTROS ABS: 3.7 10*3/uL (ref 1.5–6.5)
Neutrophils Relative %: 74 %
Platelets: 147 10*3/uL (ref 145–400)
RBC: 4.52 MIL/uL (ref 3.70–5.45)
RDW: 12.8 % (ref 11.2–14.5)
WBC: 4.9 10*3/uL (ref 3.9–10.3)

## 2018-01-06 LAB — COMPREHENSIVE METABOLIC PANEL
ALBUMIN: 4 g/dL (ref 3.5–5.0)
ALT: 82 U/L — ABNORMAL HIGH (ref 0–55)
AST: 46 U/L — AB (ref 5–34)
Alkaline Phosphatase: 118 U/L (ref 40–150)
Anion gap: 9 (ref 3–11)
BUN: 13 mg/dL (ref 7–26)
CHLORIDE: 105 mmol/L (ref 98–109)
CO2: 29 mmol/L (ref 22–29)
Calcium: 9.7 mg/dL (ref 8.4–10.4)
Creatinine, Ser: 0.88 mg/dL (ref 0.60–1.10)
GFR calc Af Amer: >60 mL/min (ref >60)
GFR calc non Af Amer: >60 mL/min (ref >60)
Glucose, Bld: 201 mg/dL — ABNORMAL HIGH (ref 70–140)
POTASSIUM: 4.4 mmol/L (ref 3.5–5.1)
Sodium: 143 mmol/L (ref 136–145)
Total Bilirubin: 0.7 mg/dL (ref 0.2–1.2)
Total Protein: 6.9 g/dL (ref 6.4–8.3)

## 2018-01-06 MED ORDER — VITAMIN B-6 50 MG PO TABS: 50.0000 mg | ORAL_TABLET | Freq: Two times a day (BID) | ORAL | 5 refills | Status: DC

## 2018-01-06 NOTE — Telephone Encounter (Signed)
Scheduled appt per 2/28 los - Gave patient AVS and calender per los. - central radiology to contact patient with scan

## 2018-01-07 LAB — VITAMIN D 25 HYDROXY (VIT D DEFICIENCY, FRACTURES): Vit D, 25-Hydroxy: 56.3 ng/mL (ref 30.0–100.0)

## 2018-01-20 ENCOUNTER — Ambulatory Visit
Admission: RE | Admit: 2018-01-20 | Discharge: 2018-01-20 | Disposition: A | Discharge status: Home / Self Care | Payer: Medicare Other | Source: Ambulatory Visit | Attending: Obstetrics and Gynecology | Admitting: Obstetrics and Gynecology

## 2018-01-20 DIAGNOSIS — Z9889 Other specified postprocedural states: Secondary | ICD-10-CM

## 2018-01-20 HISTORY — DX: Personal history of irradiation: Z92.3

## 2018-01-24 ENCOUNTER — Ambulatory Visit
Admission: RE | Admit: 2018-01-24 | Discharge: 2018-01-24 | Disposition: A | Discharge status: Home / Self Care | Payer: BLUE CROSS/BLUE SHIELD | Source: Ambulatory Visit | Attending: Hematology | Admitting: Hematology

## 2018-01-24 DIAGNOSIS — Z171 Estrogen receptor negative status [ER-]: Principal | ICD-10-CM

## 2018-01-24 DIAGNOSIS — C50212 Malignant neoplasm of upper-inner quadrant of left female breast: Secondary | ICD-10-CM

## 2018-01-24 DIAGNOSIS — K760 Fatty (change of) liver, not elsewhere classified: Secondary | ICD-10-CM

## 2018-01-24 DIAGNOSIS — R7989 Other specified abnormal findings of blood chemistry: Secondary | ICD-10-CM | POA: Diagnosis not present

## 2018-01-25 ENCOUNTER — Telehealth: Payer: Self-pay | Admitting: *Deleted

## 2018-01-25 NOTE — Telephone Encounter (Signed)
TCT patient @ [phone]. No answer. Message left via phone interpreter services re: [place] results. See Dr. Ernestina Penna note.

## 2018-01-25 NOTE — Telephone Encounter (Signed)
Returned the patient's call. Left a message that Dr. Ernestina Penna office called her and to cll back tomorrow

## 2018-01-26 NOTE — Telephone Encounter (Signed)
Spoke with pt through interpreter.  Informed pt of [place] results - fatty liver which is the cause of elevated liver enzymes, no concerns per Dr. Burr Medico.  Pt to follow up with her PCP to control her lipid level.  Pt voiced understanding through interpreter.

## 2018-05-04 DIAGNOSIS — C50912 Malignant neoplasm of unspecified site of left female breast: Secondary | ICD-10-CM | POA: Diagnosis not present

## 2018-05-04 DIAGNOSIS — R7303 Prediabetes: Secondary | ICD-10-CM | POA: Diagnosis not present

## 2018-05-04 DIAGNOSIS — Z1389 Encounter for screening for other disorder: Secondary | ICD-10-CM | POA: Diagnosis not present

## 2018-05-04 DIAGNOSIS — F411 Generalized anxiety disorder: Secondary | ICD-10-CM | POA: Diagnosis not present

## 2018-05-04 DIAGNOSIS — E782 Mixed hyperlipidemia: Secondary | ICD-10-CM | POA: Diagnosis not present

## 2018-06-02 DIAGNOSIS — Z01419 Encounter for gynecological examination (general) (routine) without abnormal findings: Secondary | ICD-10-CM | POA: Diagnosis not present

## 2018-06-02 DIAGNOSIS — Z6826 Body mass index (BMI) 26.0-26.9, adult: Secondary | ICD-10-CM | POA: Diagnosis not present

## 2018-06-29 NOTE — Progress Notes (Signed)
[place]  Telephone:([phone]) [phone] Fax:(336) [phone]  Clinic Follow Up Note   Patient Care Team: Regina Low, MD as PCP - General (Internal Medicine) Regina Levan, MD as Consulting Physician (Oncology) Regina Pita, MD as Consulting Physician (Radiation Oncology) Regina Overall, MD as Consulting Physician (General Surgery)   Date of Service:  07/06/2018  CHIEF COMPLAINTS:  F/u Triple negative left breast cancer    Breast cancer of upper-inner quadrant of left female breast ([place])   12/2013 Initial Diagnosis    Triple negative malignant neoplasm of breast ([place])    01/01/2014 Pathology Results    Breast, left, needle core biopsy, mass, 10 o'clock - INVASIVE DUCTAL CARCINOMA.    01/30/2014 Genetic Testing    PMS2 c.1309C>T, ATM c.8674G>A and ATM [phone] VUS's found on the Breast/Ovarian cancer panel.  The ATM [phone] VUS was identified as a mosaic result. The Breast/Ovarian gene panel offered by [facility] includes sequencing and rearrangement analysis for the following 21 genes:  ATM, BARD1, BRCA1, BRCA2, BRIP1, CDH1, CHEK2, EPCAM, FANCC, MLH1, MSH2, MSH6, NBN, PALB2, PMS2, PTEN, RAD51C, RAD51D, STK11, TP53, and XRCC2.     UPDATE: PMS2 c.1309C>T VUS has been reclassified from a variant of uncertain significance to a likely benign variant based on a combination of sources, e.g., internal data, published literature, population databases and in [place].  The updated report date is Apr 08, 2017.     03/06/2014 Surgery    S/p Lumpectomy by Regina. Lucia Watts at [facility].     03/06/2014 Pathology Results    1. Lymph node, sentinel, biopsy, Left axillary - ONE BENIGN LYMPH NODE WITH NO TUMOR SEEN (0/1). 2. Breast, lumpectomy, Left - INVASIVE GRADE II DUCTAL CARCINOMA SPANNING 1.8 CM IN GREATEST DIMENSION. - INVASIVE TUMOR IS EXTREMELY CLOSE (0.1 CM) TO POSTERIOR MARGIN ON INITIAL LUMPECTOMY SPECIMEN, PLEASE SEE SPECIMEN # 3 FOR FINAL MARGIN  STATUS. - OTHER MARGINS ARE NEGATIVE. - SEE ONCOLOGY TEMPLATE. 3. Breast, excision, Left posterior margin - BENIGN BREAST PARENCHYMA. - NO TUMOR SEEN.    03/06/2014 Receptors her2    Estrogen receptor: negative. Progesterone receptor: negative. HER-2: negative    03/30/2014 - 04/14/2014 Chemotherapy    The patient had adjuvant chemotherapy treatment, Cytoxan and Adriamycin for 4 cycles then Taxol for 12 weeks     10/22/2014 - 12/11/2014 Radiation Therapy     The patient saw Regina. Tammi Watts for radiation treatment. This is the current list of radiation treatment:  1.  The whole left breast was treated to 50.4 Gy in 28 fractions of 1.8 Gy 2.  The lumpectomy surgical site was treated to a total dose of 60.4 Gy with 5 additional fractions of 2 Gy    09/07/2017 Surgery    LEFT BREAST MASTOPEXY by Regina. Iran Watts    01/20/2018 Mammogram    IMPRESSION: No evidence of malignancy bilaterally. Bilateral lumpectomy changes    HISTORY OF PRESENTING ILLNESS (From Regina. [name] note on 09/21/2016):  Patient is seen, together with sign language interpreter, in follow up of slight new elevation in AST and ALT by labs in 08-2016, at 38 and 64 respectively. Fortunately, labs repeated for this visit are again in normal ranges. She has history of bilateral breast cancer, most recently triple negative in 12-2013; she has been on observation since completing adjuvant chemotherapy 09-2014. Most recent bilateral tomo mammograms were at [facility] 01-15-16.  Last breast MRI was 03-20-15. .  Gyn exam done by Regina Watts spring 2017, yearly. She will have PE with  Regina Watts in 10-2016 She has no regular follow up with general surgery  Patient continues to feel well, as she did at visit 08-31-16. She had viral URI after last visit, which resolved entirely. Energy and appetite are fine, no fever, no respiratory or GI symptoms. Remainder of full 10 point Review of Systems negative/ unchanged from my note  08-31-16.  PAC removed with site infection near completion of chemo.  Flu vaccine 08-31-16 3 variants of uncertain significance by Breast Ovarian panel 01-2014 (ATM, [id number]; ATM, c.8674G>A and PMS2, C.1309C>T)  CURRENT THERAPY  Surveillance   INTERIM HISTORY:  Regina Watts presents for follow up of her triple negative left breast cancer. She had breast reconstruction with Regina. Iran Watts on 09/07/2017. She is here with her interpreter. She is doing well and has no new complaints. She is eating well. She denies fatigue and is able to carry out her daily activities normally.     MEDICAL HISTORY:  Past Medical History:  Diagnosis Date  . Breast cancer ([place]) JANUARY 2002   RIGHT BREAST T4 SENTINEL NODE NEGATIVE, HER2 3+, ER/PR NEGATIVE  . Breast cancer [facility]) February 2015   Left Breast T1N0  . Congenital deafness   . Hx antineoplastic chemo   . Hx of radiation therapy   . Hyperlipidemia   . Personal history of radiation therapy   . Wears glasses    to drive    SURGICAL HISTORY: Past Surgical History:  Procedure Laterality Date  . AUGMENTATION MAMMAPLASTY Left    breast lift   . BREAST LUMPECTOMY Left 03/06/2014  . BREAST LUMPECTOMY Right 11/2000  . BREAST LUMPECTOMY WITH NEEDLE LOCALIZATION AND AXILLARY SENTINEL LYMPH NODE BX Left 03/06/2014   Procedure: BREAST LUMPECTOMY WITH NEEDLE LOCALIZATION AND AXILLARY SENTINEL LYMPH NODE BIOPSY;  Surgeon: Regina Medal, MD;  Location: [facility];  Service: General;  Laterality: Left;  . BREAST SURGERY  2002   lumpectomy on right side  . CESAREAN SECTION    . MASTOPEXY Left 09/07/2017   Procedure: LEFT BREAST MASTOPEXY;  Surgeon: Regina Limbo, MD;  Location: [place];  Service: Plastics;  Laterality: Left;  . PORT-A-CATH REMOVAL Right 08/03/2014   Procedure: REMOVAL PORT-A-CATH;  Surgeon: Regina Confer, MD;  Location: [facility];  Service: General;  Laterality: Right;  . PORTACATH PLACEMENT  Right 03/06/2014   Procedure: INSERTION PORT-A-CATH;  Surgeon: Regina Medal, MD;  Location: [facility];  Service: General;  Laterality: Right;  subclavian  . TONSILLECTOMY      SOCIAL HISTORY: Social History   Socioeconomic History  . Marital status: Married    Spouse name: Not on file  . Number of children: 1  . Years of education: Not on file  . Highest education level: Not on file  Occupational History  . Not on file  Social Needs  . Financial resource strain: Not on file  . Food insecurity:    Worry: Not on file    Inability: Not on file  . Transportation needs:    Medical: Not on file    Non-medical: Not on file  Tobacco Use  . Smoking status: Former Smoker    Years: 3.00    Last attempt to quit: 01/11/1994    Years since quitting: 24.4  . Smokeless tobacco: Never Used  Substance and Sexual Activity  . Alcohol use: Yes    Comment: very very seldom  . Drug use: No  . Sexual activity: Yes  Lifestyle  . Physical activity:  Days per week: Not on file    Minutes per session: Not on file  . Stress: Not on file  Relationships  . Social connections:    Talks on phone: Not on file    Gets together: Not on file    Attends religious service: Not on file    Active member of club or organization: Not on file    Attends meetings of clubs or organizations: Not on file    Relationship status: Not on file  . Intimate partner violence:    Fear of current or ex partner: Not on file    Emotionally abused: Not on file    Physically abused: Not on file    Forced sexual activity: Not on file  Other Topics Concern  . Not on file  Social History Narrative  . Not on file    FAMILY HISTORY: Family History  Problem Relation Age of Onset  . Heart disease Mother   . Hypertension Mother   . Hyperlipidemia Mother   . Deafness Mother   . Liver cancer Maternal Aunt   . Liver cancer Maternal Grandfather   . Deafness Father   . Prostate cancer Father 72  .  Deafness Sister   . Deafness Paternal Uncle   . Breast cancer Cousin        maternal cousin dx over 5  . Deafness Maternal Aunt   . Leukemia Cousin 2    ALLERGIES:  is allergic to taxotere [docetaxel].  MEDICATIONS:  Current Outpatient Medications  Medication Sig Dispense Refill  . acetaminophen (TYLENOL) 325 MG tablet Take 650 mg by mouth as needed.    Marland Kitchen atorvastatin (LIPITOR) 40 MG tablet Take 40 mg by mouth daily.  3  . HYDROcodone-acetaminophen (NORCO) 5-325 MG tablet Take 1 tablet by mouth every 4 (four) hours as needed for moderate pain. 10 tablet 0  . metFORMIN (GLUCOPHAGE) 500 MG tablet TAKE 1 TABLET BY MOUTH EVERY DAY WITH MEALS  6  . pyridOXINE (VITAMIN B-6) 50 MG tablet Take 1 tablet (50 mg total) by mouth 2 (two) times daily. 60 tablet 5  . sertraline (ZOLOFT) 100 MG tablet Take 1 tablet (100 mg total) by mouth daily. 30 tablet 3  . Vitamin D, Ergocalciferol, (DRISDOL) 50000 units CAPS capsule Take 1 capsule (50,000 Units total) by mouth every 7 (seven) days. 8 capsule 0   No current facility-administered medications for this visit.     REVIEW OF SYSTEMS:  Constitutional: Denies fevers, chills  (+) improving appetite and energy level Eyes: Denies blurriness of vision, double vision or watery eyes Ears, nose, mouth, throat, and face: Denies mucositis or sore throat Respiratory: Denies cough, dyspnea or wheezes Cardiovascular: Denies palpitation, chest discomfort or lower extremity swelling Gastrointestinal:  Denies nausea, heartburn or change in bowel habits (+) occasional bloating  Skin: Denies abnormal skin rashes Lymphatics: Denies new lymphadenopathy or easy bruising Neurological:Denies new weaknesses (+) neuropathy improved in feet but still present in hands Behavioral/Psych: Mood is stable, no new changes  All other systems were reviewed with the patient and are negative.  PHYSICAL EXAMINATION: ECOG PERFORMANCE STATUS: 0 - Asymptomatic  Vitals:   07/06/18  1043  BP: 109/73  Pulse: 66  Resp: 18  Temp: 98.5 F (36.9 C)  SpO2: 97%   Filed Weights   07/06/18 1043  Weight: 172 lb 6.4 oz (78.2 kg)     GENERAL:alert, no distress and comfortable SKIN: skin color, texture, turgor are normal, no rashes or significant lesions EYES: normal,  conjunctiva are pink and non-injected, sclera clear OROPHARYNX:no exudate, no erythema and lips, buccal mucosa, and tongue normal  NECK: supple, thyroid normal size, non-tender, without nodularity LYMPH:  no palpable lymphadenopathy in the cervical, axillary or inguinal LUNGS: clear to auscultation and percussion with normal breathing effort HEART: regular rate & rhythm and no murmurs and no lower extremity edema ABDOMEN:abdomen soft, non-tender and normal bowel sounds Musculoskeletal:no cyanosis of digits and no clubbing  PSYCH: alert & oriented x 3 with fluent speech NEURO: no focal motor/sensory deficits Breasts: Breast inspection showed them to be symmetrical with no nipple discharge. (+) Surgical scar of b/l breasts all healed well with mild scar tissue. No palpable mass or adenopathy.    LABORATORY DATA:  I have reviewed the data as listed CBC Latest Ref Rng & Units 07/06/2018 01/06/2018 07/07/2017  WBC 3.9 - 10.3 K/uL 4.9 4.9 5.2  Hemoglobin 11.6 - 15.9 g/dL 14.2 14.4 14.2  Hematocrit 34.8 - 46.6 % 42.6 42.3 41.9  Platelets 145 - 400 K/uL 160 147 152    CMP Latest Ref Rng & Units 07/06/2018 01/06/2018 09/01/2017  Glucose 70 - 99 mg/dL 168(H) 201(H) 104(H)  BUN 6 - 20 mg/dL _0 Creatinine 0.44 - 1.00 mg/dL 0.92 0.88 0.77  Sodium 135 - 145 mmol/L 141 143 140  Potassium 3.5 - 5.1 mmol/L 4.4 4.4 4.9  Chloride 98 - 111 mmol/L 104 105 104  CO2 22 - 32 mmol/L _1 Calcium 8.9 - 10.3 mg/dL 10.7(H) 9.7 9.9  Total Protein 6.5 - 8.1 g/dL 7.3 6.9 -  Total Bilirubin 0.3 - 1.2 mg/dL 0.5 0.7 -  Alkaline Phos 38 - 126 U/L 111 118 -  AST 15 - 41 U/L 50(H) 46(H) -  ALT 0 - 44 U/L 79(H) 82(H) -    PATHOLOGY:  09/07/2017 Surgical Pathology Diagnosis Breast, Mammoplasty, Left - BENIGN BREAST TISSUE AND SKIN. Vicente Males MD Pathologist, Electronic Signature (Case signed 09/08/2017) Specimen Gross and Clinical Information Specimen(s) Obtained: Breast, Mammoplasty, Left Specimen Clinical Information History of bilateral breast cancer ([place])  Diagnosis 03/06/2014 1. Lymph node, sentinel, biopsy, Left axillary - ONE BENIGN LYMPH NODE WITH NO TUMOR SEEN (0/1). 2. Breast, lumpectomy, Left - INVASIVE GRADE II DUCTAL CARCINOMA SPANNING 1.8 CM IN GREATEST DIMENSION. - INVASIVE TUMOR IS EXTREMELY CLOSE (0.1 CM) TO POSTERIOR MARGIN ON INITIAL LUMPECTOMY SPECIMEN, PLEASE SEE SPECIMEN # 3 FOR FINAL MARGIN STATUS. - OTHER MARGINS ARE NEGATIVE. - SEE ONCOLOGY TEMPLATE. 3. Breast, excision, Left posterior margin - BENIGN BREAST PARENCHYMA. - NO TUMOR SEEN.  Diagnosis 01/01/2014 Breast, left, needle core biopsy, mass, 10 o'clock - INVASIVE DUCTAL CARCINOMA. - SEE COMMENT. Microscopic Comment Although definitive grading of breast carcinoma is best done on excision, the features of the invasive tumor from the left needle core biopsy at 10 o'clock are compatible with a grade I-II breast carcinoma. Breast prognostic markers will be performed and reported in an addendum. Findings are called to the [facility] on 01/02/14. Regina. Donato Heinz has seen this case in consultation with agreement. (RAH:gt, 01/02/14)  RADIOGRAPHIC STUDIES: I have personally reviewed the radiological images as listed and agreed with the findings in the report.  MRI Breast B/l 08/04/17 IMPRESSION: No abnormal enhancement in either breast. RECOMMENDATION: Bilateral diagnostic mammogram in March of 2019 is recommended.   Breast MRI b/l 06/09/2016 IMPRESSION: Post lumpectomy changes bilaterally. No MRI evidence of malignancy. Scarring in the upper medial right breast.  Diagnostic Mammogram b/l  01/15/2016 IMPRESSION: 1.  Stable bilateral lumpectomy sites. No evidence of disease recurrence.  2. No mammographic evidence of malignancy in the bilateral breasts.  ASSESSMENT & PLAN:  Ms. Darrington is a 57 y.o. female with:  1.Triple negative left breast cancer T1cN0 diagnosed 12-2013, history of stage II right breast cancer HR-/HER2+ in 2002 -I previously reviewed her medical history extensively, and confirmed the findings with patient  -She had a history of triple negative breast cancer, stage I A, treated with lumpectomy and sentinel node evaluation 03-06-14. Adjuvant chemotherapy completed 09-10-2014; RT completed 12-11-14.  -history T2N0M0 right breast cancer 11-2000 at age 68 treated with lumpectomy, 2 sentinel nodes and chemotherapy -It is more than 4 years since her diagnosis.  Her risk of recurrence has significantly reduced at this point. I will see her for a complete 5 years of surveillance and then annually afterward.  -She is clinically doing well. Lab reviewed, her CBC and CMP within normal limits except mild hypercalcemia and stable mild transaminitis.  Her physical exam and her 01/2018 B/l Breast mammogram was unremarkable. There is no clinical concern for recurrence.  -We'll continue annual screening mammogram. I will schedule for a mammogram in 01/2019. -she will check her copay for her MRI, if high, will do every other year, next due in September 2020  - I encouraged her to stay active, eat healthy and remain positive.  -F/u in one year   2. congenital deafness.  -Communicates by reading/writing and sign language.   3. Genetics -multiple genetic variants of unknown significance on genetic testing 01-2014   4. peripheral neuropathy  -related to taxanes -Her neuropathy improved in her feet but still very present in fingers.  -She tried medications in the past that did not work for her.  -I suggest she continue Vitamin B complex, refilled today  -I encouraged her to be active  and use gloves when hands are in hot or cold water.   5. Hepatic steatosis and mild transaminitis -Her prior CT abdomen and liver MRI in 2015 showed hepatic stenosis and a 1.2cm triangular subcapsular medial segment of left hepatic lobe area of enhancement, which is unchanged since 2008, likely represent perfusion phenomena.  -She has some mild intermittent transaminitis since 2015, likely segments to hepatic steatosis, Watts stable, we'll continue monitoring.  -Her CMP was pending today. -I encouraged he to be active, eat more vegetables and less red meat and fats.   6. DM -Patient has been told she is borderline diabetic.  -She is on medication for this. -Followed by PCP, I encouraged her monitor her blood sugar at home  7. Vitamin D Deficiency  -She has not taken calcium or vitamin D in 3-4 years. -I have encouraged her to take vitamin D and calcium -Her last menstrual cycle was in 2011-2012.  -previously prescribed 50,000 units a day for 2 months.   -she will continue Vitamin D.  Her repeated vitamin D level was normal in February 2019. -Due to her mild hypercalcemia, she should not take calcium supplement, and to remain her vitamin D supplement at a Watts level such as 800 units daily   Plan -Lab and f/u in one year  -screening mammogram in 01/2019, ordered today  -Avoid calcium supplement, and continue Watts-dose vitamin D   All questions were answered. The patient knows to call the clinic with any problems, questions or concerns.  I spent 20 minutes counseling the patient face to face. The total time spent in the appointment was 25 minutes and more than 50%  was on counseling.  Dierdre Searles Dweik am acting as scribe for Regina. Truitt Merle.  I have reviewed the above documentation for accuracy and completeness, and I agree with the above.    Truitt Merle, MD   5:30 PM

## 2018-07-06 ENCOUNTER — Inpatient Hospital Stay: Payer: BLUE CROSS/BLUE SHIELD | Attending: Hematology | Admitting: Hematology

## 2018-07-06 ENCOUNTER — Inpatient Hospital Stay: Payer: BLUE CROSS/BLUE SHIELD

## 2018-07-06 ENCOUNTER — Encounter: Payer: Self-pay | Admitting: Hematology

## 2018-07-06 ENCOUNTER — Telehealth: Payer: Self-pay | Admitting: Hematology

## 2018-07-06 VITALS — BP 109/73 | HR 66 | Temp 98.5°F | Resp 18 | Ht 67.0 in | Wt 172.4 lb

## 2018-07-06 DIAGNOSIS — Z171 Estrogen receptor negative status [ER-]: Secondary | ICD-10-CM

## 2018-07-06 DIAGNOSIS — Z87891 Personal history of nicotine dependence: Secondary | ICD-10-CM | POA: Insufficient documentation

## 2018-07-06 DIAGNOSIS — Z9221 Personal history of antineoplastic chemotherapy: Secondary | ICD-10-CM | POA: Insufficient documentation

## 2018-07-06 DIAGNOSIS — E559 Vitamin D deficiency, unspecified: Secondary | ICD-10-CM | POA: Insufficient documentation

## 2018-07-06 DIAGNOSIS — C50212 Malignant neoplasm of upper-inner quadrant of left female breast: Secondary | ICD-10-CM

## 2018-07-06 DIAGNOSIS — K760 Fatty (change of) liver, not elsewhere classified: Secondary | ICD-10-CM | POA: Diagnosis not present

## 2018-07-06 DIAGNOSIS — Z923 Personal history of irradiation: Secondary | ICD-10-CM | POA: Diagnosis not present

## 2018-07-06 DIAGNOSIS — G620 Drug-induced polyneuropathy: Secondary | ICD-10-CM | POA: Diagnosis not present

## 2018-07-06 DIAGNOSIS — E119 Type 2 diabetes mellitus without complications: Secondary | ICD-10-CM | POA: Diagnosis not present

## 2018-07-06 LAB — COMPREHENSIVE METABOLIC PANEL
ALBUMIN: 4.1 g/dL (ref 3.5–5.0)
ALK PHOS: 111 U/L (ref 38–126)
ALT: 79 U/L — AB (ref 0–44)
ANION GAP: 8 (ref 5–15)
AST: 50 U/L — ABNORMAL HIGH (ref 15–41)
BUN: 15 mg/dL (ref 6–20)
CALCIUM: 10.7 mg/dL — AB (ref 8.9–10.3)
CO2: 29 mmol/L (ref 22–32)
Chloride: 104 mmol/L (ref 98–111)
Creatinine, Ser: 0.92 mg/dL (ref 0.44–1.00)
GFR calc Af Amer: >60 mL/min (ref >60)
GFR calc non Af Amer: >60 mL/min (ref >60)
GLUCOSE: 168 mg/dL — AB (ref 70–99)
Potassium: 4.4 mmol/L (ref 3.5–5.1)
Sodium: 141 mmol/L (ref 135–145)
Total Bilirubin: 0.5 mg/dL (ref 0.3–1.2)
Total Protein: 7.3 g/dL (ref 6.5–8.1)

## 2018-07-06 LAB — CBC WITH DIFFERENTIAL/PLATELET
BASOS PCT: 1 %
Basophils Absolute: 0 10*3/uL (ref 0.0–0.1)
Eosinophils Absolute: 0.2 10*3/uL (ref 0.0–0.5)
Eosinophils Relative: 4 %
HEMATOCRIT: 42.6 % (ref 34.8–46.6)
Hemoglobin: 14.2 g/dL (ref 11.6–15.9)
LYMPHS PCT: 23 %
Lymphs Abs: 1.1 10*3/uL (ref 0.9–3.3)
MCH: 30.8 pg (ref 25.1–34.0)
MCHC: 33.4 g/dL (ref 31.5–36.0)
MCV: 92.3 fL (ref 79.5–101.0)
MONO ABS: 0.3 10*3/uL (ref 0.1–0.9)
Monocytes Relative: 6 %
NEUTROS ABS: 3.3 10*3/uL (ref 1.5–6.5)
Neutrophils Relative %: 66 %
Platelets: 160 10*3/uL (ref 145–400)
RBC: 4.61 MIL/uL (ref 3.70–5.45)
RDW: 13.3 % (ref 11.2–14.5)
WBC: 4.9 10*3/uL (ref 3.9–10.3)

## 2018-07-06 NOTE — Telephone Encounter (Signed)
Gave pt avs and calendar  °

## 2018-11-19 ENCOUNTER — Encounter (HOSPITAL_COMMUNITY): Payer: Self-pay | Admitting: Family Medicine

## 2018-11-19 ENCOUNTER — Other Ambulatory Visit: Payer: Self-pay

## 2018-11-19 ENCOUNTER — Ambulatory Visit (HOSPITAL_COMMUNITY)
Admission: EM | Admit: 2018-11-19 | Discharge: 2018-11-19 | Disposition: A | Discharge status: Home / Self Care | Payer: Medicare Other | Attending: Family Medicine | Admitting: Family Medicine

## 2018-11-19 DIAGNOSIS — B349 Viral infection, unspecified: Secondary | ICD-10-CM | POA: Diagnosis not present

## 2018-11-19 MED ORDER — BENZONATATE 100 MG PO CAPS: 100.0000 mg | ORAL_CAPSULE | Freq: Three times a day (TID) | ORAL | 0 refills | Status: DC | PRN

## 2018-11-19 MED ORDER — PREDNISONE 50 MG PO TABS: ORAL_TABLET | ORAL | 0 refills | Status: DC

## 2018-11-19 NOTE — ED Triage Notes (Signed)
The patient presented to the [facility] with a complaint of a sore throat and bilateral otalgia x 1 month.

## 2018-11-19 NOTE — ED Provider Notes (Signed)
[place]    CSN: [id number] Arrival date & time: 11/19/18  1725     History   Chief Complaint Chief Complaint  Patient presents with  . Sore Throat  . Otalgia    HPI Regina Watts is a 58 y.o. female.   This is the initial urgent care visit for this 58 year old woman who complains of a sore throat and bilateral otalgia x 1 month.  Patient is deaf but she has a daughter who is interpreting for her.  Patient had no fever or hemoptysis.  She has had a persistent cough as well.     Past Medical History:  Diagnosis Date  . Breast cancer ([place]) JANUARY 2002   RIGHT BREAST T4 SENTINEL NODE NEGATIVE, HER2 3+, ER/PR NEGATIVE  . Breast cancer [facility]) February 2015   Left Breast T1N0  . Congenital deafness   . Hx antineoplastic chemo   . Hx of radiation therapy   . Hyperlipidemia   . Personal history of radiation therapy   . Wears glasses    to drive    Patient Active Problem List   Diagnosis Date Noted  . Vitamin D deficiency 01/04/2017  . Breast cancer of upper-inner quadrant of left female breast ([place]) 09/02/2016  . Thrombocytopenia ([place]) 10/09/2015  . Lymphedema of breast 10/09/2015  . Genetic testing 09/23/2015  . Peripheral neuropathy due to chemotherapy ([place]) 08/14/2014  . Cellulitis of chest wall 08/01/2014  . Port-A-Cath in place 08/01/2014  . Altered mental status 07/29/2014  . Transaminasemia 07/29/2014  . Conjunctivitis 07/29/2014  . Deafness congenital 03/20/2014  . History of right breast cancer 01/11/2014    Past Surgical History:  Procedure Laterality Date  . AUGMENTATION MAMMAPLASTY Left    breast lift   . BREAST LUMPECTOMY Left 03/06/2014  . BREAST LUMPECTOMY Right 11/2000  . BREAST LUMPECTOMY WITH NEEDLE LOCALIZATION AND AXILLARY SENTINEL LYMPH NODE BX Left 03/06/2014   Procedure: BREAST LUMPECTOMY WITH NEEDLE LOCALIZATION AND AXILLARY SENTINEL LYMPH NODE BIOPSY;  Surgeon: Shann Medal, MD;  Location: [place];   Service: General;  Laterality: Left;  . BREAST SURGERY  2002   lumpectomy on right side  . CESAREAN SECTION    . MASTOPEXY Left 09/07/2017   Procedure: LEFT BREAST MASTOPEXY;  Surgeon: Irene Limbo, MD;  Location: [place];  Service: Plastics;  Laterality: Left;  . PORT-A-CATH REMOVAL Right 08/03/2014   Procedure: REMOVAL PORT-A-CATH;  Surgeon: Jackolyn Confer, MD;  Location: [facility];  Service: General;  Laterality: Right;  . PORTACATH PLACEMENT Right 03/06/2014   Procedure: INSERTION PORT-A-CATH;  Surgeon: Shann Medal, MD;  Location: [place];  Service: General;  Laterality: Right;  subclavian  . TONSILLECTOMY      OB History   No obstetric history on file.      Home Medications    Prior to Admission medications   Medication Sig Start Date End Date Taking? Authorizing Provider  acetaminophen (TYLENOL) 325 MG tablet Take 650 mg by mouth as needed.    [provider]  atorvastatin (LIPITOR) 40 MG tablet Take 40 mg by mouth daily. 07/15/16   [provider]  benzonatate (TESSALON) 100 MG capsule Take 1-2 capsules (100-200 mg total) by mouth 3 (three) times daily as needed for cough. 11/19/18   Robyn Haber, MD  HYDROcodone-acetaminophen (NORCO) 5-325 MG tablet Take 1 tablet by mouth every 4 (four) hours as needed for moderate pain. 09/07/17   Irene Limbo, MD  metFORMIN (GLUCOPHAGE)  500 MG tablet TAKE 1 TABLET BY MOUTH EVERY DAY WITH MEALS 12/27/16   [provider]  predniSONE (DELTASONE) 50 MG tablet One tablet every morning with breakfast 11/19/18   Robyn Haber, MD  pyridOXINE (VITAMIN B-6) 50 MG tablet Take 1 tablet (50 mg total) by mouth 2 (two) times daily. 01/06/18   Truitt Merle, MD  sertraline (ZOLOFT) 100 MG tablet Take 1 tablet (100 mg total) by mouth daily. 07/02/14   Gordy Levan, MD  Vitamin D, Ergocalciferol, (DRISDOL) 50000 units CAPS capsule Take 1 capsule (50,000 Units total) by mouth every 7  (seven) days. 01/04/17   Truitt Merle, MD    Family History Family History  Problem Relation Age of Onset  . Heart disease Mother   . Hypertension Mother   . Hyperlipidemia Mother   . Deafness Mother   . Liver cancer Maternal Aunt   . Liver cancer Maternal Grandfather   . Deafness Father   . Prostate cancer Father 12  . Deafness Sister   . Deafness Paternal Uncle   . Breast cancer Cousin        maternal cousin dx over 20  . Deafness Maternal Aunt   . Leukemia Cousin 2    Social History Social History   Tobacco Use  . Smoking status: Former Smoker    Years: 3.00    Last attempt to quit: 01/11/1994    Years since quitting: 24.8  . Smokeless tobacco: Never Used  Substance Use Topics  . Alcohol use: Yes    Comment: very very seldom  . Drug use: No     Allergies   Taxotere [docetaxel]   Review of Systems Review of Systems   Physical Exam Triage Vital Signs ED Triage Vitals [11/19/18 1811]  Enc Vitals Group     BP 119/80     Pulse Rate 77     Resp 18     Temp 99 F (37.2 C)     Temp Source Oral     SpO2 95 %     Weight      Height      Head Circumference      Peak Flow      Pain Score 5     Pain Loc      Pain Edu?      Excl. in [place]?    No data found.  Updated Vital Signs BP 119/80 (BP Location: Left Arm)   Pulse 77   Temp 99 F (37.2 C) (Oral)   Resp 18   SpO2 95%    Physical Exam Vitals signs and nursing note reviewed.  Constitutional:      General: She is not in acute distress.    Appearance: She is well-developed and normal weight.  HENT:     Head: Normocephalic and atraumatic.     Right Ear: Tympanic membrane and ear canal normal.     Left Ear: Tympanic membrane and ear canal normal.     Nose: Congestion present.     Mouth/Throat:     Mouth: Mucous membranes are moist.     Pharynx: Oropharynx is clear.  Eyes:     Conjunctiva/sclera: Conjunctivae normal.  Neck:     Musculoskeletal: Normal range of motion and neck supple.    Cardiovascular:     Rate and Rhythm: Normal rate.     Heart sounds: Normal heart sounds.  Pulmonary:     Effort: Pulmonary effort is normal.     Breath sounds: Normal breath  sounds.  Skin:    General: Skin is warm and dry.  Neurological:     General: No focal deficit present.     Mental Status: She is alert and oriented to person, place, and time.  Psychiatric:        Mood and Affect: Mood normal.        Behavior: Behavior normal.      UC Treatments / Results  Labs (all labs ordered are listed, but only abnormal results are displayed) Labs Reviewed - No data to display  EKG None  Radiology No results found.  Procedures Procedures (including critical care time)  Medications Ordered in UC Medications - No data to display  Initial Impression / Assessment and Plan / UC Course  I have reviewed the triage vital signs and the nursing notes.  Pertinent labs & imaging results that were available during my care of the patient were reviewed by me and considered in my medical decision making (see chart for details).    Final Clinical Impressions(s) / UC Diagnoses   Final diagnoses:  Acute viral syndrome   Discharge Instructions   None    ED Prescriptions    Medication Sig Dispense Auth. Provider   predniSONE (DELTASONE) 50 MG tablet One tablet every morning with breakfast 5 tablet Robyn Haber, MD   benzonatate (TESSALON) 100 MG capsule Take 1-2 capsules (100-200 mg total) by mouth 3 (three) times daily as needed for cough. 40 capsule Robyn Haber, MD     Controlled Substance Prescriptions [place] Controlled Substance Registry consulted? Not Applicable   Robyn Haber, MD 11/19/18 [place]

## 2018-12-07 ENCOUNTER — Telehealth: Payer: Self-pay

## 2018-12-07 NOTE — Telephone Encounter (Signed)
Spoke with patient regarding the numbness in her fingertips, no medication at this point that would help much, offered for Dr. Burr Medico to write a letter stating her condition which makes it difficult for her to pick up small objects and suggesting moving her to a different area of [place] where she picks up larger objects.  She agreed this would be beneficial and asks that this be mailed to her home address.

## 2018-12-07 NOTE — Telephone Encounter (Signed)
Patient called on 1/28 at 16:23 calls with regard to numbness in her fingertips, having difficulty picking things up at work, needs advice/medical help to address this.   [phone]

## 2018-12-08 ENCOUNTER — Telehealth: Payer: Self-pay

## 2018-12-08 NOTE — Telephone Encounter (Signed)
Mailed letter to patient per her request.

## 2019-02-02 ENCOUNTER — Other Ambulatory Visit: Payer: Self-pay

## 2019-02-02 ENCOUNTER — Ambulatory Visit
Admission: RE | Admit: 2019-02-02 | Discharge: 2019-02-02 | Disposition: A | Discharge status: Home / Self Care | Payer: Medicare Other | Source: Ambulatory Visit | Attending: Hematology | Admitting: Hematology

## 2019-02-02 ENCOUNTER — Other Ambulatory Visit: Payer: Self-pay | Admitting: Hematology

## 2019-02-02 DIAGNOSIS — Z171 Estrogen receptor negative status [ER-]: Principal | ICD-10-CM

## 2019-02-02 DIAGNOSIS — N6320 Unspecified lump in the left breast, unspecified quadrant: Secondary | ICD-10-CM

## 2019-02-02 DIAGNOSIS — C50212 Malignant neoplasm of upper-inner quadrant of left female breast: Secondary | ICD-10-CM

## 2019-02-02 DIAGNOSIS — N63 Unspecified lump in unspecified breast: Secondary | ICD-10-CM

## 2019-04-19 ENCOUNTER — Encounter: Payer: Self-pay | Admitting: Hematology

## 2019-04-19 ENCOUNTER — Telehealth: Payer: Self-pay | Admitting: *Deleted

## 2019-04-19 NOTE — Telephone Encounter (Signed)
Letter is done, please mail to her, thanks   Truitt Merle MD

## 2019-04-19 NOTE — Telephone Encounter (Signed)
Letter to be mailed tomorrow to pt.

## 2019-04-19 NOTE — Telephone Encounter (Signed)
Received call via deaf interpreter.  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  She wants to cont her normal 10 hours but less heavy work.  She needs a letter stating this & would like it mailed to her home. Message to DR Burr Medico.

## 2019-06-26 ENCOUNTER — Telehealth: Payer: Self-pay | Admitting: *Deleted

## 2019-06-26 NOTE — Telephone Encounter (Signed)
Let her know that I will write the letter on her next OV on 8/26 to ensure the accuracy. Thanks    Truitt Merle MD

## 2019-06-26 NOTE — Telephone Encounter (Signed)
Received call from pt through deaf interpreter stating that her work needs a more detailed letter regarding work restrictions for light duty.  Asked what she would like to do & she states 8 hours ( she has been doing 10 hrs) & maybe lifting 10 # or < & able to take frequent breaks.  She needs letter sent to her.  Message sent to Dr Burr Medico.

## 2019-06-28 ENCOUNTER — Telehealth: Payer: Self-pay | Admitting: *Deleted

## 2019-06-28 NOTE — Telephone Encounter (Signed)
Called & spoke with pt's husband & gave message that Dr Burr Medico will write her letter after she sees pt on 07/05/19 so that it will be accurate.  Husband expressed understanding & states will give her the message.

## 2019-06-29 DIAGNOSIS — Z13820 Encounter for screening for osteoporosis: Secondary | ICD-10-CM | POA: Diagnosis not present

## 2019-06-29 DIAGNOSIS — Z01419 Encounter for gynecological examination (general) (routine) without abnormal findings: Secondary | ICD-10-CM | POA: Diagnosis not present

## 2019-06-29 DIAGNOSIS — Z6825 Body mass index (BMI) 25.0-25.9, adult: Secondary | ICD-10-CM | POA: Diagnosis not present

## 2019-07-05 ENCOUNTER — Inpatient Hospital Stay: Payer: Medicare Other | Admitting: Hematology

## 2019-07-05 ENCOUNTER — Telehealth: Payer: Self-pay | Admitting: Hematology

## 2019-07-05 ENCOUNTER — Inpatient Hospital Stay: Payer: Medicare Other

## 2019-07-05 NOTE — Telephone Encounter (Signed)
Patient called regarding rescheduling 08/26 appointment, appointment has moved to 09/11.

## 2019-07-14 NOTE — Progress Notes (Signed)
[facility]   [phone] [phone]   Clinic Follow up Note   Patient Care Team: Husain, Karrar, MD as PCP - General (Internal Medicine) Livesay, Lennis P, MD as Consulting Physician (Oncology) Manning, Matthew, MD as Consulting Physician (Radiation Oncology) Newman, David, MD as Consulting Physician (General Surgery)  Date of Service:  07/21/2019  CHIEF COMPLAINT: F/u Triple negative left breast cancer  SUMMARY OF ONCOLOGIC HISTORY: Oncology History  Breast cancer of upper-inner quadrant of left female breast (HCC)  12/2013 Initial Diagnosis   Triple negative malignant neoplasm of breast (HCC)   01/01/2014 Pathology Results   Breast, left, needle core biopsy, mass, 10 o'clock - INVASIVE DUCTAL CARCINOMA.   01/30/2014 Genetic Testing   PMS2 c.1309C>T, ATM c.8674G>A and ATM c.3874_3885del12 VUS's found on the Breast/Ovarian cancer panel.  The ATM c.3874_3885del12 VUS was identified as a mosaic result. The Breast/Ovarian gene panel offered by [facility] includes sequencing and rearrangement analysis for the following 21 genes:  ATM, BARD1, BRCA1, BRCA2, BRIP1, CDH1, CHEK2, EPCAM, FANCC, MLH1, MSH2, MSH6, NBN, PALB2, PMS2, PTEN, RAD51C, RAD51D, STK11, TP53, and XRCC2.     UPDATE: PMS2 c.1309C>T VUS has been reclassified from a variant of uncertain significance to a likely benign variant based on a combination of sources, e.g., internal data, published literature, population databases and in silico models.  The updated report date is Apr 08, 2017.    03/06/2014 Surgery   S/p Lumpectomy by Dr. Newman at [place].    03/06/2014 Pathology Results   1. Lymph node, sentinel, biopsy, Left axillary - ONE BENIGN LYMPH NODE WITH NO TUMOR SEEN (0/1). 2. Breast, lumpectomy, Left - INVASIVE GRADE II DUCTAL CARCINOMA SPANNING 1.8 CM IN GREATEST DIMENSION. - INVASIVE TUMOR IS EXTREMELY CLOSE (0.1 CM) TO POSTERIOR MARGIN ON INITIAL LUMPECTOMY SPECIMEN, PLEASE SEE  SPECIMEN # 3 FOR FINAL MARGIN STATUS. - OTHER MARGINS ARE NEGATIVE. - SEE ONCOLOGY TEMPLATE. 3. Breast, excision, Left posterior margin - BENIGN BREAST PARENCHYMA. - NO TUMOR SEEN.   03/06/2014 Receptors her2   Estrogen receptor: negative. Progesterone receptor: negative. HER-2: negative   03/30/2014 - 04/14/2014 Chemotherapy   The patient had adjuvant chemotherapy treatment, Cytoxan and Adriamycin for 4 cycles then Taxol for 12 weeks    10/22/2014 - 12/11/2014 Radiation Therapy    The patient saw Dr. Manning for radiation treatment. This is the current list of radiation treatment:  1.  The whole left breast was treated to 50.4 Gy in 28 fractions of 1.8 Gy 2.  The lumpectomy surgical site was treated to a total dose of 60.4 Gy with 5 additional fractions of 2 Gy   09/07/2017 Surgery   LEFT BREAST MASTOPEXY by Dr. Thimmappa   01/20/2018 Mammogram   IMPRESSION: No evidence of malignancy bilaterally. Bilateral lumpectomy changes      CURRENT THERAPY:  Surveillance   INTERVAL HISTORY:  Regina Watts is here for a follow up of triple negative left breast cancer. She was last seen by me 1 year ago. She presents to the clinic with her interpretor. She notes she is doing well. Her last mammogram in 01/2019 showed oil cyst. She has not felt it herself. She plans to have repeat scan on 9/28. She notes no other new changes since her last visit. She has no GI or chest issues.  REVIEW OF SYSTEMS:   Constitutional: Denies fevers, chills or abnormal weight loss Eyes: Denies blurriness of vision Ears, nose, mouth, throat, and face: Denies mucositis or sore throat Respiratory: Denies   cough, dyspnea or wheezes Cardiovascular: Denies palpitation, chest discomfort or lower extremity swelling Gastrointestinal:  Denies nausea, heartburn or change in bowel habits Skin: Denies abnormal skin rashes Lymphatics: Denies new lymphadenopathy or easy bruising Neurological:Denies numbness, tingling or new  weaknesses Behavioral/Psych: Mood is stable, no new changes  All other systems were reviewed with the patient and are negative.  MEDICAL HISTORY:  Past Medical History:  Diagnosis Date  . Breast cancer ([place]) JANUARY 2002   RIGHT BREAST T4 SENTINEL NODE NEGATIVE, HER2 3+, ER/PR NEGATIVE  . Breast cancer [facility]) February 2015   Left Breast T1N0  . Congenital deafness   . Hx antineoplastic chemo   . Hx of radiation therapy   . Hyperlipidemia   . Personal history of radiation therapy   . Wears glasses    to drive    SURGICAL HISTORY: Past Surgical History:  Procedure Laterality Date  . AUGMENTATION MAMMAPLASTY Left    breast lift   . BREAST LUMPECTOMY Left 03/06/2014  . BREAST LUMPECTOMY Right 11/2000  . BREAST LUMPECTOMY WITH NEEDLE LOCALIZATION AND AXILLARY SENTINEL LYMPH NODE BX Left 03/06/2014   Procedure: BREAST LUMPECTOMY WITH NEEDLE LOCALIZATION AND AXILLARY SENTINEL LYMPH NODE BIOPSY;  Surgeon: Shann Medal, MD;  Location: [place];  Service: General;  Laterality: Left;  . BREAST SURGERY  2002   lumpectomy on right side  . CESAREAN SECTION    . MASTOPEXY Left 09/07/2017   Procedure: LEFT BREAST MASTOPEXY;  Surgeon: Irene Limbo, MD;  Location: [place];  Service: Plastics;  Laterality: Left;  . PORT-A-CATH REMOVAL Right 08/03/2014   Procedure: REMOVAL PORT-A-CATH;  Surgeon: Jackolyn Confer, MD;  [facility];  Service: General;  Laterality: Right;  . PORTACATH PLACEMENT Right 03/06/2014   Procedure: INSERTION PORT-A-CATH;  Surgeon: Shann Medal, MD;  Location: [place];  Service: General;  Laterality: Right;  subclavian  . TONSILLECTOMY      I have reviewed the social history and family history with the patient and they are unchanged from previous note.  ALLERGIES:  is allergic to taxotere [docetaxel].  MEDICATIONS:  Current Outpatient Medications  Medication Sig Dispense Refill  . atorvastatin (LIPITOR) 40  MG tablet Take 40 mg by mouth daily.  3  . metFORMIN (GLUCOPHAGE) 500 MG tablet TAKE 1 TABLET BY MOUTH EVERY DAY WITH MEALS  6  . pyridOXINE (VITAMIN B-6) 50 MG tablet Take 1 tablet (50 mg total) by mouth 2 (two) times daily. 60 tablet 5  . sertraline (ZOLOFT) 100 MG tablet Take 1 tablet (100 mg total) by mouth daily. 30 tablet 3   No current facility-administered medications for this visit.     PHYSICAL EXAMINATION: ECOG PERFORMANCE STATUS: 0 - Asymptomatic  Vitals:   07/21/19 1547  BP: 135/81  Pulse: 78  Resp: 18  Temp: 98.3 F (36.8 C)  SpO2: 98%   There were no vitals filed for this visit.  GENERAL:alert, no distress and comfortable SKIN: skin color, texture, turgor are normal, no rashes or significant lesions EYES: normal, Conjunctiva are pink and non-injected, sclera clear  NECK: supple, thyroid normal size, non-tender, without nodularity LYMPH:  no palpable lymphadenopathy in the cervical, axillary  LUNGS: clear to auscultation and percussion with normal breathing effort HEART: regular rate & rhythm and no murmurs and no lower extremity edema ABDOMEN:abdomen soft, non-tender and normal bowel sounds Musculoskeletal:no cyanosis of digits and no clubbing  NEURO: alert & oriented x 3 with fluent speech, no focal motor/sensory deficits  BREAST: S/p b/l lumpectomies: Surgical incisions healed well (+) moderate scar tissue of left breast and mild scar tissue of right breast. No palpable mass, nodules or adenopathy bilaterally. Breast exam benign.   LABORATORY DATA:  I have reviewed the data as listed CBC Latest Ref Rng & Units 07/21/2019 07/06/2018 01/06/2018  WBC 4.0 - 10.5 K/uL 6.8 4.9 4.9  Hemoglobin 12.0 - 15.0 g/dL 13.4 14.2 14.4  Hematocrit 36.0 - 46.0 % 39.7 42.6 42.3  Platelets 150 - 400 K/uL 150 160 147     CMP Latest Ref Rng & Units 07/21/2019 07/06/2018 01/06/2018  Glucose 70 - 99 mg/dL 133(H) 168(H) 201(H)  BUN 6 - 20 mg/dL 14 15 13  Creatinine 0.44 - 1.00 mg/dL  0.94 0.92 0.88  Sodium 135 - 145 mmol/L 143 141 143  Potassium 3.5 - 5.1 mmol/L 3.9 4.4 4.4  Chloride 98 - 111 mmol/L 107 104 105  CO2 22 - 32 mmol/L 27 29 29  Calcium 8.9 - 10.3 mg/dL 9.5 10.7(H) 9.7  Total Protein 6.5 - 8.1 g/dL 6.8 7.3 6.9  Total Bilirubin 0.3 - 1.2 mg/dL 0.5 0.5 0.7  Alkaline Phos 38 - 126 U/L 121 111 118  AST 15 - 41 U/L 29 50(H) 46(H)  ALT 0 - 44 U/L 46(H) 79(H) 82(H)      RADIOGRAPHIC STUDIES: I have personally reviewed the radiological images as listed and agreed with the findings in the report. No results found.   ASSESSMENT & PLAN:  Regina Watts is a 58 y.o. female with   1.Triple negative left breast cancer T1cN0 diagnosed 12-2013, history of stage II right breast cancer HR-/HER2+ in 2002 -I previously reviewed her medical history extensively, and confirmed the findings with patient  -She has history of T2N0M0 right breast cancer 11-2000 at age 39 treated with right lumpectomy, 2 sentinel nodes and chemotherapy -She had a history of triple negative breast cancer, stage I A, treated with left lumpectomy and sentinel node evaluation 03-06-14. Adjuvant chemotherapy completed 09-10-2014; RT completed 12-11-14.  -Her 01/2019 Mammogram/US showed likely benign cyst or oil cyst in upper left breast. No evidence of malignancy in right breast. Will repeat mammogram, US on 08/07/19.  -She is otherwise clinically doing well. Lab reviewed, her CBC and CMP are within normal limits except BG 133, ALT 46. VitD still pending. Her physical exam was unremarkable except b/l incision scar tissue. No palpable mass or LN. There is no clinical concern for recurrence. -She is 5.5 years since her triple negative breast cancer diagnosis. Her risk of recurrence has significantly reduced at this point. She would like to continue yearly follows with our clinic.  -Continue breast cancer surveillance. F/u in 11 months than yearly.    2. Congenital deafness -Communicates by reading/writing and  sign language.   3. Genetics -multiple genetic variants of unknown significance on genetic testing 01-2014   4. Peripheral neuropathy  -related to prior taxanes -Her neuropathy improved in her feet but still very present in fingers.  -She tried medications in the past that did not work for her.  -I previously suggested she continue Vitamin B complex -I encouraged her to be active and use gloves when hands are in hot or cold water.   5. Hepatic steatosisand mild transaminitis -Her prior CT abdomen and liver MRI in 2015 showed hepatic stenosisand a 1.2cmtriangular subcapsular medial segment of left hepatic lobe area of enhancement, which is unchanged since 2008,likely represent perfusion phenomena. -She has some mild intermittent transaminitis since 2015,   likely segments to hepatic steatosis,overall stable, we'll continue monitoring.  -I previously encouraged he to be active, eat more vegetables and less red meat and fats.  -Has improved since last year. ALT 46 today, otherwise normal (07/21/19).   6. DM -She is on medication for this. -Followed by PCP, I encouraged her monitor her blood sugar at home  7. Vitamin D Deficiency  -Her last menstrual cycle was in 2011-2012.  -previously prescribed Vit D 50,000 units a day for 2 months.   -Her repeated vitamin D level was normal in February 2019. -Due to her mild hypercalcemia, she should not take calcium supplement, and to remain her vitamin D supplement at a low level such as 800 units daily.  -Vit D level still pending today    Plan -She is clinically doing well  -Mammogram/US on 08/07/19  -Lab and f/u with NP Lacie in 11 months  Then yearly    No problem-specific Assessment & Plan notes found for this encounter.   No orders of the defined types were placed in this encounter.  All questions were answered. The patient knows to call the clinic with any problems, questions or concerns. No barriers to learning was  detected.      Truitt Merle, MD 07/21/2019   I, Joslyn Devon, am acting as scribe for Truitt Merle, MD.   I have reviewed the above documentation for accuracy and completeness, and I agree with the above.

## 2019-07-21 ENCOUNTER — Encounter: Payer: Self-pay | Admitting: Hematology

## 2019-07-21 ENCOUNTER — Telehealth: Payer: Self-pay | Admitting: Hematology

## 2019-07-21 ENCOUNTER — Inpatient Hospital Stay (HOSPITAL_BASED_OUTPATIENT_CLINIC_OR_DEPARTMENT_OTHER): Payer: BC Managed Care – PPO | Admitting: Hematology

## 2019-07-21 ENCOUNTER — Other Ambulatory Visit: Payer: Self-pay

## 2019-07-21 ENCOUNTER — Inpatient Hospital Stay: Payer: BC Managed Care – PPO | Attending: Hematology

## 2019-07-21 VITALS — BP 135/81 | HR 78 | Temp 98.3°F | Resp 18 | Ht 67.0 in

## 2019-07-21 DIAGNOSIS — R740 Nonspecific elevation of levels of transaminase and lactic acid dehydrogenase [LDH]: Secondary | ICD-10-CM | POA: Diagnosis not present

## 2019-07-21 DIAGNOSIS — C50212 Malignant neoplasm of upper-inner quadrant of left female breast: Secondary | ICD-10-CM | POA: Diagnosis not present

## 2019-07-21 DIAGNOSIS — Z171 Estrogen receptor negative status [ER-]: Secondary | ICD-10-CM

## 2019-07-21 DIAGNOSIS — H905 Unspecified sensorineural hearing loss: Secondary | ICD-10-CM | POA: Diagnosis not present

## 2019-07-21 DIAGNOSIS — E1142 Type 2 diabetes mellitus with diabetic polyneuropathy: Secondary | ICD-10-CM | POA: Insufficient documentation

## 2019-07-21 DIAGNOSIS — Z79899 Other long term (current) drug therapy: Secondary | ICD-10-CM | POA: Diagnosis not present

## 2019-07-21 DIAGNOSIS — E559 Vitamin D deficiency, unspecified: Secondary | ICD-10-CM | POA: Diagnosis not present

## 2019-07-21 DIAGNOSIS — G629 Polyneuropathy, unspecified: Secondary | ICD-10-CM | POA: Insufficient documentation

## 2019-07-21 DIAGNOSIS — Z853 Personal history of malignant neoplasm of breast: Secondary | ICD-10-CM | POA: Insufficient documentation

## 2019-07-21 LAB — CBC WITH DIFFERENTIAL/PLATELET
Abs Immature Granulocytes: 0.02 10*3/uL (ref 0.00–0.07)
Basophils Absolute: 0 10*3/uL (ref 0.0–0.1)
Basophils Relative: 0 %
Eosinophils Absolute: 0.3 10*3/uL (ref 0.0–0.5)
Eosinophils Relative: 4 %
HCT: 39.7 % (ref 36.0–46.0)
Hemoglobin: 13.4 g/dL (ref 12.0–15.0)
Immature Granulocytes: 0 %
Lymphocytes Relative: 20 %
Lymphs Abs: 1.3 10*3/uL (ref 0.7–4.0)
MCH: 30.5 pg (ref 26.0–34.0)
MCHC: 33.8 g/dL (ref 30.0–36.0)
MCV: 90.4 fL (ref 80.0–100.0)
Monocytes Absolute: 0.4 10*3/uL (ref 0.1–1.0)
Monocytes Relative: 5 %
Neutro Abs: 4.8 10*3/uL (ref 1.7–7.7)
Neutrophils Relative %: 71 %
Platelets: 150 10*3/uL (ref 150–400)
RBC: 4.39 MIL/uL (ref 3.87–5.11)
RDW: 12.8 % (ref 11.5–15.5)
WBC: 6.8 10*3/uL (ref 4.0–10.5)
nRBC: 0 % (ref 0.0–0.2)

## 2019-07-21 LAB — COMPREHENSIVE METABOLIC PANEL
ALT: 46 U/L — ABNORMAL HIGH (ref 0–44)
AST: 29 U/L (ref 15–41)
Albumin: 4.2 g/dL (ref 3.5–5.0)
Alkaline Phosphatase: 121 U/L (ref 38–126)
Anion gap: 9 (ref 5–15)
BUN: 14 mg/dL (ref 6–20)
CO2: 27 mmol/L (ref 22–32)
Calcium: 9.5 mg/dL (ref 8.9–10.3)
Chloride: 107 mmol/L (ref 98–111)
Creatinine, Ser: 0.94 mg/dL (ref 0.44–1.00)
GFR calc Af Amer: >60 mL/min (ref >60)
GFR calc non Af Amer: >60 mL/min (ref >60)
Glucose, Bld: 133 mg/dL — ABNORMAL HIGH (ref 70–99)
Potassium: 3.9 mmol/L (ref 3.5–5.1)
Sodium: 143 mmol/L (ref 135–145)
Total Bilirubin: 0.5 mg/dL (ref 0.3–1.2)
Total Protein: 6.8 g/dL (ref 6.5–8.1)

## 2019-07-21 NOTE — Telephone Encounter (Signed)
Scheduled per 09/11 los, patient received after visit summary and calender.

## 2019-07-22 LAB — VITAMIN D 25 HYDROXY (VIT D DEFICIENCY, FRACTURES): Vit D, 25-Hydroxy: 21.8 ng/mL — ABNORMAL LOW (ref 30.0–100.0)

## 2019-07-24 ENCOUNTER — Telehealth: Payer: Self-pay

## 2019-07-24 NOTE — Telephone Encounter (Signed)
-----   Message from Alla Feeling, NP sent at 07/23/2019  3:08 PM EDT ----- Please let her know Vit D is low again, 21. In lieu of high dose vitamin D like she took before, I would recommend daily supplement 800- 1000 IU daily because she has mild hypercalcemia in the past. Make sure she does not take calcium supplement.  Thanks, Regan Rakers

## 2019-07-24 NOTE — Telephone Encounter (Signed)
Left voice message through Deaf Interpreter Service regarding patient's lab results.  Per Regina Rue NP notified her that her Vitamin D level is low again.  Instructed her to take Vitamin D 1000 units daily and if he is taking a Calcium supplement stop this.  Encouraged her to call back if she has any questions.

## 2019-08-07 ENCOUNTER — Other Ambulatory Visit: Payer: BLUE CROSS/BLUE SHIELD

## 2019-08-10 ENCOUNTER — Other Ambulatory Visit: Payer: Self-pay

## 2019-08-24 ENCOUNTER — Inpatient Hospital Stay: Admission: RE | Admit: 2019-08-24 | Payer: Self-pay | Source: Ambulatory Visit

## 2019-08-24 ENCOUNTER — Inpatient Hospital Stay: Admission: RE | Admit: 2019-08-24 | Payer: Medicare Other | Source: Ambulatory Visit

## 2019-08-24 DIAGNOSIS — G620 Drug-induced polyneuropathy: Secondary | ICD-10-CM | POA: Diagnosis not present

## 2019-08-24 DIAGNOSIS — E11319 Type 2 diabetes mellitus with unspecified diabetic retinopathy without macular edema: Secondary | ICD-10-CM | POA: Diagnosis not present

## 2019-08-24 DIAGNOSIS — I493 Ventricular premature depolarization: Secondary | ICD-10-CM | POA: Diagnosis not present

## 2019-08-24 DIAGNOSIS — C50912 Malignant neoplasm of unspecified site of left female breast: Secondary | ICD-10-CM | POA: Diagnosis not present

## 2019-08-24 DIAGNOSIS — Z1389 Encounter for screening for other disorder: Secondary | ICD-10-CM | POA: Diagnosis not present

## 2019-08-24 DIAGNOSIS — F411 Generalized anxiety disorder: Secondary | ICD-10-CM | POA: Diagnosis not present

## 2019-08-24 DIAGNOSIS — E1169 Type 2 diabetes mellitus with other specified complication: Secondary | ICD-10-CM | POA: Diagnosis not present

## 2019-08-24 DIAGNOSIS — Z135 Encounter for screening for eye and ear disorders: Secondary | ICD-10-CM | POA: Diagnosis not present

## 2019-08-24 DIAGNOSIS — Z23 Encounter for immunization: Secondary | ICD-10-CM | POA: Diagnosis not present

## 2019-09-01 ENCOUNTER — Other Ambulatory Visit: Payer: Self-pay

## 2019-09-08 ENCOUNTER — Ambulatory Visit
Admission: RE | Admit: 2019-09-08 | Discharge: 2019-09-08 | Disposition: A | Discharge status: Home / Self Care | Payer: Medicare Other | Source: Ambulatory Visit | Attending: Hematology | Admitting: Hematology

## 2019-09-08 ENCOUNTER — Other Ambulatory Visit: Payer: Self-pay | Admitting: Hematology

## 2019-09-08 ENCOUNTER — Other Ambulatory Visit: Payer: Self-pay

## 2019-09-08 DIAGNOSIS — N63 Unspecified lump in unspecified breast: Secondary | ICD-10-CM

## 2019-09-11 ENCOUNTER — Other Ambulatory Visit: Payer: Self-pay | Admitting: Hematology

## 2019-09-27 ENCOUNTER — Telehealth: Payer: Self-pay

## 2019-09-27 NOTE — Telephone Encounter (Signed)
[facility], received confirmation fax went through.  Sent to HIM for scan to chart.

## 2019-11-15 ENCOUNTER — Other Ambulatory Visit: Payer: Self-pay | Admitting: Hematology

## 2019-11-15 ENCOUNTER — Telehealth: Payer: Self-pay

## 2019-11-15 NOTE — Telephone Encounter (Signed)
I spoke with Regina Watts and letter her know Dr. Burr Medico wrote her letter and it can be picked up at the front desk of the cancer center.  Regina Watts verbalized understanding.

## 2019-11-15 NOTE — Telephone Encounter (Signed)
vm left from Regina Watts requesting a callback.  She states she needs a note for work but would provide more information. I returned her call and left a vm.  Regina Watts returned my call.  She is requesting a letter for work to reduce her work hours to 20-25 hours per week.  She states that even with the lifting restrictions she is still having the same issues. I told her I would discuss this with Dr. Burr Medico and will let her now the outcome later today. She verbalized understanding.

## 2019-11-17 ENCOUNTER — Telehealth: Payer: Self-pay

## 2019-11-17 NOTE — Telephone Encounter (Signed)
Ms [facility] left Healthcare Provider Request for information forms from [facility] at front desk with note dated 11/17/2019 2:10 pm stating "Please have Truitt Merle fill it out.  I will need it by 4:00 pm today."  I called and spoke with patient. I told her our policy for paper work is it will be completed within 7 - 10 business days.  She verbalized understanding.

## 2019-12-08 ENCOUNTER — Telehealth: Payer: Self-pay

## 2019-12-08 NOTE — Telephone Encounter (Signed)
Regina Watts left vm asking for a new letter for reduced work hours be typed that reduces hours to 20 per week not 20-25 hors.  This was done and put at reception desk for pickup.

## 2020-01-05 ENCOUNTER — Telehealth: Payer: Self-pay

## 2020-01-05 NOTE — Telephone Encounter (Signed)
FMLA papers given to [employer]oz today 01/05/20

## 2020-01-30 ENCOUNTER — Other Ambulatory Visit: Payer: Medicare Other

## 2020-02-16 ENCOUNTER — Other Ambulatory Visit: Payer: Medicare Other

## 2020-02-29 DIAGNOSIS — E782 Mixed hyperlipidemia: Secondary | ICD-10-CM | POA: Diagnosis not present

## 2020-02-29 DIAGNOSIS — Z1389 Encounter for screening for other disorder: Secondary | ICD-10-CM | POA: Diagnosis not present

## 2020-02-29 DIAGNOSIS — F411 Generalized anxiety disorder: Secondary | ICD-10-CM | POA: Diagnosis not present

## 2020-02-29 DIAGNOSIS — Z0000 Encounter for general adult medical examination without abnormal findings: Secondary | ICD-10-CM | POA: Diagnosis not present

## 2020-02-29 DIAGNOSIS — I493 Ventricular premature depolarization: Secondary | ICD-10-CM | POA: Diagnosis not present

## 2020-02-29 DIAGNOSIS — E1169 Type 2 diabetes mellitus with other specified complication: Secondary | ICD-10-CM | POA: Diagnosis not present

## 2020-02-29 DIAGNOSIS — G620 Drug-induced polyneuropathy: Secondary | ICD-10-CM | POA: Diagnosis not present

## 2020-02-29 DIAGNOSIS — Z853 Personal history of malignant neoplasm of breast: Secondary | ICD-10-CM | POA: Diagnosis not present

## 2020-03-06 ENCOUNTER — Other Ambulatory Visit: Payer: Self-pay | Admitting: Hematology

## 2020-03-06 ENCOUNTER — Ambulatory Visit
Admission: RE | Admit: 2020-03-06 | Discharge: 2020-03-06 | Disposition: A | Discharge status: Home / Self Care | Payer: Medicare Other | Source: Ambulatory Visit | Attending: Hematology | Admitting: Hematology

## 2020-03-06 DIAGNOSIS — N63 Unspecified lump in unspecified breast: Secondary | ICD-10-CM

## 2020-03-18 ENCOUNTER — Other Ambulatory Visit: Payer: Medicare Other

## 2020-03-29 DIAGNOSIS — R7989 Other specified abnormal findings of blood chemistry: Secondary | ICD-10-CM | POA: Diagnosis not present

## 2020-03-29 DIAGNOSIS — E1169 Type 2 diabetes mellitus with other specified complication: Secondary | ICD-10-CM | POA: Diagnosis not present

## 2020-03-29 DIAGNOSIS — Z0000 Encounter for general adult medical examination without abnormal findings: Secondary | ICD-10-CM | POA: Diagnosis not present

## 2020-04-01 ENCOUNTER — Ambulatory Visit
Admission: RE | Admit: 2020-04-01 | Discharge: 2020-04-01 | Disposition: A | Discharge status: Home / Self Care | Payer: Medicare Other | Source: Ambulatory Visit | Attending: Hematology | Admitting: Hematology

## 2020-04-01 ENCOUNTER — Other Ambulatory Visit: Payer: Self-pay

## 2020-04-01 ENCOUNTER — Other Ambulatory Visit: Payer: Self-pay | Admitting: Hematology

## 2020-04-01 DIAGNOSIS — N63 Unspecified lump in unspecified breast: Secondary | ICD-10-CM

## 2020-04-01 DIAGNOSIS — N6032 Fibrosclerosis of left breast: Secondary | ICD-10-CM | POA: Diagnosis not present

## 2020-06-18 NOTE — Progress Notes (Deleted)
Regina Watts   Telephone:([phone] Fax:(336) [phone]   Clinic Follow up Note   Patient Care Team: Wenda Low, MD as PCP - General (Internal Medicine) Gordy Levan, MD as Consulting Physician (Oncology) Tyler Pita, MD as Consulting Physician (Radiation Oncology) Alphonsa Overall, MD as Consulting Physician (General Surgery) 06/18/2020  CHIEF COMPLAINT: Follow-up triple negative left breast cancer  SUMMARY OF ONCOLOGIC HISTORY: Oncology History  Breast cancer of upper-inner quadrant of left female breast ([place])  12/2013 Initial Diagnosis   Triple negative malignant neoplasm of breast ([place])   01/01/2014 Pathology Results   Breast, left, needle core biopsy, mass, 10 o'clock - INVASIVE DUCTAL CARCINOMA.   01/30/2014 Genetic Testing   PMS2 c.1309C>T, ATM c.8674G>A and ATM [phone] VUS's found on the Breast/Ovarian cancer panel.  The ATM [phone] VUS was identified as a mosaic result. The Breast/Ovarian gene panel offered by [facility] includes sequencing and rearrangement analysis for the following 21 genes:  ATM, BARD1, BRCA1, BRCA2, BRIP1, CDH1, CHEK2, EPCAM, FANCC, MLH1, MSH2, MSH6, NBN, PALB2, PMS2, PTEN, RAD51C, RAD51D, STK11, TP53, and XRCC2.     UPDATE: PMS2 c.1309C>T VUS has been reclassified from a variant of uncertain significance to a likely benign variant based on a combination of sources, e.g., internal data, published literature, population databases and in [place].  The updated report date is Apr 08, 2017.    03/06/2014 Surgery   S/p Lumpectomy by Dr. Lucia Gaskins at [facility].    03/06/2014 Pathology Results   1. Lymph node, sentinel, biopsy, Left axillary - ONE BENIGN LYMPH NODE WITH NO TUMOR SEEN (0/1). 2. Breast, lumpectomy, Left - INVASIVE GRADE II DUCTAL CARCINOMA SPANNING 1.8 CM IN GREATEST DIMENSION. - INVASIVE TUMOR IS EXTREMELY CLOSE (0.1 CM) TO POSTERIOR MARGIN ON INITIAL LUMPECTOMY SPECIMEN, PLEASE SEE SPECIMEN # 3 FOR  FINAL MARGIN STATUS. - OTHER MARGINS ARE NEGATIVE. - SEE ONCOLOGY TEMPLATE. 3. Breast, excision, Left posterior margin - BENIGN BREAST PARENCHYMA. - NO TUMOR SEEN.   03/06/2014 Receptors her2   Estrogen receptor: negative. Progesterone receptor: negative. HER-2: negative   03/30/2014 - 04/14/2014 Chemotherapy   The patient had adjuvant chemotherapy treatment, Cytoxan and Adriamycin for 4 cycles then Taxol for 12 weeks    10/22/2014 - 12/11/2014 Radiation Therapy    The patient saw Dr. Tammi Klippel for radiation treatment. This is the current list of radiation treatment:  1.  The whole left breast was treated to 50.4 Gy in 28 fractions of 1.8 Gy 2.  The lumpectomy surgical site was treated to a total dose of 60.4 Gy with 5 additional fractions of 2 Gy   09/07/2017 Surgery   LEFT BREAST MASTOPEXY by Dr. Iran Planas   01/20/2018 Mammogram   IMPRESSION: No evidence of malignancy bilaterally. Bilateral lumpectomy changes     CURRENT THERAPY: Surveillance  INTERVAL HISTORY: [name] returns for follow-up as scheduled.  She was last seen in clinic on 07/21/2019.  Mammogram on 03/06/2020 showed indeterminate mass in the left breast at 1:00 1 cmfn and a probable and stable benign mass at the 12 o'clock position of the left breast 1 cmfn.  Ultrasound showed a superficial irregular hypoechoic mass in the left breast at 1:00 measuring 1.9 x 0.8 x 0.9 cm and a stable hypoechoic mass in the left breast at 12:00 measuring 5 x 3 x 6 mm, measured 5 x 4 x 7 mm on 09/08/2019.  Biopsy on 04/01/2020 of the lesion at the 1 o'clock position showed fat necrosis with associated fibrosis, negative for carcinoma.  Today, she presents   REVIEW OF SYSTEMS:   Constitutional: Denies fevers, chills or abnormal weight loss Eyes: Denies blurriness of vision Ears, nose, mouth, throat, and face: Denies mucositis or sore throat Respiratory: Denies cough, dyspnea or wheezes Cardiovascular: Denies palpitation, chest  discomfort or lower extremity swelling Gastrointestinal:  Denies nausea, heartburn or change in bowel habits Skin: Denies abnormal skin rashes Lymphatics: Denies new lymphadenopathy or easy bruising Neurological:Denies numbness, tingling or new weaknesses Behavioral/Psych: Mood is stable, no new changes  All other systems were reviewed with the patient and are negative.  MEDICAL HISTORY:  Past Medical History:  Diagnosis Date  . Breast cancer ([place]) JANUARY 2002   RIGHT BREAST T4 SENTINEL NODE NEGATIVE, HER2 3+, ER/PR NEGATIVE  . Breast cancer [facility]) February 2015   Left Breast T1N0  . Congenital deafness   . Hx antineoplastic chemo   . Hx of radiation therapy   . Hyperlipidemia   . Personal history of radiation therapy   . Wears glasses    to drive    SURGICAL HISTORY: Past Surgical History:  Procedure Laterality Date  . AUGMENTATION MAMMAPLASTY Left    breast lift   . BREAST LUMPECTOMY Left 03/06/2014  . BREAST LUMPECTOMY Right 11/2000  . BREAST LUMPECTOMY WITH NEEDLE LOCALIZATION AND AXILLARY SENTINEL LYMPH NODE BX Left 03/06/2014   Procedure: BREAST LUMPECTOMY WITH NEEDLE LOCALIZATION AND AXILLARY SENTINEL LYMPH NODE BIOPSY;  Surgeon: Shann Medal, MD;  Location: [place];  Service: General;  Laterality: Left;  . BREAST SURGERY  2002   lumpectomy on right side  . CESAREAN SECTION    . MASTOPEXY Left 09/07/2017   Procedure: LEFT BREAST MASTOPEXY;  Surgeon: Irene Limbo, MD;  Location: [place];  Service: Plastics;  Laterality: Left;  . PORT-A-CATH REMOVAL Right 08/03/2014   Procedure: REMOVAL PORT-A-CATH;  Surgeon: Jackolyn Confer, MD;  [facility];  Service: General;  Laterality: Right;  . PORTACATH PLACEMENT Right 03/06/2014   Procedure: INSERTION PORT-A-CATH;  Surgeon: Shann Medal, MD;  Location: [place];  Service: General;  Laterality: Right;  subclavian  . TONSILLECTOMY      I have reviewed the social  history and family history with the patient and they are unchanged from previous note.  ALLERGIES:  is allergic to taxotere [docetaxel].  MEDICATIONS:  Current Outpatient Medications  Medication Sig Dispense Refill  . atorvastatin (LIPITOR) 40 MG tablet Take 40 mg by mouth daily.  3  . metFORMIN (GLUCOPHAGE) 500 MG tablet TAKE 1 TABLET BY MOUTH EVERY DAY WITH MEALS  6  . pyridOXINE (VITAMIN B-6) 50 MG tablet Take 1 tablet (50 mg total) by mouth 2 (two) times daily. 60 tablet 5  . sertraline (ZOLOFT) 100 MG tablet Take 1 tablet (100 mg total) by mouth daily. 30 tablet 3   No current facility-administered medications for this visit.    PHYSICAL EXAMINATION: ECOG PERFORMANCE STATUS: {CHL ONC ECOG PS:[id number]}  There were no vitals filed for this visit. There were no vitals filed for this visit.  GENERAL:alert, no distress and comfortable SKIN: skin color, texture, turgor are normal, no rashes or significant lesions EYES: normal, Conjunctiva are pink and non-injected, sclera clear OROPHARYNX:no exudate, no erythema and lips, buccal mucosa, and tongue normal  NECK: supple, thyroid normal size, non-tender, without nodularity LYMPH:  no palpable lymphadenopathy in the cervical, axillary or inguinal LUNGS: clear to auscultation and percussion with normal breathing effort HEART: regular rate & rhythm and no murmurs and no  lower extremity edema ABDOMEN:abdomen soft, non-tender and normal bowel sounds Musculoskeletal:no cyanosis of digits and no clubbing  NEURO: alert & oriented x 3 with fluent speech, no focal motor/sensory deficits  LABORATORY DATA:  I have reviewed the data as listed CBC Latest Ref Rng & Units 07/21/2019 07/06/2018 01/06/2018  WBC 4.0 - 10.5 K/uL 6.8 4.9 4.9  Hemoglobin 12.0 - 15.0 g/dL 13.4 14.2 14.4  Hematocrit 36 - 46 % 39.7 42.6 42.3  Platelets 150 - 400 K/uL 150 160 147     CMP Latest Ref Rng & Units 07/21/2019 07/06/2018 01/06/2018  Glucose 70 - 99 mg/dL 133(H)  168(H) 201(H)  BUN 6 - 20 mg/dL '14 15 13  ' Creatinine 0.44 - 1.00 mg/dL 0.94 0.92 0.88  Sodium 135 - 145 mmol/L 143 141 143  Potassium 3.5 - 5.1 mmol/L 3.9 4.4 4.4  Chloride 98 - 111 mmol/L 107 104 105  CO2 22 - 32 mmol/L '27 29 29  ' Calcium 8.9 - 10.3 mg/dL 9.5 10.7(H) 9.7  Total Protein 6.5 - 8.1 g/dL 6.8 7.3 6.9  Total Bilirubin 0.3 - 1.2 mg/dL 0.5 0.5 0.7  Alkaline Phos 38 - 126 U/L 121 111 118  AST 15 - 41 U/L 29 50(H) 46(H)  ALT 0 - 44 U/L 46(H) 79(H) 82(H)      RADIOGRAPHIC STUDIES: I have personally reviewed the radiological images as listed and agreed with the findings in the report. No results found.   ASSESSMENT & PLAN:  No problem-specific Assessment & Plan notes found for this encounter.   No orders of the defined types were placed in this encounter.  All questions were answered. The patient knows to call the clinic with any problems, questions or concerns. No barriers to learning was detected. I spent {CHL ONC TIME VISIT - GYYOC:[id number]} counseling the patient face to face. The total time spent in the appointment was {CHL ONC TIME VISIT - OLNTJ:5027142320} and more than 50% was on counseling and review of test results     Alla Feeling, NP 06/18/20

## 2020-06-19 ENCOUNTER — Telehealth: Payer: Self-pay

## 2020-06-19 ENCOUNTER — Inpatient Hospital Stay: Payer: Medicare Other

## 2020-06-19 ENCOUNTER — Inpatient Hospital Stay: Payer: Medicare Other | Admitting: Nurse Practitioner

## 2020-06-19 NOTE — Telephone Encounter (Signed)
Called to inquire about todays appt no answer messages left with both her and husbands machine awaiting call back scheduling message sent

## 2020-06-20 ENCOUNTER — Telehealth: Payer: Self-pay | Admitting: Nurse Practitioner

## 2020-06-20 NOTE — Telephone Encounter (Signed)
Rescheduled appointments per 8/11 scheduling message. Called Interpreter line and left a message for patient with appointments. Booked sign language interpreter for appointments as well, they are aware of appointments date and times also. Will mail updated calendar to patient with appointment date and times.

## 2020-06-30 NOTE — Progress Notes (Signed)
[place]   Telephone:(336) [phone] Fax:(336) [phone]   Clinic Follow up Note   Patient Care Team: Wenda Low, MD as PCP - General (Internal Medicine) Gordy Levan, MD as Consulting Physician (Oncology) Tyler Pita, MD as Consulting Physician (Radiation Oncology) Alphonsa Overall, MD as Consulting Physician (General Surgery) 07/01/2020  CHIEF COMPLAINT: F/u triple negative left breast cancer   SUMMARY OF ONCOLOGIC HISTORY: Oncology History  Breast cancer of upper-inner quadrant of left female breast ([place])  12/2013 Initial Diagnosis   Triple negative malignant neoplasm of breast ([place])   01/01/2014 Pathology Results   Breast, left, needle core biopsy, mass, 10 o'clock - INVASIVE DUCTAL CARCINOMA.   01/30/2014 Genetic Testing   PMS2 c.1309C>T, ATM c.8674G>A and ATM [phone] VUS's found on the Breast/Ovarian cancer panel.  The ATM [phone] VUS was identified as a mosaic result. The Breast/Ovarian gene panel offered by [facility] includes sequencing and rearrangement analysis for the following 21 genes:  ATM, BARD1, BRCA1, BRCA2, BRIP1, CDH1, CHEK2, EPCAM, FANCC, MLH1, MSH2, MSH6, NBN, PALB2, PMS2, PTEN, RAD51C, RAD51D, STK11, TP53, and XRCC2.     UPDATE: PMS2 c.1309C>T VUS has been reclassified from a variant of uncertain significance to a likely benign variant based on a combination of sources, e.g., internal data, published literature, population databases and in [place].  The updated report date is Apr 08, 2017.    03/06/2014 Surgery   S/p Lumpectomy by Dr. Lucia Gaskins at [facility].    03/06/2014 Pathology Results   1. Lymph node, sentinel, biopsy, Left axillary - ONE BENIGN LYMPH NODE WITH NO TUMOR SEEN (0/1). 2. Breast, lumpectomy, Left - INVASIVE GRADE II DUCTAL CARCINOMA SPANNING 1.8 CM IN GREATEST DIMENSION. - INVASIVE TUMOR IS EXTREMELY CLOSE (0.1 CM) TO POSTERIOR MARGIN ON INITIAL LUMPECTOMY SPECIMEN, PLEASE SEE SPECIMEN # 3 FOR FINAL  MARGIN STATUS. - OTHER MARGINS ARE NEGATIVE. - SEE ONCOLOGY TEMPLATE. 3. Breast, excision, Left posterior margin - BENIGN BREAST PARENCHYMA. - NO TUMOR SEEN.   03/06/2014 Receptors her2   Estrogen receptor: negative. Progesterone receptor: negative. HER-2: negative   03/30/2014 - 04/14/2014 Chemotherapy   The patient had adjuvant chemotherapy treatment, Cytoxan and Adriamycin for 4 cycles then Taxol for 12 weeks    10/22/2014 - 12/11/2014 Radiation Therapy    The patient saw Dr. Tammi Klippel for radiation treatment. This is the current list of radiation treatment:  1.  The whole left breast was treated to 50.4 Gy in 28 fractions of 1.8 Gy 2.  The lumpectomy surgical site was treated to a total dose of 60.4 Gy with 5 additional fractions of 2 Gy   09/07/2017 Surgery   LEFT BREAST MASTOPEXY by Dr. Iran Planas   01/20/2018 Mammogram   IMPRESSION: No evidence of malignancy bilaterally. Bilateral lumpectomy changes   04/01/2020 Pathology Results   Breast, left, needle core biopsy, upper outer quadrant, 1 o'clock, 1cm from nipple - FAT NECROSIS WITH ASSOCIATED FIBROSIS. SEE NOTE - NEGATIVE FOR CARCINOMA     CURRENT THERAPY: Surveillance   INTERVAL HISTORY: Regina Watts returns for f/u as scheduled. She was last seen by Dr. Burr Medico on 07/21/19. Annual mammogram on 03/06/20 showed indeterminate mass in the left breast 1 oclock position and a probable stable benign mass in the 12 oclock position. US showed a superficial irregular hypoechoic mass in the left breast 1 oclock position 1 cmfn measuring 1.9 x0.8 x0.9 cm. The mass in the 12 oclock position was stable, measuring 5x3x6 mm as before. No axillary adenopathy. Biopsy on 04/01/20 showed fat  necrosis with fibrosis. She was recommended to have repeat mammogram in 6 months to follow the mass in 12 oclock position.  Today she presents with sign language interpreter.  She feels well, no changes in her health since last year.  Denies new concerns or changes in  her breasts.  She has mild intermittent numbness in her fingertips if she does not activity for a long time.  She is more sensitive to hot temperatures. No pain.  Denies new bone pain, abdominal bloating or pain, changes in bowel habits, GI/GYN bleeding, fever, chills, cough, chest pain, dyspnea.  She is up-to-date on other age-appropriate cancer screenings.  She would like to increase her hours and reduce her weight restrictions at [employer].   MEDICAL HISTORY:  Past Medical History:  Diagnosis Date   Breast cancer ([place]) JANUARY 2002   RIGHT BREAST T4 SENTINEL NODE NEGATIVE, HER2 3+, ER/PR NEGATIVE   Breast cancer ([place]) February 2015   Left Breast T1N0   Congenital deafness    Hx antineoplastic chemo    Hx of radiation therapy    Hyperlipidemia    Personal history of radiation therapy    Wears glasses    to drive    SURGICAL HISTORY: Past Surgical History:  Procedure Laterality Date   AUGMENTATION MAMMAPLASTY Left    breast lift    BREAST LUMPECTOMY Left 03/06/2014   BREAST LUMPECTOMY Right 11/2000   BREAST LUMPECTOMY WITH NEEDLE LOCALIZATION AND AXILLARY SENTINEL LYMPH NODE BX Left 03/06/2014   Procedure: BREAST LUMPECTOMY WITH NEEDLE LOCALIZATION AND AXILLARY SENTINEL LYMPH NODE BIOPSY;  Surgeon: Shann Medal, MD;  Location: [place];  Service: General;  Laterality: Left;   BREAST SURGERY  2002   lumpectomy on right side   CESAREAN SECTION     MASTOPEXY Left 09/07/2017   Procedure: LEFT BREAST MASTOPEXY;  Surgeon: Irene Limbo, MD;  Location: [place];  Service: Plastics;  Laterality: Left;   PORT-A-CATH REMOVAL Right 08/03/2014   Procedure: REMOVAL PORT-A-CATH;  Surgeon: Jackolyn Confer, MD;  [facility];  Service: General;  Laterality: Right;   PORTACATH PLACEMENT Right 03/06/2014   Procedure: INSERTION PORT-A-CATH;  Surgeon: Shann Medal, MD;  Location: [place];  Service: General;  Laterality:  Right;  subclavian   TONSILLECTOMY      I have reviewed the social history and family history with the patient and they are unchanged from previous note.  ALLERGIES:  is allergic to taxotere [docetaxel].  MEDICATIONS:  Current Outpatient Medications  Medication Sig Dispense Refill   atorvastatin (LIPITOR) 40 MG tablet Take 40 mg by mouth daily.  3   metFORMIN (GLUCOPHAGE) 500 MG tablet TAKE 1 TABLET BY MOUTH EVERY DAY WITH MEALS  6   pyridOXINE (VITAMIN B-6) 50 MG tablet Take 1 tablet (50 mg total) by mouth 2 (two) times daily. 60 tablet 5   sertraline (ZOLOFT) 100 MG tablet Take 1 tablet (100 mg total) by mouth daily. 30 tablet 3   No current facility-administered medications for this visit.    PHYSICAL EXAMINATION: ECOG PERFORMANCE STATUS: 0 - Asymptomatic  Vitals:   07/01/20 1137  BP: (!) 143/76  Pulse: 78  Resp: 18  Temp: 97.9 F (36.6 C)  SpO2: 95%   Filed Weights   07/01/20 1137  Weight: 170 lb 8 oz (77.3 kg)    GENERAL:alert, no distress and comfortable SKIN: No rash to exposed skin EYES: sclera clear NECK: Without mass supple LYMPH:  no palpable cervical, supraclavicular,  or axillary lymphadenopathy  LUNGS: Normal breathing effort HEART:  no lower extremity edema Musculoskeletal: Full range of motion to upper extremities NEURO: alert & oriented x 3  Breast exam: S/p bilateral lumpectomies, surgical incisions completely healed.  Moderate scar tissue to both breasts without palpable mass.  No bilateral adenopathy.   LABORATORY DATA:  I have reviewed the data as listed CBC Latest Ref Rng & Units 07/01/2020 07/21/2019 07/06/2018  WBC 4.0 - 10.5 K/uL 4.9 6.8 4.9  Hemoglobin 12.0 - 15.0 g/dL 14.5 13.4 14.2  Hematocrit 36 - 46 % 41.8 39.7 42.6  Platelets 150 - 400 K/uL 150 150 160     CMP Latest Ref Rng & Units 07/01/2020 07/21/2019 07/06/2018  Glucose 70 - 99 mg/dL 176(H) 133(H) 168(H)  BUN 6 - 20 mg/dL '16 14 15  ' Creatinine 0.44 - 1.00 mg/dL 0.89 0.94  0.92  Sodium 135 - 145 mmol/L 141 143 141  Potassium 3.5 - 5.1 mmol/L 4.4 3.9 4.4  Chloride 98 - 111 mmol/L 107 107 104  CO2 22 - 32 mmol/L '26 27 29  ' Calcium 8.9 - 10.3 mg/dL 10.2 9.5 10.7(H)  Total Protein 6.5 - 8.1 g/dL 7.2 6.8 7.3  Total Bilirubin 0.3 - 1.2 mg/dL 0.9 0.5 0.5  Alkaline Phos 38 - 126 U/L 118 121 111  AST 15 - 41 U/L 31 29 50(H)  ALT 0 - 44 U/L 53(H) 46(H) 79(H)      RADIOGRAPHIC STUDIES: I have personally reviewed the radiological images as listed and agreed with the findings in the report. No results found.   ASSESSMENT & PLAN: Regina Watts is a 59 y.o. female with   1.Triple negative left breast cancer T1cN0 diagnosed 12-2013, history of stage II right breast cancer HR-/HER2+ in 2002 -She has history of T2N0M0 right breast cancer 11-2000 at age 80 treated with right lumpectomy, 2 sentinel nodes and chemotherapy -She had a history of triple negative breast cancer in 2015, stage IA, treated with left lumpectomy and sentinel node evaluation 03/06/14. S/p Adjuvant chemotherapy completed in 09/10/2014 and adjuvant RT completed 12/11/14 -Her 01/2019 Mammogram/US showed likely benign cyst or oil cyst in upper left breast. No evidence of malignancy in right breast.  -her 02/2020 mammogram showed indeterminate mass in the left breast 1 oclock position and a probable stable benign mass in the 12 oclock position. US showed a superficial irregular hypoechoic mass in the left breast 1 oclock position 1 cmfn measuring 1.9 x0.8 x0.9 cm. The mass in the 12 oclock position was stable, measuring 5x3x6 mm as before. No axillary adenopathy.  -Biopsy on 04/01/20 showed fat necrosis with fibrosis. She was recommended to have repeat mammogram in 6 months to follow the mass in 12 oclock position. -continue surveillance   2. Congenital deafness -Communicates by reading/writing and sign language.   3. Genetics -multiple genetic variants of unknown significance on genetic testing 01-2014   4.  Peripheral neuropathy  -related to prior taxanes -resolved in feet, mild and intermittent in fingertips. Functions well. -declined medication -monitoring   5. Hepatic steatosisand mild transaminitis -Her prior CT abdomen and liver MRI in 2015 showed hepatic stenosisand a 1.2cmtriangular subcapsular medial segment of left hepatic lobe area of enhancement, which is unchanged since 2008,likely represent perfusion phenomena. -She has some mild intermittent transaminitis since 2015, likely segments to hepatic steatosis,overall stable -stable, continue monitoring   6. DM -on metformin, f/u PCP   7. Vitamin D Deficiency  -intermittently low -level was 21.8 on 07/21/19, level is  pending today   Disposition:  Regina Watts is clinically doing well.  Breast exam is unremarkable, CBC is normal, CMP with mild ALT elevation is stable.  Bilateral mammogram in 02/2020, left breast ultrasound, and biopsy in 03/2020 are negative for malignancy.  Overall no clinical concern for recurrence. Continue surveillance.   She will have a follow-up left breast ultrasound to monitor the area of fat necrosis and likely benign lesion in the 12:00 location in 09/2020.  She will repeat her annual bilateral mammogram in 02/2021.  I encouraged her to begin self breast exams to familiarize herself with her own breast tissue.  I recommend healthy lifestyle choices including physical exercise, healthy diet, avoiding smoking and limiting alcohol.  She remains up-to-date on other age-appropriate cancer screenings.  We reviewed signs and symptoms of cancer recurrence/metastasis.  She will return for lab and follow-up in 1 year.  She knows to call clinic sooner if she develops any concerning changes in the interim.   Orders Placed This Encounter  Procedures   US BREAST LTD UNI LEFT INC AXILLA    Standing Status:   Future    Standing Expiration Date:   07/01/2021    Order Specific Question:   Reason for Exam (SYMPTOM  OR  DIAGNOSIS REQUIRED)    Answer:   f/u 6 month [place] for mass in 12 o'clock position and fat necrosis in 1 o'clock position; h/o bilateral breast cancer    Order Specific Question:   Preferred imaging location?    Answer:   [facility]   MM DIAG BREAST TOMO BILATERAL    Standing Status:   Future    Standing Expiration Date:   07/01/2021    Order Specific Question:   Reason for Exam (SYMPTOM  OR DIAGNOSIS REQUIRED)    Answer:   h/o bilateral breast cancer    Order Specific Question:   Is the patient pregnant?    Answer:   Yes    Order Specific Question:   Preferred imaging location?    Answer:   [facility]   All questions were answered. The patient knows to call the clinic with any problems, questions or concerns. No barriers to learning were detected. Total encounter time was 30 minutes.      Alla Feeling, NP 07/01/20

## 2020-07-01 ENCOUNTER — Other Ambulatory Visit: Payer: Self-pay

## 2020-07-01 ENCOUNTER — Encounter: Payer: Self-pay | Admitting: Nurse Practitioner

## 2020-07-01 ENCOUNTER — Telehealth: Payer: Self-pay | Admitting: Nurse Practitioner

## 2020-07-01 ENCOUNTER — Inpatient Hospital Stay (HOSPITAL_BASED_OUTPATIENT_CLINIC_OR_DEPARTMENT_OTHER): Payer: BC Managed Care – PPO | Admitting: Nurse Practitioner

## 2020-07-01 ENCOUNTER — Inpatient Hospital Stay: Payer: BC Managed Care – PPO | Attending: Nurse Practitioner

## 2020-07-01 VITALS — BP 143/76 | HR 78 | Temp 97.9°F | Resp 18 | Ht 67.0 in | Wt 170.5 lb

## 2020-07-01 DIAGNOSIS — Z7984 Long term (current) use of oral hypoglycemic drugs: Secondary | ICD-10-CM | POA: Insufficient documentation

## 2020-07-01 DIAGNOSIS — H905 Unspecified sensorineural hearing loss: Secondary | ICD-10-CM | POA: Insufficient documentation

## 2020-07-01 DIAGNOSIS — Z171 Estrogen receptor negative status [ER-]: Secondary | ICD-10-CM | POA: Diagnosis not present

## 2020-07-01 DIAGNOSIS — C50212 Malignant neoplasm of upper-inner quadrant of left female breast: Secondary | ICD-10-CM | POA: Diagnosis not present

## 2020-07-01 DIAGNOSIS — Z853 Personal history of malignant neoplasm of breast: Secondary | ICD-10-CM | POA: Insufficient documentation

## 2020-07-01 DIAGNOSIS — E559 Vitamin D deficiency, unspecified: Secondary | ICD-10-CM | POA: Insufficient documentation

## 2020-07-01 DIAGNOSIS — Z9221 Personal history of antineoplastic chemotherapy: Secondary | ICD-10-CM | POA: Insufficient documentation

## 2020-07-01 DIAGNOSIS — Z79899 Other long term (current) drug therapy: Secondary | ICD-10-CM | POA: Diagnosis not present

## 2020-07-01 DIAGNOSIS — E119 Type 2 diabetes mellitus without complications: Secondary | ICD-10-CM | POA: Diagnosis not present

## 2020-07-01 DIAGNOSIS — Z923 Personal history of irradiation: Secondary | ICD-10-CM | POA: Diagnosis not present

## 2020-07-01 LAB — CBC WITH DIFFERENTIAL/PLATELET
Abs Immature Granulocytes: 0.02 10*3/uL (ref 0.00–0.07)
Basophils Absolute: 0 10*3/uL (ref 0.0–0.1)
Basophils Relative: 1 %
Eosinophils Absolute: 0.2 10*3/uL (ref 0.0–0.5)
Eosinophils Relative: 3 %
HCT: 41.8 % (ref 36.0–46.0)
Hemoglobin: 14.5 g/dL (ref 12.0–15.0)
Immature Granulocytes: 0 %
Lymphocytes Relative: 28 %
Lymphs Abs: 1.4 10*3/uL (ref 0.7–4.0)
MCH: 31.3 pg (ref 26.0–34.0)
MCHC: 34.7 g/dL (ref 30.0–36.0)
MCV: 90.1 fL (ref 80.0–100.0)
Monocytes Absolute: 0.3 10*3/uL (ref 0.1–1.0)
Monocytes Relative: 7 %
Neutro Abs: 3 10*3/uL (ref 1.7–7.7)
Neutrophils Relative %: 61 %
Platelets: 150 10*3/uL (ref 150–400)
RBC: 4.64 MIL/uL (ref 3.87–5.11)
RDW: 12.5 % (ref 11.5–15.5)
WBC: 4.9 10*3/uL (ref 4.0–10.5)
nRBC: 0 % (ref 0.0–0.2)

## 2020-07-01 LAB — COMPREHENSIVE METABOLIC PANEL
ALT: 53 U/L — ABNORMAL HIGH (ref 0–44)
AST: 31 U/L (ref 15–41)
Albumin: 4.1 g/dL (ref 3.5–5.0)
Alkaline Phosphatase: 118 U/L (ref 38–126)
Anion gap: 8 (ref 5–15)
BUN: 16 mg/dL (ref 6–20)
CO2: 26 mmol/L (ref 22–32)
Calcium: 10.2 mg/dL (ref 8.9–10.3)
Chloride: 107 mmol/L (ref 98–111)
Creatinine, Ser: 0.89 mg/dL (ref 0.44–1.00)
GFR calc Af Amer: >60 mL/min (ref >60)
GFR calc non Af Amer: >60 mL/min (ref >60)
Glucose, Bld: 176 mg/dL — ABNORMAL HIGH (ref 70–99)
Potassium: 4.4 mmol/L (ref 3.5–5.1)
Sodium: 141 mmol/L (ref 135–145)
Total Bilirubin: 0.9 mg/dL (ref 0.3–1.2)
Total Protein: 7.2 g/dL (ref 6.5–8.1)

## 2020-07-01 LAB — VITAMIN D 25 HYDROXY (VIT D DEFICIENCY, FRACTURES): Vit D, 25-Hydroxy: 30.69 ng/mL (ref 30–100)

## 2020-07-01 NOTE — Telephone Encounter (Signed)
Scheduled per los. Gave avs and calendar  

## 2020-08-29 DIAGNOSIS — E119 Type 2 diabetes mellitus without complications: Secondary | ICD-10-CM | POA: Diagnosis not present

## 2020-08-29 DIAGNOSIS — E782 Mixed hyperlipidemia: Secondary | ICD-10-CM | POA: Diagnosis not present

## 2020-08-29 DIAGNOSIS — F411 Generalized anxiety disorder: Secondary | ICD-10-CM | POA: Diagnosis not present

## 2020-08-29 DIAGNOSIS — Z853 Personal history of malignant neoplasm of breast: Secondary | ICD-10-CM | POA: Diagnosis not present

## 2020-08-29 DIAGNOSIS — E1169 Type 2 diabetes mellitus with other specified complication: Secondary | ICD-10-CM | POA: Diagnosis not present

## 2020-08-29 DIAGNOSIS — Z7984 Long term (current) use of oral hypoglycemic drugs: Secondary | ICD-10-CM | POA: Diagnosis not present

## 2020-08-29 DIAGNOSIS — Z23 Encounter for immunization: Secondary | ICD-10-CM | POA: Diagnosis not present

## 2020-10-08 ENCOUNTER — Ambulatory Visit: Payer: Medicare Other

## 2020-10-08 ENCOUNTER — Ambulatory Visit
Admission: RE | Admit: 2020-10-08 | Discharge: 2020-10-08 | Disposition: A | Discharge status: Home / Self Care | Payer: Medicare Other | Source: Ambulatory Visit | Attending: Nurse Practitioner | Admitting: Nurse Practitioner

## 2020-10-08 DIAGNOSIS — Z171 Estrogen receptor negative status [ER-]: Secondary | ICD-10-CM

## 2020-10-08 DIAGNOSIS — C50212 Malignant neoplasm of upper-inner quadrant of left female breast: Secondary | ICD-10-CM

## 2020-10-22 DIAGNOSIS — Z6825 Body mass index (BMI) 25.0-25.9, adult: Secondary | ICD-10-CM | POA: Diagnosis not present

## 2020-10-22 DIAGNOSIS — Z01419 Encounter for gynecological examination (general) (routine) without abnormal findings: Secondary | ICD-10-CM | POA: Diagnosis not present

## 2020-11-22 ENCOUNTER — Other Ambulatory Visit: Payer: Self-pay | Admitting: Physician Assistant

## 2020-11-22 ENCOUNTER — Ambulatory Visit
Admission: RE | Admit: 2020-11-22 | Discharge: 2020-11-22 | Disposition: A | Discharge status: Home / Self Care | Payer: Medicare Other | Source: Ambulatory Visit | Attending: Physician Assistant | Admitting: Physician Assistant

## 2020-11-22 DIAGNOSIS — M19011 Primary osteoarthritis, right shoulder: Secondary | ICD-10-CM | POA: Diagnosis not present

## 2020-11-22 DIAGNOSIS — M25511 Pain in right shoulder: Secondary | ICD-10-CM

## 2020-12-06 DIAGNOSIS — M67911 Unspecified disorder of synovium and tendon, right shoulder: Secondary | ICD-10-CM | POA: Diagnosis not present

## 2020-12-18 DIAGNOSIS — Z13820 Encounter for screening for osteoporosis: Secondary | ICD-10-CM | POA: Diagnosis not present

## 2021-01-02 ENCOUNTER — Telehealth: Payer: Self-pay | Admitting: Nurse Practitioner

## 2021-01-02 NOTE — Telephone Encounter (Signed)
Moved upcoming appointment due to provider's template. Mailed calendar.

## 2021-01-08 DIAGNOSIS — M67911 Unspecified disorder of synovium and tendon, right shoulder: Secondary | ICD-10-CM | POA: Diagnosis not present

## 2021-01-22 DIAGNOSIS — M75121 Complete rotator cuff tear or rupture of right shoulder, not specified as traumatic: Secondary | ICD-10-CM | POA: Diagnosis not present

## 2021-02-04 DIAGNOSIS — M7541 Impingement syndrome of right shoulder: Secondary | ICD-10-CM | POA: Diagnosis not present

## 2021-02-04 DIAGNOSIS — M75121 Complete rotator cuff tear or rupture of right shoulder, not specified as traumatic: Secondary | ICD-10-CM | POA: Diagnosis not present

## 2021-02-04 HISTORY — PX: ROTATOR CUFF REPAIR: SHX139

## 2021-02-11 ENCOUNTER — Other Ambulatory Visit: Payer: BC Managed Care – PPO

## 2021-02-17 ENCOUNTER — Encounter: Payer: Self-pay | Admitting: Physical Therapy

## 2021-02-17 ENCOUNTER — Ambulatory Visit: Payer: BC Managed Care – PPO | Attending: Orthopedic Surgery | Admitting: Physical Therapy

## 2021-02-17 ENCOUNTER — Other Ambulatory Visit: Payer: Self-pay

## 2021-02-17 DIAGNOSIS — G8929 Other chronic pain: Secondary | ICD-10-CM | POA: Diagnosis present

## 2021-02-17 DIAGNOSIS — R293 Abnormal posture: Secondary | ICD-10-CM | POA: Diagnosis present

## 2021-02-17 DIAGNOSIS — M25611 Stiffness of right shoulder, not elsewhere classified: Secondary | ICD-10-CM | POA: Insufficient documentation

## 2021-02-17 DIAGNOSIS — M25511 Pain in right shoulder: Secondary | ICD-10-CM | POA: Insufficient documentation

## 2021-02-17 DIAGNOSIS — M6281 Muscle weakness (generalized): Secondary | ICD-10-CM | POA: Insufficient documentation

## 2021-02-17 DIAGNOSIS — R600 Localized edema: Secondary | ICD-10-CM | POA: Diagnosis present

## 2021-02-17 NOTE — Therapy (Signed)
[place], [place], [address] Phone: [phone]   Fax:  [phone]  Physical Therapy Evaluation  Patient Details  Name: Regina Watts MRN: [id number] Date of Birth: 1961/10/02 Referring Provider (PT): Tania Ade   Encounter Date: 02/17/2021   PT End of Session - 02/17/21 1414    Visit Number 1    Number of Visits 17    Date for PT Re-Evaluation 05/12/21    Authorization Type BCBS FOTO 6th and 10th visit.    Progress Note Due on Visit 10    PT Start Time 1336    PT Stop Time 1415    PT Time Calculation (min) 39 min    Activity Tolerance Patient limited by pain;Patient tolerated treatment well    Behavior During Therapy [facility] for tasks assessed/performed           Past Medical History:  Diagnosis Date  . Breast cancer ([place]) JANUARY 2002   RIGHT BREAST T4 SENTINEL NODE NEGATIVE, HER2 3+, ER/PR NEGATIVE  . Breast cancer [facility]) February 2015   Left Breast T1N0  . Congenital deafness   . Hx antineoplastic chemo   . Hx of radiation therapy   . Hyperlipidemia   . Personal history of radiation therapy   . Wears glasses    to drive    Past Surgical History:  Procedure Laterality Date  . AUGMENTATION MAMMAPLASTY Left    breast lift   . BREAST LUMPECTOMY Left 03/06/2014  . BREAST LUMPECTOMY Right 11/2000  . BREAST LUMPECTOMY WITH NEEDLE LOCALIZATION AND AXILLARY SENTINEL LYMPH NODE BX Left 03/06/2014   Procedure: BREAST LUMPECTOMY WITH NEEDLE LOCALIZATION AND AXILLARY SENTINEL LYMPH NODE BIOPSY;  Surgeon: Shann Medal, MD;  Location: [place];  Service: General;  Laterality: Left;  . BREAST SURGERY  2002   lumpectomy on right side  . CESAREAN SECTION    . MASTOPEXY Left 09/07/2017   Procedure: LEFT BREAST MASTOPEXY;  Surgeon: Irene Limbo, MD;  Location: [place];  Service: Plastics;  Laterality: Left;  . PORT-A-CATH REMOVAL Right 08/03/2014    Procedure: REMOVAL PORT-A-CATH;  Surgeon: Jackolyn Confer, MD;  Location[facility];  Service: General;  Laterality: Right;  . PORTACATH PLACEMENT Right 03/06/2014   Procedure: INSERTION PORT-A-CATH;  Surgeon: Shann Medal, MD;  Location: [place];  Service: General;  Laterality: Right;  subclavian  . ROTATOR CUFF REPAIR Right 02/04/2021  . TONSILLECTOMY      There were no vitals filed for this visit.    Subjective Assessment - 02/17/21 1342    Subjective pt is a 60 y.o  F s/p R RCR on 02/04/2021. since surgery still having pain in the shoulder located on the front and top of the shoudler. she reports n/T in the tips of the fingers. She reports continued difficulty with signing trying to turn her whole body vs using just the R.    Patient Stated Goals to get stronger    Currently in Pain? Yes    Pain Score 7     Pain Location Shoulder    Pain Orientation Right    Pain Descriptors / Indicators Aching    Pain Type Surgical pain    Pain Onset 1 to 4 weeks ago    Aggravating Factors  general activity.    Pain Relieving Factors N/A              [facility] PT Assessment - 02/17/21 0001  Assessment   Medical Diagnosis R RCR    Referring Provider (PT) Tania Ade    Onset Date/Surgical Date --   02/04/2021   Hand Dominance Right    Next MD Visit 1 month    Prior Therapy no      Precautions   Precautions Shoulder    Precaution Comments no working 6 weeks      Restrictions   Weight Bearing Restrictions Yes    RUE Weight Bearing Non weight bearing      Balance Screen   Has the patient fallen in the past 6 months No      [place] residence    Living Arrangements Alone    Available Help at Discharge Family    Type of [place]      Prior Function   Level of [place] Part time employment   [employer]     Cognition   Overall Cognitive Status Within Functional Limits for tasks assessed       Observation/Other Assessments   Observations surgical site are healing well and appear clean and well.    Focus on Therapeutic Outcomes (FOTO)  32% function   67% function     Posture/Postural Control   Posture/Postural Control Postural limitations    Postural Limitations Forward head;Rounded Shoulders      ROM / Strength   AROM / PROM / Strength AROM;PROM;Strength      AROM   Overall AROM Comments AROM not assessed on the RUE    AROM Assessment Site Shoulder    Right/Left Shoulder Right;Left    Left Shoulder Flexion 122 Degrees    Left Shoulder ABduction 110 Degrees    Left Shoulder Internal Rotation --   T8   Left Shoulder External Rotation --   T1     PROM   PROM Assessment Site Shoulder    Right/Left Shoulder Right;Left    Right Shoulder Flexion 25 Degrees    Right Shoulder ABduction 85 Degrees    Right Shoulder Internal Rotation --   to stomach   Right Shoulder External Rotation --   18     Strength   Overall Strength Comments MMT not performed on the RUE    Strength Assessment Site Shoulder    Right/Left Shoulder Right;Left    Left Shoulder Flexion 4+/5    Left Shoulder Extension 4+/5    Left Shoulder ABduction 4+/5    Left Shoulder Internal Rotation 4+/5    Left Shoulder External Rotation 4+/5      Palpation   Palpation comment .TTP along the L anterior / middle deltoid with stiffness noted in the upper trap/ levator scapulae.                      Objective measurements completed on examination: See above findings.               PT Education - 02/17/21 1415    Education Details evaluation findings, POC, goals, HEP with proper form/ rationale. FOTO assessment    Person(s) Educated Patient    Methods Explanation;Verbal cues;Tactile cues;Demonstration    Comprehension Verbalized understanding;Verbal cues required            PT Short Term Goals - 02/17/21 1416      PT SHORT TERM GOAL #1   Title pt to be IND with inital HEP     Time 4    Period Weeks  Status New    Target Date 03/17/21      PT SHORT TERM GOAL #2   Title increase R shoulder AAROM to >/= 90 degrees flexion / abduction for therapuetic progression    Time 4    Period Weeks    Status New    Target Date 03/17/21      PT SHORT TERM GOAL #3   Title pt to verbalize/ demo efficient posture to reduce and prevent R shoulder pain    Time 4    Period Weeks    Status New    Target Date 03/17/21             PT Long Term Goals - 02/17/21 1442      PT LONG TERM GOAL #1   Title increase R shoulder AROM flexion/ abduction to >/= 120 degrees and IR/ER WFL compared bil for functional ROM required for ADLs with </=2/10 max pain    Time 8    Period Weeks    Status New    Target Date 04/14/21      PT LONG TERM GOAL #2   Title increase R shoulder gross strengthe to >/= 4+/5 to promote scapular stability    Time 8    Period Weeks    Status New    Target Date 04/14/21      PT LONG TERM GOAL #3   Title pt to be able to lift and / lower >/= 15# from floor<>waist, and 8# to and  from waist to overhead for functional ROM And strength    Time 8    Period Weeks    Status New    Target Date 04/14/21      PT LONG TERM GOAL #4   Title increase FOTO score to >/= 67% to demo improvement in function    Time 8    Period Weeks    Status New    Target Date 04/14/21      PT LONG TERM GOAL #5   Title pt to be IND with all HEP given and will be able to maintain and progress current LOF IND    Time 8    Period Weeks    Status New    Target Date 04/14/21                  Plan - 02/17/21 1415    Clinical Impression Statement pt is a pleasant 60 y.o F s/p R RCR on 02/04/2021. She demonstrates limited R shoulder PROM secondary to pain and guarding which is expected following surgery. MMT and AROM weren't assessed per precautions/ protocol.TTP along the L anterior / middle deltoid with stiffness noted in the upper trap/ levator scapulae. She would  benefit from physical therapy to increased R shoulder AROM Strength and maximize her function by addressing the deficits listed.    Personal Factors and Comorbidities Comorbidity 2    Comorbidities hx of Cx, DM    Stability/Clinical Decision Making Evolving/Moderate complexity    Clinical Decision Making Moderate    Rehab Potential Good    PT Frequency 2x / week    PT Duration 8 weeks    PT Treatment/Interventions ADLs/Self Care Home Management;Cryotherapy;Stair training;Functional mobility training;Therapeutic activities;Therapeutic exercise;Balance training;Neuromuscular re-education;Manual techniques;Patient/family education;Passive range of motion;Taping;Vasopneumatic Device    PT Next Visit Plan review/ update HEP PRN, see PROTOCOL, shoulder PROM, scapular setting, elbow AROM, vaso for pain/ stiffness PRN,    PT Home Exercise Plan [id number] - pendulums, table slides flexion/ abduction,  scapular retraction, upper trap stretch.    Consulted and Agree with Plan of Care Patient           Patient will benefit from skilled therapeutic intervention in order to improve the following deficits and impairments:  Improper body mechanics,Increased muscle spasms,Decreased strength,Postural dysfunction,Pain,Decreased range of motion,Decreased endurance,Decreased activity tolerance,Increased edema  Visit Diagnosis: Chronic right shoulder pain  Muscle weakness (generalized)  Stiffness of right shoulder, not elsewhere classified  Abnormal posture  Localized edema     Problem List Patient Active Problem List   Diagnosis Date Noted  . Vitamin D deficiency 01/04/2017  . Breast cancer of upper-inner quadrant of left female breast ([place]) 09/02/2016  . Thrombocytopenia ([place]) 10/09/2015  . Lymphedema of breast 10/09/2015  . Genetic testing 09/23/2015  . Peripheral neuropathy due to chemotherapy ([place]) 08/14/2014  . Cellulitis of chest wall 08/01/2014  . Port-A-Cath in place 08/01/2014  . Altered  mental status 07/29/2014  . Transaminasemia 07/29/2014  . Conjunctivitis 07/29/2014  . Deafness congenital 03/20/2014  . History of right breast cancer 01/11/2014    Starr Lake PT, DPT, LAT, ATC  02/17/21  2:51 PM      [facility] [address] [place], [place], [address] Phone: [phone]   Fax:  [phone]  Name: Skylinn Vialpando MRN: [id number] Date of Birth: 08-04-1961

## 2021-02-20 ENCOUNTER — Ambulatory Visit: Payer: BC Managed Care – PPO

## 2021-02-20 ENCOUNTER — Other Ambulatory Visit: Payer: Self-pay

## 2021-02-20 DIAGNOSIS — R293 Abnormal posture: Secondary | ICD-10-CM

## 2021-02-20 DIAGNOSIS — M25511 Pain in right shoulder: Secondary | ICD-10-CM | POA: Diagnosis not present

## 2021-02-20 DIAGNOSIS — M25611 Stiffness of right shoulder, not elsewhere classified: Secondary | ICD-10-CM

## 2021-02-20 DIAGNOSIS — M6281 Muscle weakness (generalized): Secondary | ICD-10-CM

## 2021-02-20 DIAGNOSIS — G8929 Other chronic pain: Secondary | ICD-10-CM

## 2021-02-20 DIAGNOSIS — R600 Localized edema: Secondary | ICD-10-CM

## 2021-02-20 NOTE — Therapy (Signed)
[place], [place], [address] Phone: [phone]   Fax:  [phone]  Physical Therapy Treatment  Patient Details  Name: Regina Watts MRN: [id number] Date of Birth: 1961-06-27 Referring Provider (PT): Tania Ade   Encounter Date: 02/20/2021   PT End of Session - 02/20/21 0936    Visit Number 2    Number of Visits 17    Date for PT Re-Evaluation 05/12/21    Authorization Type [employer] FOTO 6th and 10th visit.    Progress Note Due on Visit 10    PT Start Time [phone]    PT Stop Time 1015    PT Time Calculation (min) 39 min    Activity Tolerance Patient limited by pain;Patient tolerated treatment well    Behavior During Therapy [facility] for tasks assessed/performed           Past Medical History:  Diagnosis Date  . Breast cancer ([place]) JANUARY 2002   RIGHT BREAST T4 SENTINEL NODE NEGATIVE, HER2 3+, ER/PR NEGATIVE  . Breast cancer [facility]) February 2015   Left Breast T1N0  . Congenital deafness   . Hx antineoplastic chemo   . Hx of radiation therapy   . Hyperlipidemia   . Personal history of radiation therapy   . Wears glasses    to drive    Past Surgical History:  Procedure Laterality Date  . AUGMENTATION MAMMAPLASTY Left    breast lift   . BREAST LUMPECTOMY Left 03/06/2014  . BREAST LUMPECTOMY Right 11/2000  . BREAST LUMPECTOMY WITH NEEDLE LOCALIZATION AND AXILLARY SENTINEL LYMPH NODE BX Left 03/06/2014   Procedure: BREAST LUMPECTOMY WITH NEEDLE LOCALIZATION AND AXILLARY SENTINEL LYMPH NODE BIOPSY;  Surgeon: Shann Medal, MD;  Location: [place];  Service: General;  Laterality: Left;  . BREAST SURGERY  2002   lumpectomy on right side  . CESAREAN SECTION    . MASTOPEXY Left 09/07/2017   Procedure: LEFT BREAST MASTOPEXY;  Surgeon: Irene Limbo, MD;  Location: [place];  Service: Plastics;  Laterality: Left;  . PORT-A-CATH REMOVAL Right 08/03/2014    Procedure: REMOVAL PORT-A-CATH;  Surgeon: Jackolyn Confer, MD;  [facility];  Service: General;  Laterality: Right;  . PORTACATH PLACEMENT Right 03/06/2014   Procedure: INSERTION PORT-A-CATH;  Surgeon: Shann Medal, MD;  Location: [place];  Service: General;  Laterality: Right;  subclavian  . ROTATOR CUFF REPAIR Right 02/04/2021  . TONSILLECTOMY      There were no vitals filed for this visit.   Subjective Assessment - 02/20/21 0936    Subjective It's improving some since surgery, since Monday has felt the same.    Patient Stated Goals to get stronger    Currently in Pain? Yes    Pain Score 6     Pain Location Shoulder    Pain Onset 1 to 4 weeks ago                             [facility] Adult PT Treatment/Exercise - 02/20/21 0001      Self-Care   Self-Care Other Self-Care Comments    Other Self-Care Comments  see pt edu      Exercises   Exercises Shoulder      Shoulder Exercises: Standing   Flexion PROM;Both    Flexion Limitations 2x30" at counter with no incr weight on R UE    Retraction 10 reps   5",  verbal/visual/tactile cues for shoulder depression and retraction, decr UT compensation   Retraction Limitations at doorway corner      Shoulder Exercises: ROM/Strengthening   Pendulum fwd/back, side-to-side x1 min each      Manual Therapy   Manual Therapy Joint mobilization;Soft tissue mobilization;Passive ROM;Muscle Energy Technique    Manual therapy comments supine    Joint Mobilization grade 3 inf/post GHJ glides    Soft tissue mobilization STM to R UT, LS, pecs, posterior cuff, medial deltoid    Passive ROM shoulder all planes    Muscle Energy Technique reciprocal inhibition into shoulder PROM flexion                  PT Education - 02/20/21 1244    Education Details modify pendulums to keep it passive, standing counter shoulder flexion versus seated to reduce pain and improve tolerance    Person(s) Educated Patient     Methods Explanation;Demonstration;Tactile cues;Verbal cues    Comprehension Verbalized understanding;Returned demonstration;Verbal cues required;Tactile cues required            PT Short Term Goals - 02/17/21 1416      PT SHORT TERM GOAL #1   Title pt to be IND with inital HEP    Time 4    Period Weeks    Status New    Target Date 03/17/21      PT SHORT TERM GOAL #2   Title increase R shoulder AAROM to >/= 90 degrees flexion / abduction for therapuetic progression    Time 4    Period Weeks    Status New    Target Date 03/17/21      PT SHORT TERM GOAL #3   Title pt to verbalize/ demo efficient posture to reduce and prevent R shoulder pain    Time 4    Period Weeks    Status New    Target Date 03/17/21             PT Long Term Goals - 02/17/21 1442      PT LONG TERM GOAL #1   Title increase R shoulder AROM flexion/ abduction to >/= 120 degrees and IR/ER WFL compared bil for functional ROM required for ADLs with </=2/10 max pain    Time 8    Period Weeks    Status New    Target Date 04/14/21      PT LONG TERM GOAL #2   Title increase R shoulder gross strengthe to >/= 4+/5 to promote scapular stability    Time 8    Period Weeks    Status New    Target Date 04/14/21      PT LONG TERM GOAL #3   Title pt to be able to lift and / lower >/= 15# from floor<>waist, and 8# to and  from waist to overhead for functional ROM And strength    Time 8    Period Weeks    Status New    Target Date 04/14/21      PT LONG TERM GOAL #4   Title increase FOTO score to >/= 67% to demo improvement in function    Time 8    Period Weeks    Status New    Target Date 04/14/21      PT LONG TERM GOAL #5   Title pt to be IND with all HEP given and will be able to maintain and progress current LOF IND    Time 8    Period Weeks  Status New    Target Date 04/14/21                 Plan - 02/20/21 0936    Clinical Impression Statement Pt presents with interpreter, Sarina Ser. She does not like her sling, reporting it makes her shoulder hurt worse, and admittedly is not wearing it, but does wear it upon arrival to PT. She c/o pain with PROM shoulder flexion and abduction table slides, less so with standing PROM shoulder flexion on counter. Pt also demonstrates pendulums with active shoulder motion corrected with visual cues to move body versus just UE. She was moderately guarded with manual therapy, often shrugging her R shoulder and utilizing R UE to sign. Some tension decreased with passive prolonged flexion stretch in supine to ~100. Educated to modify flexion stretch for now, as well as pendulums.    Personal Factors and Comorbidities Comorbidity 2    Comorbidities hx of Cx, DM    Stability/Clinical Decision Making Evolving/Moderate complexity    Rehab Potential Good    PT Frequency 2x / week    PT Duration 8 weeks    PT Treatment/Interventions ADLs/Self Care Home Management;Cryotherapy;Stair training;Functional mobility training;Therapeutic activities;Therapeutic exercise;Balance training;Neuromuscular re-education;Manual techniques;Patient/family education;Passive range of motion;Taping;Vasopneumatic Device    PT Next Visit Plan review/ update HEP PRN, see PROTOCOL, shoulder PROM, scapular setting, elbow AROM, vaso for pain/ stiffness PRN,    PT Home Exercise Plan [email or web address] - pendulums, table slides flexion/ abduction, scapular retraction, upper trap stretch.    Consulted and Agree with Plan of Care Patient           Patient will benefit from skilled therapeutic intervention in order to improve the following deficits and impairments:  Improper body mechanics,Increased muscle spasms,Decreased strength,Postural dysfunction,Pain,Decreased range of motion,Decreased endurance,Decreased activity tolerance,Increased edema  Visit Diagnosis: Chronic right shoulder pain  Muscle weakness (generalized)  Stiffness of right shoulder, not elsewhere  classified  Abnormal posture  Localized edema     Problem List Patient Active Problem List   Diagnosis Date Noted  . Vitamin D deficiency 01/04/2017  . Breast cancer of upper-inner quadrant of left female breast ([place]) 09/02/2016  . Thrombocytopenia ([place]) 10/09/2015  . Lymphedema of breast 10/09/2015  . Genetic testing 09/23/2015  . Peripheral neuropathy due to chemotherapy ([place]) 08/14/2014  . Cellulitis of chest wall 08/01/2014  . Port-A-Cath in place 08/01/2014  . Altered mental status 07/29/2014  . Transaminasemia 07/29/2014  . Conjunctivitis 07/29/2014  . Deafness congenital 03/20/2014  . History of right breast cancer 01/11/2014    Izell [place], PT, DPT 02/20/2021, 12:51 PM  [facility] [address] [place], [place], [address] Phone: [phone]   Fax:  [phone]  Name: Regina Watts MRN: [id number] Date of Birth: 1961-02-01

## 2021-02-25 ENCOUNTER — Ambulatory Visit: Payer: BC Managed Care – PPO

## 2021-02-25 ENCOUNTER — Other Ambulatory Visit: Payer: Self-pay

## 2021-02-25 DIAGNOSIS — R293 Abnormal posture: Secondary | ICD-10-CM

## 2021-02-25 DIAGNOSIS — M25611 Stiffness of right shoulder, not elsewhere classified: Secondary | ICD-10-CM

## 2021-02-25 DIAGNOSIS — M6281 Muscle weakness (generalized): Secondary | ICD-10-CM

## 2021-02-25 DIAGNOSIS — M25511 Pain in right shoulder: Secondary | ICD-10-CM | POA: Diagnosis not present

## 2021-02-25 DIAGNOSIS — G8929 Other chronic pain: Secondary | ICD-10-CM

## 2021-02-25 DIAGNOSIS — R600 Localized edema: Secondary | ICD-10-CM

## 2021-02-25 NOTE — Therapy (Signed)
[place], [place], [address] Phone: [phone]   Fax:  [phone]  Physical Therapy Treatment  Patient Details  Name: Regina Watts MRN: [id number] Date of Birth: [birth year] Referring Provider (PT): Tania Ade   Encounter Date: 02/25/2021   PT End of Session - 02/25/21 1416    Visit Number 3    Number of Visits 17    Date for PT Re-Evaluation 05/12/21    Authorization Type [employer] FOTO 6th and 10th visit.    Progress Note Due on Visit 10    PT Start Time 1331    PT Stop Time 1416    PT Time Calculation (min) 45 min    Activity Tolerance Patient limited by pain;Patient tolerated treatment well    Behavior During Therapy [facility] for tasks assessed/performed           Past Medical History:  Diagnosis Date  . Breast cancer ([place]) JANUARY 2002   RIGHT BREAST T4 SENTINEL NODE NEGATIVE, HER2 3+, ER/PR NEGATIVE  . Breast cancer [facility]) February 2015   Left Breast T1N0  . Congenital deafness   . Hx antineoplastic chemo   . Hx of radiation therapy   . Hyperlipidemia   . Personal history of radiation therapy   . Wears glasses    to drive    Past Surgical History:  Procedure Laterality Date  . AUGMENTATION MAMMAPLASTY Left    breast lift   . BREAST LUMPECTOMY Left 03/06/2014  . BREAST LUMPECTOMY Right 11/2000  . BREAST LUMPECTOMY WITH NEEDLE LOCALIZATION AND AXILLARY SENTINEL LYMPH NODE BX Left 03/06/2014   Procedure: BREAST LUMPECTOMY WITH NEEDLE LOCALIZATION AND AXILLARY SENTINEL LYMPH NODE BIOPSY;  Surgeon: Shann Medal, MD;  Location: [place];  Service: General;  Laterality: Left;  . BREAST SURGERY  2002   lumpectomy on right side  . CESAREAN SECTION    . MASTOPEXY Left 09/07/2017   Procedure: LEFT BREAST MASTOPEXY;  Surgeon: Irene Limbo, MD;  Location: [place];  Service: Plastics;  Laterality: Left;  . PORT-A-CATH REMOVAL Right 08/03/2014    Procedure: REMOVAL PORT-A-CATH;  Surgeon: Jackolyn Confer, MD;  [facility];  Service: General;  Laterality: Right;  . PORTACATH PLACEMENT Right 03/06/2014   Procedure: INSERTION PORT-A-CATH;  Surgeon: Shann Medal, MD;  Location: [place];  Service: General;  Laterality: Right;  subclavian  . ROTATOR CUFF REPAIR Right 02/04/2021  . TONSILLECTOMY      There were no vitals filed for this visit.   Subjective Assessment - 02/25/21 1336    Subjective "It's worse at night when i'm sleeping. I'm restless". I'm doing the exercises and can stretch more when I'm standing at the counter. "Last night, I slipped and fell in the kitchen on my left side".    Patient is accompained by: Interpreter   [name]   Patient Stated Goals to get stronger    Currently in Pain? Yes    Pain Score 6     Pain Location Shoulder    Pain Orientation Right    Pain Descriptors / Indicators Aching    Pain Type Surgical pain    Pain Onset 1 to 4 weeks ago                             [facility] Adult PT Treatment/Exercise - 02/25/21 0001      Therapeutic Activites    Therapeutic Activities  Other Therapeutic Activities    Other Therapeutic Activities practiced donning and doffing sling, keeping shoulder passive      Manual Therapy   Manual Therapy Joint mobilization;Soft tissue mobilization;Passive ROM;Muscle Energy Technique    Manual therapy comments supine    Joint Mobilization grade 3 inf/post GHJ glides    Soft tissue mobilization STM to R UT, LS, pecs, posterior cuff, medial deltoid    Passive ROM shoulder all planes    Muscle Energy Technique reciprocal inhibition into shoulder PROM flexion                  PT Education - 02/25/21 1419    Education Details Heavy education on passive phase importance and need to rest shoulder to protect it; demonstrated muscle pulling on tendon to move shoulder and what can happen to tendon    Person(s) Educated Patient     Methods Explanation;Demonstration;Tactile cues;Verbal cues    Comprehension Verbalized understanding;Returned demonstration;Verbal cues required;Tactile cues required            PT Short Term Goals - 02/17/21 1416      PT SHORT TERM GOAL #1   Title pt to be IND with inital HEP    Time 4    Period Weeks    Status New    Target Date 03/17/21      PT SHORT TERM GOAL #2   Title increase R shoulder AAROM to >/= 90 degrees flexion / abduction for therapuetic progression    Time 4    Period Weeks    Status New    Target Date 03/17/21      PT SHORT TERM GOAL #3   Title pt to verbalize/ demo efficient posture to reduce and prevent R shoulder pain    Time 4    Period Weeks    Status New    Target Date 03/17/21             PT Long Term Goals - 02/17/21 1442      PT LONG TERM GOAL #1   Title increase R shoulder AROM flexion/ abduction to >/= 120 degrees and IR/ER WFL compared bil for functional ROM required for ADLs with </=2/10 max pain    Time 8    Period Weeks    Status New    Target Date 04/14/21      PT LONG TERM GOAL #2   Title increase R shoulder gross strengthe to >/= 4+/5 to promote scapular stability    Time 8    Period Weeks    Status New    Target Date 04/14/21      PT LONG TERM GOAL #3   Title pt to be able to lift and / lower >/= 15# from floor<>waist, and 8# to and  from waist to overhead for functional ROM And strength    Time 8    Period Weeks    Status New    Target Date 04/14/21      PT LONG TERM GOAL #4   Title increase FOTO score to >/= 67% to demo improvement in function    Time 8    Period Weeks    Status New    Target Date 04/14/21      PT LONG TERM GOAL #5   Title pt to be IND with all HEP given and will be able to maintain and progress current LOF IND    Time 8    Period Weeks    Status New  Target Date 04/14/21                 Plan - 02/25/21 1418    Clinical Impression Statement Pt has been overusing her shoulder,  reportedly lifting boxing yesterday and using R UE to drink coffee with, etc. Demonstrated what can happen to new tendon via paper towels, if pateint continues to do this and educated on importance of keeping shoulder passive during protective phase. Practiced putting sling on with pt and having her do so individually with improvement, but continued difficulty. She was on board with resting her shoulder and doing less.    Personal Factors and Comorbidities Comorbidity 2    Comorbidities hx of Cx, DM    Stability/Clinical Decision Making Evolving/Moderate complexity    Rehab Potential Good    PT Frequency 2x / week    PT Duration 8 weeks    PT Treatment/Interventions ADLs/Self Care Home Management;Cryotherapy;Stair training;Functional mobility training;Therapeutic activities;Therapeutic exercise;Balance training;Neuromuscular re-education;Manual techniques;Patient/family education;Passive range of motion;Taping;Vasopneumatic Device    PT Next Visit Plan review/ update HEP PRN, see PROTOCOL, shoulder PROM, scapular setting, elbow AROM, vaso for pain/ stiffness PRN,    PT Home Exercise Plan [id number] - pendulums, table slides flexion/ abduction, scapular retraction, upper trap stretch.    Consulted and Agree with Plan of Care Patient           Patient will benefit from skilled therapeutic intervention in order to improve the following deficits and impairments:  Improper body mechanics,Increased muscle spasms,Decreased strength,Postural dysfunction,Pain,Decreased range of motion,Decreased endurance,Decreased activity tolerance,Increased edema  Visit Diagnosis: Chronic right shoulder pain  Muscle weakness (generalized)  Stiffness of right shoulder, not elsewhere classified  Abnormal posture  Localized edema     Problem List Patient Active Problem List   Diagnosis Date Noted  . Vitamin D deficiency 01/04/2017  . Breast cancer of upper-inner quadrant of left female breast ([place]) 09/02/2016   . Thrombocytopenia ([place]) 10/09/2015  . Lymphedema of breast 10/09/2015  . Genetic testing 09/23/2015  . Peripheral neuropathy due to chemotherapy ([place]) 08/14/2014  . Cellulitis of chest wall 08/01/2014  . Port-A-Cath in place 08/01/2014  . Altered mental status 07/29/2014  . Transaminasemia 07/29/2014  . Conjunctivitis 07/29/2014  . Deafness congenital 03/20/2014  . History of right breast cancer 01/11/2014    Izell [place], PT, DPT 02/25/2021, 2:22 PM  [facility] [address] [place], [place], [address] Phone: [phone]   Fax:  [phone]  Name: Regina Watts MRN: [id number] Date of Birth: [birth year]

## 2021-03-04 ENCOUNTER — Ambulatory Visit: Payer: BC Managed Care – PPO | Admitting: Physical Therapy

## 2021-03-04 ENCOUNTER — Other Ambulatory Visit: Payer: Self-pay

## 2021-03-04 DIAGNOSIS — R600 Localized edema: Secondary | ICD-10-CM

## 2021-03-04 DIAGNOSIS — M25511 Pain in right shoulder: Secondary | ICD-10-CM

## 2021-03-04 DIAGNOSIS — G8929 Other chronic pain: Secondary | ICD-10-CM

## 2021-03-04 DIAGNOSIS — R293 Abnormal posture: Secondary | ICD-10-CM

## 2021-03-04 DIAGNOSIS — M6281 Muscle weakness (generalized): Secondary | ICD-10-CM

## 2021-03-04 DIAGNOSIS — M25611 Stiffness of right shoulder, not elsewhere classified: Secondary | ICD-10-CM

## 2021-03-04 NOTE — Therapy (Signed)
[place], [place], [address] Phone: [phone]   Fax:  [phone]  Physical Therapy Treatment  Patient Details  Name: Regina Watts MRN: [id number] Date of Birth: Oct 24, 1961 Referring Provider (PT): Tania Ade   Encounter Date: 03/04/2021   PT End of Session - 03/04/21 1316    Visit Number 4    Number of Visits 17    Date for PT Re-Evaluation 05/12/21    Authorization Type [employer] FOTO 6th and 10th visit.    Progress Note Due on Visit 10    PT Start Time 1104    PT Stop Time 1145    PT Time Calculation (min) 41 min    Activity Tolerance Patient limited by pain;Patient tolerated treatment well    Behavior During Therapy [facility] for tasks assessed/performed           Past Medical History:  Diagnosis Date  . Breast cancer ([place]) JANUARY 2002   RIGHT BREAST T4 SENTINEL NODE NEGATIVE, HER2 3+, ER/PR NEGATIVE  . Breast cancer [place] [facility]) February 2015   Left Breast T1N0  . Congenital deafness   . Hx antineoplastic chemo   . Hx of radiation therapy   . Hyperlipidemia   . Personal history of radiation therapy   . Wears glasses    to drive    Past Surgical History:  Procedure Laterality Date  . AUGMENTATION MAMMAPLASTY Left    breast lift   . BREAST LUMPECTOMY Left 03/06/2014  . BREAST LUMPECTOMY Right 11/2000  . BREAST LUMPECTOMY WITH NEEDLE LOCALIZATION AND AXILLARY SENTINEL LYMPH NODE BX Left 03/06/2014   Procedure: BREAST LUMPECTOMY WITH NEEDLE LOCALIZATION AND AXILLARY SENTINEL LYMPH NODE BIOPSY;  Surgeon: Shann Medal, MD;  Location: [place];  Service: General;  Laterality: Left;  . BREAST SURGERY  2002   lumpectomy on right side  . CESAREAN SECTION    . MASTOPEXY Left 09/07/2017   Procedure: LEFT BREAST MASTOPEXY;  Surgeon: Irene Limbo, MD;  Location: [place];  Service: Plastics;  Laterality: Left;  . PORT-A-CATH REMOVAL Right 08/03/2014    Procedure: REMOVAL PORT-A-CATH;  Surgeon: Jackolyn Confer, MD;  [facility];  Service: General;  Laterality: Right;  . PORTACATH PLACEMENT Right 03/06/2014   Procedure: INSERTION PORT-A-CATH;  Surgeon: Shann Medal, MD;  Location: [place];  Service: General;  Laterality: Right;  subclavian  . ROTATOR CUFF REPAIR Right 02/04/2021  . TONSILLECTOMY      There were no vitals filed for this visit.   Subjective Assessment - 03/04/21 1107    Subjective Pain continues at night when she lays down. She has not been sleeping with arm propped on pillows. Pt does not difficulty with not using her R shoulder too much. Pt does note she has not been doing her exercises as frequently as she should.    Patient is accompained by: Interpreter   [name] for ASL   Patient Stated Goals to get stronger    Currently in Pain? Yes    Pain Score 4     Pain Location Shoulder    Pain Orientation Right    Pain Descriptors / Indicators Aching    Pain Type Surgical pain    Pain Onset 1 to 4 weeks ago                             [facility] Adult PT Treatment/Exercise - 03/04/21 0001  Shoulder Exercises: Supine   Other Supine Exercises Dowel AAROM flexion, abduction, ER to neutral x10      Manual Therapy   Manual Therapy Joint mobilization;Soft tissue mobilization;Passive ROM;Muscle Energy Technique    Manual therapy comments supine    Joint Mobilization grade 3 inf/post GHJ glides    Soft tissue mobilization STM to R UT, pecs, posterior cuff, medial deltoid & bicep    Passive ROM shoulder all planes                  PT Education - 03/04/21 1308    Education Details Reinforced passive phase of her rehab. Discussed improving sleep positioning with pillows to decrease shoulder pain.    Person(s) Educated Patient    Methods Explanation;Demonstration;Tactile cues;Verbal cues    Comprehension Verbalized understanding;Returned demonstration;Verbal cues  required;Tactile cues required            PT Short Term Goals - 02/17/21 1416      PT SHORT TERM GOAL #1   Title pt to be IND with inital HEP    Time 4    Period Weeks    Status New    Target Date 03/17/21      PT SHORT TERM GOAL #2   Title increase R shoulder AAROM to >/= 90 degrees flexion / abduction for therapuetic progression    Time 4    Period Weeks    Status New    Target Date 03/17/21      PT SHORT TERM GOAL #3   Title pt to verbalize/ demo efficient posture to reduce and prevent R shoulder pain    Time 4    Period Weeks    Status New    Target Date 03/17/21             PT Long Term Goals - 02/17/21 1442      PT LONG TERM GOAL #1   Title increase R shoulder AROM flexion/ abduction to >/= 120 degrees and IR/ER WFL compared bil for functional ROM required for ADLs with </=2/10 max pain    Time 8    Period Weeks    Status New    Target Date 04/14/21      PT LONG TERM GOAL #2   Title increase R shoulder gross strengthe to >/= 4+/5 to promote scapular stability    Time 8    Period Weeks    Status New    Target Date 04/14/21      PT LONG TERM GOAL #3   Title pt to be able to lift and / lower >/= 15# from floor<>waist, and 8# to and  from waist to overhead for functional ROM And strength    Time 8    Period Weeks    Status New    Target Date 04/14/21      PT LONG TERM GOAL #4   Title increase FOTO score to >/= 67% to demo improvement in function    Time 8    Period Weeks    Status New    Target Date 04/14/21      PT LONG TERM GOAL #5   Title pt to be IND with all HEP given and will be able to maintain and progress current LOF IND    Time 8    Period Weeks    Status New    Target Date 04/14/21                 Plan - 03/04/21  1310    Clinical Impression Statement Increased guarding with R shoulder during PROM. Stretches provided for chest and bicep (these are likely tight from signing and using sling). PROM remains limited due to pain  to ~90 deg abduction and flexion. Reinforced scapular retraction as pt's humeral head continues to sit anteriorly in [facility] joint. Discussed modifying table slides to dowel stretches pending on pt's comfort.    Personal Factors and Comorbidities Comorbidity 2    Comorbidities hx of Cx, DM    Stability/Clinical Decision Making Evolving/Moderate complexity    Rehab Potential Good    PT Frequency 2x / week    PT Duration 8 weeks    PT Treatment/Interventions ADLs/Self Care Home Management;Cryotherapy;Stair training;Functional mobility training;Therapeutic activities;Therapeutic exercise;Balance training;Neuromuscular re-education;Manual techniques;Patient/family education;Passive range of motion;Taping;Vasopneumatic Device    PT Next Visit Plan review/ update HEP PRN, see PROTOCOL, shoulder PROM, scapular setting, elbow AROM, vaso for pain/ stiffness PRN,    PT Home Exercise Plan [id number] - pendulums, table slides flexion/ abduction, scapular retraction, upper trap stretch.    Consulted and Agree with Plan of Care Patient           Patient will benefit from skilled therapeutic intervention in order to improve the following deficits and impairments:  Improper body mechanics,Increased muscle spasms,Decreased strength,Postural dysfunction,Pain,Decreased range of motion,Decreased endurance,Decreased activity tolerance,Increased edema  Visit Diagnosis: Chronic right shoulder pain  Muscle weakness (generalized)  Stiffness of right shoulder, not elsewhere classified  Abnormal posture  Localized edema     Problem List Patient Active Problem List   Diagnosis Date Noted  . Vitamin D deficiency 01/04/2017  . Breast cancer of upper-inner quadrant of left female breast ([place]) 09/02/2016  . Thrombocytopenia ([place]) 10/09/2015  . Lymphedema of breast 10/09/2015  . Genetic testing 09/23/2015  . Peripheral neuropathy due to chemotherapy ([place]) 08/14/2014  . Cellulitis of chest wall 08/01/2014  .  Port-A-Cath in place 08/01/2014  . Altered mental status 07/29/2014  . Transaminasemia 07/29/2014  . Conjunctivitis 07/29/2014  . Deafness congenital 03/20/2014  . History of right breast cancer 01/11/2014    [facility] April Ma L [place] PT, DPT 03/04/2021, 1:20 PM  [facility] [address] [place], [place], [address] Phone: [phone]   Fax:  [phone]  Name: Regina Watts MRN: [id number] Date of Birth: 1961-01-31

## 2021-03-06 ENCOUNTER — Other Ambulatory Visit: Payer: Self-pay

## 2021-03-06 ENCOUNTER — Ambulatory Visit: Payer: BC Managed Care – PPO | Admitting: Physical Therapy

## 2021-03-06 DIAGNOSIS — M25511 Pain in right shoulder: Secondary | ICD-10-CM | POA: Diagnosis not present

## 2021-03-06 DIAGNOSIS — R600 Localized edema: Secondary | ICD-10-CM

## 2021-03-06 DIAGNOSIS — R293 Abnormal posture: Secondary | ICD-10-CM

## 2021-03-06 DIAGNOSIS — M6281 Muscle weakness (generalized): Secondary | ICD-10-CM

## 2021-03-06 DIAGNOSIS — M25611 Stiffness of right shoulder, not elsewhere classified: Secondary | ICD-10-CM

## 2021-03-06 DIAGNOSIS — G8929 Other chronic pain: Secondary | ICD-10-CM

## 2021-03-06 NOTE — Therapy (Signed)
[place], [place], [address] Phone: [phone]   Fax:  [phone]  Physical Therapy Treatment  Patient Details  Name: Regina Watts MRN: [id number] Date of Birth: 21-Feb-1961 Referring Provider (PT): Tania Ade   Encounter Date: 03/06/2021   PT End of Session - 03/06/21 1149    Visit Number 5    Number of Visits 17    Date for PT Re-Evaluation 05/12/21    Authorization Type BCBS FOTO 6th and 10th visit.    Progress Note Due on Visit 10    PT Start Time 1105    PT Stop Time 1145    PT Time Calculation (min) 40 min    Activity Tolerance Patient limited by pain;Patient tolerated treatment well    Behavior During Therapy [facility] for tasks assessed/performed           Past Medical History:  Diagnosis Date  . Breast cancer ([place]) JANUARY 2002   RIGHT BREAST T4 SENTINEL NODE NEGATIVE, HER2 3+, ER/PR NEGATIVE  . Breast cancer [facility]) February 2015   Left Breast T1N0  . Congenital deafness   . Hx antineoplastic chemo   . Hx of radiation therapy   . Hyperlipidemia   . Personal history of radiation therapy   . Wears glasses    to drive    Past Surgical History:  Procedure Laterality Date  . AUGMENTATION MAMMAPLASTY Left    breast lift   . BREAST LUMPECTOMY Left 03/06/2014  . BREAST LUMPECTOMY Right 11/2000  . BREAST LUMPECTOMY WITH NEEDLE LOCALIZATION AND AXILLARY SENTINEL LYMPH NODE BX Left 03/06/2014   Procedure: BREAST LUMPECTOMY WITH NEEDLE LOCALIZATION AND AXILLARY SENTINEL LYMPH NODE BIOPSY;  Surgeon: Shann Medal, MD;  Location: [place];  Service: General;  Laterality: Left;  . BREAST SURGERY  2002   lumpectomy on right side  . CESAREAN SECTION    . MASTOPEXY Left 09/07/2017   Procedure: LEFT BREAST MASTOPEXY;  Surgeon: Irene Limbo, MD;  Location: [place];  Service: Plastics;  Laterality: Left;  . PORT-A-CATH REMOVAL Right 08/03/2014    Procedure: REMOVAL PORT-A-CATH;  Surgeon: Jackolyn Confer, MD;  [facility];  Service: General;  Laterality: Right;  . PORTACATH PLACEMENT Right 03/06/2014   Procedure: INSERTION PORT-A-CATH;  Surgeon: Shann Medal, MD;  Location: [place];  Service: General;  Laterality: Right;  subclavian  . ROTATOR CUFF REPAIR Right 02/04/2021  . TONSILLECTOMY      There were no vitals filed for this visit.   Subjective Assessment - 03/06/21 1103    Subjective Pt states that she tried pillows but still couldn't find a comfortable spot. Pt states she has been able to try the exercises.    Patient is accompained by: Interpreter   [name] for ASL   Patient Stated Goals to get stronger    Currently in Pain? Yes    Pain Score 5     Pain Location Shoulder    Pain Orientation Right    Pain Descriptors / Indicators Aching    Pain Type Surgical pain    Pain Onset 1 to 4 weeks ago              [facility] PT Assessment - 03/06/21 0001      PROM   Right Shoulder Flexion 87 Degrees    Right Shoulder ABduction 75 Degrees    Right Shoulder Internal Rotation --   to stomach   Right Shoulder External Rotation  25 Degrees                         [facility] Adult PT Treatment/Exercise - 03/06/21 0001      Shoulder Exercises: Supine   Other Supine Exercises Dowel AAROM flexion, abduction, ER to neutral x10      Shoulder Exercises: Prone   Retraction Strengthening;Both;20 reps      Shoulder Exercises: Pulleys   Flexion 1 minute    Scaption 1 minute      Manual Therapy   Manual therapy comments supine    Joint Mobilization grade 3 inf/post GHJ glides    Soft tissue mobilization STM to R UT, posterior cuff, medial deltoid & bicep    Passive ROM shoulder all planes            Trigger Point Dry Needling - 03/06/21 0001    Consent Given? Yes    Education Handout Provided Yes    Muscles Treated Head and Neck Upper trapezius    Dry Needling Comments Performed by Carlus Pavlov    Upper Trapezius Response Twitch reponse elicited;Palpable increased muscle length                PT Education - 03/06/21 1151    Education Details TPDN, indications, expectations for the next few days. Stretching and shoulder rolls to decrease any soreness.    Person(s) Educated Patient    Methods Explanation;Demonstration;Tactile cues    Comprehension Verbalized understanding;Returned demonstration;Verbal cues required            PT Short Term Goals - 02/17/21 1416      PT SHORT TERM GOAL #1   Title pt to be IND with inital HEP    Time 4    Period Weeks    Status New    Target Date 03/17/21      PT SHORT TERM GOAL #2   Title increase R shoulder AAROM to >/= 90 degrees flexion / abduction for therapuetic progression    Time 4    Period Weeks    Status New    Target Date 03/17/21      PT SHORT TERM GOAL #3   Title pt to verbalize/ demo efficient posture to reduce and prevent R shoulder pain    Time 4    Period Weeks    Status New    Target Date 03/17/21             PT Long Term Goals - 02/17/21 1442      PT LONG TERM GOAL #1   Title increase R shoulder AROM flexion/ abduction to >/= 120 degrees and IR/ER WFL compared bil for functional ROM required for ADLs with </=2/10 max pain    Time 8    Period Weeks    Status New    Target Date 04/14/21      PT LONG TERM GOAL #2   Title increase R shoulder gross strengthe to >/= 4+/5 to promote scapular stability    Time 8    Period Weeks    Status New    Target Date 04/14/21      PT LONG TERM GOAL #3   Title pt to be able to lift and / lower >/= 15# from floor<>waist, and 8# to and  from waist to overhead for functional ROM And strength    Time 8    Period Weeks    Status New    Target Date 04/14/21  PT LONG TERM GOAL #4   Title increase FOTO score to >/= 67% to demo improvement in function    Time 8    Period Weeks    Status New    Target Date 04/14/21      PT LONG TERM GOAL #5    Title pt to be IND with all HEP given and will be able to maintain and progress current LOF IND    Time 8    Period Weeks    Status New    Target Date 04/14/21                 Plan - 03/06/21 1147    Clinical Impression Statement Increased time for manual therapy and TPDN to decrease pt guarding, trigger points, and overactivity. Reviewed dowel AAROM with improved ability and less pain.    Personal Factors and Comorbidities Comorbidity 2    Comorbidities hx of Cx, DM    Stability/Clinical Decision Making Evolving/Moderate complexity    Rehab Potential Good    PT Frequency 2x / week    PT Duration 8 weeks    PT Treatment/Interventions ADLs/Self Care Home Management;Cryotherapy;Stair training;Functional mobility training;Therapeutic activities;Therapeutic exercise;Balance training;Neuromuscular re-education;Manual techniques;Patient/family education;Passive range of motion;Taping;Vasopneumatic Device    PT Next Visit Plan review/ update HEP PRN, see PROTOCOL, shoulder PROM, scapular setting, elbow AROM, vaso for pain/ stiffness PRN,    PT Home Exercise Plan [id number] - pendulums, table slides flexion/ abduction, scapular retraction, upper trap stretch.    Consulted and Agree with Plan of Care Patient           Patient will benefit from skilled therapeutic intervention in order to improve the following deficits and impairments:  Improper body mechanics,Increased muscle spasms,Decreased strength,Postural dysfunction,Pain,Decreased range of motion,Decreased endurance,Decreased activity tolerance,Increased edema  Visit Diagnosis: Chronic right shoulder pain  Muscle weakness (generalized)  Stiffness of right shoulder, not elsewhere classified  Abnormal posture  Localized edema     Problem List Patient Active Problem List   Diagnosis Date Noted  . Vitamin D deficiency 01/04/2017  . Breast cancer of upper-inner quadrant of left female breast ([place]) 09/02/2016  .  Thrombocytopenia ([place]) 10/09/2015  . Lymphedema of breast 10/09/2015  . Genetic testing 09/23/2015  . Peripheral neuropathy due to chemotherapy ([place]) 08/14/2014  . Cellulitis of chest wall 08/01/2014  . Port-A-Cath in place 08/01/2014  . Altered mental status 07/29/2014  . Transaminasemia 07/29/2014  . Conjunctivitis 07/29/2014  . Deafness congenital 03/20/2014  . History of right breast cancer 01/11/2014    [facility] [address] [place] PT, DPT 03/06/2021, 11:52 AM  [facility] [address] [place], [place], [address] Phone: [phone]   Fax:  [phone]  Name: Teyah Rossy MRN: [id number] Date of Birth: 06-23-1961

## 2021-03-12 ENCOUNTER — Ambulatory Visit: Payer: BC Managed Care – PPO | Attending: Orthopedic Surgery | Admitting: Physical Therapy

## 2021-03-12 ENCOUNTER — Other Ambulatory Visit: Payer: Self-pay

## 2021-03-12 DIAGNOSIS — M6281 Muscle weakness (generalized): Secondary | ICD-10-CM | POA: Insufficient documentation

## 2021-03-12 DIAGNOSIS — M25611 Stiffness of right shoulder, not elsewhere classified: Secondary | ICD-10-CM | POA: Insufficient documentation

## 2021-03-12 DIAGNOSIS — M25511 Pain in right shoulder: Secondary | ICD-10-CM | POA: Insufficient documentation

## 2021-03-12 DIAGNOSIS — R600 Localized edema: Secondary | ICD-10-CM | POA: Diagnosis present

## 2021-03-12 DIAGNOSIS — G8929 Other chronic pain: Secondary | ICD-10-CM | POA: Insufficient documentation

## 2021-03-12 DIAGNOSIS — R293 Abnormal posture: Secondary | ICD-10-CM | POA: Insufficient documentation

## 2021-03-12 NOTE — Therapy (Signed)
[place], [place], [address] Phone: [phone]   Fax:  [phone]  Physical Therapy Treatment  Patient Details  Name: Regina Watts MRN: [id number] Date of Birth: 1961/09/27 Referring Provider (PT): Tania Ade   Encounter Date: 03/12/2021   PT End of Session - 03/12/21 1542    Visit Number 6    Number of Visits 17    Date for PT Re-Evaluation 05/12/21    Authorization Type BCBS FOTO 10th visit.    PT Start Time 1500    PT Stop Time 1548    PT Time Calculation (min) 48 min    Activity Tolerance Patient limited by pain;Patient tolerated treatment well    Behavior During Therapy [facility] for tasks assessed/performed           Past Medical History:  Diagnosis Date  . Breast cancer ([place]) JANUARY 2002   RIGHT BREAST T4 SENTINEL NODE NEGATIVE, HER2 3+, ER/PR NEGATIVE  . Breast cancer [facility]) February 2015   Left Breast T1N0  . Congenital deafness   . Hx antineoplastic chemo   . Hx of radiation therapy   . Hyperlipidemia   . Personal history of radiation therapy   . Wears glasses    to drive    Past Surgical History:  Procedure Laterality Date  . AUGMENTATION MAMMAPLASTY Left    breast lift   . BREAST LUMPECTOMY Left 03/06/2014  . BREAST LUMPECTOMY Right 11/2000  . BREAST LUMPECTOMY WITH NEEDLE LOCALIZATION AND AXILLARY SENTINEL LYMPH NODE BX Left 03/06/2014   Procedure: BREAST LUMPECTOMY WITH NEEDLE LOCALIZATION AND AXILLARY SENTINEL LYMPH NODE BIOPSY;  Surgeon: Shann Medal, MD;  Location: [place];  Service: General;  Laterality: Left;  . BREAST SURGERY  2002   lumpectomy on right side  . CESAREAN SECTION    . MASTOPEXY Left 09/07/2017   Procedure: LEFT BREAST MASTOPEXY;  Surgeon: Irene Limbo, MD;  Location: [place];  Service: Plastics;  Laterality: Left;  . PORT-A-CATH REMOVAL Right 08/03/2014   Procedure: REMOVAL PORT-A-CATH;  Surgeon: Jackolyn Confer, MD;  [facility];  Service: General;  Laterality: Right;  . PORTACATH PLACEMENT Right 03/06/2014   Procedure: INSERTION PORT-A-CATH;  Surgeon: Shann Medal, MD;  Location: [place];  Service: General;  Laterality: Right;  subclavian  . ROTATOR CUFF REPAIR Right 02/04/2021  . TONSILLECTOMY      There were no vitals filed for this visit.   Subjective Assessment - 03/12/21 1507    Subjective " I am getting better, the DN seemed to help but iI still have some pain."    Currently in Pain? Yes    Pain Score 4     Pain Orientation Right    Pain Descriptors / Indicators Aching    Pain Type Surgical pain    Pain Onset More than a month ago    Pain Frequency Intermittent    Aggravating Factors  activity              [facility] PT Assessment - 03/12/21 0001      Assessment   Medical Diagnosis R RCR    Referring Provider (PT) Tania Ade                         [facility] Adult PT Treatment/Exercise - 03/12/21 0001      Modalities   Modalities Vasopneumatic      Vasopneumatic   Number Minutes  Vasopneumatic  10 minutes    Vasopnuematic Location  Shoulder    Vasopneumatic Pressure Low    Vasopneumatic Temperature  34      Manual Therapy   Manual Therapy Scapular mobilization    Joint Mobilization grade 3 inf/post and anteriorGHJ glides    Scapular Mobilization scapular mobs in all palnes    Passive ROM shoulder flexion/ abduction with gentle ossiclations to calm down pain                    PT Short Term Goals - 02/17/21 1416      PT SHORT TERM GOAL #1   Title pt to be IND with inital HEP    Time 4    Period Weeks    Status New    Target Date 03/17/21      PT SHORT TERM GOAL #2   Title increase R shoulder AAROM to >/= 90 degrees flexion / abduction for therapuetic progression    Time 4    Period Weeks    Status New    Target Date 03/17/21      PT SHORT TERM GOAL #3   Title pt to verbalize/ demo efficient  posture to reduce and prevent R shoulder pain    Time 4    Period Weeks    Status New    Target Date 03/17/21             PT Long Term Goals - 02/17/21 1442      PT LONG TERM GOAL #1   Title increase R shoulder AROM flexion/ abduction to >/= 120 degrees and IR/ER WFL compared bil for functional ROM required for ADLs with </=2/10 max pain    Time 8    Period Weeks    Status New    Target Date 04/14/21      PT LONG TERM GOAL #2   Title increase R shoulder gross strengthe to >/= 4+/5 to promote scapular stability    Time 8    Period Weeks    Status New    Target Date 04/14/21      PT LONG TERM GOAL #3   Title pt to be able to lift and / lower >/= 15# from floor<>waist, and 8# to and  from waist to overhead for functional ROM And strength    Time 8    Period Weeks    Status New    Target Date 04/14/21      PT LONG TERM GOAL #4   Title increase FOTO score to >/= 67% to demo improvement in function    Time 8    Period Weeks    Status New    Target Date 04/14/21      PT LONG TERM GOAL #5   Title pt to be IND with all HEP given and will be able to maintain and progress current LOF IND    Time 8    Period Weeks    Status New    Target Date 04/14/21                 Plan - 03/12/21 1542    Clinical Impression Statement Focused session on ghj mobs and PROM as well as scapular mobility. pt is 5 weeks post op on 03/11/2021. She tolerated mobs and activity well but did demonstrate increased guarding with mobs. trialed game ready to calm down soreness / stiffness end of session.    PT Treatment/Interventions ADLs/Self Care Home Management;Cryotherapy;Stair training;Functional  mobility training;Therapeutic activities;Therapeutic exercise;Balance training;Neuromuscular re-education;Manual techniques;Patient/family education;Passive range of motion;Taping;Vasopneumatic Device    PT Next Visit Plan review/ update HEP PRN, see PROTOCOL, shoulder PROM, scapular setting, elbow  AROM, vaso for pain/ stiffness PRN,    PT Home Exercise Plan [id number] - pendulums, table slides flexion/ abduction, scapular retraction, upper trap stretch.    Consulted and Agree with Plan of Care Patient           Patient will benefit from skilled therapeutic intervention in order to improve the following deficits and impairments:  Improper body mechanics,Increased muscle spasms,Decreased strength,Postural dysfunction,Pain,Decreased range of motion,Decreased endurance,Decreased activity tolerance,Increased edema  Visit Diagnosis: Chronic right shoulder pain  Muscle weakness (generalized)  Stiffness of right shoulder, not elsewhere classified  Abnormal posture  Localized edema     Problem List Patient Active Problem List   Diagnosis Date Noted  . Vitamin D deficiency 01/04/2017  . Breast cancer of upper-inner quadrant of left female breast ([place]) 09/02/2016  . Thrombocytopenia ([place]) 10/09/2015  . Lymphedema of breast 10/09/2015  . Genetic testing 09/23/2015  . Peripheral neuropathy due to chemotherapy ([place]) 08/14/2014  . Cellulitis of chest wall 08/01/2014  . Port-A-Cath in place 08/01/2014  . Altered mental status 07/29/2014  . Transaminasemia 07/29/2014  . Conjunctivitis 07/29/2014  . Deafness congenital 03/20/2014  . History of right breast cancer 01/11/2014   Starr Lake PT, DPT, LAT, ATC  03/12/21  3:45 PM      [facility] [address] [place], [place], [address] Phone: [phone]   Fax:  [phone]  Name: Raylei Losurdo MRN: [id number] Date of Birth: [birth year]

## 2021-03-14 ENCOUNTER — Other Ambulatory Visit: Payer: Self-pay

## 2021-03-14 ENCOUNTER — Encounter: Payer: Self-pay | Admitting: Physical Therapy

## 2021-03-14 ENCOUNTER — Ambulatory Visit: Payer: BC Managed Care – PPO | Admitting: Physical Therapy

## 2021-03-14 DIAGNOSIS — G8929 Other chronic pain: Secondary | ICD-10-CM

## 2021-03-14 DIAGNOSIS — M6281 Muscle weakness (generalized): Secondary | ICD-10-CM

## 2021-03-14 DIAGNOSIS — M25511 Pain in right shoulder: Secondary | ICD-10-CM | POA: Diagnosis not present

## 2021-03-14 DIAGNOSIS — M25611 Stiffness of right shoulder, not elsewhere classified: Secondary | ICD-10-CM

## 2021-03-14 DIAGNOSIS — R600 Localized edema: Secondary | ICD-10-CM

## 2021-03-14 DIAGNOSIS — R293 Abnormal posture: Secondary | ICD-10-CM

## 2021-03-14 NOTE — Therapy (Signed)
[place], [place], [address] Phone: [phone]   Fax:  [phone]  Physical Therapy Treatment  Patient Details  Name: Regina Watts MRN: [id number] Date of Birth: 07/06/1961 Referring Provider (PT): Tania Ade   Encounter Date: 03/14/2021   PT End of Session - 03/14/21 1100    Visit Number 7    Number of Visits 17    Date for PT Re-Evaluation 05/12/21    Authorization Type BCBS FOTO 10th visit.    Progress Note Due on Visit 10    PT Start Time 1100    PT Stop Time 1149    PT Time Calculation (min) 49 min    Activity Tolerance Patient limited by pain;Patient tolerated treatment well    Behavior During Therapy [facility] for tasks assessed/performed           Past Medical History:  Diagnosis Date  . Breast cancer ([place]) JANUARY 2002   RIGHT BREAST T4 SENTINEL NODE NEGATIVE, HER2 3+, ER/PR NEGATIVE  . Breast cancer [facility]) February 2015   Left Breast T1N0  . Congenital deafness   . Hx antineoplastic chemo   . Hx of radiation therapy   . Hyperlipidemia   . Personal history of radiation therapy   . Wears glasses    to drive    Past Surgical History:  Procedure Laterality Date  . AUGMENTATION MAMMAPLASTY Left    breast lift   . BREAST LUMPECTOMY Left 03/06/2014  . BREAST LUMPECTOMY Right 11/2000  . BREAST LUMPECTOMY WITH NEEDLE LOCALIZATION AND AXILLARY SENTINEL LYMPH NODE BX Left 03/06/2014   Procedure: BREAST LUMPECTOMY WITH NEEDLE LOCALIZATION AND AXILLARY SENTINEL LYMPH NODE BIOPSY;  Surgeon: Shann Medal, MD;  Location: [place];  Service: General;  Laterality: Left;  . BREAST SURGERY  2002   lumpectomy on right side  . CESAREAN SECTION    . MASTOPEXY Left 09/07/2017   Procedure: LEFT BREAST MASTOPEXY;  Surgeon: Irene Limbo, MD;  Location: [employer];  Service: Plastics;  Laterality: Left;  . PORT-A-CATH REMOVAL Right 08/03/2014   Procedure:  REMOVAL PORT-A-CATH;  Surgeon: Jackolyn Confer, MD;  [facility];  Service: General;  Laterality: Right;  . PORTACATH PLACEMENT Right 03/06/2014   Procedure: INSERTION PORT-A-CATH;  Surgeon: Shann Medal, MD;  Location: [place];  Service: General;  Laterality: Right;  subclavian  . ROTATOR CUFF REPAIR Right 02/04/2021  . TONSILLECTOMY      There were no vitals filed for this visit.   Subjective Assessment - 03/14/21 1103    Subjective "I am feeling alittle sore after last night and this morning."    Patient Stated Goals to get stronger    Currently in Pain? Yes    Pain Score 3     Pain Orientation Right    Pain Descriptors / Indicators Aching    Pain Type Chronic pain    Pain Onset More than a month ago    Pain Frequency Intermittent              [facility] PT Assessment - 03/14/21 0001      Assessment   Medical Diagnosis R RCR    Referring Provider (PT) Tania Ade                         [facility] Adult PT Treatment/Exercise - 03/14/21 0001      Shoulder Exercises: Supine   Protraction AAROM;Both;12 reps  with dowel rod   Other Supine Exercises Dowel AAROM flexion ER to neutral x10   with elbows bent for short lever     Shoulder Exercises: Isometric Strengthening   Flexion 5X5"   x 2 sets in supine with manual resistance   Extension 5X5"   x 2 sets in supine with manual resistance   External Rotation 5X5"   x 2 sets in supine with manual resistance   Internal Rotation 5X5"   x 2 sets in supine with manual resistance     Vasopneumatic   Number Minutes Vasopneumatic  10 minutes    Vasopnuematic Location  Shoulder    Vasopneumatic Pressure Low    Vasopneumatic Temperature  34      Manual Therapy   Joint Mobilization grade 3 inf/post and anteriorGHJ glides    Scapular Mobilization scapular mobs in all palnes    Passive ROM shoulder flexion/ abduction with gentle ossiclations to calm down pain                  PT  Education - 03/14/21 1136    Education Details reviewed HEp and updated for supineAAROM with dowel rod    Person(s) Educated Patient    Methods Explanation;Verbal cues;Handout    Comprehension Verbalized understanding;Verbal cues required            PT Short Term Goals - 02/17/21 1416      PT SHORT TERM GOAL #1   Title pt to be IND with inital HEP    Time 4    Period Weeks    Status New    Target Date 03/17/21      PT SHORT TERM GOAL #2   Title increase R shoulder AAROM to >/= 90 degrees flexion / abduction for therapuetic progression    Time 4    Period Weeks    Status New    Target Date 03/17/21      PT SHORT TERM GOAL #3   Title pt to verbalize/ demo efficient posture to reduce and prevent R shoulder pain    Time 4    Period Weeks    Status New    Target Date 03/17/21             PT Long Term Goals - 02/17/21 1442      PT LONG TERM GOAL #1   Title increase R shoulder AROM flexion/ abduction to >/= 120 degrees and IR/ER WFL compared bil for functional ROM required for ADLs with </=2/10 max pain    Time 8    Period Weeks    Status New    Target Date 04/14/21      PT LONG TERM GOAL #2   Title increase R shoulder gross strengthe to >/= 4+/5 to promote scapular stability    Time 8    Period Weeks    Status New    Target Date 04/14/21      PT LONG TERM GOAL #3   Title pt to be able to lift and / lower >/= 15# from floor<>waist, and 8# to and  from waist to overhead for functional ROM And strength    Time 8    Period Weeks    Status New    Target Date 04/14/21      PT LONG TERM GOAL #4   Title increase FOTO score to >/= 67% to demo improvement in function    Time 8    Period Weeks    Status New  Target Date 04/14/21      PT LONG TERM GOAL #5   Title pt to be IND with all HEP given and will be able to maintain and progress current LOF IND    Time 8    Period Weeks    Status New    Target Date 04/14/21                 Plan - 03/14/21  1137    Clinical Impression Statement continued working on shoulder mobs and PROm progressing with aAROM in supine using dowel rod which she responded well, and updated HEP to include these. trialed supine isometrics which she did very well with, plan to update next session. continued using vaso end of session to calm down pain/ soreness.    PT Treatment/Interventions ADLs/Self Care Home Management;Cryotherapy;Stair training;Functional mobility training;Therapeutic activities;Therapeutic exercise;Balance training;Neuromuscular re-education;Manual techniques;Patient/family education;Passive range of motion;Taping;Vasopneumatic Device    PT Next Visit Plan review/ update HEP PRN, see PROTOCOL, shoulder PROM, scapular setting, elbow AROM, vaso for pain/ stiffness PRN,    PT Home Exercise Plan [id number] - pendulums, table slides flexion/ abduction, scapular retraction, upper trap stretch. dowel press and supine flexion AAROm    Consulted and Agree with Plan of Care Patient           Patient will benefit from skilled therapeutic intervention in order to improve the following deficits and impairments:  Improper body mechanics,Increased muscle spasms,Decreased strength,Postural dysfunction,Pain,Decreased range of motion,Decreased endurance,Decreased activity tolerance,Increased edema  Visit Diagnosis: Chronic right shoulder pain  Muscle weakness (generalized)  Stiffness of right shoulder, not elsewhere classified  Abnormal posture  Localized edema     Problem List Patient Active Problem List   Diagnosis Date Noted  . Vitamin D deficiency 01/04/2017  . Breast cancer of upper-inner quadrant of left female breast ([place]) 09/02/2016  . Thrombocytopenia ([place]) 10/09/2015  . Lymphedema of breast 10/09/2015  . Genetic testing 09/23/2015  . Peripheral neuropathy due to chemotherapy ([place]) 08/14/2014  . Cellulitis of chest wall 08/01/2014  . Port-A-Cath in place 08/01/2014  . Altered mental status  07/29/2014  . Transaminasemia 07/29/2014  . Conjunctivitis 07/29/2014  . Deafness congenital 03/20/2014  . History of right breast cancer 01/11/2014    Starr Lake 03/14/2021, 11:42 AM  [place], [place], [address] Phone: [phone]   Fax:  [phone]  Name: Regina Watts MRN: [id number] Date of Birth: 27-Dec-1960

## 2021-03-18 ENCOUNTER — Encounter: Payer: BC Managed Care – PPO | Admitting: Physical Therapy

## 2021-03-20 ENCOUNTER — Other Ambulatory Visit: Payer: Self-pay

## 2021-03-20 ENCOUNTER — Ambulatory Visit: Payer: BC Managed Care – PPO | Admitting: Physical Therapy

## 2021-03-20 ENCOUNTER — Encounter: Payer: Self-pay | Admitting: Physical Therapy

## 2021-03-20 DIAGNOSIS — M6281 Muscle weakness (generalized): Secondary | ICD-10-CM

## 2021-03-20 DIAGNOSIS — M25611 Stiffness of right shoulder, not elsewhere classified: Secondary | ICD-10-CM

## 2021-03-20 DIAGNOSIS — R600 Localized edema: Secondary | ICD-10-CM

## 2021-03-20 DIAGNOSIS — M25511 Pain in right shoulder: Secondary | ICD-10-CM | POA: Diagnosis not present

## 2021-03-20 DIAGNOSIS — G8929 Other chronic pain: Secondary | ICD-10-CM

## 2021-03-20 DIAGNOSIS — R293 Abnormal posture: Secondary | ICD-10-CM

## 2021-03-20 NOTE — Therapy (Signed)
[place], [place], [address] Phone: [phone]   Fax:  [phone]  Physical Therapy Treatment  Patient Details  Name: Regina Watts MRN: [id number] Date of Birth: April 19, 1961 Referring Provider (PT): Tania Ade   Encounter Date: 03/20/2021   PT End of Session - 03/20/21 1331    Visit Number 8    Number of Visits 17    Date for PT Re-Evaluation 05/12/21    Authorization Type BCBS FOTO 10th visit.    Progress Note Due on Visit 10    PT Start Time 1331    PT Stop Time 1420    PT Time Calculation (min) 49 min    Activity Tolerance Patient limited by pain;Patient tolerated treatment well    Behavior During Therapy [facility] for tasks assessed/performed           Past Medical History:  Diagnosis Date  . Breast cancer ([place]) JANUARY 2002   RIGHT BREAST T4 SENTINEL NODE NEGATIVE, HER2 3+, ER/PR NEGATIVE  . Breast cancer [facility]) February 2015   Left Breast T1N0  . Congenital deafness   . Hx antineoplastic chemo   . Hx of radiation therapy   . Hyperlipidemia   . Personal history of radiation therapy   . Wears glasses    to drive    Past Surgical History:  Procedure Laterality Date  . AUGMENTATION MAMMAPLASTY Left    breast lift   . BREAST LUMPECTOMY Left 03/06/2014  . BREAST LUMPECTOMY Right 11/2000  . BREAST LUMPECTOMY WITH NEEDLE LOCALIZATION AND AXILLARY SENTINEL LYMPH NODE BX Left 03/06/2014   Procedure: BREAST LUMPECTOMY WITH NEEDLE LOCALIZATION AND AXILLARY SENTINEL LYMPH NODE BIOPSY;  Surgeon: Shann Medal, MD;  Location: [place];  Service: General;  Laterality: Left;  . BREAST SURGERY  2002   lumpectomy on right side  . CESAREAN SECTION    . MASTOPEXY Left 09/07/2017   Procedure: LEFT BREAST MASTOPEXY;  Surgeon: Irene Limbo, MD;  Location: [place];  Service: Plastics;  Laterality: Left;  . PORT-A-CATH REMOVAL Right 08/03/2014   Procedure:  REMOVAL PORT-A-CATH;  Surgeon: Jackolyn Confer, MD;  [facility];  Service: General;  Laterality: Right;  . PORTACATH PLACEMENT Right 03/06/2014   Procedure: INSERTION PORT-A-CATH;  Surgeon: Shann Medal, MD;  Location: [place];  Service: General;  Laterality: Right;  subclavian  . ROTATOR CUFF REPAIR Right 02/04/2021  . TONSILLECTOMY      There were no vitals filed for this visit.   Subjective Assessment - 03/20/21 1332    Subjective "The shoulder is about the same, i am still having somet tightness. it is doing fine keeping out of the sling. I was told by the MD not to do any testing at work.    Patient Stated Goals to get stronger    Currently in Pain? Yes    Pain Score 3               [facility] PT Assessment - 03/20/21 0001      Assessment   Medical Diagnosis R RCR    Referring Provider (PT) Tania Ade                         [facility] Adult PT Treatment/Exercise - 03/20/21 0001      Shoulder Exercises: Pulleys   Flexion 2 minutes    Scaption 2 minutes      Shoulder Exercises: Isometric Strengthening  Flexion --   1 x 10 standing   Extension --   1 x 10 standing   External Rotation --   1 x 10 standing   Internal Rotation --   1 x 10 standing     Vasopneumatic   Number Minutes Vasopneumatic  10 minutes    Vasopnuematic Location  Shoulder    Vasopneumatic Pressure Medium    Vasopneumatic Temperature  34      Manual Therapy   Manual therapy comments MPTR along the R upper trap and middle deltoid.    Joint Mobilization grade 3 inf/post and anteriorGHJ glides    Scapular Mobilization scapular mobs in all palnes                    PT Short Term Goals - 02/17/21 1416      PT SHORT TERM GOAL #1   Title pt to be IND with inital HEP    Time 4    Period Weeks    Status New    Target Date 03/17/21      PT SHORT TERM GOAL #2   Title increase R shoulder AAROM to >/= 90 degrees flexion / abduction for therapuetic  progression    Time 4    Period Weeks    Status New    Target Date 03/17/21      PT SHORT TERM GOAL #3   Title pt to verbalize/ demo efficient posture to reduce and prevent R shoulder pain    Time 4    Period Weeks    Status New    Target Date 03/17/21             PT Long Term Goals - 02/17/21 1442      PT LONG TERM GOAL #1   Title increase R shoulder AROM flexion/ abduction to >/= 120 degrees and IR/ER WFL compared bil for functional ROM required for ADLs with </=2/10 max pain    Time 8    Period Weeks    Status New    Target Date 04/14/21      PT LONG TERM GOAL #2   Title increase R shoulder gross strengthe to >/= 4+/5 to promote scapular stability    Time 8    Period Weeks    Status New    Target Date 04/14/21      PT LONG TERM GOAL #3   Title pt to be able to lift and / lower >/= 15# from floor<>waist, and 8# to and  from waist to overhead for functional ROM And strength    Time 8    Period Weeks    Status New    Target Date 04/14/21      PT LONG TERM GOAL #4   Title increase FOTO score to >/= 67% to demo improvement in function    Time 8    Period Weeks    Status New    Target Date 04/14/21      PT LONG TERM GOAL #5   Title pt to be IND with all HEP given and will be able to maintain and progress current LOF IND    Time 8    Period Weeks    Status New    Target Date 04/14/21                 Plan - 03/20/21 1414    Clinical Impression Statement continued working on shoulder mobs  and AAROM to promote shoulder mobility. progressed  HEP to include standing shoulder isometrics to promote strengthening. continued utilzing vaso end of session to calm down soreness following session.    PT Treatment/Interventions ADLs/Self Care Home Management;Cryotherapy;Stair training;Functional mobility training;Therapeutic activities;Therapeutic exercise;Balance training;Neuromuscular re-education;Manual techniques;Patient/family education;Passive range of  motion;Taping;Vasopneumatic Device    PT Next Visit Plan review/ update HEP PRN, see PROTOCOL, shoulder PROM, scapular setting, elbow AROM, vaso for pain/ stiffness PRN,    PT Home Exercise Plan [id number] - pendulums, table slides flexion/ abduction, scapular retraction, upper trap stretch. dowel press and supine flexion AAROm    Consulted and Agree with Plan of Care Patient           Patient will benefit from skilled therapeutic intervention in order to improve the following deficits and impairments:  Improper body mechanics,Increased muscle spasms,Decreased strength,Postural dysfunction,Pain,Decreased range of motion,Decreased endurance,Decreased activity tolerance,Increased edema  Visit Diagnosis: Chronic right shoulder pain  Muscle weakness (generalized)  Stiffness of right shoulder, not elsewhere classified  Abnormal posture  Localized edema     Problem List Patient Active Problem List   Diagnosis Date Noted  . Vitamin D deficiency 01/04/2017  . Breast cancer of upper-inner quadrant of left female breast ([place]) 09/02/2016  . Thrombocytopenia ([place]) 10/09/2015  . Lymphedema of breast 10/09/2015  . Genetic testing 09/23/2015  . Peripheral neuropathy due to chemotherapy ([place]) 08/14/2014  . Cellulitis of chest wall 08/01/2014  . Port-A-Cath in place 08/01/2014  . Altered mental status 07/29/2014  . Transaminasemia 07/29/2014  . Conjunctivitis 07/29/2014  . Deafness congenital 03/20/2014  . History of right breast cancer 01/11/2014   Starr Lake PT, DPT, LAT, ATC  03/20/21  2:17 PM      [facility] [address] [place], [place], [address] Phone: [phone]   Fax:  [phone]  Name: Regina Watts MRN: [id number] Date of Birth: [birth year]

## 2021-03-25 ENCOUNTER — Encounter: Payer: Self-pay | Admitting: Physical Therapy

## 2021-03-25 ENCOUNTER — Other Ambulatory Visit: Payer: Self-pay

## 2021-03-25 ENCOUNTER — Ambulatory Visit: Payer: BC Managed Care – PPO | Admitting: Physical Therapy

## 2021-03-25 DIAGNOSIS — M25611 Stiffness of right shoulder, not elsewhere classified: Secondary | ICD-10-CM

## 2021-03-25 DIAGNOSIS — G8929 Other chronic pain: Secondary | ICD-10-CM

## 2021-03-25 DIAGNOSIS — M25511 Pain in right shoulder: Secondary | ICD-10-CM | POA: Diagnosis not present

## 2021-03-25 DIAGNOSIS — R293 Abnormal posture: Secondary | ICD-10-CM

## 2021-03-25 DIAGNOSIS — M6281 Muscle weakness (generalized): Secondary | ICD-10-CM

## 2021-03-25 DIAGNOSIS — R600 Localized edema: Secondary | ICD-10-CM

## 2021-03-25 NOTE — Therapy (Signed)
[place], [place], [address] Phone: [phone]   Fax:  [phone]  Physical Therapy Treatment  Patient Details  Name: Regina Watts MRN: [id number] Date of Birth: February 28, 1961 Referring Provider (PT): Tania Ade   Encounter Date: 03/25/2021   PT End of Session - 03/25/21 1107    Visit Number 9    Number of Visits 17    Date for PT Re-Evaluation 05/12/21    Authorization Type [employer] FOTO 10th visit.    Progress Note Due on Visit 10    PT Start Time 1106    PT Stop Time 1155    PT Time Calculation (min) 49 min    Activity Tolerance Patient limited by pain;Patient tolerated treatment well    Behavior During Therapy [facility] for tasks assessed/performed           Past Medical History:  Diagnosis Date  . Breast cancer ([place]) JANUARY 2002   RIGHT BREAST T4 SENTINEL NODE NEGATIVE, HER2 3+, ER/PR NEGATIVE  . Breast cancer [facility]) February 2015   Left Breast T1N0  . Congenital deafness   . Hx antineoplastic chemo   . Hx of radiation therapy   . Hyperlipidemia   . Personal history of radiation therapy   . Wears glasses    to drive    Past Surgical History:  Procedure Laterality Date  . AUGMENTATION MAMMAPLASTY Left    breast lift   . BREAST LUMPECTOMY Left 03/06/2014  . BREAST LUMPECTOMY Right 11/2000  . BREAST LUMPECTOMY WITH NEEDLE LOCALIZATION AND AXILLARY SENTINEL LYMPH NODE BX Left 03/06/2014   Procedure: BREAST LUMPECTOMY WITH NEEDLE LOCALIZATION AND AXILLARY SENTINEL LYMPH NODE BIOPSY;  Surgeon: Shann Medal, MD;  Location: [place];  Service: General;  Laterality: Left;  . BREAST SURGERY  2002   lumpectomy on right side  . CESAREAN SECTION    . MASTOPEXY Left 09/07/2017   Procedure: LEFT BREAST MASTOPEXY;  Surgeon: Irene Limbo, MD;  Location: [place];  Service: Plastics;  Laterality: Left;  . PORT-A-CATH REMOVAL Right 08/03/2014   Procedure:  REMOVAL PORT-A-CATH;  Surgeon: Jackolyn Confer, MD;  [facility];  Service: General;  Laterality: Right;  . PORTACATH PLACEMENT Right 03/06/2014   Procedure: INSERTION PORT-A-CATH;  Surgeon: Shann Medal, MD;  Location: [place];  Service: General;  Laterality: Right;  subclavian  . ROTATOR CUFF REPAIR Right 02/04/2021  . TONSILLECTOMY      There were no vitals filed for this visit.   Subjective Assessment - 03/25/21 1111    Subjective " the pain is off and on, I am a little sore which I think could be from the weather."    Currently in Pain? Yes    Pain Score 3               [facility] PT Assessment - 03/25/21 0001      Assessment   Medical Diagnosis R RCR    Referring Provider (PT) Tania Ade                         [facility] Adult PT Treatment/Exercise - 03/25/21 0001      Elbow Exercises   Elbow Flexion Seated   bicep curl 2 x 10 3#, tricep extension 2 x 10 with red theraband.     Shoulder Exercises: Standing   Other Standing Exercises wand flexion 2 x 10      Shoulder  Exercises: Pulleys   Flexion 3 minutes    Scaption 3 minutes      Shoulder Exercises: ROM/Strengthening   Other ROM/Strengthening Exercises wall walks 2 x 5 flexion only      Vasopneumatic   Number Minutes Vasopneumatic  10 minutes    Vasopnuematic Location  Shoulder    Vasopneumatic Pressure Medium    Vasopneumatic Temperature  34      Manual Therapy   Manual therapy comments MPTR along the R upper trap and middle deltoid.                  PT Education - 03/25/21 1142    Education Details updated HEp for wand    Person(s) Educated Patient    Methods Verbal cues;Handout;Explanation    Comprehension Verbalized understanding;Verbal cues required            PT Short Term Goals - 02/17/21 1416      PT SHORT TERM GOAL #1   Title pt to be IND with inital HEP    Time 4    Period Weeks    Status New    Target Date 03/17/21      PT SHORT TERM  GOAL #2   Title increase R shoulder AAROM to >/= 90 degrees flexion / abduction for therapuetic progression    Time 4    Period Weeks    Status New    Target Date 03/17/21      PT SHORT TERM GOAL #3   Title pt to verbalize/ demo efficient posture to reduce and prevent R shoulder pain    Time 4    Period Weeks    Status New    Target Date 03/17/21             PT Long Term Goals - 02/17/21 1442      PT LONG TERM GOAL #1   Title increase R shoulder AROM flexion/ abduction to >/= 120 degrees and IR/ER WFL compared bil for functional ROM required for ADLs with </=2/10 max pain    Time 8    Period Weeks    Status New    Target Date 04/14/21      PT LONG TERM GOAL #2   Title increase R shoulder gross strengthe to >/= 4+/5 to promote scapular stability    Time 8    Period Weeks    Status New    Target Date 04/14/21      PT LONG TERM GOAL #3   Title pt to be able to lift and / lower >/= 15# from floor<>waist, and 8# to and  from waist to overhead for functional ROM And strength    Time 8    Period Weeks    Status New    Target Date 04/14/21      PT LONG TERM GOAL #4   Title increase FOTO score to >/= 67% to demo improvement in function    Time 8    Period Weeks    Status New    Target Date 04/14/21      PT LONG TERM GOAL #5   Title pt to be IND with all HEP given and will be able to maintain and progress current LOF IND    Time 8    Period Weeks    Status New    Target Date 04/14/21                 Plan - 03/25/21 1143  Clinical Impression Statement pt continues to report R shoulder pain but notes it is more intermittent. Focoused session on AAROM for the shoulder which she continues to exhibit upper trap dominance. she did well with exercises noting soreness end of session. Continued use of  vaso end of session.    PT Treatment/Interventions ADLs/Self Care Home Management;Cryotherapy;Stair training;Functional mobility training;Therapeutic  activities;Therapeutic exercise;Balance training;Neuromuscular re-education;Manual techniques;Patient/family education;Passive range of motion;Taping;Vasopneumatic Device    PT Next Visit Plan review/ update HEP PRN, see PROTOCOL, shoulder PROM, scapular setting, elbow AROM, vaso for pain/ stiffness PRN,    PT Home Exercise Plan [id number] - pendulums, table slides flexion/ abduction, scapular retraction, upper trap stretch. dowel press and supine flexion AAROm    Consulted and Agree with Plan of Care Patient           Patient will benefit from skilled therapeutic intervention in order to improve the following deficits and impairments:  Improper body mechanics,Increased muscle spasms,Decreased strength,Postural dysfunction,Pain,Decreased range of motion,Decreased endurance,Decreased activity tolerance,Increased edema  Visit Diagnosis: Abnormal posture  Chronic right shoulder pain  Muscle weakness (generalized)  Stiffness of right shoulder, not elsewhere classified  Localized edema     Problem List Patient Active Problem List   Diagnosis Date Noted  . Vitamin D deficiency 01/04/2017  . Breast cancer of upper-inner quadrant of left female breast ([place]) 09/02/2016  . Thrombocytopenia ([place]) 10/09/2015  . Lymphedema of breast 10/09/2015  . Genetic testing 09/23/2015  . Peripheral neuropathy due to chemotherapy ([place]) 08/14/2014  . Cellulitis of chest wall 08/01/2014  . Port-A-Cath in place 08/01/2014  . Altered mental status 07/29/2014  . Transaminasemia 07/29/2014  . Conjunctivitis 07/29/2014  . Deafness congenital 03/20/2014  . History of right breast cancer 01/11/2014   Starr Lake PT, DPT, LAT, ATC  03/25/21  12:04 PM      [facility] [address] [place], [place], [address] Phone: [phone]   Fax:  [phone]  Name: Regina Watts MRN: [id number] Date of Birth: [birth year]

## 2021-03-27 ENCOUNTER — Other Ambulatory Visit: Payer: Self-pay

## 2021-03-27 ENCOUNTER — Encounter: Payer: Self-pay | Admitting: Physical Therapy

## 2021-03-27 ENCOUNTER — Ambulatory Visit: Payer: BC Managed Care – PPO | Admitting: Physical Therapy

## 2021-03-27 DIAGNOSIS — R293 Abnormal posture: Secondary | ICD-10-CM

## 2021-03-27 DIAGNOSIS — G8929 Other chronic pain: Secondary | ICD-10-CM

## 2021-03-27 DIAGNOSIS — M25611 Stiffness of right shoulder, not elsewhere classified: Secondary | ICD-10-CM

## 2021-03-27 DIAGNOSIS — M25511 Pain in right shoulder: Secondary | ICD-10-CM

## 2021-03-27 DIAGNOSIS — R600 Localized edema: Secondary | ICD-10-CM

## 2021-03-27 DIAGNOSIS — M6281 Muscle weakness (generalized): Secondary | ICD-10-CM

## 2021-03-27 NOTE — Therapy (Addendum)
[place], [place], [address] Phone: [phone]   Fax:  [phone]  Physical Therapy Treatment Progress Note Reporting Period 02/17/2021 to 03/27/2021  See note below for Objective Data and Assessment of Progress/Goals.       Patient Details  Name: Regina Watts MRN: [id number] Date of Birth: [birth year] Referring Provider (PT): Tania Ade   Encounter Date: 03/27/2021   PT End of Session - 03/27/21 1336    Visit Number 10    Number of Visits 17    Date for PT Re-Evaluation 05/12/21    Authorization Type [employer] FOTO 10th visit.    Progress Note Due on Visit 20    PT Start Time 1332    PT Stop Time 1421    PT Time Calculation (min) 49 min    Activity Tolerance Patient limited by pain;Patient tolerated treatment well    Behavior During Therapy [facility] for tasks assessed/performed           Past Medical History:  Diagnosis Date  . Breast cancer ([place]) JANUARY 2002   RIGHT BREAST T4 SENTINEL NODE NEGATIVE, HER2 3+, ER/PR NEGATIVE  . Breast cancer [facility]) February 2015   Left Breast T1N0  . Congenital deafness   . Hx antineoplastic chemo   . Hx of radiation therapy   . Hyperlipidemia   . Personal history of radiation therapy   . Wears glasses    to drive    Past Surgical History:  Procedure Laterality Date  . AUGMENTATION MAMMAPLASTY Left    breast lift   . BREAST LUMPECTOMY Left 03/06/2014  . BREAST LUMPECTOMY Right 11/2000  . BREAST LUMPECTOMY WITH NEEDLE LOCALIZATION AND AXILLARY SENTINEL LYMPH NODE BX Left 03/06/2014   Procedure: BREAST LUMPECTOMY WITH NEEDLE LOCALIZATION AND AXILLARY SENTINEL LYMPH NODE BIOPSY;  Surgeon: Shann Medal, MD;  Location: [place];  Service: General;  Laterality: Left;  . BREAST SURGERY  2002   lumpectomy on right side  . CESAREAN SECTION    . MASTOPEXY Left 09/07/2017   Procedure: LEFT BREAST MASTOPEXY;  Surgeon: Irene Limbo, MD;   Location: [place];  Service: Plastics;  Laterality: Left;  . PORT-A-CATH REMOVAL Right 08/03/2014   Procedure: REMOVAL PORT-A-CATH;  Surgeon: Jackolyn Confer, MD;  [facility];  Service: General;  Laterality: Right;  . PORTACATH PLACEMENT Right 03/06/2014   Procedure: INSERTION PORT-A-CATH;  Surgeon: Shann Medal, MD;  Location: [place];  Service: General;  Laterality: Right;  subclavian  . ROTATOR CUFF REPAIR Right 02/04/2021  . TONSILLECTOMY      There were no vitals filed for this visit.   Subjective Assessment - 03/27/21 1338    Subjective " i am having some soreness in the top of my shoulder and but I do think it is getting better."    Currently in Pain? Yes    Pain Score 3     Pain Location Shoulder    Pain Orientation Right    Pain Descriptors / Indicators Aching    Pain Type Chronic pain              [facility] PT Assessment - 03/27/21 0001      Assessment   Medical Diagnosis R RCR    Referring Provider (PT) Tania Ade      Observation/Other Assessments   Focus on Therapeutic Outcomes (FOTO)  55%      AROM   Right Shoulder Extension 38 Degrees  Right Shoulder Flexion 52 Degrees    Right Shoulder ABduction 60 Degrees    Right Shoulder Internal Rotation --   parietal bone   Right Shoulder External Rotation --   R SIJ   Left Shoulder Flexion 97 Degrees    Left Shoulder ABduction 73 Degrees                         [facility] Adult PT Treatment/Exercise - 03/27/21 0001      Shoulder Exercises: Supine   Flexion AAROM;10 reps;Both   with hands clasped together.     Shoulder Exercises: Seated   Other Seated Exercises self shoulder distraction mobs holding onto table with RUE.      Vasopneumatic   Number Minutes Vasopneumatic  10 minutes    Vasopnuematic Location  Shoulder    Vasopneumatic Pressure Medium    Vasopneumatic Temperature  34      Manual Therapy   Manual therapy comments MPTR along the R upper  trap and middle deltoid. and along the biceps    Joint Mobilization grade 3 inf/post and anteriorGHJ glides, distal clavicle mobs grade III inferior/ posterifor    Passive ROM shoulder flexion/ abduction with gentle ossiclations to calm down pain   continued guarding noted at end range                 PT Education - 03/27/21 1419    Education Details reviewed HEP and discussed importance of doing her exercise and pushing the exercise to promote the mobility of the shoulder.    Person(s) Educated Patient    Methods Explanation;Verbal cues;Handout    Comprehension Verbalized understanding;Verbal cues required            PT Short Term Goals - 03/27/21 1405      PT SHORT TERM GOAL #1   Title pt to be IND with inital HEP    Period Weeks    Status Achieved      PT SHORT TERM GOAL #2   Title increase R shoulder AAROM to >/= 90 degrees flexion / abduction for therapuetic progression    Period Weeks    Status Partially Met      PT SHORT TERM GOAL #3   Title pt to verbalize/ demo efficient posture to reduce and prevent R shoulder pain    Period Weeks    Status Achieved             PT Long Term Goals - 03/27/21 1405      PT LONG TERM GOAL #1   Title increase R shoulder AROM flexion/ abduction to >/= 120 degrees and IR/ER WFL compared bil for functional ROM required for ADLs with </=2/10 max pain    Status On-going    Target Date 04/14/21      PT LONG TERM GOAL #2   Title increase R shoulder gross strengthe to >/= 4+/5 to promote scapular stability    Baseline unable to assess    Period Weeks    Status On-going    Target Date 04/14/21      PT LONG TERM GOAL #3   Title pt to be able to lift and / lower >/= 15# from floor<>waist, and 8# to and  from waist to overhead for functional ROM And strength    Period Weeks    Status On-going    Target Date 04/14/21      PT LONG TERM GOAL #4   Title increase FOTO score to >/=  67% to demo improvement in function    Period  Weeks      PT LONG TERM GOAL #5   Title pt to be IND with all HEP given and will be able to maintain and progress current LOF IND                 Plan - 03/27/21 1415    Clinical Impression Statement [place] continues to make some progress with physical therapy increasing shoulder AROM but continues to demonstrated guarding with Shoulder PROM/ AAROM limiting ROM progression. Focsued todays session o GHJ and distal clavicle mobs to promote ROM, and PROM adn AAROM. She continues to exhibit guarding with associated with soreness. She would benefit from continued physical therapy to decrease shoulder pain, improve ROM , strength and overall funciton by addressing the deficits noted.    PT Treatment/Interventions ADLs/Self Care Home Management;Cryotherapy;Stair training;Functional mobility training;Therapeutic activities;Therapeutic exercise;Balance training;Neuromuscular re-education;Manual techniques;Patient/family education;Passive range of motion;Taping;Vasopneumatic Device    PT Next Visit Plan review/ update HEP PRN, see PROTOCOL, shoulder PROM, scapular setting, elbow AROM, vaso for pain/ stiffness PRN,    PT Home Exercise Plan [id number] - pendulums, table slides flexion/ abduction, scapular retraction, upper trap stretch. dowel press and supine flexion AAROm    Consulted and Agree with Plan of Care Patient           Patient will benefit from skilled therapeutic intervention in order to improve the following deficits and impairments:  Improper body mechanics,Increased muscle spasms,Decreased strength,Postural dysfunction,Pain,Decreased range of motion,Decreased endurance,Decreased activity tolerance,Increased edema  Visit Diagnosis: Abnormal posture  Chronic right shoulder pain  Muscle weakness (generalized)  Stiffness of right shoulder, not elsewhere classified  Localized edema     Problem List Patient Active Problem List   Diagnosis Date Noted  . Vitamin D deficiency  01/04/2017  . Breast cancer of upper-inner quadrant of left female breast ([place]) 09/02/2016  . Thrombocytopenia ([place]) 10/09/2015  . Lymphedema of breast 10/09/2015  . Genetic testing 09/23/2015  . Peripheral neuropathy due to chemotherapy ([place]) 08/14/2014  . Cellulitis of chest wall 08/01/2014  . Port-A-Cath in place 08/01/2014  . Altered mental status 07/29/2014  . Transaminasemia 07/29/2014  . Conjunctivitis 07/29/2014  . Deafness congenital 03/20/2014  . History of right breast cancer 01/11/2014   Regina Watts PT, DPT, LAT, ATC  03/27/21  3:58 PM      [facility] [address] [place], [place], [address] Phone: [phone]   Fax:  [phone]  Name: Regina Watts MRN: [id number] Date of Birth: 12-24-1960

## 2021-04-01 ENCOUNTER — Ambulatory Visit: Payer: BC Managed Care – PPO | Admitting: Physical Therapy

## 2021-04-03 ENCOUNTER — Other Ambulatory Visit: Payer: Self-pay

## 2021-04-03 ENCOUNTER — Encounter: Payer: Self-pay | Admitting: Physical Therapy

## 2021-04-03 ENCOUNTER — Ambulatory Visit: Payer: BC Managed Care – PPO | Admitting: Physical Therapy

## 2021-04-03 DIAGNOSIS — G8929 Other chronic pain: Secondary | ICD-10-CM

## 2021-04-03 DIAGNOSIS — M25511 Pain in right shoulder: Secondary | ICD-10-CM | POA: Diagnosis not present

## 2021-04-03 DIAGNOSIS — M6281 Muscle weakness (generalized): Secondary | ICD-10-CM

## 2021-04-03 DIAGNOSIS — R293 Abnormal posture: Secondary | ICD-10-CM

## 2021-04-03 DIAGNOSIS — M25611 Stiffness of right shoulder, not elsewhere classified: Secondary | ICD-10-CM

## 2021-04-03 DIAGNOSIS — R600 Localized edema: Secondary | ICD-10-CM

## 2021-04-03 NOTE — Therapy (Addendum)
[place], [place], [address] Phone: [phone]   Fax:  [phone]  Physical Therapy Treatment  Patient Details  Name: Regina Watts MRN: [id number] Date of Birth: 03-04-1961 Referring Provider (PT): Tania Ade   Encounter Date: 04/03/2021   PT End of Session - 04/03/21 1339    Visit Number 11    Number of Visits 17    Date for PT Re-Evaluation 05/12/21    Authorization Type BCBS    PT Start Time 1330    PT Stop Time 1421    PT Time Calculation (min) 51 min    Activity Tolerance Patient limited by pain;Patient tolerated treatment well    Behavior During Therapy [facility] for tasks assessed/performed           Past Medical History:  Diagnosis Date  . Breast cancer ([place]) JANUARY 2002   RIGHT BREAST T4 SENTINEL NODE NEGATIVE, HER2 3+, ER/PR NEGATIVE  . Breast cancer [facility]) February 2015   Left Breast T1N0  . Congenital deafness   . Hx antineoplastic chemo   . Hx of radiation therapy   . Hyperlipidemia   . Personal history of radiation therapy   . Wears glasses    to drive    Past Surgical History:  Procedure Laterality Date  . AUGMENTATION MAMMAPLASTY Left    breast lift   . BREAST LUMPECTOMY Left 03/06/2014  . BREAST LUMPECTOMY Right 11/2000  . BREAST LUMPECTOMY WITH NEEDLE LOCALIZATION AND AXILLARY SENTINEL LYMPH NODE BX Left 03/06/2014   Procedure: BREAST LUMPECTOMY WITH NEEDLE LOCALIZATION AND AXILLARY SENTINEL LYMPH NODE BIOPSY;  Surgeon: Shann Medal, MD;  Location: [place];  Service: General;  Laterality: Left;  . BREAST SURGERY  2002   lumpectomy on right side  . CESAREAN SECTION    . MASTOPEXY Left 09/07/2017   Procedure: LEFT BREAST MASTOPEXY;  Surgeon: Irene Limbo, MD;  Location: [place];  Service: Plastics;  Laterality: Left;  . PORT-A-CATH REMOVAL Right 08/03/2014   Procedure: REMOVAL PORT-A-CATH;  Surgeon: Jackolyn Confer, MD;   [facility];  Service: General;  Laterality: Right;  . PORTACATH PLACEMENT Right 03/06/2014   Procedure: INSERTION PORT-A-CATH;  Surgeon: Shann Medal, MD;  Location: [place];  Service: General;  Laterality: Right;  subclavian  . ROTATOR CUFF REPAIR Right 02/04/2021  . TONSILLECTOMY      There were no vitals filed for this visit.   Subjective Assessment - 04/03/21 1339    Subjective The patient states that she mowed the lawn yesterday and her shoulder is sore from that.  The mower is self propelled, but she did have to start the lawn mower with the right arm.    Patient is accompained by: Interpreter    Currently in Pain? Yes    Pain Score 3     Pain Location Shoulder    Pain Descriptors / Indicators Aching;Other (Comment)   stiffness   Aggravating Factors  working and using the right shoulder    Pain Relieving Factors exercises                             [facility] Adult PT Treatment/Exercise - 04/03/21 0001      Shoulder Exercises: Standing   External Rotation Strengthening;10 reps;Theraband    Theraband Level (Shoulder External Rotation) Level 1 (Yellow)    Internal Rotation Strengthening;Right;10 reps;Theraband    Theraband Level (Shoulder Internal  Rotation) Level 1 (Yellow)    Other Standing Exercises self distraciton holding on sink with posterior stepping/ weight shift      Shoulder Exercises: ROM/Strengthening   Other ROM/Strengthening Exercises wall walks 3 x long lever, 3 x short lever with cues to avoid shoulder hiking.      Modalities   Modalities Moist Heat      Moist Heat Therapy   Number Minutes Moist Heat 10 Minutes    Moist Heat Location Shoulder   seated     Manual Therapy   Manual therapy comments MPTR along the R upper trap and middle deltoid. and along the biceps    Joint Mobilization grade 3 inf/post and anteriorGHJ glides, distal clavicle mobs grade III inferior/ posterifor    Scapular Mobilization all planes,  except elevation grade 3   in L side lying   Passive ROM shoulder flexion/ abduction with gentle ossiclations to calm down pain   continued guarding noted at end range                   PT Short Term Goals - 04/03/21 1349      PT SHORT TERM GOAL #1   Title pt to be IND with inital HEP    Status Achieved      PT SHORT TERM GOAL #2   Title increase R shoulder AAROM to >/= 90 degrees flexion / abduction for therapuetic progression    Status Partially Met      PT SHORT TERM GOAL #3   Title pt to verbalize/ demo efficient posture to reduce and prevent R shoulder pain    Status Achieved             PT Long Term Goals - 03/27/21 1405      PT LONG TERM GOAL #1   Title increase R shoulder AROM flexion/ abduction to >/= 120 degrees and IR/ER WFL compared bil for functional ROM required for ADLs with </=2/10 max pain    Status On-going    Target Date 04/14/21      PT LONG TERM GOAL #2   Title increase R shoulder gross strengthe to >/= 4+/5 to promote scapular stability    Baseline unable to assess    Period Weeks    Status On-going    Target Date 04/14/21      PT LONG TERM GOAL #3   Title pt to be able to lift and / lower >/= 15# from floor<>waist, and 8# to and  from waist to overhead for functional ROM And strength    Period Weeks    Status On-going    Target Date 04/14/21      PT LONG TERM GOAL #4   Title increase FOTO score to >/= 67% to demo improvement in function    Period Weeks      PT LONG TERM GOAL #5   Title pt to be IND with all HEP given and will be able to maintain and progress current LOF IND                 Plan - 04/03/21 1428    Clinical Impression Statement continued working on shoulder ROM staring with mobs progressing to PROM an AAROM activities. she continues to demonstrate guarding limited shoulder ROM both passively and actively. she did well with shoulder isotonics with yellow band which. trialed MHP today to decrease soreness/  stiffness.    PT Treatment/Interventions ADLs/Self Care Home Management;Cryotherapy;Stair training;Functional mobility training;Therapeutic activities;Therapeutic exercise;Balance training;Neuromuscular  re-education;Manual techniques;Patient/family education;Passive range of motion;Taping;Vasopneumatic Device    PT Next Visit Plan review/ update HEP PRN, see PROTOCOL, shoulder PROM, scapular setting, elbow AROM, vaso for pain/ stiffness PRN,    PT Home Exercise Plan [id number] - pendulums, table slides flexion/ abduction, scapular retraction, upper trap stretch. dowel press and supine flexion AAROm    Consulted and Agree with Plan of Care Patient           Patient will benefit from skilled therapeutic intervention in order to improve the following deficits and impairments:  Improper body mechanics,Increased muscle spasms,Decreased strength,Postural dysfunction,Pain,Decreased range of motion,Decreased endurance,Decreased activity tolerance,Increased edema  Visit Diagnosis: Abnormal posture  Chronic right shoulder pain  Muscle weakness (generalized)  Stiffness of right shoulder, not elsewhere classified  Localized edema     Problem List Patient Active Problem List   Diagnosis Date Noted  . Vitamin D deficiency 01/04/2017  . Breast cancer of upper-inner quadrant of left female breast ([place]) 09/02/2016  . Thrombocytopenia ([place]) 10/09/2015  . Lymphedema of breast 10/09/2015  . Genetic testing 09/23/2015  . Peripheral neuropathy due to chemotherapy ([place]) 08/14/2014  . Cellulitis of chest wall 08/01/2014  . Port-A-Cath in place 08/01/2014  . Altered mental status 07/29/2014  . Transaminasemia 07/29/2014  . Conjunctivitis 07/29/2014  . Deafness congenital 03/20/2014  . History of right breast cancer 01/11/2014    Starr Lake PT, DPT, LAT, ATC  04/03/21  2:33 PM      [facility] [address] [place], [place],  [address] Phone: [phone]   Fax:  [phone]  Name: Joeli Fenner MRN: [id number] Date of Birth: 04/06/1961

## 2021-04-08 ENCOUNTER — Ambulatory Visit: Payer: BC Managed Care – PPO

## 2021-04-08 ENCOUNTER — Other Ambulatory Visit: Payer: Self-pay

## 2021-04-08 DIAGNOSIS — M6281 Muscle weakness (generalized): Secondary | ICD-10-CM

## 2021-04-08 DIAGNOSIS — M25511 Pain in right shoulder: Secondary | ICD-10-CM | POA: Diagnosis not present

## 2021-04-08 DIAGNOSIS — G8929 Other chronic pain: Secondary | ICD-10-CM

## 2021-04-08 DIAGNOSIS — M25611 Stiffness of right shoulder, not elsewhere classified: Secondary | ICD-10-CM

## 2021-04-08 NOTE — Therapy (Signed)
[name], [place], [address] Phone: [phone]   Fax:  [phone]  Physical Therapy Treatment  Patient Details  Name: Regina Watts MRN: [id number] Date of Birth: [birth year] Referring Provider (PT): Tania Ade   Encounter Date: 04/08/2021   PT End of Session - 04/08/21 1550    Visit Number 12    Number of Visits 17    Date for PT Re-Evaluation 05/12/21    Authorization Type BCBS    Progress Note Due on Visit 20    PT Start Time 1545    PT Stop Time 1630    PT Time Calculation (min) 45 min           Past Medical History:  Diagnosis Date  . Breast cancer ([place]) JANUARY 2002   RIGHT BREAST T4 SENTINEL NODE NEGATIVE, HER2 3+, ER/PR NEGATIVE  . Breast cancer [facility]) February 2015   Left Breast T1N0  . Congenital deafness   . Hx antineoplastic chemo   . Hx of radiation therapy   . Hyperlipidemia   . Personal history of radiation therapy   . Wears glasses    to drive    Past Surgical History:  Procedure Laterality Date  . AUGMENTATION MAMMAPLASTY Left    breast lift   . BREAST LUMPECTOMY Left 03/06/2014  . BREAST LUMPECTOMY Right 11/2000  . BREAST LUMPECTOMY WITH NEEDLE LOCALIZATION AND AXILLARY SENTINEL LYMPH NODE BX Left 03/06/2014   Procedure: BREAST LUMPECTOMY WITH NEEDLE LOCALIZATION AND AXILLARY SENTINEL LYMPH NODE BIOPSY;  Surgeon: Shann Medal, MD;  Location: [place];  Service: General;  Laterality: Left;  . BREAST SURGERY  2002   lumpectomy on right side  . CESAREAN SECTION    . MASTOPEXY Left 09/07/2017   Procedure: LEFT BREAST MASTOPEXY;  Surgeon: Irene Limbo, MD;  Location: [place];  Service: Plastics;  Laterality: Left;  . PORT-A-CATH REMOVAL Right 08/03/2014   Procedure: REMOVAL PORT-A-CATH;  Surgeon: Jackolyn Confer, MD;  [facility];  Service: General;  Laterality: Right;  . PORTACATH PLACEMENT Right 03/06/2014    Procedure: INSERTION PORT-A-CATH;  Surgeon: Shann Medal, MD;  Location: [place];  Service: General;  Laterality: Right;  subclavian  . ROTATOR CUFF REPAIR Right 02/04/2021  . TONSILLECTOMY      There were no vitals filed for this visit.   Subjective Assessment - 04/08/21 1547    Subjective Irja reports that her shoulder is about the same as last time.  Maybe little improvements.  She did mow her lawn again.  She has some pain in the upper trapezius area.    Patient is accompained by: Interpreter    Currently in Pain? Yes    Pain Score 4     Pain Location Shoulder    Pain Orientation Right    Pain Descriptors / Indicators Burning;Aching;Throbbing              [facility] PT Assessment - 04/08/21 0001      AROM   Right Shoulder Flexion 65 Degrees   with shoulder shrug     PROM   Right Shoulder Flexion 100 Degrees    Right Shoulder External Rotation 20 Degrees                         [facility] Adult PT Treatment/Exercise - 04/08/21 0001      Shoulder Exercises: Standing   External Rotation Strengthening;Right;10 reps  Theraband Level (Shoulder External Rotation) Level 2 (Red)    Internal Rotation Strengthening;Right;10 reps    Theraband Level (Shoulder Internal Rotation) Level 2 (Red)    Theraband Level (Shoulder Extension) Level 3 (Green)   10 reps, Right AAROM into flexion   Other Standing Exercises self distraciton holding on sink with posterior stepping/ weight shift      Shoulder Exercises: Pulleys   Flexion 3 minutes    Flexion Limitations cuing to reduce shoulder shrug    Scaption 3 minutes    Scaption Limitations verbal cuing to reduce shoulder shrug      Shoulder Exercises: ROM/Strengthening   Other ROM/Strengthening Exercises wall walks 10 x long lever, 10 x short lever with cues to avoid shoulder hiking.      Manual Therapy   Manual therapy comments STM Right UT/levator scapula    Joint Mobilization grade 3 inf/post and  anteriorGHJ glides, grade III R GH lateral distraction    Scapular Mobilization all planes, except elevation grade 3   in L side lying   Passive ROM shoulder flexion, ER weight ossicllations for pain                  PT Education - 04/08/21 1630    Education Details Continue with her current HEP and awareness of shoulder shrugging with reraching.    Person(s) Educated Patient    Methods Explanation;Demonstration    Comprehension Verbalized understanding            PT Short Term Goals - 04/03/21 1349      PT SHORT TERM GOAL #1   Title pt to be IND with inital HEP    Status Achieved      PT SHORT TERM GOAL #2   Title increase R shoulder AAROM to >/= 90 degrees flexion / abduction for therapuetic progression    Status Partially Met      PT SHORT TERM GOAL #3   Title pt to verbalize/ demo efficient posture to reduce and prevent R shoulder pain    Status Achieved             PT Long Term Goals - 03/27/21 1405      PT LONG TERM GOAL #1   Title increase R shoulder AROM flexion/ abduction to >/= 120 degrees and IR/ER WFL compared bil for functional ROM required for ADLs with </=2/10 max pain    Status On-going    Target Date 04/14/21      PT LONG TERM GOAL #2   Title increase R shoulder gross strengthe to >/= 4+/5 to promote scapular stability    Baseline unable to assess    Period Weeks    Status On-going    Target Date 04/14/21      PT LONG TERM GOAL #3   Title pt to be able to lift and / lower >/= 15# from floor<>waist, and 8# to and  from waist to overhead for functional ROM And strength    Period Weeks    Status On-going    Target Date 04/14/21      PT LONG TERM GOAL #4   Title increase FOTO score to >/= 67% to demo improvement in function    Period Weeks      PT LONG TERM GOAL #5   Title pt to be IND with all HEP given and will be able to maintain and progress current LOF IND  Plan - 04/08/21 1633    Clinical Impression  Statement Malone currently has 100 degrees of passive right shoulder flexion motion post mobs and manual stretches.  Her active flexion is limited to 65 degrees with compensation of shoulder shrugging.  The patient continues to have difficulty with reaching over shoulder level with her right upper extremity.  Added Tband shoulder extension with AAROM into flexion.  The pt did well controling the scapular and reducing her shoulder shrug with this exercise.  Recommend continued strengthening and improvements of functional range of motion for self care and household tasks.    Examination-Activity Limitations Lift;Reach Overhead;Dressing;Bathing    PT Treatment/Interventions ADLs/Self Care Home Management;Cryotherapy;Stair training;Functional mobility training;Therapeutic activities;Therapeutic exercise;Balance training;Neuromuscular re-education;Manual techniques;Patient/family education;Passive range of motion;Taping;Vasopneumatic Device           Patient will benefit from skilled therapeutic intervention in order to improve the following deficits and impairments:  Improper body mechanics,Increased muscle spasms,Decreased strength,Postural dysfunction,Pain,Decreased range of motion,Decreased endurance,Decreased activity tolerance,Increased edema  Visit Diagnosis: Chronic right shoulder pain  Stiffness of right shoulder, not elsewhere classified  Muscle weakness (generalized)     Problem List Patient Active Problem List   Diagnosis Date Noted  . Vitamin D deficiency 01/04/2017  . Breast cancer of upper-inner quadrant of left female breast ([place]) 09/02/2016  . Thrombocytopenia ([place]) 10/09/2015  . Lymphedema of breast 10/09/2015  . Genetic testing 09/23/2015  . Peripheral neuropathy due to chemotherapy ([place]) 08/14/2014  . Cellulitis of chest wall 08/01/2014  . Port-A-Cath in place 08/01/2014  . Altered mental status 07/29/2014  . Transaminasemia 07/29/2014  . Conjunctivitis 07/29/2014  .  Deafness congenital 03/20/2014  . History of right breast cancer 01/11/2014   Rich Number, PT, DPT, OCS, Crt. DN Bethena Midget 04/08/2021, 4:37 PM  [facility] [address] [place], [place], [address] Phone: [phone]   Fax:  [phone]  Name: Erlene Devita MRN: [id number] Date of Birth: 02-04-1961

## 2021-04-11 ENCOUNTER — Ambulatory Visit: Payer: BC Managed Care – PPO | Admitting: Physical Therapy

## 2021-04-15 ENCOUNTER — Ambulatory Visit: Payer: BC Managed Care – PPO | Attending: Orthopedic Surgery | Admitting: Physical Therapy

## 2021-04-15 ENCOUNTER — Other Ambulatory Visit: Payer: Self-pay

## 2021-04-15 ENCOUNTER — Encounter: Payer: Self-pay | Admitting: Physical Therapy

## 2021-04-15 DIAGNOSIS — R600 Localized edema: Secondary | ICD-10-CM | POA: Insufficient documentation

## 2021-04-15 DIAGNOSIS — M25611 Stiffness of right shoulder, not elsewhere classified: Secondary | ICD-10-CM | POA: Insufficient documentation

## 2021-04-15 DIAGNOSIS — M6281 Muscle weakness (generalized): Secondary | ICD-10-CM | POA: Diagnosis not present

## 2021-04-15 DIAGNOSIS — R293 Abnormal posture: Secondary | ICD-10-CM | POA: Diagnosis not present

## 2021-04-15 DIAGNOSIS — M25511 Pain in right shoulder: Secondary | ICD-10-CM | POA: Insufficient documentation

## 2021-04-15 DIAGNOSIS — G8929 Other chronic pain: Secondary | ICD-10-CM | POA: Diagnosis not present

## 2021-04-15 NOTE — Therapy (Signed)
[place], [place], [address] Phone: [phone]   Fax:  [phone]  Physical Therapy Treatment  Patient Details  Name: Regina Watts MRN: [id number] Date of Birth: 05-01-1961 Referring Provider (PT): Tania Ade   Encounter Date: 04/15/2021   PT End of Session - 04/15/21 1549    Visit Number 13    Number of Visits 17    Date for PT Re-Evaluation 05/12/21    Authorization Type BCBS    Progress Note Due on Visit 20    PT Start Time 1545    PT Stop Time 1635    PT Time Calculation (min) 50 min    Activity Tolerance Patient limited by pain;Patient tolerated treatment well    Behavior During Therapy [facility] for tasks assessed/performed           Past Medical History:  Diagnosis Date  . Breast cancer ([place]) JANUARY 2002   RIGHT BREAST T4 SENTINEL NODE NEGATIVE, HER2 3+, ER/PR NEGATIVE  . Breast cancer [facility]) February 2015   Left Breast T1N0  . Congenital deafness   . Hx antineoplastic chemo   . Hx of radiation therapy   . Hyperlipidemia   . Personal history of radiation therapy   . Wears glasses    to drive    Past Surgical History:  Procedure Laterality Date  . AUGMENTATION MAMMAPLASTY Left    breast lift   . BREAST LUMPECTOMY Left 03/06/2014  . BREAST LUMPECTOMY Right 11/2000  . BREAST LUMPECTOMY WITH NEEDLE LOCALIZATION AND AXILLARY SENTINEL LYMPH NODE BX Left 03/06/2014   Procedure: BREAST LUMPECTOMY WITH NEEDLE LOCALIZATION AND AXILLARY SENTINEL LYMPH NODE BIOPSY;  Surgeon: Shann Medal, MD;  Location: [place];  Service: General;  Laterality: Left;  . BREAST SURGERY  2002   lumpectomy on right side  . CESAREAN SECTION    . MASTOPEXY Left 09/07/2017   Procedure: LEFT BREAST MASTOPEXY;  Surgeon: Irene Limbo, MD;  Location: [place];  Service: Plastics;  Laterality: Left;  . PORT-A-CATH REMOVAL Right 08/03/2014   Procedure: REMOVAL PORT-A-CATH;   Surgeon: Jackolyn Confer, MD;  [facility];  Service: General;  Laterality: Right;  . PORTACATH PLACEMENT Right 03/06/2014   Procedure: INSERTION PORT-A-CATH;  Surgeon: Shann Medal, MD;  Location: [place];  Service: General;  Laterality: Right;  subclavian  . ROTATOR CUFF REPAIR Right 02/04/2021  . TONSILLECTOMY      There were no vitals filed for this visit.   Subjective Assessment - 04/15/21 1651    Subjective "pt reports continues soreness in the shoulder mostly on the outside of the shoulder."    Patient Stated Goals to get stronger    Currently in Pain? Yes    Pain Score 5     Pain Location Shoulder    Pain Orientation Right    Pain Descriptors / Indicators Aching    Pain Type Chronic pain    Pain Onset More than a month ago    Pain Frequency Intermittent    Aggravating Factors  any R shoulder activity    Pain Relieving Factors exercise              [facility] PT Assessment - 04/15/21 0001      Assessment   Medical Diagnosis R RCR    Referring Provider (PT) Tania Ade  [place] Adult PT Treatment/Exercise - 04/15/21 0001      Shoulder Exercises: Standing   External Rotation Strengthening;Right;10 reps    Theraband Level (Shoulder External Rotation) Level 2 (Red)    Internal Rotation Strengthening;Right;10 reps    Theraband Level (Shoulder Internal Rotation) Level 2 (Red)    Theraband Level (Shoulder Extension) Level 3 (Green)   assisting with flexion   Other Standing Exercises self distraciton holding on sink with posterior stepping/ weight shift    Other Standing Exercises wall ladder      Shoulder Exercises: ROM/Strengthening   UBE (Upper Arm Bike) --    Nustep L6 x 5 min UE/LE      Moist Heat Therapy   Number Minutes Moist Heat 10 Minutes    Moist Heat Location Shoulder      Manual Therapy   Manual Therapy Taping    Kinesiotex Inhibit Muscle      Kinesiotix   Inhibit Muscle  deltoid and  bicep            Trigger Point Dry Needling - 04/15/21 0001    Consent Given? Yes    Education Handout Provided Previously provided    Muscles Treated Upper Quadrant Deltoid    Deltoid Response Twitch response elicited;Palpable increased muscle length   middle deltoid                 PT Short Term Goals - 04/03/21 1349      PT SHORT TERM GOAL #1   Title pt to be IND with inital HEP    Status Achieved      PT SHORT TERM GOAL #2   Title increase R shoulder AAROM to >/= 90 degrees flexion / abduction for therapuetic progression    Status Partially Met      PT SHORT TERM GOAL #3   Title pt to verbalize/ demo efficient posture to reduce and prevent R shoulder pain    Status Achieved             PT Long Term Goals - 03/27/21 1405      PT LONG TERM GOAL #1   Title increase R shoulder AROM flexion/ abduction to >/= 120 degrees and IR/ER WFL compared bil for functional ROM required for ADLs with </=2/10 max pain    Status On-going    Target Date 04/14/21      PT LONG TERM GOAL #2   Title increase R shoulder gross strengthe to >/= 4+/5 to promote scapular stability    Baseline unable to assess    Period Weeks    Status On-going    Target Date 04/14/21      PT LONG TERM GOAL #3   Title pt to be able to lift and / lower >/= 15# from floor<>waist, and 8# to and  from waist to overhead for functional ROM And strength    Period Weeks    Status On-going    Target Date 04/14/21      PT LONG TERM GOAL #4   Title increase FOTO score to >/= 67% to demo improvement in function    Period Weeks      PT LONG TERM GOAL #5   Title pt to be IND with all HEP given and will be able to maintain and progress current LOF IND                 Plan - 04/15/21 1718    Clinical Impression Statement pt arrives reporting no changes in  pain, Continues to demonstrate limited shoulder AROM and AAROM secondary to pain and guarding. continued TPDN focusing onthe middle deltoid  followed with IASTM techinques. Continued working on [employer] with wall ladder which she demonsrates limited gross mobliity noting pain inthe shoulder pointing to the R AC joint. If she continues to have limited shoulder ROM and pain then it would be to her best benefit to follow up with her MD for further assessment.    PT Treatment/Interventions ADLs/Self Care Home Management;Cryotherapy;Stair training;Functional mobility training;Therapeutic activities;Therapeutic exercise;Balance training;Neuromuscular re-education;Manual techniques;Patient/family education;Passive range of motion;Taping;Vasopneumatic Device    PT Next Visit Plan continue PROM/AROM of the right shoulder, PRN for shoulder and scapular strengthening.    PT Home Exercise Plan [id number] - pendulums, table slides flexion/ abduction, scapular retraction, upper trap stretch. dowel press and supine flexion AAROm    Consulted and Agree with Plan of Care Patient           Patient will benefit from skilled therapeutic intervention in order to improve the following deficits and impairments:  Improper body mechanics,Increased muscle spasms,Decreased strength,Postural dysfunction,Pain,Decreased range of motion,Decreased endurance,Decreased activity tolerance,Increased edema  Visit Diagnosis: Chronic right shoulder pain  Stiffness of right shoulder, not elsewhere classified  Muscle weakness (generalized)  Abnormal posture  Localized edema     Problem List Patient Active Problem List   Diagnosis Date Noted  . Vitamin D deficiency 01/04/2017  . Breast cancer of upper-inner quadrant of left female breast ([place]) 09/02/2016  . Thrombocytopenia ([place]) 10/09/2015  . Lymphedema of breast 10/09/2015  . Genetic testing 09/23/2015  . Peripheral neuropathy due to chemotherapy ([place]) 08/14/2014  . Cellulitis of chest wall 08/01/2014  . Port-A-Cath in place 08/01/2014  . Altered mental status 07/29/2014  . Transaminasemia 07/29/2014  .  Conjunctivitis 07/29/2014  . Deafness congenital 03/20/2014  . History of right breast cancer 01/11/2014   Starr Lake PT, DPT, LAT, ATC  04/15/21  5:28 PM      [facility] [address] [place], [place], [address] Phone: [phone]   Fax:  [phone]  Name: Regina Watts MRN: [id number] Date of Birth: 05/13/1961

## 2021-04-17 ENCOUNTER — Ambulatory Visit: Payer: BC Managed Care – PPO | Admitting: Physical Therapy

## 2021-04-22 ENCOUNTER — Encounter: Payer: Self-pay | Admitting: Physical Therapy

## 2021-04-22 ENCOUNTER — Other Ambulatory Visit: Payer: Self-pay

## 2021-04-22 ENCOUNTER — Ambulatory Visit: Payer: BC Managed Care – PPO | Admitting: Physical Therapy

## 2021-04-22 DIAGNOSIS — M25611 Stiffness of right shoulder, not elsewhere classified: Secondary | ICD-10-CM

## 2021-04-22 DIAGNOSIS — M25511 Pain in right shoulder: Secondary | ICD-10-CM | POA: Diagnosis not present

## 2021-04-22 DIAGNOSIS — R293 Abnormal posture: Secondary | ICD-10-CM | POA: Diagnosis not present

## 2021-04-22 DIAGNOSIS — R600 Localized edema: Secondary | ICD-10-CM | POA: Diagnosis not present

## 2021-04-22 DIAGNOSIS — G8929 Other chronic pain: Secondary | ICD-10-CM | POA: Diagnosis not present

## 2021-04-22 DIAGNOSIS — M6281 Muscle weakness (generalized): Secondary | ICD-10-CM

## 2021-04-22 NOTE — Therapy (Signed)
[place], [place], [address] Phone: [phone]   Fax:  [phone]  Physical Therapy Treatment  Patient Details  Name: Regina Watts MRN: [id number] Date of Birth: Mar 31, 1961 Referring Provider (PT): Tania Ade   Encounter Date: 04/22/2021   PT End of Session - 04/22/21 1103     Visit Number 14    Number of Visits 17    Date for PT Re-Evaluation 05/12/21    Authorization Type BCBS    Progress Note Due on Visit 20    PT Start Time 1103    PT Stop Time 1151    PT Time Calculation (min) 48 min    Activity Tolerance Patient limited by pain;Patient tolerated treatment well    Behavior During Therapy [facility] for tasks assessed/performed             Past Medical History:  Diagnosis Date   Breast cancer ([place]) JANUARY 2002   RIGHT BREAST T4 SENTINEL NODE NEGATIVE, HER2 3+, ER/PR NEGATIVE   Breast cancer ([place]) February 2015   Left Breast T1N0   Congenital deafness    Hx antineoplastic chemo    Hx of radiation therapy    Hyperlipidemia    Personal history of radiation therapy    Wears glasses    to drive    Past Surgical History:  Procedure Laterality Date   AUGMENTATION MAMMAPLASTY Left    breast lift    BREAST LUMPECTOMY Left 03/06/2014   BREAST LUMPECTOMY Right 11/2000   BREAST LUMPECTOMY WITH NEEDLE LOCALIZATION AND AXILLARY SENTINEL LYMPH NODE BX Left 03/06/2014   Procedure: BREAST LUMPECTOMY WITH NEEDLE LOCALIZATION AND AXILLARY SENTINEL LYMPH NODE BIOPSY;  Surgeon: Shann Medal, MD;  Location: [place];  Service: General;  Laterality: Left;   BREAST SURGERY  2002   lumpectomy on right side   CESAREAN SECTION     MASTOPEXY Left 09/07/2017   Procedure: LEFT BREAST MASTOPEXY;  Surgeon: Irene Limbo, MD;  Location: [place];  Service: Plastics;  Laterality: Left;   PORT-A-CATH REMOVAL Right 08/03/2014   Procedure: REMOVAL PORT-A-CATH;  Surgeon:  Jackolyn Confer, MD;  [facility];  Service: General;  Laterality: Right;   PORTACATH PLACEMENT Right 03/06/2014   Procedure: INSERTION PORT-A-CATH;  Surgeon: Shann Medal, MD;  Location: [place];  Service: General;  Laterality: Right;  subclavian   ROTATOR CUFF REPAIR Right 02/04/2021   TONSILLECTOMY      There were no vitals filed for this visit.   Subjective Assessment - 04/22/21 1105     Subjective " everything is fine, but it seems like dry didn't help. I am still having pain at 2-3/10."    Patient Stated Goals to get stronger    Currently in Pain? Yes    Pain Score 3     Pain Location Shoulder    Pain Orientation Right    Pain Descriptors / Indicators Aching    Pain Type Chronic pain    Pain Frequency Intermittent    Aggravating Factors  exercise, getting dressing/ clothing    Pain Relieving Factors stop and rest,                [facility] PT Assessment - 04/22/21 0001       Assessment   Medical Diagnosis R RCR    Referring Provider (PT) Tania Ade  [place] Adult PT Treatment/Exercise - 04/22/21 0001       Shoulder Exercises: Sidelying   External Rotation Strengthening;10 reps;Right    Other Sidelying Exercises R shoulder AAROM in L sideling using UE ranger, combined with manual scapular upward rotation 3 x 10      Shoulder Exercises: ROM/Strengthening   Nustep L5 x 5 min UE/LE      Manual Therapy   Manual Therapy Other (comment)    Joint Mobilization GHJ grade III-IV PA/AP and inferior mobs, Distal calvicle mobs inferior/ posterior grade III,    Scapular Mobilization scapular upward rotation/ down roation and protraciton grade III    Other Manual Therapy trialed distal clavicle taping to promote support and increase surace area to promote pressure distribution.                    PT Education - 04/22/21 1108     Education Details how to make a heat pack using dry rice and pillow  case and micorwave to tolerance.    Person(s) Educated Patient    Methods Explanation;Verbal cues;Handout    Comprehension Verbalized understanding;Verbal cues required              PT Short Term Goals - 04/03/21 1349       PT SHORT TERM GOAL #1   Title pt to be IND with inital HEP    Status Achieved      PT SHORT TERM GOAL #2   Title increase R shoulder AAROM to >/= 90 degrees flexion / abduction for therapuetic progression    Status Partially Met      PT SHORT TERM GOAL #3   Title pt to verbalize/ demo efficient posture to reduce and prevent R shoulder pain    Status Achieved               PT Long Term Goals - 03/27/21 1405       PT LONG TERM GOAL #1   Title increase R shoulder AROM flexion/ abduction to >/= 120 degrees and IR/ER WFL compared bil for functional ROM required for ADLs with </=2/10 max pain    Status On-going    Target Date 04/14/21      PT LONG TERM GOAL #2   Title increase R shoulder gross strengthe to >/= 4+/5 to promote scapular stability    Baseline unable to assess    Period Weeks    Status On-going    Target Date 04/14/21      PT LONG TERM GOAL #3   Title pt to be able to lift and / lower >/= 15# from floor<>waist, and 8# to and  from waist to overhead for functional ROM And strength    Period Weeks    Status On-going    Target Date 04/14/21      PT LONG TERM GOAL #4   Title increase FOTO score to >/= 67% to demo improvement in function    Period Weeks      PT LONG TERM GOAL #5   Title pt to be IND with all HEP given and will be able to maintain and progress current LOF IND                   Plan - 04/22/21 1144     Clinical Impression Statement pt continues to report pain pointing to the A/C joint that is worst with any activity. focused mostof session on GHJ mobs, A/C mobs and scapular mobility to maximixe  her ROM which continues to demonstrate limtation with guarding noted both passively and actively. continued MHP  end of session to calm down soreness.    PT Treatment/Interventions ADLs/Self Care Home Management;Cryotherapy;Stair training;Functional mobility training;Therapeutic activities;Therapeutic exercise;Balance training;Neuromuscular re-education;Manual techniques;Patient/family education;Passive range of motion;Taping;Vasopneumatic Device    PT Next Visit Plan continue PROM/AROM of the right shoulder, PRN for shoulder and scapular strengthening.    PT Home Exercise Plan [id number] - pendulums, table slides flexion/ abduction, scapular retraction, upper trap stretch. dowel press and supine flexion AAROm             Patient will benefit from skilled therapeutic intervention in order to improve the following deficits and impairments:  Improper body mechanics, Increased muscle spasms, Decreased strength, Postural dysfunction, Pain, Decreased range of motion, Decreased endurance, Decreased activity tolerance, Increased edema  Visit Diagnosis: Chronic right shoulder pain  Stiffness of right shoulder, not elsewhere classified  Muscle weakness (generalized)  Abnormal posture  Localized edema     Problem List Patient Active Problem List   Diagnosis Date Noted   Vitamin D deficiency 01/04/2017   Breast cancer of upper-inner quadrant of left female breast ([place]) 09/02/2016   Thrombocytopenia ([place]) 10/09/2015   Lymphedema of breast 10/09/2015   Genetic testing 09/23/2015   Peripheral neuropathy due to chemotherapy ([place]) 08/14/2014   Cellulitis of chest wall 08/01/2014   Port-A-Cath in place 08/01/2014   Altered mental status 07/29/2014   Transaminasemia 07/29/2014   Conjunctivitis 07/29/2014   Deafness congenital 03/20/2014   History of right breast cancer 01/11/2014    Starr Lake PT, DPT, LAT, ATC  04/22/21  11:52 AM     [facility] [address] [place], [place], [address] Phone: [phone]   Fax:  [phone]  Name:  Regina Watts MRN: [id number] Date of Birth: 04-13-1961

## 2021-04-24 ENCOUNTER — Other Ambulatory Visit: Payer: Self-pay

## 2021-04-24 ENCOUNTER — Encounter: Payer: Self-pay | Admitting: Physical Therapy

## 2021-04-24 ENCOUNTER — Ambulatory Visit: Payer: BC Managed Care – PPO | Admitting: Physical Therapy

## 2021-04-24 DIAGNOSIS — M25611 Stiffness of right shoulder, not elsewhere classified: Secondary | ICD-10-CM

## 2021-04-24 DIAGNOSIS — G8929 Other chronic pain: Secondary | ICD-10-CM

## 2021-04-24 DIAGNOSIS — M6281 Muscle weakness (generalized): Secondary | ICD-10-CM | POA: Diagnosis not present

## 2021-04-24 DIAGNOSIS — R293 Abnormal posture: Secondary | ICD-10-CM | POA: Diagnosis not present

## 2021-04-24 DIAGNOSIS — M25511 Pain in right shoulder: Secondary | ICD-10-CM | POA: Diagnosis not present

## 2021-04-24 DIAGNOSIS — R600 Localized edema: Secondary | ICD-10-CM | POA: Diagnosis not present

## 2021-04-24 NOTE — Therapy (Signed)
[place], [place], [address] Phone: [phone]   Fax:  [phone]  Physical Therapy Treatment  Patient Details  Name: Regina Watts MRN: [id number] Date of Birth: 1961/02/25 Referring Provider (PT): Tania Ade   Encounter Date: 04/24/2021   PT End of Session - 04/24/21 1337     Visit Number 15    Number of Visits 17    Date for PT Re-Evaluation 05/12/21    Authorization Type BCBS    Progress Note Due on Visit 20    PT Start Time 1335    PT Stop Time 1423    PT Time Calculation (min) 48 min    Activity Tolerance Patient limited by pain;Patient tolerated treatment well    Behavior During Therapy [facility] for tasks assessed/performed             Past Medical History:  Diagnosis Date   Breast cancer ([place]) JANUARY 2002   RIGHT BREAST T4 SENTINEL NODE NEGATIVE, HER2 3+, ER/PR NEGATIVE   Breast cancer ([place]) February 2015   Left Breast T1N0   Congenital deafness    Hx antineoplastic chemo    Hx of radiation therapy    Hyperlipidemia    Personal history of radiation therapy    Wears glasses    to drive    Past Surgical History:  Procedure Laterality Date   AUGMENTATION MAMMAPLASTY Left    breast lift    BREAST LUMPECTOMY Left 03/06/2014   BREAST LUMPECTOMY Right 11/2000   BREAST LUMPECTOMY WITH NEEDLE LOCALIZATION AND AXILLARY SENTINEL LYMPH NODE BX Left 03/06/2014   Procedure: BREAST LUMPECTOMY WITH NEEDLE LOCALIZATION AND AXILLARY SENTINEL LYMPH NODE BIOPSY;  Surgeon: Shann Medal, MD;  Location: [place];  Service: General;  Laterality: Left;   BREAST SURGERY  2002   lumpectomy on right side   CESAREAN SECTION     MASTOPEXY Left 09/07/2017   Procedure: LEFT BREAST MASTOPEXY;  Surgeon: Irene Limbo, MD;  Location: [place];  Service: Plastics;  Laterality: Left;   PORT-A-CATH REMOVAL Right 08/03/2014   Procedure: REMOVAL PORT-A-CATH;  Surgeon:  Jackolyn Confer, MD;  [facility];  Service: General;  Laterality: Right;   PORTACATH PLACEMENT Right 03/06/2014   Procedure: INSERTION PORT-A-CATH;  Surgeon: Shann Medal, MD;  Location: [place];  Service: General;  Laterality: Right;  subclavian   ROTATOR CUFF REPAIR Right 02/04/2021   TONSILLECTOMY      There were no vitals filed for this visit.   Subjective Assessment - 04/24/21 1338     Subjective " I think the pain is better, and the motion is getting better."    Currently in Pain? Yes    Pain Score 2     Pain Location Shoulder    Pain Orientation Right                [facility] PT Assessment - 04/24/21 0001       Assessment   Medical Diagnosis R RCR    Referring Provider (PT) Tania Ade                           [facility] Adult PT Treatment/Exercise - 04/24/21 0001       Shoulder Exercises: Standing   Protraction Both;15 reps   bil UE   Protraction Limitations demonstration for proper form    External Rotation Strengthening;Right;10 reps    Theraband Level (Shoulder External  Rotation) Level 2 (Red)    Internal Rotation Strengthening;Right;10 reps    Theraband Level (Shoulder Internal Rotation) Level 2 (Red)    Other Standing Exercises lower trap activaiton with elbows propped on wall 2 x 10    Other Standing Exercises reaching into cabinet 1 x 10 lower shelf, 1 x 10 middle shelf, 1 x 10 upper shelf   unable to reach top shelf     Shoulder Exercises: ROM/Strengthening   UBE (Upper Arm Bike) L1 x 4 min   fwd / bwd   Other ROM/Strengthening Exercises rolling phyisolball up/ over 1 x 10 bil UE      Shoulder Exercises: Stretch   Other Shoulder Stretches Levator scapulae and upper trap 1 x 30 seconds      Moist Heat Therapy   Number Minutes Moist Heat 10 Minutes    Moist Heat Location Shoulder   seated     Manual Therapy   Scapular Mobilization scapular upward rotation during forward flexion AROM                       PT Short Term Goals - 04/03/21 1349       PT SHORT TERM GOAL #1   Title pt to be IND with inital HEP    Status Achieved      PT SHORT TERM GOAL #2   Title increase R shoulder AAROM to >/= 90 degrees flexion / abduction for therapuetic progression    Status Partially Met      PT SHORT TERM GOAL #3   Title pt to verbalize/ demo efficient posture to reduce and prevent R shoulder pain    Status Achieved               PT Long Term Goals - 03/27/21 1405       PT LONG TERM GOAL #1   Title increase R shoulder AROM flexion/ abduction to >/= 120 degrees and IR/ER WFL compared bil for functional ROM required for ADLs with </=2/10 max pain    Status On-going    Target Date 04/14/21      PT LONG TERM GOAL #2   Title increase R shoulder gross strengthe to >/= 4+/5 to promote scapular stability    Baseline unable to assess    Period Weeks    Status On-going    Target Date 04/14/21      PT LONG TERM GOAL #3   Title pt to be able to lift and / lower >/= 15# from floor<>waist, and 8# to and  from waist to overhead for functional ROM And strength    Period Weeks    Status On-going    Target Date 04/14/21      PT LONG TERM GOAL #4   Title increase FOTO score to >/= 67% to demo improvement in function    Period Weeks      PT LONG TERM GOAL #5   Title pt to be IND with all HEP given and will be able to maintain and progress current LOF IND                   Plan - 04/24/21 1416     Clinical Impression Statement pt arrives to session to day noting decreased pain rated today 2/10. Continued working on on AAROM progressing to AROM shoulder mobility which she contineus to demon limitation which she describes as a firm block with soreness. continued working on shoulder strengthenign which she is  progressing with.    PT Treatment/Interventions ADLs/Self Care Home Management;Cryotherapy;Stair training;Functional mobility training;Therapeutic  activities;Therapeutic exercise;Balance training;Neuromuscular re-education;Manual techniques;Patient/family education;Passive range of motion;Taping;Vasopneumatic Device    PT Next Visit Plan ROM, strength, goals, send not to MD. continue PROM/AROM of the right shoulder, PRN for shoulder and scapular strengthening.    PT Home Exercise Plan [id number] - pendulums, table slides flexion/ abduction, scapular retraction, upper trap stretch. dowel press and supine flexion AAROm             Patient will benefit from skilled therapeutic intervention in order to improve the following deficits and impairments:  Improper body mechanics, Increased muscle spasms, Decreased strength, Postural dysfunction, Pain, Decreased range of motion, Decreased endurance, Decreased activity tolerance, Increased edema  Visit Diagnosis: Chronic right shoulder pain  Stiffness of right shoulder, not elsewhere classified  Muscle weakness (generalized)  Abnormal posture  Localized edema     Problem List Patient Active Problem List   Diagnosis Date Noted   Vitamin D deficiency 01/04/2017   Breast cancer of upper-inner quadrant of left female breast ([place]) 09/02/2016   Thrombocytopenia ([place]) 10/09/2015   Lymphedema of breast 10/09/2015   Genetic testing 09/23/2015   Peripheral neuropathy due to chemotherapy ([place]) 08/14/2014   Cellulitis of chest wall 08/01/2014   Port-A-Cath in place 08/01/2014   Altered mental status 07/29/2014   Transaminasemia 07/29/2014   Conjunctivitis 07/29/2014   Deafness congenital 03/20/2014   History of right breast cancer 01/11/2014   Starr Lake PT, DPT, LAT, ATC  04/24/21  2:19 PM      [facility] [address] [place], [place], [address] Phone: [phone]   Fax:  [phone]  Name: Marelin Tat MRN: [id number] Date of Birth: 25-Feb-1961

## 2021-04-29 ENCOUNTER — Other Ambulatory Visit: Payer: Self-pay

## 2021-04-29 ENCOUNTER — Encounter: Payer: Self-pay | Admitting: Physical Therapy

## 2021-04-29 ENCOUNTER — Ambulatory Visit: Payer: BC Managed Care – PPO | Admitting: Physical Therapy

## 2021-04-29 DIAGNOSIS — G8929 Other chronic pain: Secondary | ICD-10-CM

## 2021-04-29 DIAGNOSIS — R600 Localized edema: Secondary | ICD-10-CM | POA: Diagnosis not present

## 2021-04-29 DIAGNOSIS — M25511 Pain in right shoulder: Secondary | ICD-10-CM

## 2021-04-29 DIAGNOSIS — M6281 Muscle weakness (generalized): Secondary | ICD-10-CM

## 2021-04-29 DIAGNOSIS — R293 Abnormal posture: Secondary | ICD-10-CM

## 2021-04-29 DIAGNOSIS — M25611 Stiffness of right shoulder, not elsewhere classified: Secondary | ICD-10-CM

## 2021-04-29 NOTE — Therapy (Signed)
[place], [place], [address] Phone: [phone]   Fax:  [phone]  Physical Therapy Treatment  Patient Details  Name: Regina Watts MRN: [id number] Date of Birth: [birth year] Referring Provider (PT): Tania Ade   Encounter Date: 04/29/2021   PT End of Session - 04/29/21 1416     Visit Number 16    Number of Visits 17    Date for PT Re-Evaluation 05/12/21    Authorization Type BCBS    Progress Note Due on Visit 20    PT Start Time 1416    PT Stop Time 1458    PT Time Calculation (min) 42 min    Activity Tolerance Patient limited by pain;Patient tolerated treatment well    Behavior During Therapy [facility] for tasks assessed/performed             Past Medical History:  Diagnosis Date   Breast cancer ([place]) JANUARY 2002   RIGHT BREAST T4 SENTINEL NODE NEGATIVE, HER2 3+, ER/PR NEGATIVE   Breast cancer ([place]) February 2015   Left Breast T1N0   Congenital deafness    Hx antineoplastic chemo    Hx of radiation therapy    Hyperlipidemia    Personal history of radiation therapy    Wears glasses    to drive    Past Surgical History:  Procedure Laterality Date   AUGMENTATION MAMMAPLASTY Left    breast lift    BREAST LUMPECTOMY Left 03/06/2014   BREAST LUMPECTOMY Right 11/2000   BREAST LUMPECTOMY WITH NEEDLE LOCALIZATION AND AXILLARY SENTINEL LYMPH NODE BX Left 03/06/2014   Procedure: BREAST LUMPECTOMY WITH NEEDLE LOCALIZATION AND AXILLARY SENTINEL LYMPH NODE BIOPSY;  Surgeon: Shann Medal, MD;  Location: [place];  Service: General;  Laterality: Left;   BREAST SURGERY  2002   lumpectomy on right side   CESAREAN SECTION     MASTOPEXY Left 09/07/2017   Procedure: LEFT BREAST MASTOPEXY;  Surgeon: Irene Limbo, MD;  Location: [place];  Service: Plastics;  Laterality: Left;   PORT-A-CATH REMOVAL Right 08/03/2014   Procedure: REMOVAL PORT-A-CATH;  Surgeon:  Jackolyn Confer, MD;  [facility];  Service: General;  Laterality: Right;   PORTACATH PLACEMENT Right 03/06/2014   Procedure: INSERTION PORT-A-CATH;  Surgeon: Shann Medal, MD;  Location: [place];  Service: General;  Laterality: Right;  subclavian   ROTATOR CUFF REPAIR Right 02/04/2021   TONSILLECTOMY      There were no vitals filed for this visit.   Subjective Assessment - 04/29/21 1417     Subjective "I mowed and did alot of weed eating, I feel like I can raise pretty well, but I feel like I am still limited with raising my arm up in the front."    Currently in Pain? No/denies    Pain Score 0-No pain    Aggravating Factors  positiing in bed.    Pain Relieving Factors resting                [facility] PT Assessment - 04/29/21 0001       Assessment   Medical Diagnosis R RCR    Referring Provider (PT) Tania Ade      AROM   Right Shoulder Extension 36 Degrees    Right Shoulder Flexion 87 Degrees    Right Shoulder ABduction 80 Degrees    Right Shoulder Internal Rotation --   R SIJ   Right Shoulder External Rotation --  C6     Strength   Right Shoulder Flexion 3+/5   in available ROM   Right Shoulder Extension 4+/5   in available ROM   Right Shoulder ABduction 3+/5   in available ROM   Right Shoulder Internal Rotation 4-/5   in available ROM   Right Shoulder External Rotation 3+/5   in available ROM                          [facility] Adult PT Treatment/Exercise - 04/29/21 0001       Shoulder Exercises: Standing   Protraction Both;15 reps    External Rotation Strengthening;15 reps;Theraband    Theraband Level (Shoulder External Rotation) Level 3 (Green)    Internal Rotation 15 reps;Theraband    Theraband Level (Shoulder Internal Rotation) Level 3 (Green)    Flexion Strengthening;Right;10 reps;Weights   1 x 15 lower shelf, 1 x 15 middle shelf   Shoulder Flexion Weight (lbs) 3      Shoulder Exercises: ROM/Strengthening   UBE  (Upper Arm Bike) L1 x 6 min   fwd/bwd x 3 min                   PT Education - 04/29/21 1459     Education Details Reviewed HEP and plan moving forward.    Person(s) Educated Patient    Methods Explanation;Verbal cues    Comprehension Verbalized understanding;Verbal cues required              PT Short Term Goals - 04/29/21 1425       PT SHORT TERM GOAL #1   Title pt to be IND with inital HEP    Period Weeks    Status Achieved      PT SHORT TERM GOAL #2   Title increase R shoulder AAROM to >/= 90 degrees flexion / abduction for therapuetic progression    Period Weeks    Status Achieved      PT SHORT TERM GOAL #3   Title pt to verbalize/ demo efficient posture to reduce and prevent R shoulder pain    Period Weeks    Status Achieved               PT Long Term Goals - 04/29/21 1426       PT LONG TERM GOAL #1   Title increase R shoulder AROM flexion/ abduction to >/= 120 degrees and IR/ER WFL compared bil for functional ROM required for ADLs with </=2/10 max pain    Period Weeks    Status On-going    Target Date 06/10/21      PT LONG TERM GOAL #2   Title increase R shoulder gross strengthe to >/= 4+/5 to promote scapular stability    Period Weeks    Status On-going    Target Date 06/10/21      PT LONG TERM GOAL #3   Title pt to be able to lift and / lower >/= 15# from floor<>waist, and 8# to and  from waist to overhead for functional ROM And strength    Period Weeks    Status On-going    Target Date 06/10/21      PT LONG TERM GOAL #4   Title increase FOTO score to >/= 67% to demo improvement in function    Period Weeks    Status On-going    Target Date 06/10/21      PT LONG TERM GOAL #5   Title  pt to be IND with all HEP given and will be able to maintain and progress current LOF IND    Period Weeks    Status On-going    Target Date 06/10/21                   Plan - 04/29/21 1438     Clinical Impression Statement Mrs Regina Watts  has made progress with physical therapy increasing shoulder ROM and strength compared to initial evaluation and reports limited to no pain at rest. She continues to demonstrate limitaiton in R shoulder gross AROM secondary to pain / guarding utilizng shoulder hiking compensation. over the course of the last few visits she has made limited progression with ROM and toward her goals. She did well today with strengthening noting limited soreness following session. Pt sees her MD tomorrow and plan to see how that visit goes and plan moving forward.    PT Treatment/Interventions ADLs/Self Care Home Management;Cryotherapy;Stair training;Functional mobility training;Therapeutic activities;Therapeutic exercise;Balance training;Neuromuscular re-education;Manual techniques;Patient/family education;Passive range of motion;Taping;Vasopneumatic Device    PT Next Visit Plan how was MD's visit. continue PROM/AROM of the right shoulder, PRN for shoulder and scapular strengthening.    PT Home Exercise Plan [id number] - pendulums, table slides flexion/ abduction, scapular retraction, upper trap stretch. dowel press and supine flexion AAROm    Consulted and Agree with Plan of Care Patient             Patient will benefit from skilled therapeutic intervention in order to improve the following deficits and impairments:  Improper body mechanics, Increased muscle spasms, Decreased strength, Postural dysfunction, Pain, Decreased range of motion, Decreased endurance, Decreased activity tolerance, Increased edema  Visit Diagnosis: Chronic right shoulder pain  Stiffness of right shoulder, not elsewhere classified  Muscle weakness (generalized)  Abnormal posture  Localized edema     Problem List Patient Active Problem List   Diagnosis Date Noted   Vitamin D deficiency 01/04/2017   Breast cancer of upper-inner quadrant of left female breast ([place]) 09/02/2016   Thrombocytopenia ([place]) 10/09/2015   Lymphedema of  breast 10/09/2015   Genetic testing 09/23/2015   Peripheral neuropathy due to chemotherapy ([place]) 08/14/2014   Cellulitis of chest wall 08/01/2014   Port-A-Cath in place 08/01/2014   Altered mental status 07/29/2014   Transaminasemia 07/29/2014   Conjunctivitis 07/29/2014   Deafness congenital 03/20/2014   History of right breast cancer 01/11/2014   Starr Lake PT, DPT, LAT, ATC  04/29/21  3:00 PM      [facility] [address] [place], [place], [address] Phone: [phone]   Fax:  [phone]  Name: Atonya Templer MRN: [id number] Date of Birth: [birth year]

## 2021-05-01 ENCOUNTER — Ambulatory Visit: Payer: BC Managed Care – PPO | Admitting: Physical Therapy

## 2021-05-01 ENCOUNTER — Encounter: Payer: Self-pay | Admitting: Physical Therapy

## 2021-05-01 ENCOUNTER — Other Ambulatory Visit: Payer: Self-pay

## 2021-05-01 DIAGNOSIS — R600 Localized edema: Secondary | ICD-10-CM

## 2021-05-01 DIAGNOSIS — M6281 Muscle weakness (generalized): Secondary | ICD-10-CM | POA: Diagnosis not present

## 2021-05-01 DIAGNOSIS — G8929 Other chronic pain: Secondary | ICD-10-CM

## 2021-05-01 DIAGNOSIS — M25611 Stiffness of right shoulder, not elsewhere classified: Secondary | ICD-10-CM | POA: Diagnosis not present

## 2021-05-01 DIAGNOSIS — R293 Abnormal posture: Secondary | ICD-10-CM

## 2021-05-01 DIAGNOSIS — M25511 Pain in right shoulder: Secondary | ICD-10-CM | POA: Diagnosis not present

## 2021-05-01 NOTE — Therapy (Signed)
[place], [place], [address] Phone: [phone]   Fax:  [phone]  Physical Therapy Treatment / Re-certification  Patient Details  Name: Regina Watts MRN: [id number] Date of Birth: 05-13-1961 Referring Provider (PT): Tania Ade   Encounter Date: 05/01/2021   PT End of Session - 05/01/21 1411     Visit Number 17    Number of Visits 19    Date for PT Re-Evaluation 05/12/21    Authorization Type BCBS    PT Start Time 1410    PT Stop Time 3267    PT Time Calculation (min) 48 min    Activity Tolerance Patient limited by pain;Patient tolerated treatment well    Behavior During Therapy [facility] for tasks assessed/performed             Past Medical History:  Diagnosis Date   Breast cancer ([place]) JANUARY 2002   RIGHT BREAST T4 SENTINEL NODE NEGATIVE, HER2 3+, ER/PR NEGATIVE   Breast cancer ([place]) February 2015   Left Breast T1N0   Congenital deafness    Hx antineoplastic chemo    Hx of radiation therapy    Hyperlipidemia    Personal history of radiation therapy    Wears glasses    to drive    Past Surgical History:  Procedure Laterality Date   AUGMENTATION MAMMAPLASTY Left    breast lift    BREAST LUMPECTOMY Left 03/06/2014   BREAST LUMPECTOMY Right 11/2000   BREAST LUMPECTOMY WITH NEEDLE LOCALIZATION AND AXILLARY SENTINEL LYMPH NODE BX Left 03/06/2014   Procedure: BREAST LUMPECTOMY WITH NEEDLE LOCALIZATION AND AXILLARY SENTINEL LYMPH NODE BIOPSY;  Surgeon: Shann Medal, MD;  Location: [place];  Service: General;  Laterality: Left;   BREAST SURGERY  2002   lumpectomy on right side   CESAREAN SECTION     MASTOPEXY Left 09/07/2017   Procedure: LEFT BREAST MASTOPEXY;  Surgeon: Irene Limbo, MD;  Location: [place];  Service: Plastics;  Laterality: Left;   PORT-A-CATH REMOVAL Right 08/03/2014   Procedure: REMOVAL PORT-A-CATH;  Surgeon: Jackolyn Confer, MD;  Location: [facility];  Service: General;  Laterality: Right;   PORTACATH PLACEMENT Right 03/06/2014   Procedure: INSERTION PORT-A-CATH;  Surgeon: Shann Medal, MD;  Location: [place];  Service: General;  Laterality: Right;  subclavian   ROTATOR CUFF REPAIR Right 02/04/2021   TONSILLECTOMY      There were no vitals filed for this visit.   Subjective Assessment - 05/01/21 1412     Subjective " I saw the MD yesterday and he see's that he it is still stiff. He wants me to be seen at his office to continue with physical therapy. They did see me yesterday and moved my shoulder around and I am sore from it.    Patient Stated Goals to get stronger    Currently in Pain? No/denies    Pain Score 0-No pain    Pain Location Shoulder    Pain Descriptors / Indicators Aching    Pain Type Chronic pain    Pain Onset More than a month ago    Pain Frequency Intermittent    Aggravating Factors  from the shoulder                [facility] PT Assessment - 05/01/21 0001       AROM   Right Shoulder Extension 36 Degrees    Right Shoulder Flexion 87 Degrees    Right  Shoulder ABduction 80 Degrees    Right Shoulder Internal Rotation --   R SIJ   Right Shoulder External Rotation --   C6     Strength   Right Shoulder Flexion 3+/5   in available ROM   Right Shoulder Extension 4+/5   in available ROM   Right Shoulder ABduction 3+/5   in available ROM   Right Shoulder Internal Rotation 4-/5   in available ROM   Right Shoulder External Rotation 3+/5   in available ROM                          [facility] Adult PT Treatment/Exercise - 05/01/21 0001       Shoulder Exercises: Standing   External Rotation Strengthening;15 reps;Theraband    Theraband Level (Shoulder External Rotation) Level 3 (Green)    Internal Rotation 15 reps;Theraband    Theraband Level (Shoulder Internal Rotation) Level 3 (Green)    Flexion Strengthening;Right;10 reps;Weights    Shoulder  Flexion Weight (lbs) 3      Shoulder Exercises: ROM/Strengthening   UBE (Upper Arm Bike) L1 x 6 min   Fwd/Bwd x 3 min   Other ROM/Strengthening Exercises UE ranger flexion / scaption 2 x 10   combined with upward scapular rotation     Modalities   Modalities Cryotherapy      Cryotherapy   Number Minutes Cryotherapy 10 Minutes    Cryotherapy Location Shoulder    Type of Cryotherapy Ice pack   seated     Manual Therapy   Joint Mobilization GHJ grade IV PA/AP and inferior mobs    Scapular Mobilization scapular upward rotation during forward flexion AROM/ abduction                    PT Education - 05/01/21 1452     Education Details Reviewed HEP    Person(s) Educated Patient    Methods Explanation;Verbal cues    Comprehension Verbal cues required              PT Short Term Goals - 04/29/21 1425       PT SHORT TERM GOAL #1   Title pt to be IND with inital HEP    Period Weeks    Status Achieved      PT SHORT TERM GOAL #2   Title increase R shoulder AAROM to >/= 90 degrees flexion / abduction for therapuetic progression    Period Weeks    Status Achieved      PT SHORT TERM GOAL #3   Title pt to verbalize/ demo efficient posture to reduce and prevent R shoulder pain    Period Weeks    Status Achieved               PT Long Term Goals - 04/29/21 1426       PT LONG TERM GOAL #1   Title increase R shoulder AROM flexion/ abduction to >/= 120 degrees and IR/ER WFL compared bil for functional ROM required for ADLs with </=2/10 max pain    Period Weeks    Status On-going    Target Date 06/10/21      PT LONG TERM GOAL #2   Title increase R shoulder gross strengthe to >/= 4+/5 to promote scapular stability    Period Weeks    Status On-going    Target Date 06/10/21      PT LONG TERM GOAL #3   Title pt to be  able to lift and / lower >/= 15# from floor<>waist, and 8# to and  from waist to overhead for functional ROM And strength    Period Weeks     Status On-going    Target Date 06/10/21      PT LONG TERM GOAL #4   Title increase FOTO score to >/= 67% to demo improvement in function    Period Weeks    Status On-going    Target Date 06/10/21      PT LONG TERM GOAL #5   Title pt to be IND with all HEP given and will be able to maintain and progress current LOF IND    Period Weeks    Status On-going    Target Date 06/10/21                   Plan - 05/01/21 1411     Clinical Impression Statement pt saw her MD yesterday and reports concerns for her ROM, but noted she is making progress with strength. He recommended starting PT at his PT clinic and will be discharging from [facility] in the next 2 visits. focused session on GHJ and distal clavicle mobs. continued working on active assistive ROM using the upper extremity ranger combined with scapualr mobs which she did well with. plan to see pt for the next 2 visits to work on transitionin to her MD's PT clinic.    PT Frequency 2x / week    PT Duration 2 weeks    PT Treatment/Interventions ADLs/Self Care Home Management;Cryotherapy;Stair training;Functional mobility training;Therapeutic activities;Therapeutic exercise;Balance training;Neuromuscular re-education;Manual techniques;Patient/family education;Passive range of motion;Taping;Vasopneumatic Device    PT Next Visit Plan Continue PROM/AROM of the right shoulder, PRN for shoulder and scapular strengthening.    PT Home Exercise Plan [id number] - pendulums, table slides flexion/ abduction, scapular retraction, upper trap stretch. dowel press and supine flexion AAROm    Consulted and Agree with Plan of Care Patient             Patient will benefit from skilled therapeutic intervention in order to improve the following deficits and impairments:  Improper body mechanics, Increased muscle spasms, Decreased strength, Postural dysfunction, Pain, Decreased range of motion, Decreased endurance, Decreased activity tolerance, Increased  edema  Visit Diagnosis: Chronic right shoulder pain - Plan: PT plan of care cert/re-cert  Stiffness of right shoulder, not elsewhere classified - Plan: PT plan of care cert/re-cert  Abnormal posture - Plan: PT plan of care cert/re-cert  Localized edema - Plan: PT plan of care cert/re-cert     Problem List Patient Active Problem List   Diagnosis Date Noted   Vitamin D deficiency 01/04/2017   Breast cancer of upper-inner quadrant of left female breast ([place]) 09/02/2016   Thrombocytopenia ([place]) 10/09/2015   Lymphedema of breast 10/09/2015   Genetic testing 09/23/2015   Peripheral neuropathy due to chemotherapy ([place]) 08/14/2014   Cellulitis of chest wall 08/01/2014   Port-A-Cath in place 08/01/2014   Altered mental status 07/29/2014   Transaminasemia 07/29/2014   Conjunctivitis 07/29/2014   Deafness congenital 03/20/2014   History of right breast cancer 01/11/2014   Starr Lake PT, DPT, LAT, ATC  05/01/21  2:56 PM      [facility] [address] [place], [place], [address] Phone: [phone]   Fax:  [phone]  Name: Amiley Shishido MRN: [id number] Date of Birth: 1961/11/09

## 2021-05-06 ENCOUNTER — Other Ambulatory Visit: Payer: Self-pay

## 2021-05-06 ENCOUNTER — Encounter: Payer: Self-pay | Admitting: Physical Therapy

## 2021-05-06 ENCOUNTER — Ambulatory Visit: Payer: BC Managed Care – PPO | Admitting: Physical Therapy

## 2021-05-06 DIAGNOSIS — R293 Abnormal posture: Secondary | ICD-10-CM

## 2021-05-06 DIAGNOSIS — M25511 Pain in right shoulder: Secondary | ICD-10-CM | POA: Diagnosis not present

## 2021-05-06 DIAGNOSIS — M6281 Muscle weakness (generalized): Secondary | ICD-10-CM

## 2021-05-06 DIAGNOSIS — M25611 Stiffness of right shoulder, not elsewhere classified: Secondary | ICD-10-CM

## 2021-05-06 DIAGNOSIS — G8929 Other chronic pain: Secondary | ICD-10-CM | POA: Diagnosis not present

## 2021-05-06 DIAGNOSIS — R600 Localized edema: Secondary | ICD-10-CM

## 2021-05-06 NOTE — Therapy (Signed)
[place], [place], [address] Phone: [phone]   Fax:  [phone]  Physical Therapy Treatment  Patient Details  Name: Regina Watts MRN: [id number] Date of Birth: 07/12/1961 Referring Provider (PT): Tania Ade   Encounter Date: 05/06/2021   PT End of Session - 05/06/21 1418     Visit Number 18    Number of Visits 19    Date for PT Re-Evaluation 05/12/21    Authorization Type BCBS    Progress Note Due on Visit 20    PT Start Time 1417    PT Stop Time 1506    PT Time Calculation (min) 49 min    Activity Tolerance Patient limited by pain;Patient tolerated treatment well    Behavior During Therapy [facility] for tasks assessed/performed             Past Medical History:  Diagnosis Date   Breast cancer ([place]) JANUARY 2002   RIGHT BREAST T4 SENTINEL NODE NEGATIVE, HER2 3+, ER/PR NEGATIVE   Breast cancer ([place]) February 2015   Left Breast T1N0   Congenital deafness    Hx antineoplastic chemo    Hx of radiation therapy    Hyperlipidemia    Personal history of radiation therapy    Wears glasses    to drive    Past Surgical History:  Procedure Laterality Date   AUGMENTATION MAMMAPLASTY Left    breast lift    BREAST LUMPECTOMY Left 03/06/2014   BREAST LUMPECTOMY Right 11/2000   BREAST LUMPECTOMY WITH NEEDLE LOCALIZATION AND AXILLARY SENTINEL LYMPH NODE BX Left 03/06/2014   Procedure: BREAST LUMPECTOMY WITH NEEDLE LOCALIZATION AND AXILLARY SENTINEL LYMPH NODE BIOPSY;  Surgeon: Shann Medal, MD;  Location: [place];  Service: General;  Laterality: Left;   BREAST SURGERY  2002   lumpectomy on right side   CESAREAN SECTION     MASTOPEXY Left 09/07/2017   Procedure: LEFT BREAST MASTOPEXY;  Surgeon: Irene Limbo, MD;  Location: [place];  Service: Plastics;  Laterality: Left;   PORT-A-CATH REMOVAL Right 08/03/2014   Procedure: REMOVAL PORT-A-CATH;  Surgeon:  Jackolyn Confer, MD;  [facility];  Service: General;  Laterality: Right;   PORTACATH PLACEMENT Right 03/06/2014   Procedure: INSERTION PORT-A-CATH;  Surgeon: Shann Medal, MD;  Location: [place];  Service: General;  Laterality: Right;  subclavian   ROTATOR CUFF REPAIR Right 02/04/2021   TONSILLECTOMY      There were no vitals filed for this visit.   Subjective Assessment - 05/06/21 1420     Subjective "I am feeling about the same really, overall I am feeling fine."                [facility] PT Assessment - 05/06/21 0001       Assessment   Medical Diagnosis R RCR    Referring Provider (PT) Tania Ade                           [facility] Adult PT Treatment/Exercise - 05/06/21 0001       Shoulder Exercises: Standing   External Rotation Strengthening;15 reps;Theraband    Theraband Level (Shoulder External Rotation) Level 3 (Green)    Internal Rotation 15 reps;Theraband    Theraband Level (Shoulder Internal Rotation) Level 3 (Green)    Flexion Strengthening;Right;10 reps;Weights    Shoulder Flexion Weight (lbs) 4    Row Strengthening;Both;15 reps;Theraband  Theraband Level (Shoulder Row) Level 4 (Blue)      Shoulder Exercises: ROM/Strengthening   UBE (Upper Arm Bike) L1 x 6 min   Fwd/Bwd x 3 min   Other ROM/Strengthening Exercises UE ranger flexion / scaption 2 x 10   combined with upward scapular rotation     Modalities   Modalities Cryotherapy      Cryotherapy   Number Minutes Cryotherapy 10 Minutes    Cryotherapy Location Shoulder    Type of Cryotherapy Ice pack      Manual Therapy   Joint Mobilization GHJ grade IV PA/AP and inferior mobs, distal AC joint PA/ inferior mobs grade IV    Scapular Mobilization scapular upward rotation during forward flexion AROM/ abduction                      PT Short Term Goals - 04/29/21 1425       PT SHORT TERM GOAL #1   Title pt to be IND with inital HEP    Period Weeks     Status Achieved      PT SHORT TERM GOAL #2   Title increase R shoulder AAROM to >/= 90 degrees flexion / abduction for therapuetic progression    Period Weeks    Status Achieved      PT SHORT TERM GOAL #3   Title pt to verbalize/ demo efficient posture to reduce and prevent R shoulder pain    Period Weeks    Status Achieved               PT Long Term Goals - 04/29/21 1426       PT LONG TERM GOAL #1   Title increase R shoulder AROM flexion/ abduction to >/= 120 degrees and IR/ER WFL compared bil for functional ROM required for ADLs with </=2/10 max pain    Period Weeks    Status On-going    Target Date 06/10/21      PT LONG TERM GOAL #2   Title increase R shoulder gross strengthe to >/= 4+/5 to promote scapular stability    Period Weeks    Status On-going    Target Date 06/10/21      PT LONG TERM GOAL #3   Title pt to be able to lift and / lower >/= 15# from floor<>waist, and 8# to and  from waist to overhead for functional ROM And strength    Period Weeks    Status On-going    Target Date 06/10/21      PT LONG TERM GOAL #4   Title increase FOTO score to >/= 67% to demo improvement in function    Period Weeks    Status On-going    Target Date 06/10/21      PT LONG TERM GOAL #5   Title pt to be IND with all HEP given and will be able to maintain and progress current LOF IND    Period Weeks    Status On-going    Target Date 06/10/21                   Plan - 05/06/21 1432     Clinical Impression Statement pt arrives to session reporting she is doing about the same. continued working on aggressive ROM with GHJ, AC joint and scapular mobs combined with active ROM. continued workiong on shoulder strengthening increasing reps. She continues to demonstrate limited shoulder ROM secondary to pain located at the [facility] joint primarily. plan  to discharge next session which she plans to transition to [facility] for PT.    PT Next Visit Plan ROM , goals,  HEP, shoulder manual, discharge    PT Home Exercise Plan [email or web address] -    Consulted and Agree with Plan of Care Patient             Patient will benefit from skilled therapeutic intervention in order to improve the following deficits and impairments:  Improper body mechanics, Increased muscle spasms, Decreased strength, Postural dysfunction, Pain, Decreased range of motion, Decreased endurance, Decreased activity tolerance, Increased edema  Visit Diagnosis: Chronic right shoulder pain  Stiffness of right shoulder, not elsewhere classified  Abnormal posture  Localized edema  Muscle weakness (generalized)     Problem List Patient Active Problem List   Diagnosis Date Noted   Vitamin D deficiency 01/04/2017   Breast cancer of upper-inner quadrant of left female breast ([place]) 09/02/2016   Thrombocytopenia ([place]) 10/09/2015   Lymphedema of breast 10/09/2015   Genetic testing 09/23/2015   Peripheral neuropathy due to chemotherapy ([place]) 08/14/2014   Cellulitis of chest wall 08/01/2014   Port-A-Cath in place 08/01/2014   Altered mental status 07/29/2014   Transaminasemia 07/29/2014   Conjunctivitis 07/29/2014   Deafness congenital 03/20/2014   History of right breast cancer 01/11/2014   Starr Lake PT, DPT, LAT, ATC  05/06/21  2:58 PM      [facility] [address] [place], [place], [address] Phone: [phone]   Fax:  [phone]  Name: Regina Watts MRN: [id number] Date of Birth: 04-27-1961

## 2021-05-08 ENCOUNTER — Other Ambulatory Visit: Payer: Self-pay

## 2021-05-08 ENCOUNTER — Ambulatory Visit: Payer: BC Managed Care – PPO

## 2021-05-08 DIAGNOSIS — M25611 Stiffness of right shoulder, not elsewhere classified: Secondary | ICD-10-CM

## 2021-05-08 DIAGNOSIS — G8929 Other chronic pain: Secondary | ICD-10-CM | POA: Diagnosis not present

## 2021-05-08 DIAGNOSIS — R293 Abnormal posture: Secondary | ICD-10-CM | POA: Diagnosis not present

## 2021-05-08 DIAGNOSIS — M25511 Pain in right shoulder: Secondary | ICD-10-CM | POA: Diagnosis not present

## 2021-05-08 DIAGNOSIS — R600 Localized edema: Secondary | ICD-10-CM | POA: Diagnosis not present

## 2021-05-08 DIAGNOSIS — M6281 Muscle weakness (generalized): Secondary | ICD-10-CM | POA: Diagnosis not present

## 2021-05-08 NOTE — Patient Instructions (Addendum)
Access Code: [email or web address] URL: [email or web address] Date: 05/08/2021 Prepared by: Rich Number  Exercises [Seated Shoulder Pendulum Exercise - 1 x daily - 7 x weekly - 2 sets - 10 reps Shoulder Table Slides Flexion - 2 x daily - 10 reps - 3 sets - 5 hold Supine Shoulder Flexion Extension AAROM with Dowel - 1 x daily - 7 x weekly - 3 sets - 10 reps Shoulder Scaption AAROM with Dowel - 1 x daily - 7 x weekly - 2 sets - 10 reps Standing Shoulder Internal Rotation Stretch with Towel - 1 x daily - 7 x weekly - 1 sets - 10 reps - 5 hold Standing Shoulder External Rotation Stretch in Doorway - 1 x daily - 7 x weekly - 1 sets - 10 reps - 10 hold]  Shoulder Internal Rotation - 1 x daily - 7 x weekly - 2 sets - 10 reps Shoulder External Rotation - 1 x daily - 7 x weekly - 2 sets - 10 reps Standing Shoulder Row with Anchored Resistance - 1 x daily - 7 x weekly - 2 sets - 10 reps

## 2021-05-08 NOTE — Therapy (Signed)
[place], [place], [address] Phone: [phone]   Fax:  [phone]  Physical Therapy Treatment / Discharge  Patient Details  Name: Regina Watts MRN: [id number] Date of Birth: Sep 12, 1961 Referring Provider (PT): Tania Ade  PHYSICAL THERAPY DISCHARGE SUMMARY  Visits from Start of Care: 19  Current functional level related to goals / functional outcomes: Partially Met   Remaining deficits: Limited Right shoulder AROM, PROM, and strength.   Education / Equipment: HEP provided   Patient agrees to discharge. Patient goals were partially met. Patient is being discharged due to the physician's request.  Encounter Date: 05/08/2021   PT End of Session - 05/08/21 1421     Visit Number 19    Number of Visits 19    Date for PT Re-Evaluation 05/12/21    Authorization Type BCBS    Progress Note Due on Visit 20    PT Start Time 1415    PT Stop Time 1510    PT Time Calculation (min) 55 min    Activity Tolerance Patient limited by pain;Patient tolerated treatment well    Behavior During Therapy [facility] for tasks assessed/performed             Past Medical History:  Diagnosis Date   Breast cancer ([place]) JANUARY 2002   RIGHT BREAST T4 SENTINEL NODE NEGATIVE, HER2 3+, ER/PR NEGATIVE   Breast cancer ([place]) February 2015   Left Breast T1N0   Congenital deafness    Hx antineoplastic chemo    Hx of radiation therapy    Hyperlipidemia    Personal history of radiation therapy    Wears glasses    to drive    Past Surgical History:  Procedure Laterality Date   AUGMENTATION MAMMAPLASTY Left    breast lift    BREAST LUMPECTOMY Left 03/06/2014   BREAST LUMPECTOMY Right 11/2000   BREAST LUMPECTOMY WITH NEEDLE LOCALIZATION AND AXILLARY SENTINEL LYMPH NODE BX Left 03/06/2014   Procedure: BREAST LUMPECTOMY WITH NEEDLE LOCALIZATION AND AXILLARY SENTINEL LYMPH NODE BIOPSY;  Surgeon: Shann Medal, MD;   Location: [place];  Service: General;  Laterality: Left;   BREAST SURGERY  2002   lumpectomy on right side   CESAREAN SECTION     MASTOPEXY Left 09/07/2017   Procedure: LEFT BREAST MASTOPEXY;  Surgeon: Irene Limbo, MD;  Location: [place];  Service: Plastics;  Laterality: Left;   PORT-A-CATH REMOVAL Right 08/03/2014   Procedure: REMOVAL PORT-A-CATH;  Surgeon: Jackolyn Confer, MD;  Location: [facility];  Service: General;  Laterality: Right;   PORTACATH PLACEMENT Right 03/06/2014   Procedure: INSERTION PORT-A-CATH;  Surgeon: Shann Medal, MD;  Location: [place];  Service: General;  Laterality: Right;  subclavian   ROTATOR CUFF REPAIR Right 02/04/2021   TONSILLECTOMY      There were no vitals filed for this visit.   Subjective Assessment - 05/08/21 1519     Subjective The patient reports that she is feeling fine.  Today is her last appointment here.    Patient is accompained by: Interpreter    Limitations House hold activities;Other (comment)   reaching and self care   Patient Stated Goals to get stronger    Currently in Pain? No/denies                [facility] PT Assessment - 05/08/21 0001       Assessment   Medical Diagnosis R RCR    Referring Provider (  PT) Tania Ade      Observation/Other Assessments   Focus on Therapeutic Outcomes (FOTO)  Shoulder 53%      AROM   Right Shoulder Extension 36 Degrees    Right Shoulder Flexion 90 Degrees    Right Shoulder ABduction 80 Degrees    Right Shoulder Internal Rotation --   R SIJ   Right Shoulder External Rotation --   C6     PROM   Right Shoulder Flexion 110 Degrees    Right Shoulder ABduction 115 Degrees   Scaption   Right Shoulder Internal Rotation 50 Degrees    Right Shoulder External Rotation 30 Degrees      Strength   Right Shoulder Flexion 4/5   in available ROM   Right Shoulder Extension 4+/5   in available ROM   Right Shoulder ABduction 4-/5   in  available ROM   Right Shoulder Internal Rotation 4/5   in available ROM   Right Shoulder External Rotation 3+/5   in available ROM                          [facility] Adult PT Treatment/Exercise - 05/08/21 0001       Shoulder Exercises: Supine   External Rotation AAROM;15 reps    Flexion AAROM;15 reps      Shoulder Exercises: Pulleys   Flexion 2 minutes    Scaption 2 minutes      Shoulder Exercises: Stretch   Internal Rotation Stretch 10 seconds    Internal Rotation Stretch Limitations with strap    External Rotation Stretch 10 seconds;5 reps      Manual Therapy   Joint Mobilization GHJ distraction grade IV, Inferior mobs grade IV, Posterior mobs grade IV    Passive ROM Scaption and ER with grade II distraction                    PT Education - 05/08/21 1514     Education Details Exercises to Focus on at home    Person(s) Educated Patient    Methods Explanation;Handout    Comprehension Verbalized understanding              PT Short Term Goals - 05/08/21 1421       PT SHORT TERM GOAL #1   Title pt to be IND with inital HEP    Period Weeks    Status Achieved      PT SHORT TERM GOAL #2   Title increase R shoulder AAROM to >/= 90 degrees flexion / abduction for therapuetic progression    Period Weeks    Status Achieved      PT SHORT TERM GOAL #3   Title pt to verbalize/ demo efficient posture to reduce and prevent R shoulder pain    Period Weeks    Status Achieved               PT Long Term Goals - 05/08/21 1421       PT LONG TERM GOAL #1   Title increase R shoulder AROM flexion/ abduction to >/= 120 degrees and IR/ER WFL compared bil for functional ROM required for ADLs with </=2/10 max pain    Period Weeks    Status Partially Met      PT LONG TERM GOAL #2   Title increase R shoulder gross strengthe to >/= 4+/5 to promote scapular stability    Period Weeks    Status Partially Met  PT LONG TERM GOAL #3   Title pt to  be able to lift and / lower >/= 15# from floor<>waist, and 8# to and  from waist to overhead for functional ROM And strength    Period Weeks    Status Partially Met      PT LONG TERM GOAL #4   Title increase FOTO score to >/= 67% to demo improvement in function    Period Weeks    Status Partially Met      PT LONG TERM GOAL #5   Title pt to be IND with all HEP given and will be able to maintain and progress current LOF IND    Period Weeks    Status Partially Met                   Plan - 05/08/21 1502     Clinical Impression Statement Annick has attended 19 visits of physical therapy post right rotator cuff repair.  She is making slow progress in therapy in regards to her range of motion.  Currently she has 110 degrees into passive flexion and 115 degrees of right shoulder passive scaption motion.  Her external rotation at 20 degrees of abduction is 30 degrees.  The patient is limited with active elevation to 90 degrees of flexion.  She is able to reach behind the back to the right SIJ and behind the head to C6.  The patient presents with shoulder flexion strength 4/5 within the available range of motion.  The patient will be discharged from this location per the referring MD.    Personal Factors and Comorbidities Comorbidity 2    Comorbidities hx of Cx, DM    Examination-Activity Limitations Lift;Reach Overhead;Dressing;Bathing    PT Treatment/Interventions Patient/family education    PT Next Visit Plan Discharge per the referring MD    PT [place] and Agree with Plan of Care Patient             Patient will benefit from skilled therapeutic intervention in order to improve the following deficits and impairments:  Improper body mechanics, Increased muscle spasms, Decreased strength, Postural dysfunction, Pain, Decreased range of motion, Decreased endurance, Decreased activity tolerance, Increased edema  Visit Diagnosis: Chronic right shoulder  pain  Stiffness of right shoulder, not elsewhere classified     Problem List Patient Active Problem List   Diagnosis Date Noted   Vitamin D deficiency 01/04/2017   Breast cancer of upper-inner quadrant of left female breast ([place]) 09/02/2016   Thrombocytopenia ([place]) 10/09/2015   Lymphedema of breast 10/09/2015   Genetic testing 09/23/2015   Peripheral neuropathy due to chemotherapy ([place]) 08/14/2014   Cellulitis of chest wall 08/01/2014   Port-A-Cath in place 08/01/2014   Altered mental status 07/29/2014   Transaminasemia 07/29/2014   Conjunctivitis 07/29/2014   Deafness congenital 03/20/2014   History of right breast cancer 01/11/2014   Rich Number, PT, DPT, OCS, Crt. DN  Bethena Midget 05/08/2021, 3:29 PM  [facility] [address] [place], [place], [address] Phone: [phone]   Fax:  [phone]  Name: Aurianna Earlywine MRN: [id number] Date of Birth: 1960-11-24

## 2021-05-13 ENCOUNTER — Encounter: Payer: BC Managed Care – PPO | Admitting: Physical Therapy

## 2021-06-06 ENCOUNTER — Telehealth: Payer: Self-pay

## 2021-06-06 NOTE — Telephone Encounter (Signed)
The patient previously cancelled her mammogram with [facility] due to the fact that she did not want a sign language interpreter and [facility] was requesting that a sign language interpretor be present with her. I contacted the patient to try and see what accommodations that she needed in order to have her mammogram rescheduled and completed. I was unable to reach her and had to leave her a voicemail on her main contact number (interpretor). I requested that the patient give [place] a call back regarding this matter.

## 2021-06-06 NOTE — Telephone Encounter (Signed)
-----   Message from Alla Feeling, NP sent at 06/05/2021  2:45 PM EDT ----- Myriam Jacobson,  I got your note that patient left breast center because she did not want a SL interpreter. Pt needs to let us (and breast center) know what she needs in order to complete the mammogram.   Thanks, Regan Rakers

## 2021-06-11 DIAGNOSIS — Z09 Encounter for follow-up examination after completed treatment for conditions other than malignant neoplasm: Secondary | ICD-10-CM | POA: Diagnosis not present

## 2021-07-01 ENCOUNTER — Other Ambulatory Visit: Payer: Medicare Other

## 2021-07-01 ENCOUNTER — Ambulatory Visit: Payer: Medicare Other | Admitting: Nurse Practitioner

## 2021-07-01 ENCOUNTER — Telehealth: Payer: Self-pay | Admitting: Nurse Practitioner

## 2021-07-01 NOTE — Progress Notes (Deleted)
Regina Watts   [phone] [phone]   Clinic Follow up Note   Patient Care Team: Wenda Low, MD as PCP - General (Internal Medicine) Gordy Levan, MD as Consulting Physician (Oncology) Tyler Pita, MD as Consulting Physician (Radiation Oncology) Alphonsa Overall, MD as Consulting Physician (General Surgery) 07/01/2021  CHIEF COMPLAINT: Follow up triple negative left breast cancer   SUMMARY OF ONCOLOGIC HISTORY: Oncology History  Breast cancer of upper-inner quadrant of left female breast ([place])  12/2013 Initial Diagnosis   Triple negative malignant neoplasm of breast ([place])   01/01/2014 Pathology Results   Breast, left, needle core biopsy, mass, 10 o'clock - INVASIVE DUCTAL CARCINOMA.   01/30/2014 Genetic Testing   PMS2 c.1309C>T, ATM c.8674G>A and ATM [phone] VUS's found on the Breast/Ovarian cancer panel.  The ATM [phone] VUS was identified as a mosaic result. The Breast/Ovarian gene panel offered by [facility] includes sequencing and rearrangement analysis for the following 21 genes:  ATM, BARD1, BRCA1, BRCA2, BRIP1, CDH1, CHEK2, EPCAM, FANCC, MLH1, MSH2, MSH6, NBN, PALB2, PMS2, PTEN, RAD51C, RAD51D, STK11, TP53, and XRCC2.     UPDATE: PMS2 c.1309C>T VUS has been reclassified from a variant of uncertain significance to a likely benign variant based on a combination of sources, e.g., internal data, published literature, population databases and in [place].  The updated report date is Apr 08, 2017.    03/06/2014 Surgery   S/p Lumpectomy by Dr. Lucia Gaskins at [facility].    03/06/2014 Pathology Results   1. Lymph node, sentinel, biopsy, Left axillary - ONE BENIGN LYMPH NODE WITH NO TUMOR SEEN (0/1). 2. Breast, lumpectomy, Left - INVASIVE GRADE II DUCTAL CARCINOMA SPANNING 1.8 CM IN GREATEST DIMENSION. - INVASIVE TUMOR IS EXTREMELY CLOSE (0.1 CM) TO POSTERIOR MARGIN ON INITIAL LUMPECTOMY SPECIMEN, PLEASE SEE SPECIMEN # 3 FOR  FINAL MARGIN STATUS. - OTHER MARGINS ARE NEGATIVE. - SEE ONCOLOGY TEMPLATE. 3. Breast, excision, Left posterior margin - BENIGN BREAST PARENCHYMA. - NO TUMOR SEEN.   03/06/2014 Receptors her2   Estrogen receptor: negative. Progesterone receptor: negative. HER-2: negative   03/30/2014 - 04/14/2014 Chemotherapy   The patient had adjuvant chemotherapy treatment, Cytoxan and Adriamycin for 4 cycles then Taxol for 12 weeks    10/22/2014 - 12/11/2014 Radiation Therapy    The patient saw Dr. Tammi Klippel for radiation treatment. This is the current list of radiation treatment:  1.  The whole left breast was treated to 50.4 Gy in 28 fractions of 1.8 Gy 2.  The lumpectomy surgical site was treated to a total dose of 60.4 Gy with 5 additional fractions of 2 Gy   09/07/2017 Surgery   LEFT BREAST MASTOPEXY by Dr. Iran Planas   01/20/2018 Mammogram   IMPRESSION: No evidence of malignancy bilaterally. Bilateral lumpectomy changes   04/01/2020 Pathology Results   Breast, left, needle core biopsy, upper outer quadrant, 1 o'clock, 1cm from nipple - FAT NECROSIS WITH ASSOCIATED FIBROSIS. SEE NOTE - NEGATIVE FOR CARCINOMA     CURRENT THERAPY: Surveillance   INTERVAL HISTORY: Ms. Zellars returns for follow up as scheduled. She was last seen by me 07/01/20. She is overdue for mammogram, she cancelled at the breast center because she did not want a SL interpreter and they requested that she have one. We have tried to reach her to make accommodations.    REVIEW OF SYSTEMS:   Constitutional: Denies fevers, chills or abnormal weight loss Eyes: Denies blurriness of vision Ears, nose, mouth, throat, and face: Denies mucositis or sore throat Respiratory:  Denies cough, dyspnea or wheezes Cardiovascular: Denies palpitation, chest discomfort or lower extremity swelling Gastrointestinal:  Denies nausea, heartburn or change in bowel habits Skin: Denies abnormal skin rashes Lymphatics: Denies new lymphadenopathy or  easy bruising Neurological:Denies numbness, tingling or new weaknesses Behavioral/Psych: Mood is stable, no new changes  All other systems were reviewed with the patient and are negative.  MEDICAL HISTORY:  Past Medical History:  Diagnosis Date   Breast cancer ([place]) JANUARY 2002   RIGHT BREAST T4 SENTINEL NODE NEGATIVE, HER2 3+, ER/PR NEGATIVE   Breast cancer ([place]) February 2015   Left Breast T1N0   Congenital deafness    Hx antineoplastic chemo    Hx of radiation therapy    Hyperlipidemia    Personal history of radiation therapy    Wears glasses    to drive    SURGICAL HISTORY: Past Surgical History:  Procedure Laterality Date   AUGMENTATION MAMMAPLASTY Left    breast lift    BREAST LUMPECTOMY Left 03/06/2014   BREAST LUMPECTOMY Right 11/2000   BREAST LUMPECTOMY WITH NEEDLE LOCALIZATION AND AXILLARY SENTINEL LYMPH NODE BX Left 03/06/2014   Procedure: BREAST LUMPECTOMY WITH NEEDLE LOCALIZATION AND AXILLARY SENTINEL LYMPH NODE BIOPSY;  Surgeon: Shann Medal, MD;  Location: [place];  Service: General;  Laterality: Left;   BREAST SURGERY  2002   lumpectomy on right side   CESAREAN SECTION     MASTOPEXY Left 09/07/2017   Procedure: LEFT BREAST MASTOPEXY;  Surgeon: Irene Limbo, MD;  Location: [place];  Service: Plastics;  Laterality: Left;   PORT-A-CATH REMOVAL Right 08/03/2014   Procedure: REMOVAL PORT-A-CATH;  Surgeon: Jackolyn Confer, MD;  [facility];  Service: General;  Laterality: Right;   PORTACATH PLACEMENT Right 03/06/2014   Procedure: INSERTION PORT-A-CATH;  Surgeon: Shann Medal, MD;  Location: [place];  Service: General;  Laterality: Right;  subclavian   ROTATOR CUFF REPAIR Right 02/04/2021   TONSILLECTOMY      I have reviewed the social history and family history with the patient and they are unchanged from previous note.  ALLERGIES:  is allergic to taxotere [docetaxel].  MEDICATIONS:  Current  Outpatient Medications  Medication Sig Dispense Refill   atorvastatin (LIPITOR) 40 MG tablet Take 40 mg by mouth daily.  3   empagliflozin (JARDIANCE) 25 MG TABS tablet Take 25 mg by mouth daily.     metFORMIN (GLUCOPHAGE) 500 MG tablet TAKE 1 TABLET BY MOUTH EVERY DAY WITH MEALS (Patient not taking: Reported on 02/17/2021)  6   pyridOXINE (VITAMIN B-6) 50 MG tablet Take 1 tablet (50 mg total) by mouth 2 (two) times daily. 60 tablet 5   sertraline (ZOLOFT) 100 MG tablet Take 1 tablet (100 mg total) by mouth daily. 30 tablet 3   No current facility-administered medications for this visit.    PHYSICAL EXAMINATION: ECOG PERFORMANCE STATUS: {CHL ONC ECOG [phone]}  There were no vitals filed for this visit. There were no vitals filed for this visit.  GENERAL:alert, no distress and comfortable SKIN: skin color, texture, turgor are normal, no rashes or significant lesions EYES: normal, Conjunctiva are pink and non-injected, sclera clear OROPHARYNX:no exudate, no erythema and lips, buccal mucosa, and tongue normal  NECK: supple, thyroid normal size, non-tender, without nodularity LYMPH:  no palpable lymphadenopathy in the cervical, axillary or inguinal LUNGS: clear to auscultation and percussion with normal breathing effort HEART: regular rate & rhythm and no murmurs and no lower extremity edema ABDOMEN:abdomen soft, non-tender  and normal bowel sounds Musculoskeletal:no cyanosis of digits and no clubbing  NEURO: alert & oriented x 3 with fluent speech, no focal motor/sensory deficits  LABORATORY DATA:  I have reviewed the data as listed CBC Latest Ref Rng & Units 07/01/2020 07/21/2019 07/06/2018  WBC 4.0 - 10.5 K/uL 4.9 6.8 4.9  Hemoglobin 12.0 - 15.0 g/dL 14.5 13.4 14.2  Hematocrit 36.0 - 46.0 % 41.8 39.7 42.6  Platelets 150 - 400 K/uL 150 150 160     CMP Latest Ref Rng & Units 07/01/2020 07/21/2019 07/06/2018  Glucose 70 - 99 mg/dL 176(H) 133(H) 168(H)  BUN 6 - 20 mg/dL _0 Creatinine 0.44 - 1.00 mg/dL 0.89 0.94 0.92  Sodium 135 - 145 mmol/L 141 143 141  Potassium 3.5 - 5.1 mmol/L 4.4 3.9 4.4  Chloride 98 - 111 mmol/L 107 107 104  CO2 22 - 32 mmol/L _1 Calcium 8.9 - 10.3 mg/dL 10.2 9.5 10.7(H)  Total Protein 6.5 - 8.1 g/dL 7.2 6.8 7.3  Total Bilirubin 0.3 - 1.2 mg/dL 0.9 0.5 0.5  Alkaline Phos 38 - 126 U/L 118 121 111  AST 15 - 41 U/L 31 29 50(H)  ALT 0 - 44 U/L 53(H) 46(H) 79(H)      RADIOGRAPHIC STUDIES: I have personally reviewed the radiological images as listed and agreed with the findings in the report. No results found.   ASSESSMENT & PLAN:  No problem-specific Assessment & Plan notes found for this encounter.   No orders of the defined types were placed in this encounter.  All questions were answered. The patient knows to call the clinic with any problems, questions or concerns. No barriers to learning was detected. I spent {CHL ONC TIME VISIT - ZLYTS:[id number]} counseling the patient face to face. The total time spent in the appointment was {CHL ONC TIME VISIT - BEQUH:[id number]} and more than 50% was on counseling and review of test results     Alla Feeling, NP 07/01/21

## 2021-07-01 NOTE — Telephone Encounter (Signed)
R/s appts per 8/23 sch msg. Called pt, someone answered, but kept hanging up. Will try to contact pt again tomorrow.

## 2021-07-02 ENCOUNTER — Telehealth: Payer: Self-pay | Admitting: Nurse Practitioner

## 2021-07-02 NOTE — Telephone Encounter (Signed)
R/s appts per 8/23 sch msg. Called pt using mobile number first. Someone answered, but it kept hanging up. I called house number and was able to leave a msg with appt date and time.

## 2021-07-08 ENCOUNTER — Encounter: Payer: Self-pay | Admitting: Nurse Practitioner

## 2021-07-22 ENCOUNTER — Ambulatory Visit: Payer: Medicare Other | Admitting: Nurse Practitioner

## 2021-07-22 ENCOUNTER — Other Ambulatory Visit: Payer: Medicare Other

## 2021-07-23 ENCOUNTER — Other Ambulatory Visit: Payer: Self-pay

## 2021-07-23 ENCOUNTER — Inpatient Hospital Stay: Payer: BC Managed Care – PPO | Attending: Hematology

## 2021-07-23 ENCOUNTER — Encounter: Payer: Self-pay | Admitting: Hematology

## 2021-07-23 ENCOUNTER — Inpatient Hospital Stay (HOSPITAL_BASED_OUTPATIENT_CLINIC_OR_DEPARTMENT_OTHER): Payer: BC Managed Care – PPO | Admitting: Hematology

## 2021-07-23 VITALS — BP 135/80 | HR 73 | Temp 98.2°F | Resp 18 | Ht 67.0 in | Wt 163.6 lb

## 2021-07-23 DIAGNOSIS — E559 Vitamin D deficiency, unspecified: Secondary | ICD-10-CM | POA: Diagnosis not present

## 2021-07-23 DIAGNOSIS — Z171 Estrogen receptor negative status [ER-]: Secondary | ICD-10-CM | POA: Insufficient documentation

## 2021-07-23 DIAGNOSIS — C50912 Malignant neoplasm of unspecified site of left female breast: Secondary | ICD-10-CM | POA: Diagnosis not present

## 2021-07-23 DIAGNOSIS — Z79899 Other long term (current) drug therapy: Secondary | ICD-10-CM | POA: Insufficient documentation

## 2021-07-23 DIAGNOSIS — H905 Unspecified sensorineural hearing loss: Secondary | ICD-10-CM | POA: Diagnosis not present

## 2021-07-23 DIAGNOSIS — C50212 Malignant neoplasm of upper-inner quadrant of left female breast: Secondary | ICD-10-CM | POA: Diagnosis not present

## 2021-07-23 DIAGNOSIS — Z9221 Personal history of antineoplastic chemotherapy: Secondary | ICD-10-CM | POA: Diagnosis not present

## 2021-07-23 DIAGNOSIS — Z923 Personal history of irradiation: Secondary | ICD-10-CM | POA: Diagnosis not present

## 2021-07-23 LAB — CBC WITH DIFFERENTIAL/PLATELET
Abs Immature Granulocytes: 0.02 10*3/uL (ref 0.00–0.07)
Basophils Absolute: 0 10*3/uL (ref 0.0–0.1)
Basophils Relative: 1 %
Eosinophils Absolute: 0.1 10*3/uL (ref 0.0–0.5)
Eosinophils Relative: 2 %
HCT: 46.4 % — ABNORMAL HIGH (ref 36.0–46.0)
Hemoglobin: 15.7 g/dL — ABNORMAL HIGH (ref 12.0–15.0)
Immature Granulocytes: 0 %
Lymphocytes Relative: 24 %
Lymphs Abs: 1.2 10*3/uL (ref 0.7–4.0)
MCH: 30.5 pg (ref 26.0–34.0)
MCHC: 33.8 g/dL (ref 30.0–36.0)
MCV: 90.3 fL (ref 80.0–100.0)
Monocytes Absolute: 0.3 10*3/uL (ref 0.1–1.0)
Monocytes Relative: 6 %
Neutro Abs: 3.4 10*3/uL (ref 1.7–7.7)
Neutrophils Relative %: 67 %
Platelets: 148 10*3/uL — ABNORMAL LOW (ref 150–400)
RBC: 5.14 MIL/uL — ABNORMAL HIGH (ref 3.87–5.11)
RDW: 13.7 % (ref 11.5–15.5)
WBC: 5.1 10*3/uL (ref 4.0–10.5)
nRBC: 0 % (ref 0.0–0.2)

## 2021-07-23 LAB — COMPREHENSIVE METABOLIC PANEL
ALT: 71 U/L — ABNORMAL HIGH (ref 0–44)
AST: 31 U/L (ref 15–41)
Albumin: 4.1 g/dL (ref 3.5–5.0)
Alkaline Phosphatase: 132 U/L — ABNORMAL HIGH (ref 38–126)
Anion gap: 9 (ref 5–15)
BUN: 17 mg/dL (ref 6–20)
CO2: 22 mmol/L (ref 22–32)
Calcium: 9.8 mg/dL (ref 8.9–10.3)
Chloride: 109 mmol/L (ref 98–111)
Creatinine, Ser: 0.83 mg/dL (ref 0.44–1.00)
GFR, Estimated: >60 mL/min (ref >60)
Glucose, Bld: 146 mg/dL — ABNORMAL HIGH (ref 70–99)
Potassium: 4.4 mmol/L (ref 3.5–5.1)
Sodium: 140 mmol/L (ref 135–145)
Total Bilirubin: 0.8 mg/dL (ref 0.3–1.2)
Total Protein: 7.2 g/dL (ref 6.5–8.1)

## 2021-07-23 LAB — VITAMIN D 25 HYDROXY (VIT D DEFICIENCY, FRACTURES): Vit D, 25-Hydroxy: 25.79 ng/mL — ABNORMAL LOW (ref 30–100)

## 2021-07-23 NOTE — Progress Notes (Signed)
[name]   Telephone:([phone] Fax:([phone]   Clinic Follow up Note   Patient Care Team: Wenda Low, MD as PCP - General (Internal Medicine) Gordy Levan, MD as Consulting Physician (Oncology) Tyler Pita, MD as Consulting Physician (Radiation Oncology) Alphonsa Overall, MD as Consulting Physician (General Surgery)  Date of Service:  07/23/2021  CHIEF COMPLAINT: f/u of left breast cancer, triple negative  CURRENT THERAPY:  Surveillance  ASSESSMENT & PLAN:  [name] is a 60 y.o. female with   1.Triple negative left breast cancer T1cN0 diagnosed 12-2013, history of stage II right breast cancer HR-/HER2+ in 2002 -She has history of T2N0M0 right breast cancer 11/2000 at age 46 treated with right lumpectomy, 2 sentinel nodes biopsy and chemotherapy -She had a history of triple negative left breast cancer in 2015, stage IA, treated with left lumpectomy and sentinel node evaluation 03/06/14. S/p Adjuvant chemotherapy completed in 09/10/14 and adjuvant RT completed 12/11/14 -her 02/2020 mammogram showed indeterminate left breast mass. Biopsy on 04/01/20 showed fat necrosis with fibrosis.  -She reports she went to [facility] yesterday. They have not sent [place] the report. Per pt, results were negative. -she is clinically doing well. Physical exam shows continued scar tissue to left breast, no concern. -continue surveillance. I discussed releasing her to her PCP, but she prefers to continue f/u with me for peace of mind. I will continue to see her annually.   2. Congenital deafness -Communicates by reading/writing and sign language.    3. Genetics  -multiple genetic variants of unknown significance on genetic testing 01/2014    4. Peripheral neuropathy  -related to prior taxanes -resolved in feet, mild and intermittent in fingertips. Functions well. -not mentioned today (07/23/21)   5. Hepatic steatosis and mild transaminitis -Her prior CT abdomen and  liver MRI in 2015 showed hepatic stenosis and a 1.2cm triangular subcapsular medial segment of left hepatic lobe area of enhancement, which is unchanged since 2008, likely represent perfusion phenomena.  -She has some mild intermittent transaminitis since 2015, likely segments to hepatic steatosis, overall stable -labs today AST 31, ALT 71, Alk Phos 132 (07/24/21)   6. DM -on metformin, f/u PCP    7. Vitamin D Deficiency  -intermittently low  -results today are pending. I will call if results are abnormal.   PLAN: -she is doing well, continue cancer surveillance  -I will call with any abnormal lab results -labs and f/u in 1 year   No problem-specific Assessment & Plan notes found for this encounter.   SUMMARY OF ONCOLOGIC HISTORY: Oncology History  Breast cancer of upper-inner quadrant of left female breast ([place])  12/2013 Initial Diagnosis   Triple negative malignant neoplasm of breast ([place])   01/01/2014 Pathology Results   Breast, left, needle core biopsy, mass, 10 o'clock - INVASIVE DUCTAL CARCINOMA.   01/30/2014 Genetic Testing   PMS2 c.1309C>T, ATM c.8674G>A and ATM [phone] VUS's found on the Breast/Ovarian cancer panel.  The ATM [phone] VUS was identified as a mosaic result. The Breast/Ovarian gene panel offered by [facility] includes sequencing and rearrangement analysis for the following 21 genes:  ATM, BARD1, BRCA1, BRCA2, BRIP1, CDH1, CHEK2, EPCAM, FANCC, MLH1, MSH2, MSH6, NBN, PALB2, PMS2, PTEN, RAD51C, RAD51D, STK11, TP53, and XRCC2.     UPDATE: PMS2 c.1309C>T VUS has been reclassified from a variant of uncertain significance to a likely benign variant based on a combination of sources, e.g., internal data, published literature, population databases and in [place].  The updated report  date is Apr 08, 2017.    03/06/2014 Surgery   S/p Lumpectomy by Dr. Lucia Gaskins at [facility].    03/06/2014 Pathology Results   1. Lymph node, sentinel, biopsy, Left  axillary - ONE BENIGN LYMPH NODE WITH NO TUMOR SEEN (0/1). 2. Breast, lumpectomy, Left - INVASIVE GRADE II DUCTAL CARCINOMA SPANNING 1.8 CM IN GREATEST DIMENSION. - INVASIVE TUMOR IS EXTREMELY CLOSE (0.1 CM) TO POSTERIOR MARGIN ON INITIAL LUMPECTOMY SPECIMEN, PLEASE SEE SPECIMEN # 3 FOR FINAL MARGIN STATUS. - OTHER MARGINS ARE NEGATIVE. - SEE ONCOLOGY TEMPLATE. 3. Breast, excision, Left posterior margin - BENIGN BREAST PARENCHYMA. - NO TUMOR SEEN.   03/06/2014 Receptors her2   Estrogen receptor: negative. Progesterone receptor: negative. HER-2: negative   03/30/2014 - 04/14/2014 Chemotherapy   The patient had adjuvant chemotherapy treatment, Cytoxan and Adriamycin for 4 cycles then Taxol for 12 weeks    10/22/2014 - 12/11/2014 Radiation Therapy    The patient saw Dr. Tammi Klippel for radiation treatment. This is the current list of radiation treatment:  1.  The whole left breast was treated to 50.4 Gy in 28 fractions of 1.8 Gy 2.  The lumpectomy surgical site was treated to a total dose of 60.4 Gy with 5 additional fractions of 2 Gy   09/07/2017 Surgery   LEFT BREAST MASTOPEXY by Dr. Iran Planas   01/20/2018 Mammogram   IMPRESSION: No evidence of malignancy bilaterally. Bilateral lumpectomy changes   04/01/2020 Pathology Results   Breast, left, needle core biopsy, upper outer quadrant, 1 o'clock, 1cm from nipple - FAT NECROSIS WITH ASSOCIATED FIBROSIS. SEE NOTE - NEGATIVE FOR CARCINOMA      INTERVAL HISTORY:  Regina Watts is here for a follow up of breast cancer. She was last seen by NP Lacie on 07/01/20. She presents to the clinic accompanied by an interpreter. She reports she is doing well. I asked about her recent mammography. Per communication from [place], she previously cancelled appointment with [facility] due to not wanting an ASL interpreter. Per patient, she was not given an interpreter and was instead asked to write back and forth. She declined and opted  to go to [place] instead, where she was given an interpreter. She reports some tenderness/pain to the left breast, with a palpable nodule.    All other systems were reviewed with the patient and are negative.  MEDICAL HISTORY:  Past Medical History:  Diagnosis Date   Breast cancer ([place]) JANUARY 2002   RIGHT BREAST T4 SENTINEL NODE NEGATIVE, HER2 3+, ER/PR NEGATIVE   Breast cancer ([place]) February 2015   Left Breast T1N0   Congenital deafness    Hx antineoplastic chemo    Hx of radiation therapy    Hyperlipidemia    Personal history of radiation therapy    Wears glasses    to drive    SURGICAL HISTORY: Past Surgical History:  Procedure Laterality Date   AUGMENTATION MAMMAPLASTY Left    breast lift    BREAST LUMPECTOMY Left 03/06/2014   BREAST LUMPECTOMY Right 11/2000   BREAST LUMPECTOMY WITH NEEDLE LOCALIZATION AND AXILLARY SENTINEL LYMPH NODE BX Left 03/06/2014   Procedure: BREAST LUMPECTOMY WITH NEEDLE LOCALIZATION AND AXILLARY SENTINEL LYMPH NODE BIOPSY;  Surgeon: Shann Medal, MD;  Location: [place];  Service: General;  Laterality: Left;   BREAST SURGERY  2002   lumpectomy on right side   CESAREAN SECTION     MASTOPEXY Left 09/07/2017   Procedure: LEFT BREAST MASTOPEXY;  Surgeon: Irene Limbo,  MD;  Location: [place];  Service: Plastics;  Laterality: Left;   PORT-A-CATH REMOVAL Right 08/03/2014   Procedure: REMOVAL PORT-A-CATH;  Surgeon: Jackolyn Confer, MD;  Location: [facility];  Service: General;  Laterality: Right;   PORTACATH PLACEMENT Right 03/06/2014   Procedure: INSERTION PORT-A-CATH;  Surgeon: Shann Medal, MD;  Location: [place];  Service: General;  Laterality: Right;  subclavian   ROTATOR CUFF REPAIR Right 02/04/2021   TONSILLECTOMY      I have reviewed the social history and family history with the patient and they are unchanged from previous note.  ALLERGIES:  is allergic to taxotere  [docetaxel].  MEDICATIONS:  Current Outpatient Medications  Medication Sig Dispense Refill   atorvastatin (LIPITOR) 40 MG tablet Take 40 mg by mouth daily.  3   empagliflozin (JARDIANCE) 25 MG TABS tablet Take 25 mg by mouth daily.     metFORMIN (GLUCOPHAGE) 500 MG tablet TAKE 1 TABLET BY MOUTH EVERY DAY WITH MEALS (Patient not taking: Reported on 02/17/2021)  6   pyridOXINE (VITAMIN B-6) 50 MG tablet Take 1 tablet (50 mg total) by mouth 2 (two) times daily. 60 tablet 5   sertraline (ZOLOFT) 100 MG tablet Take 1 tablet (100 mg total) by mouth daily. 30 tablet 3   No current facility-administered medications for this visit.    PHYSICAL EXAMINATION: ECOG PERFORMANCE STATUS: 0 - Asymptomatic  Vitals:   07/23/21 1316  BP: 135/80  Pulse: 73  Resp: 18  Temp: 98.2 F (36.8 C)  SpO2: 96%   Wt Readings from Last 3 Encounters:  07/23/21 163 lb 9.6 oz (74.2 kg)  07/01/20 170 lb 8 oz (77.3 kg)  07/06/18 172 lb 6.4 oz (78.2 kg)     GENERAL:alert, no distress and comfortable SKIN: skin color, texture, turgor are normal, no rashes or significant lesions EYES: normal, Conjunctiva are pink and non-injected, sclera clear  NECK: supple, thyroid normal size, non-tender, without nodularity LYMPH:  no palpable lymphadenopathy in the cervical, axillary  LUNGS: clear to auscultation and percussion with normal breathing effort HEART: regular rate & rhythm and no murmurs and no lower extremity edema ABDOMEN:abdomen soft, non-tender and normal bowel sounds Musculoskeletal:no cyanosis of digits and no clubbing  NEURO: alert & oriented x 3 with fluent speech, no focal motor/sensory deficits BREAST: s/p lumpectomy bilaterally with mild scar tissue. (+) pea-sized nodule in  right breast at incision line which is likely scar tissue. No palpable mass or adenopathy bilaterally. Breast exam benign.   LABORATORY DATA:  I have reviewed the data as listed CBC Latest Ref Rng & Units 07/23/2021 07/01/2020  07/21/2019  WBC 4.0 - 10.5 K/uL 5.1 4.9 6.8  Hemoglobin 12.0 - 15.0 g/dL 15.7(H) 14.5 13.4  Hematocrit 36.0 - 46.0 % 46.4(H) 41.8 39.7  Platelets 150 - 400 K/uL 148(L) 150 150     CMP Latest Ref Rng & Units 07/23/2021 07/01/2020 07/21/2019  Glucose 70 - 99 mg/dL 146(H) 176(H) 133(H)  BUN 6 - 20 mg/dL '17 16 14  ' Creatinine 0.44 - 1.00 mg/dL 0.83 0.89 0.94  Sodium 135 - 145 mmol/L 140 141 143  Potassium 3.5 - 5.1 mmol/L 4.4 4.4 3.9  Chloride 98 - 111 mmol/L 109 107 107  CO2 22 - 32 mmol/L '22 26 27  ' Calcium 8.9 - 10.3 mg/dL 9.8 10.2 9.5  Total Protein 6.5 - 8.1 g/dL 7.2 7.2 6.8  Total Bilirubin 0.3 - 1.2 mg/dL 0.8 0.9 0.5  Alkaline Phos 38 - 126 U/L 132(H)  118 121  AST 15 - 41 U/L '31 31 29  ' ALT 0 - 44 U/L 71(H) 53(H) 46(H)      RADIOGRAPHIC STUDIES: I have personally reviewed the radiological images as listed and agreed with the findings in the report. No results found.    No orders of the defined types were placed in this encounter.  All questions were answered. The patient knows to call the clinic with any problems, questions or concerns. No barriers to learning was detected. The total time spent in the appointment was 25 minutes.     Truitt Merle, MD 07/23/2021   I, Wilburn Mylar, am acting as scribe for Truitt Merle, MD.   I have reviewed the above documentation for accuracy and completeness, and I agree with the above.

## 2021-07-27 ENCOUNTER — Other Ambulatory Visit: Payer: Self-pay | Admitting: Nurse Practitioner

## 2021-07-27 MED ORDER — VITAMIN D (ERGOCALCIFEROL) 1.25 MG (50000 UNIT) PO CAPS: 50000.0000 [IU] | ORAL_CAPSULE | ORAL | 3 refills | Status: DC

## 2021-08-28 DIAGNOSIS — Z0000 Encounter for general adult medical examination without abnormal findings: Secondary | ICD-10-CM | POA: Diagnosis not present

## 2021-08-28 DIAGNOSIS — Z1389 Encounter for screening for other disorder: Secondary | ICD-10-CM | POA: Diagnosis not present

## 2021-08-28 DIAGNOSIS — E1169 Type 2 diabetes mellitus with other specified complication: Secondary | ICD-10-CM | POA: Diagnosis not present

## 2021-08-28 DIAGNOSIS — G620 Drug-induced polyneuropathy: Secondary | ICD-10-CM | POA: Diagnosis not present

## 2021-08-28 DIAGNOSIS — F411 Generalized anxiety disorder: Secondary | ICD-10-CM | POA: Diagnosis not present

## 2021-08-28 DIAGNOSIS — Z23 Encounter for immunization: Secondary | ICD-10-CM | POA: Diagnosis not present

## 2021-08-28 DIAGNOSIS — E782 Mixed hyperlipidemia: Secondary | ICD-10-CM | POA: Diagnosis not present

## 2021-08-28 DIAGNOSIS — H6120 Impacted cerumen, unspecified ear: Secondary | ICD-10-CM | POA: Diagnosis not present

## 2021-08-28 DIAGNOSIS — Z853 Personal history of malignant neoplasm of breast: Secondary | ICD-10-CM | POA: Diagnosis not present

## 2021-08-28 DIAGNOSIS — M25511 Pain in right shoulder: Secondary | ICD-10-CM | POA: Diagnosis not present

## 2021-08-28 DIAGNOSIS — I493 Ventricular premature depolarization: Secondary | ICD-10-CM | POA: Diagnosis not present

## 2021-09-08 DIAGNOSIS — Z7984 Long term (current) use of oral hypoglycemic drugs: Secondary | ICD-10-CM | POA: Diagnosis not present

## 2021-09-08 DIAGNOSIS — H6123 Impacted cerumen, bilateral: Secondary | ICD-10-CM | POA: Diagnosis not present

## 2021-09-08 DIAGNOSIS — E119 Type 2 diabetes mellitus without complications: Secondary | ICD-10-CM | POA: Diagnosis not present

## 2021-11-11 ENCOUNTER — Other Ambulatory Visit: Payer: Self-pay | Admitting: Nurse Practitioner

## 2021-12-30 ENCOUNTER — Other Ambulatory Visit: Payer: Self-pay | Admitting: *Deleted

## 2021-12-30 ENCOUNTER — Other Ambulatory Visit: Payer: Self-pay | Admitting: Hematology

## 2021-12-30 DIAGNOSIS — C50212 Malignant neoplasm of upper-inner quadrant of left female breast: Secondary | ICD-10-CM

## 2022-01-14 ENCOUNTER — Other Ambulatory Visit: Payer: Self-pay | Admitting: Nurse Practitioner

## 2022-01-14 NOTE — Telephone Encounter (Signed)
See MyChart message from Carlene Coria, LPN regarding Vitamin D2 prescription.  Cira Rue, NP recommends pt start taking OTC Vitamin D and follow-up with her PCP regarding this prescription. ?

## 2022-02-18 ENCOUNTER — Other Ambulatory Visit: Payer: Self-pay | Admitting: Nurse Practitioner

## 2022-02-25 ENCOUNTER — Telehealth: Payer: Self-pay

## 2022-02-25 NOTE — Telephone Encounter (Signed)
Called pt regarding a request for high dose of vitamin D. Per Cira Rue NP, to follow up with your PCP regarding that medication since she is only seen once a year with Cira Rue NP. Also if she wanted, she could take 3000IU otc. Per pt's phone interpreter, pt signed understanding. She knows she can call the office with any other questions or concerns.   ?

## 2022-03-24 DIAGNOSIS — F411 Generalized anxiety disorder: Secondary | ICD-10-CM | POA: Diagnosis not present

## 2022-03-24 DIAGNOSIS — Z23 Encounter for immunization: Secondary | ICD-10-CM | POA: Diagnosis not present

## 2022-03-24 DIAGNOSIS — E782 Mixed hyperlipidemia: Secondary | ICD-10-CM | POA: Diagnosis not present

## 2022-03-24 DIAGNOSIS — E1169 Type 2 diabetes mellitus with other specified complication: Secondary | ICD-10-CM | POA: Diagnosis not present

## 2022-03-24 DIAGNOSIS — G620 Drug-induced polyneuropathy: Secondary | ICD-10-CM | POA: Diagnosis not present

## 2022-03-24 DIAGNOSIS — Z1389 Encounter for screening for other disorder: Secondary | ICD-10-CM | POA: Diagnosis not present

## 2022-03-24 DIAGNOSIS — Z853 Personal history of malignant neoplasm of breast: Secondary | ICD-10-CM | POA: Diagnosis not present

## 2022-03-24 DIAGNOSIS — I493 Ventricular premature depolarization: Secondary | ICD-10-CM | POA: Diagnosis not present

## 2022-06-02 ENCOUNTER — Other Ambulatory Visit: Payer: Self-pay | Admitting: Internal Medicine

## 2022-06-02 ENCOUNTER — Ambulatory Visit
Admission: RE | Admit: 2022-06-02 | Discharge: 2022-06-02 | Disposition: A | Discharge status: Home / Self Care | Payer: BC Managed Care – PPO | Source: Ambulatory Visit | Attending: Internal Medicine | Admitting: Internal Medicine

## 2022-06-02 DIAGNOSIS — M545 Low back pain, unspecified: Secondary | ICD-10-CM | POA: Diagnosis not present

## 2022-06-02 DIAGNOSIS — M5489 Other dorsalgia: Secondary | ICD-10-CM

## 2022-06-02 DIAGNOSIS — M549 Dorsalgia, unspecified: Secondary | ICD-10-CM | POA: Diagnosis not present

## 2022-06-02 DIAGNOSIS — M79605 Pain in left leg: Secondary | ICD-10-CM | POA: Diagnosis not present

## 2022-06-22 DIAGNOSIS — M5416 Radiculopathy, lumbar region: Secondary | ICD-10-CM | POA: Diagnosis not present

## 2022-07-07 ENCOUNTER — Other Ambulatory Visit: Payer: Self-pay | Admitting: Physical Medicine and Rehabilitation

## 2022-07-07 DIAGNOSIS — M5416 Radiculopathy, lumbar region: Secondary | ICD-10-CM

## 2022-07-23 ENCOUNTER — Encounter: Payer: Self-pay | Admitting: Hematology

## 2022-07-23 ENCOUNTER — Inpatient Hospital Stay: Payer: BC Managed Care – PPO

## 2022-07-23 ENCOUNTER — Inpatient Hospital Stay: Payer: BC Managed Care – PPO | Attending: Hematology | Admitting: Hematology

## 2022-07-23 ENCOUNTER — Other Ambulatory Visit: Payer: Self-pay

## 2022-07-23 VITALS — BP 99/63 | HR 79 | Temp 98.3°F | Resp 17 | Ht 67.0 in | Wt 158.1 lb

## 2022-07-23 DIAGNOSIS — C50212 Malignant neoplasm of upper-inner quadrant of left female breast: Secondary | ICD-10-CM

## 2022-07-23 DIAGNOSIS — Z9221 Personal history of antineoplastic chemotherapy: Secondary | ICD-10-CM | POA: Diagnosis not present

## 2022-07-23 DIAGNOSIS — E559 Vitamin D deficiency, unspecified: Secondary | ICD-10-CM | POA: Diagnosis not present

## 2022-07-23 DIAGNOSIS — Z923 Personal history of irradiation: Secondary | ICD-10-CM | POA: Insufficient documentation

## 2022-07-23 DIAGNOSIS — Z853 Personal history of malignant neoplasm of breast: Secondary | ICD-10-CM | POA: Insufficient documentation

## 2022-07-23 DIAGNOSIS — Z171 Estrogen receptor negative status [ER-]: Secondary | ICD-10-CM | POA: Diagnosis not present

## 2022-07-23 LAB — CBC WITH DIFFERENTIAL (CANCER CENTER ONLY)
Abs Immature Granulocytes: 0.02 10*3/uL (ref 0.00–0.07)
Basophils Absolute: 0.1 10*3/uL (ref 0.0–0.1)
Basophils Relative: 1 %
Eosinophils Absolute: 0.1 10*3/uL (ref 0.0–0.5)
Eosinophils Relative: 3 %
HCT: 44.4 % (ref 36.0–46.0)
Hemoglobin: 15.1 g/dL — ABNORMAL HIGH (ref 12.0–15.0)
Immature Granulocytes: 1 %
Lymphocytes Relative: 33 %
Lymphs Abs: 1.5 10*3/uL (ref 0.7–4.0)
MCH: 29.5 pg (ref 26.0–34.0)
MCHC: 34 g/dL (ref 30.0–36.0)
MCV: 86.9 fL (ref 80.0–100.0)
Monocytes Absolute: 0.3 10*3/uL (ref 0.1–1.0)
Monocytes Relative: 7 %
Neutro Abs: 2.5 10*3/uL (ref 1.7–7.7)
Neutrophils Relative %: 55 %
Platelet Count: 129 10*3/uL — ABNORMAL LOW (ref 150–400)
RBC: 5.11 MIL/uL (ref 3.87–5.11)
RDW: 15.1 % (ref 11.5–15.5)
WBC Count: 4.4 10*3/uL (ref 4.0–10.5)
nRBC: 0 % (ref 0.0–0.2)

## 2022-07-23 LAB — CMP (CANCER CENTER ONLY)
ALT: 45 U/L — ABNORMAL HIGH (ref 0–44)
AST: 34 U/L (ref 15–41)
Albumin: 4.5 g/dL (ref 3.5–5.0)
Alkaline Phosphatase: 94 U/L (ref 38–126)
Anion gap: 5 (ref 5–15)
BUN: 15 mg/dL (ref 6–20)
CO2: 26 mmol/L (ref 22–32)
Calcium: 9.7 mg/dL (ref 8.9–10.3)
Chloride: 107 mmol/L (ref 98–111)
Creatinine: 0.8 mg/dL (ref 0.44–1.00)
GFR, Estimated: >60 mL/min (ref >60)
Glucose, Bld: 136 mg/dL — ABNORMAL HIGH (ref 70–99)
Potassium: 4.2 mmol/L (ref 3.5–5.1)
Sodium: 138 mmol/L (ref 135–145)
Total Bilirubin: 0.8 mg/dL (ref 0.3–1.2)
Total Protein: 6.9 g/dL (ref 6.5–8.1)

## 2022-07-23 LAB — VITAMIN D 25 HYDROXY (VIT D DEFICIENCY, FRACTURES): Vit D, 25-Hydroxy: 89.65 ng/mL (ref 30–100)

## 2022-07-23 NOTE — Progress Notes (Signed)
[place]   Telephone:(336) [phone] Fax:(336) [phone]   Clinic Follow up Note   Patient Care Team: Wenda Low, MD as PCP - General (Internal Medicine) Gordy Levan, MD as Consulting Physician (Oncology) Tyler Pita, MD as Consulting Physician (Radiation Oncology) Alphonsa Overall, MD as Consulting Physician (General Surgery)  Date of Service:  07/23/2022  CHIEF COMPLAINT: f/u of left breast cancer  CURRENT THERAPY:  Surveillance  ASSESSMENT & PLAN:  Regina Watts is a 61 y.o. post-menopausal female with   1.Triple negative left breast cancer T1cN0 diagnosed 12-2013, history of stage II right breast cancer HR-/HER2+ in 2002 -h/o T2N0M0 right breast cancer 11/2000 at age 61 treated with right lumpectomy and chemotherapy -triple negative left breast cancer diagnosed in 2015, stage IA, treated with left lumpectomy 03/06/14. S/p Adjuvant chemotherapy completed in 09/10/14 and adjuvant RT completed 12/11/14 -most recent mammogram with left [place] on 07/22/21 was benign. -she is clinically doing well. Lab reviewed, hgb slightly elevated at 15.1 and plt decreased to 129k. Physical exam shows continued scar tissue to left breast, no concern.***  -continue surveillance.   2. Congenital deafness -Communicates by reading/writing and sign language.    3. Vitamin D Deficiency  -intermittently low  -results today are pending. I will call if results are abnormal.     PLAN: -continue cancer surveillance  -labs and f/u in 1 year ***   No problem-specific Assessment & Plan notes found for this encounter.   SUMMARY OF ONCOLOGIC HISTORY: Oncology History  Breast cancer of upper-inner quadrant of left female breast ([place])  12/2013 Initial Diagnosis   Triple negative malignant neoplasm of breast ([place])   01/01/2014 Pathology Results   Breast, left, needle core biopsy, mass, 10 o'clock - INVASIVE DUCTAL CARCINOMA.   01/30/2014 Genetic Testing   PMS2 c.1309C>T, ATM  c.8674G>A and ATM [phone] VUS's found on the Breast/Ovarian cancer panel.  The ATM [phone] VUS was identified as a mosaic result. The Breast/Ovarian gene panel offered by [facility] includes sequencing and rearrangement analysis for the following 21 genes:  ATM, BARD1, BRCA1, BRCA2, BRIP1, CDH1, CHEK2, EPCAM, FANCC, MLH1, MSH2, MSH6, NBN, PALB2, PMS2, PTEN, RAD51C, RAD51D, STK11, TP53, and XRCC2.     UPDATE: PMS2 c.1309C>T VUS has been reclassified from a variant of uncertain significance to a likely benign variant based on a combination of sources, e.g., internal data, published literature, population databases and in [place].  The updated report date is Apr 08, 2017.    03/06/2014 Surgery   S/p Lumpectomy by Dr. Lucia Gaskins at [facility].    03/06/2014 Pathology Results   1. Lymph node, sentinel, biopsy, Left axillary - ONE BENIGN LYMPH NODE WITH NO TUMOR SEEN (0/1). 2. Breast, lumpectomy, Left - INVASIVE GRADE II DUCTAL CARCINOMA SPANNING 1.8 CM IN GREATEST DIMENSION. - INVASIVE TUMOR IS EXTREMELY CLOSE (0.1 CM) TO POSTERIOR MARGIN ON INITIAL LUMPECTOMY SPECIMEN, PLEASE SEE SPECIMEN # 3 FOR FINAL MARGIN STATUS. - OTHER MARGINS ARE NEGATIVE. - SEE ONCOLOGY TEMPLATE. 3. Breast, excision, Left posterior margin - BENIGN BREAST PARENCHYMA. - NO TUMOR SEEN.   03/06/2014 Receptors her2   Estrogen receptor: negative. Progesterone receptor: negative. HER-2: negative   03/30/2014 - 04/14/2014 Chemotherapy   The patient had adjuvant chemotherapy treatment, Cytoxan and Adriamycin for 4 cycles then Taxol for 12 weeks    10/22/2014 - 12/11/2014 Radiation Therapy    The patient saw Dr. Tammi Klippel for radiation treatment. This is the current list of radiation treatment:  1.  The whole left breast  was treated to 50.4 Gy in 28 fractions of 1.8 Gy 2.  The lumpectomy surgical site was treated to a total dose of 60.4 Gy with 5 additional fractions of 2 Gy   09/07/2017 Surgery   LEFT BREAST  MASTOPEXY by Dr. Iran Planas   01/20/2018 Mammogram   IMPRESSION: No evidence of malignancy bilaterally. Bilateral lumpectomy changes   04/01/2020 Pathology Results   Breast, left, needle core biopsy, upper outer quadrant, 1 o'clock, 1cm from nipple - FAT NECROSIS WITH ASSOCIATED FIBROSIS. SEE NOTE - NEGATIVE FOR CARCINOMA      INTERVAL HISTORY:  Regina Watts is here for a follow up of breast cancer. She was last seen by me one year ago. She presents to the clinic {alone/accompanied by}.   All other systems were reviewed with the patient and are negative.  MEDICAL HISTORY:  Past Medical History:  Diagnosis Date   Breast cancer ([place]) JANUARY 2002   RIGHT BREAST T4 SENTINEL NODE NEGATIVE, HER2 3+, ER/PR NEGATIVE   Breast cancer ([place]) February 2015   Left Breast T1N0   Congenital deafness    Hx antineoplastic chemo    Hx of radiation therapy    Hyperlipidemia    Personal history of radiation therapy    Wears glasses    to drive    SURGICAL HISTORY: Past Surgical History:  Procedure Laterality Date   AUGMENTATION MAMMAPLASTY Left    breast lift    BREAST LUMPECTOMY Left 03/06/2014   BREAST LUMPECTOMY Right 11/2000   BREAST LUMPECTOMY WITH NEEDLE LOCALIZATION AND AXILLARY SENTINEL LYMPH NODE BX Left 03/06/2014   Procedure: BREAST LUMPECTOMY WITH NEEDLE LOCALIZATION AND AXILLARY SENTINEL LYMPH NODE BIOPSY;  Surgeon: Shann Medal, MD;  Location: [place];  Service: General;  Laterality: Left;   BREAST SURGERY  2002   lumpectomy on right side   CESAREAN SECTION     MASTOPEXY Left 09/07/2017   Procedure: LEFT BREAST MASTOPEXY;  Surgeon: Irene Limbo, MD;  Location: [place];  Service: Plastics;  Laterality: Left;   PORT-A-CATH REMOVAL Right 08/03/2014   Procedure: REMOVAL PORT-A-CATH;  Surgeon: Jackolyn Confer, MD;  [facility];  Service: General;  Laterality: Right;   PORTACATH PLACEMENT Right 03/06/2014   Procedure: INSERTION  PORT-A-CATH;  Surgeon: Shann Medal, MD;  Location: [place];  Service: General;  Laterality: Right;  subclavian   ROTATOR CUFF REPAIR Right 02/04/2021   TONSILLECTOMY      I have reviewed the social history and family history with the patient and they are unchanged from previous note.  ALLERGIES:  is allergic to taxotere [docetaxel].  MEDICATIONS:  Current Outpatient Medications  Medication Sig Dispense Refill   atorvastatin (LIPITOR) 40 MG tablet Take 40 mg by mouth daily.  3   empagliflozin (JARDIANCE) 25 MG TABS tablet Take 25 mg by mouth daily.     metFORMIN (GLUCOPHAGE) 500 MG tablet TAKE 1 TABLET BY MOUTH EVERY DAY WITH MEALS (Patient not taking: Reported on 02/17/2021)  6   pyridOXINE (VITAMIN B-6) 50 MG tablet Take 1 tablet (50 mg total) by mouth 2 (two) times daily. 60 tablet 5   sertraline (ZOLOFT) 100 MG tablet Take 1 tablet (100 mg total) by mouth daily. 30 tablet 3   Vitamin D, Ergocalciferol, (DRISDOL) 1.25 MG (50000 UNIT) CAPS capsule TAKE 1 CAPSULE (50,000 UNITS TOTAL) BY MOUTH EVERY 7 (SEVEN) DAYS 12 capsule 2   No current facility-administered medications for this visit.    PHYSICAL EXAMINATION: ECOG  PERFORMANCE STATUS: {CHL ONC ECOG GN:[id number]}  Vitals:   07/23/22 1434  BP: 99/63  Pulse: 79  Resp: 17  Temp: 98.3 F (36.8 C)  SpO2: 99%   Wt Readings from Last 3 Encounters:  07/23/22 158 lb 1.6 oz (71.7 kg)  07/23/21 163 lb 9.6 oz (74.2 kg)  07/01/20 170 lb 8 oz (77.3 kg)     GENERAL:alert, no distress and comfortable SKIN: skin color, texture, turgor are normal, no rashes or significant lesions EYES: normal, Conjunctiva are pink and non-injected, sclera clear {OROPHARYNX:no exudate, no erythema and lips, buccal mucosa, and tongue normal}  NECK: supple, thyroid normal size, non-tender, without nodularity LYMPH:  no palpable lymphadenopathy in the cervical, axillary LUNGS: clear to auscultation and percussion with normal breathing  effort HEART: regular rate & rhythm and no murmurs and no lower extremity edema ABDOMEN:abdomen soft, non-tender and normal bowel sounds Musculoskeletal:no cyanosis of digits and no clubbing  NEURO: alert & oriented x 3 with fluent speech, no focal motor/sensory deficits BREAST: *** No palpable mass, nodules or adenopathy bilaterally. Breast exam benign.   LABORATORY DATA:  I have reviewed the data as listed    Latest Ref Rng & Units 07/23/2022    1:59 PM 07/23/2021   12:59 PM 07/01/2020   11:23 AM  CBC  WBC 4.0 - 10.5 K/uL 4.4  5.1  4.9   Hemoglobin 12.0 - 15.0 g/dL 15.1  15.7  14.5   Hematocrit 36.0 - 46.0 % 44.4  46.4  41.8   Platelets 150 - 400 K/uL 129  148  150         Latest Ref Rng & Units 07/23/2021   12:59 PM 07/01/2020   11:23 AM 07/21/2019    3:10 PM  CMP  Glucose 70 - 99 mg/dL 146  176  133   BUN 6 - 20 mg/dL _0 Creatinine 0.44 - 1.00 mg/dL 0.83  0.89  0.94   Sodium 135 - 145 mmol/L 140  141  143   Potassium 3.5 - 5.1 mmol/L 4.4  4.4  3.9   Chloride 98 - 111 mmol/L 109  107  107   CO2 22 - 32 mmol/L _1 Calcium 8.9 - 10.3 mg/dL 9.8  10.2  9.5   Total Protein 6.5 - 8.1 g/dL 7.2  7.2  6.8   Total Bilirubin 0.3 - 1.2 mg/dL 0.8  0.9  0.5   Alkaline Phos 38 - 126 U/L 132  118  121   AST 15 - 41 U/L _2 ALT 0 - 44 U/L 71  53  46       RADIOGRAPHIC STUDIES: I have personally reviewed the radiological images as listed and agreed with the findings in the report. No results found.    No orders of the defined types were placed in this encounter.  All questions were answered. The patient knows to call the clinic with any problems, questions or concerns. No barriers to learning was detected. The total time spent in the appointment was {CHL ONC TIME VISIT - [id number]}.     Aurea Graff 07/23/2022   I, Wilburn Mylar, am acting as scribe for Truitt Merle, MD.   {Add scribe attestation statement}

## 2022-07-26 ENCOUNTER — Encounter: Payer: Self-pay | Admitting: Hematology

## 2022-07-27 ENCOUNTER — Ambulatory Visit
Admission: RE | Admit: 2022-07-27 | Discharge: 2022-07-27 | Disposition: A | Discharge status: Home / Self Care | Payer: BC Managed Care – PPO | Source: Ambulatory Visit | Attending: Physical Medicine and Rehabilitation | Admitting: Physical Medicine and Rehabilitation

## 2022-07-27 DIAGNOSIS — M5416 Radiculopathy, lumbar region: Secondary | ICD-10-CM

## 2022-07-27 DIAGNOSIS — M545 Low back pain, unspecified: Secondary | ICD-10-CM | POA: Diagnosis not present

## 2022-08-10 DIAGNOSIS — M5126 Other intervertebral disc displacement, lumbar region: Secondary | ICD-10-CM | POA: Diagnosis not present

## 2022-08-10 DIAGNOSIS — M5416 Radiculopathy, lumbar region: Secondary | ICD-10-CM | POA: Diagnosis not present

## 2022-08-17 DIAGNOSIS — M62830 Muscle spasm of back: Secondary | ICD-10-CM | POA: Diagnosis not present

## 2022-08-17 DIAGNOSIS — M6281 Muscle weakness (generalized): Secondary | ICD-10-CM | POA: Diagnosis not present

## 2022-08-17 DIAGNOSIS — M5416 Radiculopathy, lumbar region: Secondary | ICD-10-CM | POA: Diagnosis not present

## 2022-08-21 DIAGNOSIS — M6281 Muscle weakness (generalized): Secondary | ICD-10-CM | POA: Diagnosis not present

## 2022-08-21 DIAGNOSIS — M5416 Radiculopathy, lumbar region: Secondary | ICD-10-CM | POA: Diagnosis not present

## 2022-08-21 DIAGNOSIS — M62830 Muscle spasm of back: Secondary | ICD-10-CM | POA: Diagnosis not present

## 2022-08-26 DIAGNOSIS — L821 Other seborrheic keratosis: Secondary | ICD-10-CM | POA: Diagnosis not present

## 2022-08-26 DIAGNOSIS — L820 Inflamed seborrheic keratosis: Secondary | ICD-10-CM | POA: Diagnosis not present

## 2022-08-26 DIAGNOSIS — M5416 Radiculopathy, lumbar region: Secondary | ICD-10-CM | POA: Diagnosis not present

## 2022-08-26 DIAGNOSIS — M6281 Muscle weakness (generalized): Secondary | ICD-10-CM | POA: Diagnosis not present

## 2022-08-26 DIAGNOSIS — M62830 Muscle spasm of back: Secondary | ICD-10-CM | POA: Diagnosis not present

## 2022-09-04 DIAGNOSIS — M6281 Muscle weakness (generalized): Secondary | ICD-10-CM | POA: Diagnosis not present

## 2022-09-04 DIAGNOSIS — M5416 Radiculopathy, lumbar region: Secondary | ICD-10-CM | POA: Diagnosis not present

## 2022-09-04 DIAGNOSIS — M62830 Muscle spasm of back: Secondary | ICD-10-CM | POA: Diagnosis not present

## 2022-09-14 DIAGNOSIS — G620 Drug-induced polyneuropathy: Secondary | ICD-10-CM | POA: Diagnosis not present

## 2022-09-14 DIAGNOSIS — I493 Ventricular premature depolarization: Secondary | ICD-10-CM | POA: Diagnosis not present

## 2022-09-14 DIAGNOSIS — M5416 Radiculopathy, lumbar region: Secondary | ICD-10-CM | POA: Diagnosis not present

## 2022-09-14 DIAGNOSIS — Z853 Personal history of malignant neoplasm of breast: Secondary | ICD-10-CM | POA: Diagnosis not present

## 2022-09-14 DIAGNOSIS — E1169 Type 2 diabetes mellitus with other specified complication: Secondary | ICD-10-CM | POA: Diagnosis not present

## 2022-09-14 DIAGNOSIS — Z0000 Encounter for general adult medical examination without abnormal findings: Secondary | ICD-10-CM | POA: Diagnosis not present

## 2022-09-14 DIAGNOSIS — F411 Generalized anxiety disorder: Secondary | ICD-10-CM | POA: Diagnosis not present

## 2022-09-14 DIAGNOSIS — Z23 Encounter for immunization: Secondary | ICD-10-CM | POA: Diagnosis not present

## 2022-09-14 DIAGNOSIS — H6123 Impacted cerumen, bilateral: Secondary | ICD-10-CM | POA: Diagnosis not present

## 2022-09-18 DIAGNOSIS — M6281 Muscle weakness (generalized): Secondary | ICD-10-CM | POA: Diagnosis not present

## 2022-09-18 DIAGNOSIS — M62830 Muscle spasm of back: Secondary | ICD-10-CM | POA: Diagnosis not present

## 2022-09-18 DIAGNOSIS — M5416 Radiculopathy, lumbar region: Secondary | ICD-10-CM | POA: Diagnosis not present

## 2022-09-22 DIAGNOSIS — M6281 Muscle weakness (generalized): Secondary | ICD-10-CM | POA: Diagnosis not present

## 2022-09-22 DIAGNOSIS — M62830 Muscle spasm of back: Secondary | ICD-10-CM | POA: Diagnosis not present

## 2022-09-22 DIAGNOSIS — M5416 Radiculopathy, lumbar region: Secondary | ICD-10-CM | POA: Diagnosis not present

## 2022-10-21 DIAGNOSIS — M5126 Other intervertebral disc displacement, lumbar region: Secondary | ICD-10-CM | POA: Diagnosis not present

## 2022-10-22 DIAGNOSIS — M5416 Radiculopathy, lumbar region: Secondary | ICD-10-CM | POA: Diagnosis not present

## 2022-10-22 DIAGNOSIS — M6281 Muscle weakness (generalized): Secondary | ICD-10-CM | POA: Diagnosis not present

## 2022-10-22 DIAGNOSIS — M62830 Muscle spasm of back: Secondary | ICD-10-CM | POA: Diagnosis not present

## 2022-10-27 DIAGNOSIS — M6281 Muscle weakness (generalized): Secondary | ICD-10-CM | POA: Diagnosis not present

## 2022-10-27 DIAGNOSIS — M5416 Radiculopathy, lumbar region: Secondary | ICD-10-CM | POA: Diagnosis not present

## 2022-10-27 DIAGNOSIS — M62830 Muscle spasm of back: Secondary | ICD-10-CM | POA: Diagnosis not present

## 2022-11-03 DIAGNOSIS — M62830 Muscle spasm of back: Secondary | ICD-10-CM | POA: Diagnosis not present

## 2022-11-03 DIAGNOSIS — M5416 Radiculopathy, lumbar region: Secondary | ICD-10-CM | POA: Diagnosis not present

## 2022-11-03 DIAGNOSIS — M6281 Muscle weakness (generalized): Secondary | ICD-10-CM | POA: Diagnosis not present

## 2022-12-02 DIAGNOSIS — M5416 Radiculopathy, lumbar region: Secondary | ICD-10-CM | POA: Diagnosis not present

## 2022-12-09 DIAGNOSIS — Z6825 Body mass index (BMI) 25.0-25.9, adult: Secondary | ICD-10-CM | POA: Diagnosis not present

## 2022-12-09 DIAGNOSIS — Z01419 Encounter for gynecological examination (general) (routine) without abnormal findings: Secondary | ICD-10-CM | POA: Diagnosis not present

## 2022-12-23 ENCOUNTER — Other Ambulatory Visit: Payer: Self-pay | Admitting: Nurse Practitioner

## 2022-12-24 ENCOUNTER — Telehealth: Payer: Self-pay

## 2022-12-24 NOTE — Telephone Encounter (Signed)
Called patient and relayed message below, patient had full understanding.    Good morning, Regina Watts. her last vitamin D level was very good, so I'm declining the refill for high dose vit D. please call her and let her take 3000 IU daily. this dose is over the counter. thanks Cira Rue NP

## 2023-01-05 DIAGNOSIS — N6310 Unspecified lump in the right breast, unspecified quadrant: Secondary | ICD-10-CM | POA: Diagnosis not present

## 2023-01-06 DIAGNOSIS — M5126 Other intervertebral disc displacement, lumbar region: Secondary | ICD-10-CM | POA: Diagnosis not present

## 2023-03-23 DIAGNOSIS — E1169 Type 2 diabetes mellitus with other specified complication: Secondary | ICD-10-CM | POA: Diagnosis not present

## 2023-03-23 DIAGNOSIS — E782 Mixed hyperlipidemia: Secondary | ICD-10-CM | POA: Diagnosis not present

## 2023-03-23 DIAGNOSIS — Z853 Personal history of malignant neoplasm of breast: Secondary | ICD-10-CM | POA: Diagnosis not present

## 2023-03-23 DIAGNOSIS — F5104 Psychophysiologic insomnia: Secondary | ICD-10-CM | POA: Diagnosis not present

## 2023-03-23 DIAGNOSIS — F411 Generalized anxiety disorder: Secondary | ICD-10-CM | POA: Diagnosis not present

## 2023-07-21 ENCOUNTER — Other Ambulatory Visit: Payer: Self-pay

## 2023-07-21 DIAGNOSIS — C50212 Malignant neoplasm of upper-inner quadrant of left female breast: Secondary | ICD-10-CM

## 2023-07-22 ENCOUNTER — Inpatient Hospital Stay: Payer: BC Managed Care – PPO | Attending: Nurse Practitioner

## 2023-07-22 ENCOUNTER — Inpatient Hospital Stay: Payer: BC Managed Care – PPO | Admitting: Nurse Practitioner

## 2023-07-22 NOTE — Progress Notes (Deleted)
Patient Care Team: Georgann Housekeeper, MD as PCP - General (Internal Medicine) Reece Packer, MD as Consulting Physician (Oncology) Margaretmary Dys, MD as Consulting Physician (Radiation Oncology) Ovidio Kin, MD as Consulting Physician (General Surgery)   CHIEF COMPLAINT: Follow-up left breast cancer  Oncology History  Breast cancer of upper-inner quadrant of left female breast (HCC)  12/2013 Initial Diagnosis   Triple negative malignant neoplasm of breast (HCC)   01/01/2014 Pathology Results   Breast, left, needle core biopsy, mass, 10 o'clock - INVASIVE DUCTAL CARCINOMA.   01/30/2014 Genetic Testing   PMS2 c.1309C>T, ATM c.8674G>A and ATM [phone] VUS's found on the Breast/Ovarian cancer panel.  The ATM [phone] VUS was identified as a mosaic result. The Breast/Ovarian gene panel offered by [facility] includes sequencing and rearrangement analysis for the following 21 genes:  ATM, BARD1, BRCA1, BRCA2, BRIP1, CDH1, CHEK2, EPCAM, FANCC, MLH1, MSH2, MSH6, NBN, PALB2, PMS2, PTEN, RAD51C, RAD51D, STK11, TP53, and XRCC2.     UPDATE: PMS2 c.1309C>T VUS has been reclassified from a variant of uncertain significance to a likely benign variant based on a combination of sources, e.g., internal data, published literature, population databases and in silico models.  The updated report date is Apr 08, 2017.    03/06/2014 Surgery   S/p Lumpectomy by Dr. Ezzard Standing at [facility].    03/06/2014 Pathology Results   1. Lymph node, sentinel, biopsy, Left axillary - ONE BENIGN LYMPH NODE WITH NO TUMOR SEEN (0/1). 2. Breast, lumpectomy, Left - INVASIVE GRADE II DUCTAL CARCINOMA SPANNING 1.8 CM IN GREATEST DIMENSION. - INVASIVE TUMOR IS EXTREMELY CLOSE (0.1 CM) TO POSTERIOR MARGIN ON INITIAL LUMPECTOMY SPECIMEN, PLEASE SEE SPECIMEN # 3 FOR FINAL MARGIN STATUS. - OTHER MARGINS ARE NEGATIVE. - SEE ONCOLOGY TEMPLATE. 3. Breast, excision, Left posterior margin - BENIGN BREAST PARENCHYMA. -  NO TUMOR SEEN.   03/06/2014 Receptors her2   Estrogen receptor: negative. Progesterone receptor: negative. HER-2: negative   03/30/2014 - 04/14/2014 Chemotherapy   The patient had adjuvant chemotherapy treatment, Cytoxan and Adriamycin for 4 cycles then Taxol for 12 weeks    10/22/2014 - 12/11/2014 Radiation Therapy    The patient saw Dr. Kathrynn Running for radiation treatment. This is the current list of radiation treatment:  1.  The whole left breast was treated to 50.4 Gy in 28 fractions of 1.8 Gy 2.  The lumpectomy surgical site was treated to a total dose of 60.4 Gy with 5 additional fractions of 2 Gy   09/07/2017 Surgery   LEFT BREAST MASTOPEXY by Dr. Leta Baptist   01/20/2018 Mammogram   IMPRESSION: No evidence of malignancy bilaterally. Bilateral lumpectomy changes   04/01/2020 Pathology Results   Breast, left, needle core biopsy, upper outer quadrant, 1 o'clock, 1cm from nipple - FAT NECROSIS WITH ASSOCIATED FIBROSIS. SEE NOTE - NEGATIVE FOR CARCINOMA      CURRENT THERAPY: Surveillance  INTERVAL HISTORY Ms. Noralyn Pick returns for follow-up as scheduled, last seen by Dr. Mosetta Putt 07/23/2022.  ROS   Past Medical History:  Diagnosis Date   Breast cancer (HCC) JANUARY 2002   RIGHT BREAST T4 SENTINEL NODE NEGATIVE, HER2 3+, ER/PR NEGATIVE   Breast cancer [facility]) February 2015   Left Breast T1N0   Congenital deafness    Hx antineoplastic chemo    Hx of radiation therapy    Hyperlipidemia    Personal history of radiation therapy    Wears glasses    to drive     Past Surgical History:  Procedure Laterality Date   AUGMENTATION  MAMMAPLASTY Left    breast lift    BREAST LUMPECTOMY Left 03/06/2014   BREAST LUMPECTOMY Right 11/2000   BREAST LUMPECTOMY WITH NEEDLE LOCALIZATION AND AXILLARY SENTINEL LYMPH NODE BX Left 03/06/2014   Procedure: BREAST LUMPECTOMY WITH NEEDLE LOCALIZATION AND AXILLARY SENTINEL LYMPH NODE BIOPSY;  Surgeon: Kandis Cocking, MD;  Location: [facility];   Service: General;  Laterality: Left;   BREAST SURGERY  2002   lumpectomy on right side   CESAREAN SECTION     MASTOPEXY Left 09/07/2017   Procedure: LEFT BREAST MASTOPEXY;  Surgeon: Glenna Fellows, MD;  Location: [facility];  Service: Plastics;  Laterality: Left;   PORT-A-CATH REMOVAL Right 08/03/2014   Procedure: REMOVAL PORT-A-CATH;  Surgeon: Avel Peace, MD;  Location: [facility];  Service: General;  Laterality: Right;   PORTACATH PLACEMENT Right 03/06/2014   Procedure: INSERTION PORT-A-CATH;  Surgeon: Kandis Cocking, MD;  Location: [facility];  Service: General;  Laterality: Right;  subclavian   ROTATOR CUFF REPAIR Right 02/04/2021   TONSILLECTOMY       Outpatient Encounter Medications as of 07/22/2023  Medication Sig Note   atorvastatin (LIPITOR) 40 MG tablet Take 40 mg by mouth daily. 08/31/2016: Received from: External Pharmacy Received Sig: TAKE 1 TABLET EVERY DAY   empagliflozin (JARDIANCE) 25 MG TABS tablet Take 25 mg by mouth daily.    sertraline (ZOLOFT) 100 MG tablet Take 1 tablet (100 mg total) by mouth daily. 08/01/2014: -   Vitamin D, Ergocalciferol, (DRISDOL) 1.25 MG (50000 UNIT) CAPS capsule TAKE 1 CAPSULE (50,000 UNITS TOTAL) BY MOUTH EVERY 7 (SEVEN) DAYS    No facility-administered encounter medications on file as of 07/22/2023.     There were no vitals filed for this visit. There is no height or weight on file to calculate BMI.   PHYSICAL EXAM GENERAL:alert, no distress and comfortable SKIN: no rash  EYES: sclera clear NECK: without mass LYMPH:  no palpable cervical or supraclavicular lymphadenopathy  LUNGS: clear with normal breathing effort HEART: regular rate & rhythm, no lower extremity edema ABDOMEN: abdomen soft, non-tender and normal bowel sounds NEURO: alert & oriented x 3 with fluent speech, no focal motor/sensory deficits Breast exam:  PAC without erythema    CBC    Component Value Date/Time   WBC 4.4 07/23/2022  1359   WBC 5.1 07/23/2021 1259   RBC 5.11 07/23/2022 1359   HGB 15.1 (H) 07/23/2022 1359   HGB 14.2 07/07/2017 1115   HCT 44.4 07/23/2022 1359   HCT 41.9 07/07/2017 1115   PLT 129 (L) 07/23/2022 1359   PLT 152 07/07/2017 1115   MCV 86.9 07/23/2022 1359   MCV 93.5 07/07/2017 1115   MCH 29.5 07/23/2022 1359   MCHC 34.0 07/23/2022 1359   RDW 15.1 07/23/2022 1359   RDW 13.4 07/07/2017 1115   LYMPHSABS 1.5 07/23/2022 1359   LYMPHSABS 0.8 (L) 07/07/2017 1115   MONOABS 0.3 07/23/2022 1359   MONOABS 0.3 07/07/2017 1115   EOSABS 0.1 07/23/2022 1359   EOSABS 0.1 07/07/2017 1115   BASOSABS 0.1 07/23/2022 1359   BASOSABS 0.0 07/07/2017 1115     CMP     Component Value Date/Time   NA 138 07/23/2022 1359   NA 142 07/07/2017 1115   K 4.2 07/23/2022 1359   K 3.8 07/07/2017 1115   CL 107 07/23/2022 1359   CO2 26 07/23/2022 1359   CO2 29 07/07/2017 1115   GLUCOSE 136 (H) 07/23/2022 1359   GLUCOSE  159 (H) 07/07/2017 1115   BUN 15 07/23/2022 1359   BUN 15.8 07/07/2017 1115   CREATININE 0.80 07/23/2022 1359   CREATININE 0.9 07/07/2017 1115   CALCIUM 9.7 07/23/2022 1359   CALCIUM 10.1 07/07/2017 1115   PROT 6.9 07/23/2022 1359   PROT 7.2 07/07/2017 1115   ALBUMIN 4.5 07/23/2022 1359   ALBUMIN 3.9 07/07/2017 1115   AST 34 07/23/2022 1359   AST 52 (H) 07/07/2017 1115   ALT 45 (H) 07/23/2022 1359   ALT 107 (H) 07/07/2017 1115   ALKPHOS 94 07/23/2022 1359   ALKPHOS 102 07/07/2017 1115   BILITOT 0.8 07/23/2022 1359   BILITOT 0.86 07/07/2017 1115   GFRNONAA >60 07/23/2022 1359   GFRAA >60 07/01/2020 1123     ASSESSMENT & PLAN: Regina Watts is a 61 y.o. female with    1.Triple negative left breast cancer T1cN0 diagnosed 12-2013, history of stage II right breast cancer HR-/HER2+ in 2002 -She has history of T2N0M0 right breast cancer 11-2000 at age 66 treated with right lumpectomy, 2 sentinel nodes and chemotherapy -She had a history of triple negative breast cancer in 2015, stage IA,  treated with left lumpectomy and sentinel node evaluation 03/06/14. S/p Adjuvant chemotherapy completed in 09/10/2014 and adjuvant RT completed 12/11/14 -Currently on surveillance   2. Congenital deafness -Communicates by reading/writing and sign language.    3. Genetics  -multiple genetic variants of unknown significance on genetic testing 01-2014    4. Peripheral neuropathy  -related to prior taxanes -resolved in feet, mild and intermittent in fingertips. Functions well. -declined medication -monitoring    5. Hepatic steatosis and mild transaminitis -Her prior CT abdomen and liver MRI in 2015 showed hepatic stenosis and a 1.2cm triangular subcapsular medial segment of left hepatic lobe area of enhancement, which is unchanged since 2008, likely represent perfusion phenomena.  -She has some mild intermittent transaminitis since 2015, likely segments to hepatic steatosis, overall stable -stable, continue monitoring    6. DM -on metformin, f/u PCP    7. Vitamin D Deficiency  -intermittently low -level was 21.8 on 07/21/19, level is pending today   PLAN:  No orders of the defined types were placed in this encounter.     All questions were answered. The patient knows to call the clinic with any problems, questions or concerns. No barriers to learning were detected. I spent *** counseling the patient face to face. The total time spent in the appointment was *** and more than 50% was on counseling, review of test results, and coordination of care.   Santiago Glad, NP-C @DATE @

## 2023-07-27 ENCOUNTER — Telehealth: Payer: Self-pay | Admitting: Nurse Practitioner

## 2023-07-27 NOTE — Telephone Encounter (Signed)
Patient called in to schedule an appointment for 09/24, appointment has been scheduled.

## 2023-08-02 ENCOUNTER — Telehealth: Payer: Self-pay | Admitting: Nurse Practitioner

## 2023-08-02 ENCOUNTER — Other Ambulatory Visit: Payer: Self-pay

## 2023-08-02 DIAGNOSIS — Z1231 Encounter for screening mammogram for malignant neoplasm of breast: Secondary | ICD-10-CM | POA: Diagnosis not present

## 2023-08-03 ENCOUNTER — Inpatient Hospital Stay: Payer: BC Managed Care – PPO

## 2023-08-03 ENCOUNTER — Telehealth: Payer: Self-pay | Admitting: Nurse Practitioner

## 2023-08-03 ENCOUNTER — Inpatient Hospital Stay: Payer: BC Managed Care – PPO | Admitting: Nurse Practitioner

## 2023-08-10 ENCOUNTER — Ambulatory Visit: Payer: BC Managed Care – PPO | Admitting: Nurse Practitioner

## 2023-08-10 ENCOUNTER — Other Ambulatory Visit: Payer: BC Managed Care – PPO

## 2023-09-01 NOTE — Progress Notes (Unsigned)
Patient Care Team: Georgann Housekeeper, MD as PCP - General (Internal Medicine) Reece Packer, MD as Consulting Physician (Oncology) Margaretmary Dys, MD as Consulting Physician (Radiation Oncology) Ovidio Kin, MD as Consulting Physician (General Surgery)   CHIEF COMPLAINT: Follow up left breast cancer   Oncology History  Breast cancer of upper-inner quadrant of left female breast (HCC)  12/2013 Initial Diagnosis   Triple negative malignant neoplasm of breast (HCC)   01/01/2014 Pathology Results   Breast, left, needle core biopsy, mass, 10 o'clock - INVASIVE DUCTAL CARCINOMA.   01/30/2014 Genetic Testing   PMS2 c.1309C>T, ATM c.8674G>A and ATM [phone] VUS's found on the Breast/Ovarian cancer panel.  The ATM [phone] VUS was identified as a mosaic result. The Breast/Ovarian gene panel offered by [facility] includes sequencing and rearrangement analysis for the following 21 genes:  ATM, BARD1, BRCA1, BRCA2, BRIP1, CDH1, CHEK2, EPCAM, FANCC, MLH1, MSH2, MSH6, NBN, PALB2, PMS2, PTEN, RAD51C, RAD51D, STK11, TP53, and XRCC2.     UPDATE: PMS2 c.1309C>T VUS has been reclassified from a variant of uncertain significance to a likely benign variant based on a combination of sources, e.g., internal data, published literature, population databases and in silico models.  The updated report date is Apr 08, 2017.    03/06/2014 Surgery   S/p Lumpectomy by Dr. Ezzard Standing at [facility].    03/06/2014 Pathology Results   1. Lymph node, sentinel, biopsy, Left axillary - ONE BENIGN LYMPH NODE WITH NO TUMOR SEEN (0/1). 2. Breast, lumpectomy, Left - INVASIVE GRADE II DUCTAL CARCINOMA SPANNING 1.8 CM IN GREATEST DIMENSION. - INVASIVE TUMOR IS EXTREMELY CLOSE (0.1 CM) TO POSTERIOR MARGIN ON INITIAL LUMPECTOMY SPECIMEN, PLEASE SEE SPECIMEN # 3 FOR FINAL MARGIN STATUS. - OTHER MARGINS ARE NEGATIVE. - SEE ONCOLOGY TEMPLATE. 3. Breast, excision, Left posterior margin - BENIGN BREAST PARENCHYMA. -  NO TUMOR SEEN.   03/06/2014 Receptors her2   Estrogen receptor: negative. Progesterone receptor: negative. HER-2: negative   03/30/2014 - 04/14/2014 Chemotherapy   The patient had adjuvant chemotherapy treatment, Cytoxan and Adriamycin for 4 cycles then Taxol for 12 weeks    10/22/2014 - 12/11/2014 Radiation Therapy    The patient saw Dr. Kathrynn Running for radiation treatment. This is the current list of radiation treatment:  1.  The whole left breast was treated to 50.4 Gy in 28 fractions of 1.8 Gy 2.  The lumpectomy surgical site was treated to a total dose of 60.4 Gy with 5 additional fractions of 2 Gy   09/07/2017 Surgery   LEFT BREAST MASTOPEXY by Dr. Leta Baptist   01/20/2018 Mammogram   IMPRESSION: No evidence of malignancy bilaterally. Bilateral lumpectomy changes   04/01/2020 Pathology Results   Breast, left, needle core biopsy, upper outer quadrant, 1 o'clock, 1cm from nipple - FAT NECROSIS WITH ASSOCIATED FIBROSIS. SEE NOTE - NEGATIVE FOR CARCINOMA      CURRENT THERAPY: Surveillance   INTERVAL HISTORY Ms. Teutsch returns for follow up as scheduled. Last seen by Dr. Mosetta Putt 07/23/22.  She had a recent mammogram at [facility] that was clear.  Denies changes in her breasts such as new lump/mass, nipple discharge or inversion, or skin change.  She is doing well overall with no significant changes in her health, except dealing with some back pain from arthritis and history of heavy lifting.  She got an injection recently.  Up-to-date on age-appropriate cancer screenings.  ROS  All other systems reviewed and negative  Past Medical History:  Diagnosis Date   Breast cancer (HCC) JANUARY 2002  RIGHT BREAST T4 SENTINEL NODE NEGATIVE, HER2 3+, ER/PR NEGATIVE   Breast cancer [facility]) February 2015   Left Breast T1N0   Congenital deafness    Hx antineoplastic chemo    Hx of radiation therapy    Hyperlipidemia    Personal history of radiation therapy    Wears glasses    to drive     Past  Surgical History:  Procedure Laterality Date   AUGMENTATION MAMMAPLASTY Left    breast lift    BREAST LUMPECTOMY Left 03/06/2014   BREAST LUMPECTOMY Right 11/2000   BREAST LUMPECTOMY WITH NEEDLE LOCALIZATION AND AXILLARY SENTINEL LYMPH NODE BX Left 03/06/2014   Procedure: BREAST LUMPECTOMY WITH NEEDLE LOCALIZATION AND AXILLARY SENTINEL LYMPH NODE BIOPSY;  Surgeon: Kandis Cocking, MD;  Location: [facility];  Service: General;  Laterality: Left;   BREAST SURGERY  2002   lumpectomy on right side   CESAREAN SECTION     MASTOPEXY Left 09/07/2017   Procedure: LEFT BREAST MASTOPEXY;  Surgeon: Glenna Fellows, MD;  Location: [facility];  Service: Plastics;  Laterality: Left;   PORT-A-CATH REMOVAL Right 08/03/2014   Procedure: REMOVAL PORT-A-CATH;  Surgeon: Avel Peace, MD;  Location: [facility];  Service: General;  Laterality: Right;   PORTACATH PLACEMENT Right 03/06/2014   Procedure: INSERTION PORT-A-CATH;  Surgeon: Kandis Cocking, MD;  Location: [facility];  Service: General;  Laterality: Right;  subclavian   ROTATOR CUFF REPAIR Right 02/04/2021   TONSILLECTOMY       Outpatient Encounter Medications as of 09/02/2023  Medication Sig Note   atorvastatin (LIPITOR) 40 MG tablet Take 40 mg by mouth daily. 08/31/2016: Received from: External Pharmacy Received Sig: TAKE 1 TABLET EVERY DAY   empagliflozin (JARDIANCE) 25 MG TABS tablet Take 25 mg by mouth daily.    sertraline (ZOLOFT) 100 MG tablet Take 1 tablet (100 mg total) by mouth daily. 08/01/2014: -   Vitamin D, Ergocalciferol, (DRISDOL) 1.25 MG (50000 UNIT) CAPS capsule TAKE 1 CAPSULE (50,000 UNITS TOTAL) BY MOUTH EVERY 7 (SEVEN) DAYS    No facility-administered encounter medications on file as of 09/02/2023.     Today's Vitals   09/02/23 1107  BP: 108/74  Pulse: 98  Resp: 16  Temp: 100.3 F (37.9 C)  TempSrc: Oral  SpO2: 94%  Weight: 161 lb 6.4 oz (73.2 kg)  Height: 5\' 7"  (1.702 m)    Body mass index is 25.28 kg/m.   PHYSICAL EXAM GENERAL:alert, no distress and comfortable SKIN: no rash  EYES: sclera clear NECK: without mass LYMPH:  no palpable cervical or supraclavicular lymphadenopathy  LUNGS: normal breathing effort HEART:  no lower extremity edema ABDOMEN: abdomen soft, non-tender and normal bowel sounds NEURO: alert & oriented x 3 with fluent speech, no focal motor/sensory deficits Breast exam: S/p bilateral lumpectomies, incisions healed.  Scar tissue at the right incision.  No other palpable mass or nodularity in either breast or axilla that I could appreciate.   CBC    Component Value Date/Time   WBC 4.2 09/02/2023 1049   WBC 5.1 07/23/2021 1259   RBC 5.24 (H) 09/02/2023 1049   HGB 15.0 09/02/2023 1049   HGB 14.2 07/07/2017 1115   HCT 44.1 09/02/2023 1049   HCT 41.9 07/07/2017 1115   PLT 118 (L) 09/02/2023 1049   PLT 152 07/07/2017 1115   MCV 84.2 09/02/2023 1049   MCV 93.5 07/07/2017 1115   MCH 28.6 09/02/2023 1049   MCHC 34.0 09/02/2023 1049  RDW 15.9 (H) 09/02/2023 1049   RDW 13.4 07/07/2017 1115   LYMPHSABS 1.5 09/02/2023 1049   LYMPHSABS 0.8 (L) 07/07/2017 1115   MONOABS 0.3 09/02/2023 1049   MONOABS 0.3 07/07/2017 1115   EOSABS 0.2 09/02/2023 1049   EOSABS 0.1 07/07/2017 1115   BASOSABS 0.0 09/02/2023 1049   BASOSABS 0.0 07/07/2017 1115     CMP     Component Value Date/Time   NA 138 07/23/2022 1359   NA 142 07/07/2017 1115   K 4.2 07/23/2022 1359   K 3.8 07/07/2017 1115   CL 107 07/23/2022 1359   CO2 26 07/23/2022 1359   CO2 29 07/07/2017 1115   GLUCOSE 136 (H) 07/23/2022 1359   GLUCOSE 159 (H) 07/07/2017 1115   BUN 15 07/23/2022 1359   BUN 15.8 07/07/2017 1115   CREATININE 0.80 07/23/2022 1359   CREATININE 0.9 07/07/2017 1115   CALCIUM 9.7 07/23/2022 1359   CALCIUM 10.1 07/07/2017 1115   PROT 6.9 07/23/2022 1359   PROT 7.2 07/07/2017 1115   ALBUMIN 4.5 07/23/2022 1359   ALBUMIN 3.9 07/07/2017 1115   AST 34  07/23/2022 1359   AST 52 (H) 07/07/2017 1115   ALT 45 (H) 07/23/2022 1359   ALT 107 (H) 07/07/2017 1115   ALKPHOS 94 07/23/2022 1359   ALKPHOS 102 07/07/2017 1115   BILITOT 0.8 07/23/2022 1359   BILITOT 0.86 07/07/2017 1115   GFRNONAA >60 07/23/2022 1359   GFRAA >60 07/01/2020 1123     ASSESSMENT & PLAN:Iveth A Northrop is a 62 y.o. female with    1.Triple negative left breast cancer T1cN0 diagnosed 12-2013, history of stage II right breast cancer HR-/HER2+ in 2002 -H/o T2N0M0 right breast cancer 11-2000 at age 44, s/p right lumpectomy, 2 sentinel nodes and chemotherapy -H/o triple negative breast cancer in 2015, stage IA, s/p left lumpectomy and sentinel node evaluation 03/06/14, adjuvant chemotherapy completed in 09/10/2014, and adjuvant RT completed 12/11/14 -Ms. Valiente is clinically doing well.  Exam is benign, labs are stable.  Mild thrombocytopenia is noted, we discussed this and will continue monitoring.  Per patient a recent mammogram was benign, I will request the report from [facility]. -Overall no clinical concern for recurrence -continue annual surveillance   2. Congenital deafness -Communicates by reading/writing and sign language.    3. Genetics  -multiple genetic variants of unknown significance on genetic testing 01-2014    4. Hepatic steatosis and mild transaminitis -Her prior CT abdomen and liver MRI in 2015 showed hepatic stenosis and a 1.2cm triangular subcapsular medial segment of left hepatic lobe area of enhancement, which is unchanged since 2008, likely represent perfusion phenomena.  -She has some mild intermittent transaminitis since 2015, likely segments to hepatic steatosis, overall stable -stable, continue monitoring    5. Vitamin D Deficiency  -intermittently low -Level is pending from today.  If normal she will continue her OTC dose.  If low I will refill high-dose vitamin D   PLAN: -Labs reviewed, we will follow-up the pending vitamin D level - If normal,  continue her OTC dose.  If low I will refill high-dose vitamin D -Request mammogram report from [facility] -Continue annual breast cancer surveillance and other age-appropriate health maintenance -Follow-up here in 1 year, or sooner if needed    All questions were answered. The patient knows to call the clinic with any problems, questions or concerns. No barriers to learning were detected with the assistance of an ASL interpreter present.   Santiago Glad, NP-C 09/02/2023

## 2023-09-02 ENCOUNTER — Inpatient Hospital Stay: Payer: BC Managed Care – PPO | Admitting: Nurse Practitioner

## 2023-09-02 ENCOUNTER — Inpatient Hospital Stay: Payer: BC Managed Care – PPO | Attending: Nurse Practitioner

## 2023-09-02 ENCOUNTER — Other Ambulatory Visit: Payer: Self-pay

## 2023-09-02 ENCOUNTER — Encounter: Payer: Self-pay | Admitting: Nurse Practitioner

## 2023-09-02 VITALS — BP 108/74 | HR 98 | Temp 100.3°F | Resp 16 | Ht 67.0 in | Wt 161.4 lb

## 2023-09-02 DIAGNOSIS — E559 Vitamin D deficiency, unspecified: Secondary | ICD-10-CM | POA: Insufficient documentation

## 2023-09-02 DIAGNOSIS — D696 Thrombocytopenia, unspecified: Secondary | ICD-10-CM | POA: Insufficient documentation

## 2023-09-02 DIAGNOSIS — C50212 Malignant neoplasm of upper-inner quadrant of left female breast: Secondary | ICD-10-CM

## 2023-09-02 DIAGNOSIS — Z853 Personal history of malignant neoplasm of breast: Secondary | ICD-10-CM | POA: Insufficient documentation

## 2023-09-02 DIAGNOSIS — Z171 Estrogen receptor negative status [ER-]: Secondary | ICD-10-CM | POA: Diagnosis not present

## 2023-09-02 DIAGNOSIS — Z08 Encounter for follow-up examination after completed treatment for malignant neoplasm: Secondary | ICD-10-CM | POA: Diagnosis not present

## 2023-09-02 LAB — VITAMIN D 25 HYDROXY (VIT D DEFICIENCY, FRACTURES): Vit D, 25-Hydroxy: 39.97 ng/mL (ref 30–100)

## 2023-09-02 LAB — CMP (CANCER CENTER ONLY)
ALT: 43 U/L (ref 0–44)
AST: 27 U/L (ref 15–41)
Albumin: 4.4 g/dL (ref 3.5–5.0)
Alkaline Phosphatase: 106 U/L (ref 38–126)
Anion gap: 12 (ref 5–15)
BUN: 15 mg/dL (ref 6–20)
CO2: 21 mmol/L — ABNORMAL LOW (ref 22–32)
Calcium: 9.4 mg/dL (ref 8.9–10.3)
Chloride: 106 mmol/L (ref 98–111)
Creatinine: 0.83 mg/dL (ref 0.44–1.00)
GFR, Estimated: >60 mL/min (ref >60)
Glucose, Bld: 148 mg/dL — ABNORMAL HIGH (ref 70–99)
Potassium: 3.9 mmol/L (ref 3.5–5.1)
Sodium: 139 mmol/L (ref 135–145)
Total Bilirubin: 1 mg/dL (ref 0.3–1.2)
Total Protein: 7 g/dL (ref 6.5–8.1)

## 2023-09-02 LAB — CBC WITH DIFFERENTIAL (CANCER CENTER ONLY)
Abs Immature Granulocytes: 0.05 10*3/uL (ref 0.00–0.07)
Basophils Absolute: 0 10*3/uL (ref 0.0–0.1)
Basophils Relative: 1 %
Eosinophils Absolute: 0.2 10*3/uL (ref 0.0–0.5)
Eosinophils Relative: 4 %
HCT: 44.1 % (ref 36.0–46.0)
Hemoglobin: 15 g/dL (ref 12.0–15.0)
Immature Granulocytes: 1 %
Lymphocytes Relative: 36 %
Lymphs Abs: 1.5 10*3/uL (ref 0.7–4.0)
MCH: 28.6 pg (ref 26.0–34.0)
MCHC: 34 g/dL (ref 30.0–36.0)
MCV: 84.2 fL (ref 80.0–100.0)
Monocytes Absolute: 0.3 10*3/uL (ref 0.1–1.0)
Monocytes Relative: 8 %
Neutro Abs: 2.1 10*3/uL (ref 1.7–7.7)
Neutrophils Relative %: 50 %
Platelet Count: 118 10*3/uL — ABNORMAL LOW (ref 150–400)
RBC: 5.24 MIL/uL — ABNORMAL HIGH (ref 3.87–5.11)
RDW: 15.9 % — ABNORMAL HIGH (ref 11.5–15.5)
WBC Count: 4.2 10*3/uL (ref 4.0–10.5)
nRBC: 0 % (ref 0.0–0.2)

## 2023-09-28 DIAGNOSIS — E782 Mixed hyperlipidemia: Secondary | ICD-10-CM | POA: Diagnosis not present

## 2023-09-28 DIAGNOSIS — Z23 Encounter for immunization: Secondary | ICD-10-CM | POA: Diagnosis not present

## 2023-09-28 DIAGNOSIS — G620 Drug-induced polyneuropathy: Secondary | ICD-10-CM | POA: Diagnosis not present

## 2023-09-28 DIAGNOSIS — F411 Generalized anxiety disorder: Secondary | ICD-10-CM | POA: Diagnosis not present

## 2023-09-28 DIAGNOSIS — Z1389 Encounter for screening for other disorder: Secondary | ICD-10-CM | POA: Diagnosis not present

## 2023-09-28 DIAGNOSIS — Z0000 Encounter for general adult medical examination without abnormal findings: Secondary | ICD-10-CM | POA: Diagnosis not present

## 2023-09-28 DIAGNOSIS — L03019 Cellulitis of unspecified finger: Secondary | ICD-10-CM | POA: Diagnosis not present

## 2023-09-28 DIAGNOSIS — E1169 Type 2 diabetes mellitus with other specified complication: Secondary | ICD-10-CM | POA: Diagnosis not present

## 2023-09-28 DIAGNOSIS — Z853 Personal history of malignant neoplasm of breast: Secondary | ICD-10-CM | POA: Diagnosis not present

## 2023-10-05 ENCOUNTER — Encounter: Payer: Self-pay | Admitting: Internal Medicine

## 2024-08-30 ENCOUNTER — Other Ambulatory Visit: Payer: Self-pay | Admitting: Internal Medicine

## 2024-08-30 ENCOUNTER — Other Ambulatory Visit (HOSPITAL_COMMUNITY): Payer: Self-pay | Admitting: Internal Medicine

## 2024-08-30 DIAGNOSIS — R1013 Epigastric pain: Secondary | ICD-10-CM

## 2024-09-06 ENCOUNTER — Other Ambulatory Visit: Payer: Self-pay | Admitting: Nurse Practitioner

## 2024-09-06 DIAGNOSIS — E559 Vitamin D deficiency, unspecified: Secondary | ICD-10-CM

## 2024-09-06 DIAGNOSIS — C50212 Malignant neoplasm of upper-inner quadrant of left female breast: Secondary | ICD-10-CM

## 2024-09-06 NOTE — Assessment & Plan Note (Deleted)
 T1cN0 diagnosed 12-2013, history of stage II right breast cancer HR-/HER2+ in 2002 -H/o T2N0M0 right breast cancer 11-2000 at age 63, s/p right lumpectomy, 2 sentinel nodes and chemotherapy -H/o triple negative breast cancer in 2015, stage IA, s/p left lumpectomy and sentinel node evaluation 03/06/14, adjuvant chemotherapy completed in 09/10/2014, and adjuvant RT completed 12/11/14 -Ms. Coleson is clinically doing well.  Exam is benign, labs are stable.  Mild thrombocytopenia is noted, we discussed this and will continue monitoring.  Per patient a recent mammogram was benign, I will request the report from [facility]. -Overall no clinical concern for recurrence -continue annual surveillance

## 2024-09-06 NOTE — Progress Notes (Deleted)
 Patient Care Team: Ransom Other, MD as PCP - General (Internal Medicine) Camella Delroy SQUIBB, MD as Consulting Physician (Oncology) Patrcia Cough, MD as Consulting Physician (Radiation Oncology) Ethyl Lenis, MD as Consulting Physician (General Surgery)  Clinic Day:  09/06/2024  Referring physician: Ransom Other, MD  ASSESSMENT & PLAN:   Assessment & Plan: Breast cancer of upper-inner quadrant of left female breast [facility]) T1cN0 diagnosed 12-2013, history of stage II right breast cancer HR-/HER2+ in 2002 -H/o T2N0M0 right breast cancer 11-2000 at age 27, s/p right lumpectomy, 2 sentinel nodes and chemotherapy -H/o triple negative breast cancer in 2015, stage IA, s/p left lumpectomy and sentinel node evaluation 03/06/14, adjuvant chemotherapy completed in 09/10/2014, and adjuvant RT completed 12/11/14 -Regina Watts is clinically doing well.  Exam is benign, labs are stable.  Mild thrombocytopenia is noted, we discussed this and will continue monitoring.  Per patient a recent mammogram was benign, I will request the report from [facility]. -Overall no clinical concern for recurrence -continue annual surveillance      The patient understands the plans discussed today and is in agreement with them.  She knows to contact our office if she develops concerns prior to her next appointment.  I provided *** minutes of face-to-face time during this encounter and > 50% was spent counseling as documented under my assessment and plan.    Powell FORBES Lessen, NP  [facility] - A DEPT OF [facility] [address] [place] [address] Dept: [phone] Dept Fax: [phone]   No orders of the defined types were placed in this encounter.     CHIEF COMPLAINT:  CC: Triple negative left breast cancer; history of right breast cancer HER2 +  Current Treatment: Breast cancer surveillance  INTERVAL HISTORY:  Regina Watts is here today for repeat  clinical assessment.  She last saw Lacie, NP, on 09/02/2023.  She denies fevers or chills. She denies pain. Her appetite is good. Her weight {Weight change:10426}.  I have reviewed the past medical history, past surgical history, social history and family history with the patient and they are unchanged from previous note.  ALLERGIES:  is allergic to taxotere  [docetaxel ].  MEDICATIONS:  Current Outpatient Medications  Medication Sig Dispense Refill   atorvastatin (LIPITOR) 40 MG tablet Take 40 mg by mouth daily.  3   empagliflozin (JARDIANCE) 25 MG TABS tablet Take 25 mg by mouth daily.     sertraline  (ZOLOFT ) 100 MG tablet Take 1 tablet (100 mg total) by mouth daily. 30 tablet 3   Vitamin D , Ergocalciferol , (DRISDOL ) 1.25 MG (50000 UNIT) CAPS capsule TAKE 1 CAPSULE (50,000 UNITS TOTAL) BY MOUTH EVERY 7 (SEVEN) DAYS 12 capsule 2   No current facility-administered medications for this visit.    HISTORY OF PRESENT ILLNESS:   Oncology History  Breast cancer of upper-inner quadrant of left female breast (HCC)  12/2013 Initial Diagnosis   Triple negative malignant neoplasm of breast (HCC)   01/01/2014 Pathology Results   Breast, left, needle core biopsy, mass, 10 o'clock - INVASIVE DUCTAL CARCINOMA.   01/30/2014 Genetic Testing   PMS2 c.1309C>T, ATM c.8674G>A and ATM [phone] VUS's found on the Breast/Ovarian cancer panel.  The ATM [phone] VUS was identified as a mosaic result. The Breast/Ovarian gene panel offered by [facility] includes sequencing and rearrangement analysis for the following 21 genes:  ATM, BARD1, BRCA1, BRCA2, BRIP1, CDH1, CHEK2, EPCAM, FANCC, MLH1, MSH2, MSH6, NBN, PALB2, PMS2, PTEN, RAD51C, RAD51D, STK11,  TP53, and XRCC2.     UPDATE: PMS2 c.1309C>T VUS has been reclassified from a variant of uncertain significance to a likely benign variant based on a combination of sources, e.g., internal data, published literature, population databases and in silico models.   The updated report date is Apr 08, 2017.    03/06/2014 Surgery   S/p Lumpectomy by Dr. Ethyl at [facility].    03/06/2014 Pathology Results   1. Lymph node, sentinel, biopsy, Left axillary - ONE BENIGN LYMPH NODE WITH NO TUMOR SEEN (0/1). 2. Breast, lumpectomy, Left - INVASIVE GRADE II DUCTAL CARCINOMA SPANNING 1.8 CM IN GREATEST DIMENSION. - INVASIVE TUMOR IS EXTREMELY CLOSE (0.1 CM) TO POSTERIOR MARGIN ON INITIAL LUMPECTOMY SPECIMEN, PLEASE SEE SPECIMEN # 3 FOR FINAL MARGIN STATUS. - OTHER MARGINS ARE NEGATIVE. - SEE ONCOLOGY TEMPLATE. 3. Breast, excision, Left posterior margin - BENIGN BREAST PARENCHYMA. - NO TUMOR SEEN.   03/06/2014 Receptors her2   Estrogen receptor: negative. Progesterone receptor: negative. HER-2: negative   03/30/2014 - 04/14/2014 Chemotherapy   The patient had adjuvant chemotherapy treatment, Cytoxan  and Adriamycin  for 4 cycles then Taxol  for 12 weeks    10/22/2014 - 12/11/2014 Radiation Therapy    The patient saw Dr. Patrcia for radiation treatment. This is the current list of radiation treatment:  1.  The whole left breast was treated to 50.4 Gy in 28 fractions of 1.8 Gy 2.  The lumpectomy surgical site was treated to a total dose of 60.4 Gy with 5 additional fractions of 2 Gy   09/07/2017 Surgery   LEFT BREAST MASTOPEXY by Dr. Arelia   01/20/2018 Mammogram   IMPRESSION: No evidence of malignancy bilaterally. Bilateral lumpectomy changes   04/01/2020 Pathology Results   Breast, left, needle core biopsy, upper outer quadrant, 1 o'clock, 1cm from nipple - FAT NECROSIS WITH ASSOCIATED FIBROSIS. SEE NOTE - NEGATIVE FOR CARCINOMA       REVIEW OF SYSTEMS:   Constitutional: Denies fevers, chills or abnormal weight loss Eyes: Denies blurriness of vision Ears, nose, mouth, throat, and face: Denies mucositis or sore throat Respiratory: Denies cough, dyspnea or wheezes Cardiovascular: Denies palpitation, chest discomfort or lower extremity  swelling Gastrointestinal:  Denies nausea, heartburn or change in bowel habits Skin: Denies abnormal skin rashes Lymphatics: Denies new lymphadenopathy or easy bruising Neurological:Denies numbness, tingling or new weaknesses Behavioral/Psych: Mood is stable, no new changes  All other systems were reviewed with the patient and are negative.   VITALS:  There were no vitals taken for this visit.  Wt Readings from Last 3 Encounters:  09/02/23 161 lb 6.4 oz (73.2 kg)  07/23/22 158 lb 1.6 oz (71.7 kg)  07/23/21 163 lb 9.6 oz (74.2 kg)    There is no height or weight on file to calculate BMI.  Performance status (ECOG): {CHL ONC [id number]  PHYSICAL EXAM:   GENERAL:alert, no distress and comfortable SKIN: skin color, texture, turgor are normal, no rashes or significant lesions EYES: normal, Conjunctiva are pink and non-injected, sclera clear OROPHARYNX:no exudate, no erythema and lips, buccal mucosa, and tongue normal  NECK: supple, thyroid  normal size, non-tender, without nodularity LYMPH:  no palpable lymphadenopathy in the cervical, axillary or inguinal LUNGS: clear to auscultation and percussion with normal breathing effort HEART: regular rate & rhythm and no murmurs and no lower extremity edema ABDOMEN:abdomen soft, non-tender and normal bowel sounds Musculoskeletal:no cyanosis of digits and no clubbing  NEURO: alert & oriented x 3 with fluent speech, no focal motor/sensory deficits  LABORATORY DATA:  I have reviewed the data as listed    Component Value Date/Time   NA 139 09/02/2023 1049   NA 142 07/07/2017 1115   K 3.9 09/02/2023 1049   K 3.8 07/07/2017 1115   CL 106 09/02/2023 1049   CO2 21 (L) 09/02/2023 1049   CO2 29 07/07/2017 1115   GLUCOSE 148 (H) 09/02/2023 1049   GLUCOSE 159 (H) 07/07/2017 1115   BUN 15 09/02/2023 1049   BUN 15.8 07/07/2017 1115   CREATININE 0.83 09/02/2023 1049   CREATININE 0.9 07/07/2017 1115   CALCIUM 9.4 09/02/2023 1049   CALCIUM  10.1 07/07/2017 1115   PROT 7.0 09/02/2023 1049   PROT 7.2 07/07/2017 1115   ALBUMIN 4.4 09/02/2023 1049   ALBUMIN 3.9 07/07/2017 1115   AST 27 09/02/2023 1049   AST 52 (H) 07/07/2017 1115   ALT 43 09/02/2023 1049   ALT 107 (H) 07/07/2017 1115   ALKPHOS 106 09/02/2023 1049   ALKPHOS 102 07/07/2017 1115   BILITOT 1.0 09/02/2023 1049   BILITOT 0.86 07/07/2017 1115   GFRNONAA >60 09/02/2023 1049   GFRAA >60 07/01/2020 1123    No results found for: SPEP, UPEP  Lab Results  Component Value Date   WBC 4.2 09/02/2023   NEUTROABS 2.1 09/02/2023   HGB 15.0 09/02/2023   HCT 44.1 09/02/2023   MCV 84.2 09/02/2023   PLT 118 (L) 09/02/2023      Chemistry      Component Value Date/Time   NA 139 09/02/2023 1049   NA 142 07/07/2017 1115   K 3.9 09/02/2023 1049   K 3.8 07/07/2017 1115   CL 106 09/02/2023 1049   CO2 21 (L) 09/02/2023 1049   CO2 29 07/07/2017 1115   BUN 15 09/02/2023 1049   BUN 15.8 07/07/2017 1115   CREATININE 0.83 09/02/2023 1049   CREATININE 0.9 07/07/2017 1115      Component Value Date/Time   CALCIUM 9.4 09/02/2023 1049   CALCIUM 10.1 07/07/2017 1115   ALKPHOS 106 09/02/2023 1049   ALKPHOS 102 07/07/2017 1115   AST 27 09/02/2023 1049   AST 52 (H) 07/07/2017 1115   ALT 43 09/02/2023 1049   ALT 107 (H) 07/07/2017 1115   BILITOT 1.0 09/02/2023 1049   BILITOT 0.86 07/07/2017 1115       RADIOGRAPHIC STUDIES: I have personally reviewed the radiological images as listed and agreed with the findings in the report. No results found.

## 2024-09-07 ENCOUNTER — Inpatient Hospital Stay: Payer: BC Managed Care – PPO

## 2024-09-07 ENCOUNTER — Inpatient Hospital Stay: Payer: BC Managed Care – PPO | Admitting: Nurse Practitioner

## 2024-09-07 DIAGNOSIS — C50212 Malignant neoplasm of upper-inner quadrant of left female breast: Secondary | ICD-10-CM

## 2024-09-12 ENCOUNTER — Ambulatory Visit (HOSPITAL_COMMUNITY)
Admission: RE | Admit: 2024-09-12 | Discharge: 2024-09-12 | Disposition: A | Discharge status: Home / Self Care | Source: Ambulatory Visit | Attending: Internal Medicine | Admitting: Internal Medicine

## 2024-09-12 DIAGNOSIS — R1013 Epigastric pain: Secondary | ICD-10-CM | POA: Diagnosis present

## 2024-10-04 ENCOUNTER — Encounter: Payer: Self-pay | Admitting: Internal Medicine

## 2024-11-22 ENCOUNTER — Other Ambulatory Visit: Payer: Self-pay

## 2024-11-22 DIAGNOSIS — E559 Vitamin D deficiency, unspecified: Secondary | ICD-10-CM

## 2024-11-22 DIAGNOSIS — C50212 Malignant neoplasm of upper-inner quadrant of left female breast: Secondary | ICD-10-CM

## 2024-11-23 ENCOUNTER — Inpatient Hospital Stay: Attending: Hematology

## 2024-11-23 ENCOUNTER — Inpatient Hospital Stay: Admitting: Hematology

## 2024-11-23 ENCOUNTER — Other Ambulatory Visit: Payer: Self-pay

## 2024-11-23 ENCOUNTER — Inpatient Hospital Stay

## 2024-11-23 VITALS — BP 112/60 | HR 89 | Temp 98.2°F | Resp 17 | Wt 136.9 lb

## 2024-11-23 DIAGNOSIS — Z79899 Other long term (current) drug therapy: Secondary | ICD-10-CM | POA: Diagnosis not present

## 2024-11-23 DIAGNOSIS — E559 Vitamin D deficiency, unspecified: Secondary | ICD-10-CM | POA: Diagnosis not present

## 2024-11-23 DIAGNOSIS — D61818 Other pancytopenia: Secondary | ICD-10-CM

## 2024-11-23 DIAGNOSIS — C50212 Malignant neoplasm of upper-inner quadrant of left female breast: Secondary | ICD-10-CM

## 2024-11-23 DIAGNOSIS — D696 Thrombocytopenia, unspecified: Secondary | ICD-10-CM | POA: Diagnosis not present

## 2024-11-23 DIAGNOSIS — Z853 Personal history of malignant neoplasm of breast: Secondary | ICD-10-CM | POA: Diagnosis not present

## 2024-11-23 DIAGNOSIS — Z9221 Personal history of antineoplastic chemotherapy: Secondary | ICD-10-CM | POA: Diagnosis not present

## 2024-11-23 DIAGNOSIS — E785 Hyperlipidemia, unspecified: Secondary | ICD-10-CM | POA: Insufficient documentation

## 2024-11-23 DIAGNOSIS — Z171 Estrogen receptor negative status [ER-]: Secondary | ICD-10-CM | POA: Diagnosis not present

## 2024-11-23 DIAGNOSIS — Z923 Personal history of irradiation: Secondary | ICD-10-CM | POA: Insufficient documentation

## 2024-11-23 DIAGNOSIS — D649 Anemia, unspecified: Secondary | ICD-10-CM | POA: Insufficient documentation

## 2024-11-23 LAB — IRON AND IRON BINDING CAPACITY (CC-WL,HP ONLY)
Iron: 27 ug/dL — ABNORMAL LOW (ref 28–170)
Saturation Ratios: 9 % — ABNORMAL LOW (ref 10.4–31.8)
TIBC: 291 ug/dL (ref 250–450)
UIBC: 265 ug/dL

## 2024-11-23 LAB — CBC WITH DIFFERENTIAL (CANCER CENTER ONLY)
Abs Immature Granulocytes: 0.1 K/uL — ABNORMAL HIGH (ref 0.00–0.07)
Band Neutrophils: 6 %
Basophils Absolute: 0 K/uL (ref 0.0–0.1)
Basophils Relative: 0 %
Eosinophils Absolute: 0 K/uL (ref 0.0–0.5)
Eosinophils Relative: 0 %
HCT: 29.6 % — ABNORMAL LOW (ref 36.0–46.0)
Hemoglobin: 9.4 g/dL — ABNORMAL LOW (ref 12.0–15.0)
Lymphocytes Relative: 46 %
Lymphs Abs: 1.2 K/uL (ref 0.7–4.0)
MCH: 26.3 pg (ref 26.0–34.0)
MCHC: 31.8 g/dL (ref 30.0–36.0)
MCV: 82.9 fL (ref 80.0–100.0)
Metamyelocytes Relative: 5 %
Monocytes Absolute: 0.8 K/uL (ref 0.1–1.0)
Monocytes Relative: 28 %
Neutro Abs: 0.6 K/uL — ABNORMAL LOW (ref 1.7–7.7)
Neutrophils Relative %: 15 %
Platelet Count: 129 K/uL — ABNORMAL LOW (ref 150–400)
RBC: 3.57 MIL/uL — ABNORMAL LOW (ref 3.87–5.11)
RDW: 19.6 % — ABNORMAL HIGH (ref 11.5–15.5)
WBC Count: 2.7 K/uL — ABNORMAL LOW (ref 4.0–10.5)
nRBC: 0 % (ref 0.0–0.2)

## 2024-11-23 LAB — CMP (CANCER CENTER ONLY)
ALT: 11 U/L (ref 0–44)
AST: 15 U/L (ref 15–41)
Albumin: 4.1 g/dL (ref 3.5–5.0)
Alkaline Phosphatase: 124 U/L (ref 38–126)
Anion gap: 12 (ref 5–15)
BUN: 12 mg/dL (ref 8–23)
CO2: 26 mmol/L (ref 22–32)
Calcium: 9.4 mg/dL (ref 8.9–10.3)
Chloride: 102 mmol/L (ref 98–111)
Creatinine: 0.77 mg/dL (ref 0.44–1.00)
GFR, Estimated: >60 mL/min
Glucose, Bld: 134 mg/dL — ABNORMAL HIGH (ref 70–99)
Potassium: 3.6 mmol/L (ref 3.5–5.1)
Sodium: 140 mmol/L (ref 135–145)
Total Bilirubin: 1.1 mg/dL (ref 0.0–1.2)
Total Protein: 7.2 g/dL (ref 6.5–8.1)

## 2024-11-23 LAB — VITAMIN D 25 HYDROXY (VIT D DEFICIENCY, FRACTURES): Vit D, 25-Hydroxy: 37.5 ng/mL (ref 30–100)

## 2024-11-23 LAB — RETIC PANEL
Immature Retic Fract: 10.8 % (ref 2.3–15.9)
RBC.: 3.79 MIL/uL — ABNORMAL LOW (ref 3.87–5.11)
Retic Count, Absolute: 50.8 K/uL (ref 19.0–186.0)
Retic Ct Pct: 1.3 % (ref 0.4–3.1)
Reticulocyte Hemoglobin: 27.6 pg — ABNORMAL LOW

## 2024-11-23 LAB — FERRITIN: Ferritin: 441 ng/mL — ABNORMAL HIGH (ref 11–307)

## 2024-11-23 LAB — LACTATE DEHYDROGENASE: LDH: 170 U/L (ref 105–235)

## 2024-11-23 LAB — VITAMIN B12: Vitamin B-12: 367 pg/mL (ref 180–914)

## 2024-11-23 LAB — FOLATE: Folate: 9.6 ng/mL

## 2024-11-23 NOTE — Assessment & Plan Note (Signed)
-   History of bilateral breast cancer  -T2N0M0 right breast cancer 11/2000 at age 64 treated with right lumpectomy and chemotherapy -triple negative left breast cancer diagnosed in 2015, stage IA, treated with left lumpectomy 03/06/14. S/p Adjuvant chemotherapy completed in 09/10/14 and adjuvant RT completed 12/11/14 -Continue breast surveillance and annual mammogram

## 2024-11-23 NOTE — Progress Notes (Signed)
 " [facility]   Telephone:(336) [phone] [phone]   Clinic Follow up Note   Patient Care Team: Ransom Other, MD as PCP - General (Internal Medicine) Camella Delroy SQUIBB, MD as Consulting Physician (Oncology) Patrcia Cough, MD as Consulting Physician (Radiation Oncology) Ethyl Lenis, MD as Consulting Physician (General Surgery)  Date of Service:  11/23/2024  CHIEF COMPLAINT: f/u of breast cancer  CURRENT THERAPY:  Surveillance  Oncology History   Breast cancer of upper-inner quadrant of left female breast [facility]) - History of bilateral breast cancer  -T2N0M0 right breast cancer 11/2000 at age 70 treated with right lumpectomy and chemotherapy -triple negative left breast cancer diagnosed in 2015, stage IA, treated with left lumpectomy 03/06/14. S/p Adjuvant chemotherapy completed in 09/10/14 and adjuvant RT completed 12/11/14 -Continue breast surveillance and annual mammogram  Assessment & Plan Pancytopenia She presents with new-onset pancytopenia: - Incidental findings on labs today, with WBC 2.7, ANC 0.6, hemoglobin 9.4 and platelet 127K . She remains asymptomatic, denying bleeding, gastrointestinal symptoms, or hepatic disease. Etiology is unclear; remote adriamycin  exposure may contribute, but the acute decline in hemoglobin is notable. Further diagnostic evaluation is indicated. - Ordered repeat complete blood count to confirm cytopenias. - Ordered laboratory studies for iron, vitamin B12, and folate levels. - Ordered abdominal ultrasound to assess liver and spleen. - Provided education regarding bone marrow biopsy, including sedation and outpatient procedure details. - Planned to review laboratory results at follow-up in two weeks. - Discussed possible bone marrow biopsy if cytopenias persist or etiology remains unclear after initial workup. - Instructed her to report new symptoms or if laboratory results are significantly abnormal, she may be contacted  sooner.  History of triple negative breast cancer, status post lumpectomy, in remission She is 10 years post-diagnosis and treatment for triple negative breast cancer, status post lumpectomy, with no evidence of recurrence. The palpable nodule at the surgical site is longstanding scar tissue, confirmed by prior evaluation and physical examination. Her risk of recurrence is now very low. - Reviewed recent mammogram and physical examination findings, confirming only scar tissue at the surgical site. - Reassured her regarding the low risk of recurrence for triple negative breast cancer after 5 years. - Advised discontinuation of oncology follow-up due to low recurrence risk. - Recommended continued surveillance with primary care physician.   Plan - Lab reviewed, new onset pancytopenia, will obtain additional lab workup today - Ultrasound of liver and spleen - Follow-up in 2 weeks to review the lab results, to determine if she needs a bone marrow biopsy.  SUMMARY OF ONCOLOGIC HISTORY: Oncology History  Breast cancer of upper-inner quadrant of left female breast (HCC)  12/2013 Initial Diagnosis   Triple negative malignant neoplasm of breast (HCC)   01/01/2014 Pathology Results   Breast, left, needle core biopsy, mass, 10 o'clock - INVASIVE DUCTAL CARCINOMA.   01/30/2014 Genetic Testing   PMS2 c.1309C>T, ATM c.8674G>A and ATM [phone] VUS's found on the Breast/Ovarian cancer panel.  The ATM [phone] VUS was identified as a mosaic result. The Breast/Ovarian gene panel offered by [facility] includes sequencing and rearrangement analysis for the following 21 genes:  ATM, BARD1, BRCA1, BRCA2, BRIP1, CDH1, CHEK2, EPCAM, FANCC, MLH1, MSH2, MSH6, NBN, PALB2, PMS2, PTEN, RAD51C, RAD51D, STK11, TP53, and XRCC2.     UPDATE: PMS2 c.1309C>T VUS has been reclassified from a variant of uncertain significance to a likely benign variant based on a combination of sources, e.g., internal data, published  literature, population databases and in silico models.  The updated report date is Apr 08, 2017.    03/06/2014 Surgery   S/p Lumpectomy by Dr. Ethyl at [facility].    03/06/2014 Pathology Results   1. Lymph node, sentinel, biopsy, Left axillary - ONE BENIGN LYMPH NODE WITH NO TUMOR SEEN (0/1). 2. Breast, lumpectomy, Left - INVASIVE GRADE II DUCTAL CARCINOMA SPANNING 1.8 CM IN GREATEST DIMENSION. - INVASIVE TUMOR IS EXTREMELY CLOSE (0.1 CM) TO POSTERIOR MARGIN ON INITIAL LUMPECTOMY SPECIMEN, PLEASE SEE SPECIMEN # 3 FOR FINAL MARGIN STATUS. - OTHER MARGINS ARE NEGATIVE. - SEE ONCOLOGY TEMPLATE. 3. Breast, excision, Left posterior margin - BENIGN BREAST PARENCHYMA. - NO TUMOR SEEN.   03/06/2014 Receptors her2   Estrogen receptor: negative. Progesterone receptor: negative. HER-2: negative   03/30/2014 - 04/14/2014 Chemotherapy   The patient had adjuvant chemotherapy treatment, Cytoxan  and Adriamycin  for 4 cycles then Taxol  for 12 weeks    10/22/2014 - 12/11/2014 Radiation Therapy    The patient saw Dr. Patrcia for radiation treatment. This is the current list of radiation treatment:  1.  The whole left breast was treated to 50.4 Gy in 28 fractions of 1.8 Gy 2.  The lumpectomy surgical site was treated to a total dose of 60.4 Gy with 5 additional fractions of 2 Gy   09/07/2017 Surgery   LEFT BREAST MASTOPEXY by Dr. Arelia   01/20/2018 Mammogram   IMPRESSION: No evidence of malignancy bilaterally. Bilateral lumpectomy changes   04/01/2020 Pathology Results   Breast, left, needle core biopsy, upper outer quadrant, 1 o'clock, 1cm from nipple - FAT NECROSIS WITH ASSOCIATED FIBROSIS. SEE NOTE - NEGATIVE FOR CARCINOMA      Discussed the use of AI scribe software for clinical note transcription with the patient, who gave verbal consent to proceed.  History of Present Illness Regina Watts is a 64 year old female with triple negative breast cancer in remission who presents for  evaluation of new-onset pancytopenia.  She was diagnosed with triple negative breast cancer in 2015 and treated with lumpectomy and adriamycin -based chemotherapy. She has no new breast symptoms. Her mammogram two months ago was normal. She notes a palpable nodule at the right breast surgical incision that was previously evaluated and attributed to scar tissue.  She has had significant unintentional weight loss and attributes this to [facility] for prediabetes. Her current weight is 136 pounds, down from a preferred weight near 150 pounds. Prior gastrointestinal symptoms while on Trulicity resolved after discontinuation. She denies current abdominal pain, gastrointestinal bleeding, melena, or hematochezia.  Recent labs showed new anemia with hemoglobin 9.4 (was 15 one year ago), thrombocytopenia with platelets 129, and leukopenia. She denies abnormal bleeding, bruising, or symptoms of gastrointestinal blood loss. She has no new systemic complaints. She denies significant alcohol  use and liver disease.     All other systems were reviewed with the patient and are negative.  MEDICAL HISTORY:  Past Medical History:  Diagnosis Date   Breast cancer (HCC) JANUARY 2002   RIGHT BREAST T4 SENTINEL NODE NEGATIVE, HER2 3+, ER/PR NEGATIVE   Breast cancer [facility]) February 2015   Left Breast T1N0   Congenital deafness    Hx antineoplastic chemo    Hx of radiation therapy    Hyperlipidemia    Personal history of radiation therapy    Wears glasses    to drive    SURGICAL HISTORY: Past Surgical History:  Procedure Laterality Date   AUGMENTATION MAMMAPLASTY Left    breast lift    BREAST LUMPECTOMY Left 03/06/2014   BREAST  LUMPECTOMY Right 11/2000   BREAST LUMPECTOMY WITH NEEDLE LOCALIZATION AND AXILLARY SENTINEL LYMPH NODE BX Left 03/06/2014   Procedure: BREAST LUMPECTOMY WITH NEEDLE LOCALIZATION AND AXILLARY SENTINEL LYMPH NODE BIOPSY;  Surgeon: Alm VEAR Angle, MD;  Location: [facility];   Service: General;  Laterality: Left;   BREAST SURGERY  2002   lumpectomy on right side   CESAREAN SECTION     MASTOPEXY Left 09/07/2017   Procedure: LEFT BREAST MASTOPEXY;  Surgeon: Arelia Filippo, MD;  Location: [facility];  Service: Plastics;  Laterality: Left;   PORT-A-CATH REMOVAL Right 08/03/2014   Procedure: REMOVAL PORT-A-CATH;  Surgeon: Krystal Russell, MD;  Location: [facility];  Service: General;  Laterality: Right;   PORTACATH PLACEMENT Right 03/06/2014   Procedure: INSERTION PORT-A-CATH;  Surgeon: Alm VEAR Angle, MD;  Location: [facility];  Service: General;  Laterality: Right;  subclavian   ROTATOR CUFF REPAIR Right 02/04/2021   TONSILLECTOMY      I have reviewed the social history and family history with the patient and they are unchanged from previous note.  ALLERGIES:  is allergic to taxotere  [docetaxel ].  MEDICATIONS:  Current Outpatient Medications  Medication Sig Dispense Refill   atorvastatin (LIPITOR) 40 MG tablet Take 40 mg by mouth daily.  3   empagliflozin (JARDIANCE) 25 MG TABS tablet Take 25 mg by mouth daily.     sertraline  (ZOLOFT ) 100 MG tablet Take 1 tablet (100 mg total) by mouth daily. 30 tablet 3   TRULICITY 0.75 MG/0.5ML SOAJ Inject 0.75 mg into the skin once a week.     Vitamin D , Ergocalciferol , (DRISDOL ) 1.25 MG (50000 UNIT) CAPS capsule TAKE 1 CAPSULE (50,000 UNITS TOTAL) BY MOUTH EVERY 7 (SEVEN) DAYS 12 capsule 2   No current facility-administered medications for this visit.    PHYSICAL EXAMINATION: ECOG PERFORMANCE STATUS: 1 - Symptomatic but completely ambulatory  Vitals:   11/23/24 1157 11/23/24 1200  BP: 112/60 112/60  Pulse: 89 89  Resp: 17 17  Temp: 98.2 F (36.8 C) 98.2 F (36.8 C)  SpO2: 95% 95%   Wt Readings from Last 3 Encounters:  11/23/24 136 lb 14.4 oz (62.1 kg)  09/02/23 161 lb 6.4 oz (73.2 kg)  07/23/22 158 lb 1.6 oz (71.7 kg)     GENERAL:alert, no distress and comfortable SKIN: skin  color, texture, turgor are normal, no rashes or significant lesions EYES: normal, Conjunctiva are pink and non-injected, sclera clear NECK: supple, thyroid  normal size, non-tender, without nodularity LYMPH:  no palpable lymphadenopathy in the cervical, axillary  LUNGS: clear to auscultation and percussion with normal breathing effort HEART: regular rate & rhythm and no murmurs and no lower extremity edema ABDOMEN:abdomen soft, non-tender and normal bowel sounds Musculoskeletal:no cyanosis of digits and no clubbing  NEURO: alert & oriented x 3 with fluent speech, no focal motor/sensory deficits Breasts: Breast inspection showed them to be symmetrical with no nipple discharge.  Status post bilateral lumpectomy.  There is multiple scar tissue with a 1 cm firm subcutaneous nodule at his incision line of right breast.  Palpation of the breasts and axilla revealed no other obvious mass that I could appreciate.   Physical Exam   LABORATORY DATA:  I have reviewed the data as listed    Latest Ref Rng & Units 11/23/2024   11:20 AM 09/02/2023   10:49 AM 07/23/2022    1:59 PM  CBC  WBC 4.0 - 10.5 K/uL 2.7  4.2  4.4   Hemoglobin 12.0 -  15.0 g/dL 9.4  84.9  84.8   Hematocrit 36.0 - 46.0 % 29.6  44.1  44.4   Platelets 150 - 400 K/uL 129  118  129         Latest Ref Rng & Units 11/23/2024   11:20 AM 09/02/2023   10:49 AM 07/23/2022    1:59 PM  CMP  Glucose 70 - 99 mg/dL 865  851  863   BUN 8 - 23 mg/dL 12  15  15    Creatinine 0.44 - 1.00 mg/dL 9.22  9.16  9.19   Sodium 135 - 145 mmol/L 140  139  138   Potassium 3.5 - 5.1 mmol/L 3.6  3.9  4.2   Chloride 98 - 111 mmol/L 102  106  107   CO2 22 - 32 mmol/L 26  21  26    Calcium 8.9 - 10.3 mg/dL 9.4  9.4  9.7   Total Protein 6.5 - 8.1 g/dL 7.2  7.0  6.9   Total Bilirubin 0.0 - 1.2 mg/dL 1.1  1.0  0.8   Alkaline Phos 38 - 126 U/L 124  106  94   AST 15 - 41 U/L 15  27  34   ALT 0 - 44 U/L 11  43  45       RADIOGRAPHIC STUDIES: I have  personally reviewed the radiological images as listed and agreed with the findings in the report. No results found.    Orders Placed This Encounter  Procedures   US  Abdomen Limited    Standing Status:   Future    Expected Date:   11/30/2024    Expiration Date:   11/23/2025    Reason for Exam (SYMPTOM  OR DIAGNOSIS REQUIRED):   pancytopenia, evluate liver and spleen    Preferred imaging location?:   [facility]   Ferritin    Standing Status:   Future    Number of Occurrences:   1    Expiration Date:   11/23/2025   CBC with Differential (Cancer Center Only)    Standing Status:   Future    Number of Occurrences:   1    Expiration Date:   11/23/2025   Iron and Iron Binding Capacity ([facility] only)    Standing Status:   Future    Number of Occurrences:   1    Expiration Date:   11/23/2025   Retic Panel    Standing Status:   Future    Number of Occurrences:   1    Expiration Date:   11/23/2025   Vitamin B12    Standing Status:   Future    Number of Occurrences:   1    Expiration Date:   11/23/2025   Heavy metals, blood    Standing Status:   Future    Number of Occurrences:   1    Expiration Date:   11/23/2025    [place] of residence?:   [place] [[id number]]   Haptoglobin    Standing Status:   Future    Number of Occurrences:   1    Expiration Date:   11/23/2025   Lactate dehydrogenase (LDH)    Standing Status:   Future    Number of Occurrences:   1    Expiration Date:   11/23/2025   Folate, Serum    Standing Status:   Future    Number of Occurrences:   1    Expiration Date:   11/23/2025   All questions  were answered. The patient knows to call the clinic with any problems, questions or concerns. No barriers to learning was detected. The total time spent in the appointment was 40 minutes, including review of chart and various tests results, discussions about plan of care and coordination of care plan     Onita Mattock, MD 11/23/2024     "

## 2024-11-24 ENCOUNTER — Other Ambulatory Visit: Payer: Self-pay

## 2024-11-24 LAB — HAPTOGLOBIN: Haptoglobin: 179 mg/dL (ref 37–355)

## 2024-11-26 LAB — HEAVY METALS, BLOOD
Arsenic: 3 ug/L (ref 0–9)
Lead: <1 ug/dL (ref 0.0–3.4)
Mercury: <1 ug/L (ref 0.0–14.9)

## 2024-11-30 ENCOUNTER — Ambulatory Visit (HOSPITAL_COMMUNITY): Admission: RE | Admit: 2024-11-30 | Source: Ambulatory Visit

## 2024-12-08 ENCOUNTER — Ambulatory Visit (HOSPITAL_COMMUNITY)
Admission: RE | Admit: 2024-12-08 | Discharge: 2024-12-08 | Disposition: A | Source: Ambulatory Visit | Attending: Hematology

## 2024-12-08 DIAGNOSIS — D61818 Other pancytopenia: Secondary | ICD-10-CM | POA: Insufficient documentation

## 2024-12-11 ENCOUNTER — Inpatient Hospital Stay: Admitting: Hematology

## 2024-12-11 ENCOUNTER — Inpatient Hospital Stay

## 2024-12-18 ENCOUNTER — Inpatient Hospital Stay: Attending: Hematology

## 2024-12-18 ENCOUNTER — Inpatient Hospital Stay: Admitting: Hematology
